# Patient Record
Sex: Male | Born: 1940 | Race: White | Hispanic: No | Marital: Married | Smoking: Former smoker
Health system: Southern US, Community
[De-identification: ages and names within clinical notes are randomized; demographics above are authoritative.]

## PROBLEM LIST (undated history)

## (undated) DIAGNOSIS — E876 Hypokalemia: Secondary | ICD-10-CM

## (undated) DIAGNOSIS — I82409 Acute embolism and thrombosis of unspecified deep veins of unspecified lower extremity: Secondary | ICD-10-CM

## (undated) DIAGNOSIS — M1990 Unspecified osteoarthritis, unspecified site: Secondary | ICD-10-CM

## (undated) DIAGNOSIS — H3530 Unspecified macular degeneration: Secondary | ICD-10-CM

## (undated) DIAGNOSIS — C7951 Secondary malignant neoplasm of bone: Secondary | ICD-10-CM

## (undated) DIAGNOSIS — H269 Unspecified cataract: Secondary | ICD-10-CM

## (undated) DIAGNOSIS — C7910 Secondary malignant neoplasm of unspecified urinary organs: Secondary | ICD-10-CM

## (undated) DIAGNOSIS — K219 Gastro-esophageal reflux disease without esophagitis: Secondary | ICD-10-CM

## (undated) DIAGNOSIS — I10 Essential (primary) hypertension: Secondary | ICD-10-CM

## (undated) DIAGNOSIS — N433 Hydrocele, unspecified: Secondary | ICD-10-CM

## (undated) HISTORY — PX: APPENDECTOMY: SHX54

## (undated) HISTORY — DX: Unspecified macular degeneration: H35.30

## (undated) HISTORY — DX: Hydrocele, unspecified: N43.3

## (undated) HISTORY — PX: OTHER SURGICAL HISTORY: SHX169

## (undated) HISTORY — DX: Hypokalemia: E87.6

## (undated) HISTORY — DX: Secondary malignant neoplasm of bone: C79.51

## (undated) HISTORY — DX: Unspecified cataract: H26.9

## (undated) HISTORY — PX: REPLACEMENT TOTAL KNEE: SUR1224

## (undated) HISTORY — DX: Unspecified osteoarthritis, unspecified site: M19.90

## (undated) HISTORY — PX: HAMMER TOE SURGERY: SHX385

## (undated) HISTORY — DX: Secondary malignant neoplasm of unspecified urinary organs: C79.10

## (undated) HISTORY — DX: Essential (primary) hypertension: I10

## (undated) HISTORY — DX: Gastro-esophageal reflux disease without esophagitis: K21.9

## (undated) HISTORY — DX: Acute embolism and thrombosis of unspecified deep veins of unspecified lower extremity: I82.409

---

## 2005-03-20 ENCOUNTER — Ambulatory Visit: Payer: Self-pay | Admitting: Internal Medicine

## 2005-03-31 ENCOUNTER — Ambulatory Visit: Payer: Self-pay | Admitting: Internal Medicine

## 2005-03-31 HISTORY — PX: COLONOSCOPY: SHX174

## 2009-01-20 ENCOUNTER — Ambulatory Visit (HOSPITAL_COMMUNITY): Admission: RE | Admit: 2009-01-20 | Discharge: 2009-01-20 | Payer: Self-pay | Admitting: Family Medicine

## 2009-06-08 ENCOUNTER — Inpatient Hospital Stay (HOSPITAL_COMMUNITY): Admission: RE | Admit: 2009-06-08 | Discharge: 2009-06-11 | Payer: Self-pay | Admitting: Orthopaedic Surgery

## 2009-06-28 ENCOUNTER — Encounter (HOSPITAL_COMMUNITY): Admission: RE | Admit: 2009-06-28 | Discharge: 2009-07-28 | Payer: Self-pay | Admitting: Orthopaedic Surgery

## 2009-07-29 ENCOUNTER — Encounter (HOSPITAL_COMMUNITY): Admission: RE | Admit: 2009-07-29 | Discharge: 2009-08-28 | Payer: Self-pay | Admitting: Orthopaedic Surgery

## 2010-04-19 ENCOUNTER — Encounter (INDEPENDENT_AMBULATORY_CARE_PROVIDER_SITE_OTHER): Payer: Self-pay | Admitting: *Deleted

## 2010-06-21 NOTE — Letter (Signed)
Summary: Colonoscopy Letter  [facility]  [address] [place], [place] [address]   Phone: [phone]  Fax: [phone]      April 19, 2010 MRN: [id number]   WALI REINHEIMER [address] [place], [place]  [address]   Dear Mr. ELLINGTONJR,   According to your medical record, it is time for you to schedule a Colonoscopy. The American Cancer Society recommends this procedure as a method to detect early colon cancer. Patients with a family history of colon cancer, or a personal history of colon polyps or inflammatory bowel disease are at increased risk.  This letter has been generated based on the recommendations made at the time of your procedure. If you feel that in your particular situation this may no longer apply, please contact our office.  Please call our office at [phone] to schedule this appointment or to update your records at your earliest convenience.  Thank you for cooperating with [place] to provide you with the very best care possible.   Sincerely,   Iva Boop, M.D.  [facility]ology Division [phone]

## 2010-08-07 LAB — BASIC METABOLIC PANEL
CO2: 26 mEq/L (ref 19–32)
Chloride: 100 mEq/L (ref 96–112)
Glucose, Bld: 111 mg/dL — ABNORMAL HIGH (ref 70–99)
Potassium: 3.3 mEq/L — ABNORMAL LOW (ref 3.5–5.1)

## 2010-08-07 LAB — CBC
MCHC: 34.4 g/dL (ref 30.0–36.0)
WBC: 8.1 10*3/uL (ref 4.0–10.5)

## 2010-08-08 LAB — BASIC METABOLIC PANEL
CO2: 27 mEq/L (ref 19–32)
CO2: 29 mEq/L (ref 19–32)
CO2: 29 mEq/L (ref 19–32)
Calcium: 8.1 mg/dL — ABNORMAL LOW (ref 8.4–10.5)
Calcium: 8.2 mg/dL — ABNORMAL LOW (ref 8.4–10.5)
Chloride: 99 mEq/L (ref 96–112)
Creatinine, Ser: 1.09 mg/dL (ref 0.4–1.5)
Creatinine, Ser: 1.31 mg/dL (ref 0.4–1.5)
GFR calc Af Amer: >60 mL/min (ref >60)
GFR calc Af Amer: >60 mL/min (ref >60)
Glucose, Bld: 119 mg/dL — ABNORMAL HIGH (ref 70–99)
Glucose, Bld: 131 mg/dL — ABNORMAL HIGH (ref 70–99)
Potassium: 3.5 mEq/L (ref 3.5–5.1)
Potassium: 3.7 mEq/L (ref 3.5–5.1)
Sodium: 133 mEq/L — ABNORMAL LOW (ref 135–145)
Sodium: 134 mEq/L — ABNORMAL LOW (ref 135–145)

## 2010-08-08 LAB — CBC
HCT: 31.5 % — ABNORMAL LOW (ref 39.0–52.0)
Hemoglobin: 12.2 g/dL — ABNORMAL LOW (ref 13.0–17.0)
MCHC: 33.4 g/dL (ref 30.0–36.0)
MCHC: 34.3 g/dL (ref 30.0–36.0)
Platelets: 254 10*3/uL (ref 150–400)
RDW: 13.7 % (ref 11.5–15.5)
WBC: 10 10*3/uL (ref 4.0–10.5)
WBC: 9.9 10*3/uL (ref 4.0–10.5)

## 2010-08-08 LAB — PROTIME-INR
INR: 1.3 (ref 0.00–1.49)
INR: 1.54 — ABNORMAL HIGH (ref 0.00–1.49)
INR: 1.6 — ABNORMAL HIGH (ref 0.00–1.49)
Prothrombin Time: 18.4 seconds — ABNORMAL HIGH (ref 11.6–15.2)
Prothrombin Time: 18.9 seconds — ABNORMAL HIGH (ref 11.6–15.2)

## 2010-08-23 ENCOUNTER — Ambulatory Visit (HOSPITAL_COMMUNITY): Payer: Self-pay | Admitting: Physical Therapy

## 2010-09-06 ENCOUNTER — Ambulatory Visit (HOSPITAL_COMMUNITY): Payer: Self-pay | Admitting: Physical Therapy

## 2010-11-11 ENCOUNTER — Other Ambulatory Visit (HOSPITAL_COMMUNITY): Payer: Self-pay | Admitting: Family Medicine

## 2010-11-11 DIAGNOSIS — M869 Osteomyelitis, unspecified: Secondary | ICD-10-CM

## 2010-11-15 ENCOUNTER — Ambulatory Visit (HOSPITAL_COMMUNITY)
Admission: RE | Admit: 2010-11-15 | Discharge: 2010-11-15 | Disposition: A | Discharge status: Home / Self Care | Payer: Medicare Other | Source: Ambulatory Visit | Attending: Family Medicine | Admitting: Family Medicine

## 2010-11-15 ENCOUNTER — Other Ambulatory Visit (HOSPITAL_COMMUNITY): Payer: Self-pay | Admitting: Family Medicine

## 2010-11-15 ENCOUNTER — Ambulatory Visit (HOSPITAL_COMMUNITY): Admission: RE | Admit: 2010-11-15 | Payer: Medicare Other | Source: Ambulatory Visit

## 2010-11-15 DIAGNOSIS — M869 Osteomyelitis, unspecified: Secondary | ICD-10-CM

## 2010-11-15 DIAGNOSIS — M79609 Pain in unspecified limb: Secondary | ICD-10-CM | POA: Insufficient documentation

## 2010-11-15 DIAGNOSIS — M7989 Other specified soft tissue disorders: Secondary | ICD-10-CM | POA: Insufficient documentation

## 2010-11-17 ENCOUNTER — Encounter (HOSPITAL_BASED_OUTPATIENT_CLINIC_OR_DEPARTMENT_OTHER)
Admission: RE | Admit: 2010-11-17 | Discharge: 2010-11-17 | Disposition: A | Discharge status: Home / Self Care | Payer: Medicare Other | Source: Ambulatory Visit | Attending: Orthopedic Surgery | Admitting: Orthopedic Surgery

## 2010-11-17 LAB — BASIC METABOLIC PANEL
BUN: 14 mg/dL (ref 6–23)
CO2: 24 mEq/L (ref 19–32)
Calcium: 8 mg/dL — ABNORMAL LOW (ref 8.4–10.5)
Chloride: 104 mEq/L (ref 96–112)
Creatinine, Ser: 1.3 mg/dL (ref 0.50–1.35)
GFR calc Af Amer: >60 mL/min (ref >60)
GFR calc non Af Amer: 55 mL/min — ABNORMAL LOW (ref >60)
Glucose, Bld: 132 mg/dL — ABNORMAL HIGH (ref 70–99)
Potassium: 4.5 mEq/L (ref 3.5–5.1)
Sodium: 137 mEq/L (ref 135–145)

## 2010-11-21 ENCOUNTER — Ambulatory Visit (HOSPITAL_BASED_OUTPATIENT_CLINIC_OR_DEPARTMENT_OTHER)
Admission: RE | Admit: 2010-11-21 | Discharge: 2010-11-21 | Disposition: A | Discharge status: Home / Self Care | Payer: Medicare Other | Source: Ambulatory Visit | Attending: Orthopedic Surgery | Admitting: Orthopedic Surgery

## 2010-11-21 DIAGNOSIS — Z79899 Other long term (current) drug therapy: Secondary | ICD-10-CM | POA: Insufficient documentation

## 2010-11-21 DIAGNOSIS — L02619 Cutaneous abscess of unspecified foot: Secondary | ICD-10-CM | POA: Insufficient documentation

## 2010-11-21 DIAGNOSIS — I10 Essential (primary) hypertension: Secondary | ICD-10-CM | POA: Insufficient documentation

## 2010-11-21 DIAGNOSIS — L97509 Non-pressure chronic ulcer of other part of unspecified foot with unspecified severity: Secondary | ICD-10-CM | POA: Insufficient documentation

## 2010-11-21 DIAGNOSIS — F17200 Nicotine dependence, unspecified, uncomplicated: Secondary | ICD-10-CM | POA: Insufficient documentation

## 2010-11-21 DIAGNOSIS — J4489 Other specified chronic obstructive pulmonary disease: Secondary | ICD-10-CM | POA: Insufficient documentation

## 2010-11-21 DIAGNOSIS — J449 Chronic obstructive pulmonary disease, unspecified: Secondary | ICD-10-CM | POA: Insufficient documentation

## 2010-11-21 DIAGNOSIS — L03039 Cellulitis of unspecified toe: Secondary | ICD-10-CM | POA: Insufficient documentation

## 2010-11-21 DIAGNOSIS — E669 Obesity, unspecified: Secondary | ICD-10-CM | POA: Insufficient documentation

## 2010-11-26 NOTE — Op Note (Signed)
  Harry Franklin, METOYER NO.:  [id number]  MEDICAL RECORD NO.:  [id number]  LOCATION:                                 FACILITY:  PHYSICIAN:  Feliberto Gottron. Turner Daniels, M.D.   DATE OF BIRTH:  January 09, 1941  DATE OF PROCEDURE:  11/21/2010 DATE OF DISCHARGE:                              OPERATIVE REPORT   PREOPERATIVE DIAGNOSIS:  Chronic cellulitis and ulceration, left distal second toe.  POSTOPERATIVE DIAGNOSIS:  Chronic cellulitis and ulceration, left distal second toe.  PROCEDURE:  Left second toe DIP joint amputation.  SURGEON:  Feliberto Gottron. Turner Daniels, MD  FIRST ASSISTANT:  None.  ANESTHETIC:  General LMA.  ESTIMATED BLOOD LOSS:  Minimal.  FLUID REPLACEMENT:  500 mL of crystalloid.  DRAINS PLACED:  None.  TOURNIQUET TIME:  5 minutes.  INDICATIONS FOR PROCEDURE:  A 70 year old man with a left second toe that is about a centimeter longer and the great toe is a chronic distal ulceration with cellulitis and infection and fungal infection of the nail bed for a number of years.  He desires DIP joint amputation to get the toe down of the correct length, incubated of the chronic infections and ulcerations.  Risks and benefits of surgery were discussed, questions were answered.  DESCRIPTION OF PROCEDURE:  The patient was identified by armband, received preoperative Ancef in the holding area at the [facility], taken to operating room #8, appropriate anesthetic monitors were attached.  General LMA anesthesia induced with the patient in supine position.  Left foot was prepped and draped in sterile fashion from the toes to the ankle.  Time-out procedure was performed.  We began the operation by [facility] fashioning a toe tourniquet using the small finger from a #6 glove.  We then cut through the skin dorsally over the DIP joint and made a fishmouth incision performing the DIP joint amputation. The residual tissue was noted to be healthy with no sign of infection and it  looks like the DIP amputation and specimen had all the infection in it.  At this point, the tourniquet was removed, bleeders were cauterized with a bipolar.  Wound was irrigated out with normal saline solution and then closed using 3-0 nylon vertical mattress sutures x3.  A dressing of Xeroform, 4x4 dressing sponges, 1-inch Kerlix and Coban was then applied.  The patient was awakened, extubated, and taken to the recovery room and placed in a postoperative shoe.  He tolerated the procedure well.     Feliberto Gottron. Turner Daniels, M.D.     Ovid Curd  D:  11/21/2010  T:  11/21/2010  Job:  [id number]  Electronically Signed by Gean Birchwood M.D. on 11/26/2010 08:58:10 PM

## 2012-01-23 ENCOUNTER — Encounter: Payer: Self-pay | Admitting: Internal Medicine

## 2012-11-23 ENCOUNTER — Other Ambulatory Visit: Payer: Self-pay | Admitting: Family Medicine

## 2012-11-23 ENCOUNTER — Ambulatory Visit (INDEPENDENT_AMBULATORY_CARE_PROVIDER_SITE_OTHER): Payer: Medicare Other | Admitting: Family Medicine

## 2012-11-23 VITALS — BP 130/72 | HR 98 | Temp 99.0°F | Resp 24 | Ht 76.0 in | Wt 308.0 lb

## 2012-11-23 DIAGNOSIS — H409 Unspecified glaucoma: Secondary | ICD-10-CM

## 2012-11-23 DIAGNOSIS — L97509 Non-pressure chronic ulcer of other part of unspecified foot with unspecified severity: Secondary | ICD-10-CM

## 2012-11-23 DIAGNOSIS — G609 Hereditary and idiopathic neuropathy, unspecified: Secondary | ICD-10-CM

## 2012-11-23 DIAGNOSIS — L97521 Non-pressure chronic ulcer of other part of left foot limited to breakdown of skin: Secondary | ICD-10-CM

## 2012-11-23 MED ORDER — CLINDAMYCIN HCL 150 MG PO CAPS: 150.0000 mg | ORAL_CAPSULE | Freq: Three times a day (TID) | ORAL | Status: DC

## 2012-11-23 MED ORDER — AMOXICILLIN-POT CLAVULANATE 875-125 MG PO TABS: 1.0000 | ORAL_TABLET | Freq: Two times a day (BID) | ORAL | Status: DC

## 2012-11-23 NOTE — Patient Instructions (Signed)
Soak the foot once or twice daily in Epsom salts. Keep it clean and dressed.  See Dr. Everlene Other and your podiatrist back in the next week  Return if worse or if we can be of assistance.  We did do a culture from the foot to make sure it will respond to the antibiotics that we prescribed, and we will let you know the results of that if needed.  Go to your pharmacy and get the antibiotics and continue them. If he gets bad diarrhea stopped the clindamycin immediately. Then followup with your doctor.

## 2012-11-23 NOTE — Progress Notes (Signed)
Subjective: Patient is here with a sore place on the base of the says is a tiny hole in it which is draining a tiny bit. Apparently he has had this in the past and was treated with Augmentin and clindamycin. He saw the doctor a week or so ago who opened a little bit and said that it was healing. He goes to Dr. Everlene Other, as well as to a podiatrist. He denies being diabetic. He does have some loss of sensation in his feet. He is on blood pressure medicine.  Objective: No acute complaints except for the foot. His ankles are edematous. He has a ulceration on his left foot on the ball of the foot. It has a tiny hole in which is draining a tiny bit. Pulses diminished.  The wound was prepped with Betadine. It was debrided, removing some of the surrounding callus. I pared back until 2 holes could be seen. These have a little band of necrosis between the 2. This was all debrided until we had viable bleeding tissue. The actual ulcer was not as large as anticipated it being. It did have a bad odor. Cultures taken. The wound was dressed.  Assessment: Foot ulcer Peripheral neuropathy Probable peripheral vascular disease  Plan: See his primary care Dr. back. See his podiatrist back. Return here if needed. Place him on antibiotics. Results for orders placed in visit on 11/23/12  GLUCOSE, POCT (MANUAL RESULT ENTRY)      Result Value Range   POC Glucose 94  70 - 99 mg/dl  POCT GLYCOSYLATED HEMOGLOBIN (HGB A1C)      Result Value Range   Hemoglobin A1C 5.6

## 2012-11-25 LAB — WOUND CULTURE

## 2012-11-26 LAB — FERRITIN

## 2012-11-26 LAB — IRON AND TIBC

## 2013-01-09 DIAGNOSIS — L97529 Non-pressure chronic ulcer of other part of left foot with unspecified severity: Secondary | ICD-10-CM | POA: Insufficient documentation

## 2013-02-10 ENCOUNTER — Encounter: Payer: Self-pay | Admitting: Podiatrist

## 2013-02-10 DIAGNOSIS — L97529 Non-pressure chronic ulcer of other part of left foot with unspecified severity: Secondary | ICD-10-CM

## 2013-02-20 ENCOUNTER — Encounter: Payer: Self-pay | Admitting: Podiatrist

## 2013-02-20 ENCOUNTER — Ambulatory Visit (INDEPENDENT_AMBULATORY_CARE_PROVIDER_SITE_OTHER): Payer: Medicare Other | Admitting: Podiatrist

## 2013-02-20 VITALS — BP 133/65 | HR 73 | Temp 98.4°F | Resp 20

## 2013-02-20 DIAGNOSIS — L97509 Non-pressure chronic ulcer of other part of unspecified foot with unspecified severity: Secondary | ICD-10-CM

## 2013-02-20 DIAGNOSIS — L97529 Non-pressure chronic ulcer of other part of left foot with unspecified severity: Secondary | ICD-10-CM

## 2013-02-20 NOTE — Progress Notes (Signed)
Subjective:  Patient presents today for continued care of ulceration of left foot- submetatarsal 1.  Patient denies any new complaints.  Denies nausea, vomiting, fevers or chills.  Denies changes to ulceration.  Patient relates ulceration has been looking better to him however it is starting to be painful. Has not been applying topical medication to wound.  Has been wearing his tennis shoe with offloading insert  Objective:  Ulceration located submetarsal 1 left foot.  Measurements carried out today of 7mm x 5mm x 3mm depth. Red, granlar base noted post debridement. No redness, streaking or lymphingitis noted.  No probing to bone, no undermining, no active pus or pirulence noted.    Assessment:  Ulceration left submetatarsal 1- chronic remitting/relapsing  Plan: Discussed etiology, pathology, conservative vs. Surgical therapies and at this time office debridement was recommended  Ulcer was debrided and reactive hyperkeratoses and necrotic tissue was resected to the level of bleeding or viable tissue. No deep abscess, no erythema, no edema, no cellulitis, no odor was encountered.  Iodosorb and a sterile dressing was applied.  Patient was given instructions on offloading and dressing change/aftercare and was instructed to call immediately if any signs or symptoms of infection arise.

## 2013-02-20 NOTE — Patient Instructions (Signed)
Your ulcer has not completely healed.  Wear your air fracture boot/walker on your left foot when your foot is going to be on the floor or bearing weight.  Apply Iodosorb to your ulcer and cover with a dressing every day   Instructions for Wound Care  The most important step to healing a foot wound is to reduce the pressure on your foot - it is extremely important to stay off your foot as much as possible and wear the shoe/boot as instructed.  Cleanse your foot with saline wash or warm soapy water (dial antibacterial soap or similar).  Blot dry.  Apply prescribed medication to your wound and cover with gauze and a bandage.  May hold bandage in place with Coban (self sticky wrap), Ace bandage or tape.  You may find dressing supplies at your local [employer], [employer], drug store or medical supply store.  Your prescribed topical medication is :  Lodosorb Gel (once or twice daily depending on drainage)   If you notice any foul odor, increase in pain, pus, increased swelling, red streaks or generalized redness occurring in your foot or leg-Call our office immediately to be seen.  This may be a sign of a limb or life threatening infection that will need prompt attention.  Marlowe Aschoff, DPM  [facility]  [phone] [facility]

## 2013-03-13 ENCOUNTER — Encounter: Payer: Self-pay | Admitting: Podiatrist

## 2013-03-13 ENCOUNTER — Ambulatory Visit (INDEPENDENT_AMBULATORY_CARE_PROVIDER_SITE_OTHER): Payer: Medicare Other | Admitting: Podiatrist

## 2013-03-13 VITALS — BP 147/84 | HR 75 | Temp 99.2°F | Resp 20

## 2013-03-13 DIAGNOSIS — L97509 Non-pressure chronic ulcer of other part of unspecified foot with unspecified severity: Secondary | ICD-10-CM

## 2013-03-13 DIAGNOSIS — L97529 Non-pressure chronic ulcer of other part of left foot with unspecified severity: Secondary | ICD-10-CM

## 2013-03-13 NOTE — Patient Instructions (Signed)
Continue taking your antibiotic medications.  Continue using iodosorb  On your foot.  Call if you notice any further signs of infections!

## 2013-03-19 NOTE — Progress Notes (Signed)
Almir presents today for followup of ulceration sub-1 left foot. The area has remitting and relapsing in the past. Patient was recently in [place] and the foot became infected. He ended up at the hospital where he was put on antibiotics. He has been using Iodosorb and dressing the foot as well as staying off of it.  Objective. Her vascular status is unchanged. He does have an ulceration submetatarsal one of the left foot which has returned. No pus or purulence is seen at today's visit. No redness or streaking he can is noted. He does however have an open lesion measuring 4 mm in diameter 2 mm in depth. It has a red granular base and does appear to be healthy however it is continued to be present.  Assessment recurrent ulceration submetatarsal one  Plan: Removed all necrotic tissue and applied Iodosorb and a dry sterile compressive dressing. Discussed importance of staying off of this foot to allow it to heal. I will see him back in 2 weeks for followup if any problems arise prior to that visit he is instructed to call me immediately

## 2013-03-28 ENCOUNTER — Encounter: Payer: Self-pay | Admitting: Podiatrist

## 2013-03-28 ENCOUNTER — Ambulatory Visit (INDEPENDENT_AMBULATORY_CARE_PROVIDER_SITE_OTHER): Payer: Medicare Other | Admitting: Podiatrist

## 2013-03-28 VITALS — BP 156/82 | HR 73 | Temp 97.2°F | Resp 20 | Ht 77.0 in | Wt 310.0 lb

## 2013-03-28 DIAGNOSIS — L97509 Non-pressure chronic ulcer of other part of unspecified foot with unspecified severity: Secondary | ICD-10-CM

## 2013-03-28 DIAGNOSIS — L97529 Non-pressure chronic ulcer of other part of left foot with unspecified severity: Secondary | ICD-10-CM

## 2013-03-28 NOTE — Patient Instructions (Signed)
Continue using the iodosorb and using the wedge shoe.

## 2013-03-28 NOTE — Progress Notes (Signed)
Subjective: Harry Franklin presents today for followup of ulceration submetatarsal one of the left foot. He states is not hurting as long as he is in his wedge shoe. He has been applying Iodosorb as instructed. He denies any systemic signs of infection.  Objective: Prominent plantarflexed first metatarsal continues to be present with ulceration submetatarsal one left. Ulceration appears to be healing nicely no deep abscess is palpated. There is a slitlike ulceration present measuring 4 mm x 1 mm and it has a red granular base present. No infection noted no streaking or lymphangitis present.  Assessment: Ulceration submetatarsal one left  Plan: Debrided the necrotic tissue and applied Iodosorb and a dry sterile compressive dressing. Instructed the patient to continue in his wedge shoe and he is instructed to bring his tissue at the next visit. Also discussed [place] which history of is coming up in 12 days. He will read the scooter and use his wedge shoe during the trip. I will see him back in 5 days for debridement and to make sure everything was looking good. May want to prescribe him antibiotics just in case he needs it for his trip

## 2013-04-02 ENCOUNTER — Ambulatory Visit (INDEPENDENT_AMBULATORY_CARE_PROVIDER_SITE_OTHER): Payer: Medicare Other | Admitting: Podiatrist

## 2013-04-02 ENCOUNTER — Encounter: Payer: Self-pay | Admitting: Podiatrist

## 2013-04-02 VITALS — BP 138/78 | HR 83 | Resp 20

## 2013-04-02 DIAGNOSIS — L97509 Non-pressure chronic ulcer of other part of unspecified foot with unspecified severity: Secondary | ICD-10-CM

## 2013-04-02 DIAGNOSIS — L97529 Non-pressure chronic ulcer of other part of left foot with unspecified severity: Secondary | ICD-10-CM

## 2013-04-02 NOTE — Progress Notes (Signed)
Subjective: Harry Franklin presents today for followup of ulceration submetatarsal one of the left foot. He states is not hurting as long as he is in his wedge shoe. He has been applying Iodosorb as instructed. He denies any systemic signs of infection.  Objective: Prominent plantarflexed first metatarsal continues to be present with ulceration submetatarsal one left. Ulceration appears to be healing nicely . There is a slitlike ulceration present measuring 2 mm x 1 mm and it has a red granular base present. No infection noted no streaking or lymphangitis present.  Assessment: Ulceration submetatarsal one left  Plan: Debrided the necrotic tissue and applied Iodosorb and a dry sterile compressive dressing. Instructed the patient to continue in his wedge shoe.  I also offloaded his inserts for his athletic shoes. He will continue following wound care regimen. He will be seen back in 2 weeks for followup.

## 2013-04-02 NOTE — Patient Instructions (Signed)
Continue applying the iodosorb and a dressing as well as wearing your offloading shoe to [employer].  Watch for any redness or swelling but today it looks to be healing well!

## 2013-04-21 ENCOUNTER — Encounter: Payer: Self-pay | Admitting: Podiatrist

## 2013-04-25 ENCOUNTER — Ambulatory Visit (INDEPENDENT_AMBULATORY_CARE_PROVIDER_SITE_OTHER): Payer: Medicare Other | Admitting: Podiatrist

## 2013-04-25 ENCOUNTER — Encounter: Payer: Self-pay | Admitting: Podiatrist

## 2013-04-25 VITALS — BP 142/79 | HR 67 | Temp 96.3°F | Resp 24 | Ht 77.0 in | Wt 307.0 lb

## 2013-04-25 DIAGNOSIS — L97509 Non-pressure chronic ulcer of other part of unspecified foot with unspecified severity: Secondary | ICD-10-CM

## 2013-04-25 DIAGNOSIS — L97529 Non-pressure chronic ulcer of other part of left foot with unspecified severity: Secondary | ICD-10-CM

## 2013-04-25 NOTE — Progress Notes (Signed)
Subjective: Harry Franklin presents today for followup of ulceration submetatarsal one of the left foot. He states he went [place] and on the first night there he developed a fever and chills.  He was seen at the hospital there and was admitted for iv antibiotics for 4 days and oral antibiotics on discharge.  A CT scan was done which showed no evidence of bone infection.  He continues to have a small ulceration which will heal, then open back up and become infected with cellulitus.  He states he has been wearing his wedge shoe or his cam walker at all times.  Today he presents in a sneaker with accomidative insert.  He has been applying Iodosorb as instructed.   Objective: Prominent plantarflexed first metatarsal continues to be present with ulceration submetatarsal one left. Ulceration appears to be present and relapsed at 29mmx3mmx3mm depth.  No probing to bone noted.  No undermining, no drainage or odor, no abscess identified.    Assessment: Ulceration submetatarsal one left -- non healing with episodic cellulitus  Plan: Debrided the necrotic tissue and applied Iodosorb and a dry sterile compressive dressing. Instructed the patient to continue in his boot or wedge shoe at all times. I recommended an appointment with the wound center to see if they may be able to assist with healing.  A secondary option is to do surgery on the foot to dorsiflex the first metatarsal however, the ulcer would need to be healed in order for this to occur.  He will continue following wound care regimen. He will be seen back in 1 week  for follow-up unless he see's the wound center prior to that visit date.  Marlowe Aschoff, DPM

## 2013-04-29 ENCOUNTER — Telehealth: Payer: Self-pay | Admitting: *Deleted

## 2013-04-29 MED ORDER — CADEXOMER IODINE 0.9 % EX GEL: 1.0000 "application " | Freq: Every day | CUTANEOUS | Status: DC | PRN

## 2013-04-29 NOTE — Telephone Encounter (Signed)
Dr Irving Shows ordered refill pt's Iodosorb gel through [facility].  Faxed to [facility] [phone] with 5 additional refills.

## 2013-05-02 ENCOUNTER — Encounter: Payer: Self-pay | Admitting: Podiatrist

## 2013-05-02 ENCOUNTER — Ambulatory Visit (INDEPENDENT_AMBULATORY_CARE_PROVIDER_SITE_OTHER): Payer: Medicare Other

## 2013-05-02 ENCOUNTER — Ambulatory Visit (INDEPENDENT_AMBULATORY_CARE_PROVIDER_SITE_OTHER): Payer: Medicare Other | Admitting: Podiatrist

## 2013-05-02 VITALS — BP 159/88 | HR 79 | Resp 12

## 2013-05-02 DIAGNOSIS — M79672 Pain in left foot: Secondary | ICD-10-CM

## 2013-05-02 DIAGNOSIS — M216X9 Other acquired deformities of unspecified foot: Secondary | ICD-10-CM

## 2013-05-02 DIAGNOSIS — M2030 Hallux varus (acquired), unspecified foot: Secondary | ICD-10-CM

## 2013-05-02 DIAGNOSIS — M6240 Contracture of muscle, unspecified site: Secondary | ICD-10-CM

## 2013-05-02 DIAGNOSIS — L97529 Non-pressure chronic ulcer of other part of left foot with unspecified severity: Secondary | ICD-10-CM

## 2013-05-02 DIAGNOSIS — M79609 Pain in unspecified limb: Secondary | ICD-10-CM

## 2013-05-02 DIAGNOSIS — L97509 Non-pressure chronic ulcer of other part of unspecified foot with unspecified severity: Secondary | ICD-10-CM

## 2013-05-02 DIAGNOSIS — M2032 Hallux varus (acquired), left foot: Secondary | ICD-10-CM

## 2013-05-02 DIAGNOSIS — M216X2 Other acquired deformities of left foot: Secondary | ICD-10-CM

## 2013-05-02 NOTE — Patient Instructions (Signed)
Pre-Operative Instructions  Congratulations, you have decided to take an important step to improving your quality of life.  You can be assured that the doctors of [facility] will be with you every step of the way.  1. Plan to be at the surgery center/hospital at least 1 (one) hour prior to your scheduled time unless otherwise directed by the surgical center/hospital staff.  You must have a responsible adult accompany you, remain during the surgery and drive you home.  Make sure you have directions to the surgical center/hospital and know how to get there on time. 2. For hospital based surgery you will need to obtain a history and physical form from your family physician within 1 month prior to the date of surgery- we will give you a form for you primary physician.  3. We make every effort to accommodate the date you request for surgery.  There are however, times where surgery dates or times have to be moved.  We will contact you as soon as possible if a change in schedule is required.   4. No Aspirin/Ibuprofen for one week before surgery.  If you are on aspirin, any non-steroidal anti-inflammatory medications (Mobic, Aleve, Ibuprofen) you should stop taking it 7 days prior to your surgery.  You make take Tylenol  For pain prior to surgery.  5. Medications- If you are taking daily heart and blood pressure medications, seizure, reflux, allergy, asthma, anxiety, pain or diabetes medications, make sure the surgery center/hospital is aware before the day of surgery so they may notify you which medications to take or avoid the day of surgery. 6. No food or drink after midnight the night before surgery unless directed otherwise by surgical center/hospital staff. 7. No alcoholic beverages 24 hours prior to surgery.  No smoking 24 hours prior to or 24 hours after surgery. 8. Wear loose pants or shorts- loose enough to fit over bandages, boots, and casts. 9. No slip on shoes, sneakers are best. 10. Bring  your boot with you to the surgery center/hospital.  Also bring crutches or a walker if your physician has prescribed it for you.  If you do not have this equipment, it will be provided for you after surgery. 11. If you have not been contracted by the surgery center/hospital by the day before your surgery, call to confirm the date and time of your surgery. 12. Leave-time from work may vary depending on the type of surgery you have.  Appropriate arrangements should be made prior to surgery with your employer. 13. Prescriptions will be provided immediately following surgery by your doctor.  Have these filled as soon as possible after surgery and take the medication as directed. 14. Remove nail polish on the operative foot. 15. Wash the night before surgery.  The night before surgery wash the foot and leg well with the antibacterial soap provided and water paying special attention to beneath the toenails and in between the toes.  Rinse thoroughly with water and dry well with a towel.  Perform this wash unless told not to do so by your physician.  Enclosed: 1 Ice pack (please put in freezer the night before surgery)   1 Hibiclens skin cleaner   Pre-op Instructions  If you have any questions regarding the instructions, do not hesitate to call our office.  [place]: [address] [place], [place] [address] [phone]  [place]: [address] [place], [place] [address] [phone]  [place]: [address]  [place], [place] [address] [phone]  Dr. Richard   Tuchman DPM, Dr. Norman Regal DPM Dr. Richard Sikora DPM, Dr. M. Todd Hyatt DPM, Dr. Mitsy Owen DPM 

## 2013-05-02 NOTE — Progress Notes (Signed)
   Subjective:    Patient ID: Harry Franklin, male    DOB: 1941/04/04, 72 y.o.   MRN: [id number]  HPI Comments: '' LT FOOT IS A LITTLE SORE TODAY.''  Harry Franklin presents today for followup of ulceration submetatarsal of the left foot. He said that the little sore today. He denies any nausea, vomiting, fevers, chills, night sweats or signs of infection. He states he would like to get this foot fixed as soon as possible as his had 3 episodes where he's had to go to the hospital and he does not want to get this foot infected again.   Review of Systems     Objective:   Physical Exam  Neurovascular status unchanged with palpable pedal pulses and decreased neurological sensation to the plantar aspect of the left foot. The patient has prominent plantarflexed first metatarsal which is flexible in nature. 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 Minimal ulceration present measuring 2 mm in diameter with a red granular base is noted. No redness, no swelling, no streaking, no malodor, no signs of infection are present. Ulcer appears to be healing rapidly.      Assessment & Plan:  Assessment: Ulceration submetatarsal one left., Prominent plantarflexed first metatarsal, hallux malleus left, contracture of tendon left  Plan: The ulceration was debrided from all necrotic tissue. No sign of infection present. Iodosorb and addressed a compressive dressing applied. Short air fracture walker dispensed. Discussed surgical intervention including a dorsiflexed ray osteotomy and fusion of the first metatarsocuneiform joint. Also discussed fusion of the hallux as well as extensor tendon release left. I reviewed the x-rays with the patient and discussed with him the proposed surgery. I did however discuss that if the ulceration is still present we cannot move forward with the surgery. He will wear the short air fracture walker consistently and I will see him next  week to make sure the ulceration is stable. I also discussed with him the consent forms to have surgery at [facility] specialty surgery center. The patients questions were encouraged and answered to the best of my ability. Again he'll be seen back in one week for recheck of the foot to ensure that we can do surgery for him that Monday.  Harry Franklin DPM

## 2013-05-09 ENCOUNTER — Ambulatory Visit (INDEPENDENT_AMBULATORY_CARE_PROVIDER_SITE_OTHER): Payer: Medicare Other | Admitting: Podiatrist

## 2013-05-09 ENCOUNTER — Encounter: Payer: Self-pay | Admitting: Podiatrist

## 2013-05-09 VITALS — BP 160/88 | HR 78 | Temp 97.1°F | Resp 24 | Ht 77.0 in | Wt 320.0 lb

## 2013-05-09 DIAGNOSIS — M216X2 Other acquired deformities of left foot: Secondary | ICD-10-CM

## 2013-05-09 DIAGNOSIS — M216X9 Other acquired deformities of unspecified foot: Secondary | ICD-10-CM

## 2013-05-12 ENCOUNTER — Encounter: Payer: Self-pay | Admitting: Podiatrist

## 2013-05-12 DIAGNOSIS — M21549 Acquired clubfoot, unspecified foot: Secondary | ICD-10-CM

## 2013-05-12 DIAGNOSIS — M6240 Contracture of muscle, unspecified site: Secondary | ICD-10-CM

## 2013-05-12 DIAGNOSIS — M2030 Hallux varus (acquired), unspecified foot: Secondary | ICD-10-CM

## 2013-05-13 NOTE — Progress Notes (Signed)
Presents today for preoperative check of left foot prior to his scheduled surgery on Monday. He denies any changes in the foot.  Objective: Vascular status continues to be intact and he has significant decrease in sensation left. Ulceration appears to be healing nicely no redness, no swelling, no signs of infection are present.  Assessment: Preoperative check for ulcer healing prior to elective surgery  Plan: Discussed with Jade that he does need to continue wearing his boot through the weekend. He was.also given my phone number and I instructed him that if he develops any signs or symptoms of infection he is to call. We will proceed with the proposed surgery on Monday however I did discuss with him that I suspect any cellulitis or infectious process present we will cancel the surgery and wait Patient demonstrates an understanding of this conversation

## 2013-05-21 ENCOUNTER — Encounter: Payer: Self-pay | Admitting: Podiatrist

## 2013-05-21 ENCOUNTER — Ambulatory Visit (INDEPENDENT_AMBULATORY_CARE_PROVIDER_SITE_OTHER): Payer: Medicare Other | Admitting: Podiatrist

## 2013-05-21 ENCOUNTER — Ambulatory Visit (INDEPENDENT_AMBULATORY_CARE_PROVIDER_SITE_OTHER): Payer: Medicare Other

## 2013-05-21 VITALS — BP 145/86 | HR 82 | Resp 16

## 2013-05-21 DIAGNOSIS — Z9889 Other specified postprocedural states: Secondary | ICD-10-CM

## 2013-05-21 NOTE — Patient Instructions (Signed)
Your foot looks great-- continue keeping your foot dry and keep the dressing intact.  Call if you have any questions or complaints

## 2013-05-23 ENCOUNTER — Ambulatory Visit: Payer: Medicare Other | Admitting: Podiatrist

## 2013-05-23 NOTE — Progress Notes (Signed)
Subjective: Harry Franklin presents today one week status post surgery on his left foot. Date of surgery 05/12/2013. Patient relates he's been doing well, denies any nausea, vomiting, fevers, chills or night sweats. Denies any calf or leg pain or tenderness. States he's been wearing his boot as instructed. He denies any pain or tenderness to the foot. Has not required any pain medication in the last 3 days.  Objective: Excellent clinical appearance of the foot is seen. Incision sites are well coapted with sutures in place. No redness, no swelling, no streaking, no lymphangitis, no sign of infection present. Clinical appearance of the foot is also excellent. Ulceration appears healing submetatarsal one left.  Assessment: Status post foot surgery left 05/12/2013- dorsiflexory first metatarsal osteotomy with screw fixation, hallux IP fusion, extensor tendon lengthening.  Plan: Redressed the foot and a dry sterile compressive dressing. Instructed the patient to continue taking his antibiotic until finished. Instructed the patient to remove his foot from the boot and to do calf exercises at least 4 times daily. A stretcher the patient is day off of the foot as much as possible and to avoid weightbearing as much as possible. We'll see him back in one more week in sutures will be removed at that visit. If he has any problems or concerns or if he notices any redness, swelling or any indication that an infection or cellulitis may be present he is instructed to call me immediately.

## 2013-05-26 ENCOUNTER — Telehealth: Payer: Self-pay | Admitting: *Deleted

## 2013-05-26 NOTE — Telephone Encounter (Signed)
Pt states called Dr Valentina Lucks on Sunday after having severe pain that previous night, took 3 pain pill over the night.  He states Dr Valentina Lucks stated unwrap the foot and check for infection.  Pt states had no redness, or drainage, Dr Valentina Lucks ordered begin Warm Epsom salt soaks daily.  Pt states this morning, the big toe has a small area of redness, no fever or drainage.  I instructed pt to continue the antibiotic given and soaks, place a small amount of Neosporin ointment on the site after the soak.  I will advise Dr Valentina Lucks and call with any new orders.  I instructed pt to call again with concerns.  Pt states understanding.

## 2013-05-27 NOTE — Progress Notes (Signed)
1) Lapidus bunionectomy left foot  2) Halux IPJ fusion left foot 3) Possible extensor tendon lengthening (great toe) left foot

## 2013-05-29 ENCOUNTER — Encounter: Payer: Self-pay | Admitting: Podiatrist

## 2013-05-29 ENCOUNTER — Ambulatory Visit (INDEPENDENT_AMBULATORY_CARE_PROVIDER_SITE_OTHER): Payer: Medicare HMO | Admitting: Podiatrist

## 2013-05-29 ENCOUNTER — Ambulatory Visit (INDEPENDENT_AMBULATORY_CARE_PROVIDER_SITE_OTHER): Payer: Medicare HMO

## 2013-05-29 VITALS — BP 157/84 | HR 75 | Resp 20

## 2013-05-29 DIAGNOSIS — R52 Pain, unspecified: Secondary | ICD-10-CM

## 2013-05-29 MED ORDER — CEPHALEXIN 500 MG PO CAPS: 500.0000 mg | ORAL_CAPSULE | Freq: Three times a day (TID) | ORAL | Status: DC

## 2013-05-29 NOTE — Progress Notes (Signed)
Subjective: Harry Franklin presents today for 3 weeks postop check status post left foot surgery. He states over the weekend his foot became severely painful and he ended up taking more pain medication than he had taken right after surgery. He states he had redness of the great toe which has since subsided. He states he's been wearing his postop boot as instructed and he's seen no redness, no swelling, no pus, no purulence, and no calf pain or tenderness is noted.  Objective: Pedal pulses continue to be palpable and present left. Neurological sensation continues to be decreased as per his baseline left. Incision sites are well coapted with sutures in place that are removed at today's visit without complication. Swelling at the hallux is noted alignment and position of the hallux is within normal limits. Overall the foot looks great postoperatively. The ulceration still appears healed and no sign of infection is present.  X-rays are taken at today's visit and are normal. No lucency around the hallux IP joint screw and no bony changes are seen.   Assessment: Status post left foot surgery 3 weeks postop  Plan: Removed the sutures today without complication. Dispensed surgi-grip for the left foot instructions for wear. I will see him back. In 2 weeks and we will reevaluate the foot. At that time I will decide if he is ready for her Darco shoe or not any problems or concerns arise in the meantime he is instructed to contact me immediately.

## 2013-05-29 NOTE — Patient Instructions (Signed)
Keep wearing your boot--  You can get your foot wet but use a shower chair in the shower to keep weight off the toe area.  I'll see you again in 2 weeks.. Call if you have any trouble before then

## 2013-06-13 ENCOUNTER — Ambulatory Visit (INDEPENDENT_AMBULATORY_CARE_PROVIDER_SITE_OTHER): Payer: Medicare HMO | Admitting: Podiatrist

## 2013-06-13 ENCOUNTER — Encounter: Payer: Self-pay | Admitting: Podiatrist

## 2013-06-13 ENCOUNTER — Ambulatory Visit (INDEPENDENT_AMBULATORY_CARE_PROVIDER_SITE_OTHER): Payer: Medicare HMO

## 2013-06-13 VITALS — BP 160/90 | HR 81 | Resp 18

## 2013-06-13 DIAGNOSIS — Z9889 Other specified postprocedural states: Secondary | ICD-10-CM

## 2013-06-13 DIAGNOSIS — M216X2 Other acquired deformities of left foot: Secondary | ICD-10-CM

## 2013-06-13 DIAGNOSIS — M6240 Contracture of muscle, unspecified site: Secondary | ICD-10-CM

## 2013-06-13 DIAGNOSIS — Z09 Encounter for follow-up examination after completed treatment for conditions other than malignant neoplasm: Secondary | ICD-10-CM

## 2013-06-13 DIAGNOSIS — M216X9 Other acquired deformities of unspecified foot: Secondary | ICD-10-CM

## 2013-06-13 NOTE — Patient Instructions (Signed)
Wear your Darco (smaller shoe) when at home or a protected place.  If you are out and about and/or doing a significant amount of walking, wear your large boot still.  I'll see you again in 1 month.  If you have any troubles let me know!

## 2013-06-13 NOTE — Progress Notes (Signed)
Subjective: Harry Franklin presents today for his 2 month postop followup stating "I am doing good on my left foot and it has been close to 2 months now and the surgery was done on 05/12/13 and now I have spot on top of my foot that was not there before" he has a small abrasion on the dorsal aspect of the left foot most likely from the boot.  Objective: Excellent clinical appearance of the left foot is seen. Redness and swelling are completely subsided. The ulceration is healed on the plantar aspect of the left first metatarsal head. Overall excellent improvement on the left foot is seen both clinically and radiographically.  Assessment: Status post left foot surgery date of surgery 05/12/2013  Plan: Harry Franklin was put into a Darco shoe at today's visit. He was told to discontinue the use of the cephalexin. I will see him back in one month for followup. At that time hopefully he'll be ready for a tennis shoe. If he experiences any problems or concerns he is instructed to call.

## 2013-06-30 ENCOUNTER — Emergency Department (HOSPITAL_COMMUNITY)
Admission: EM | Admit: 2013-06-30 | Discharge: 2013-06-30 | Disposition: A | Discharge status: Home / Self Care | Payer: Medicare HMO | Attending: Emergency Medicine | Admitting: Emergency Medicine

## 2013-06-30 ENCOUNTER — Emergency Department (HOSPITAL_COMMUNITY): Payer: Medicare HMO

## 2013-06-30 ENCOUNTER — Encounter (HOSPITAL_COMMUNITY): Payer: Self-pay | Admitting: Emergency Medicine

## 2013-06-30 DIAGNOSIS — I10 Essential (primary) hypertension: Secondary | ICD-10-CM | POA: Insufficient documentation

## 2013-06-30 DIAGNOSIS — Z87891 Personal history of nicotine dependence: Secondary | ICD-10-CM | POA: Insufficient documentation

## 2013-06-30 DIAGNOSIS — M129 Arthropathy, unspecified: Secondary | ICD-10-CM | POA: Insufficient documentation

## 2013-06-30 DIAGNOSIS — R000 Tachycardia, unspecified: Secondary | ICD-10-CM | POA: Insufficient documentation

## 2013-06-30 DIAGNOSIS — K219 Gastro-esophageal reflux disease without esophagitis: Secondary | ICD-10-CM | POA: Insufficient documentation

## 2013-06-30 DIAGNOSIS — L819 Disorder of pigmentation, unspecified: Secondary | ICD-10-CM | POA: Insufficient documentation

## 2013-06-30 DIAGNOSIS — Z7982 Long term (current) use of aspirin: Secondary | ICD-10-CM | POA: Insufficient documentation

## 2013-06-30 DIAGNOSIS — R609 Edema, unspecified: Secondary | ICD-10-CM | POA: Insufficient documentation

## 2013-06-30 DIAGNOSIS — J111 Influenza due to unidentified influenza virus with other respiratory manifestations: Secondary | ICD-10-CM

## 2013-06-30 DIAGNOSIS — E876 Hypokalemia: Secondary | ICD-10-CM | POA: Insufficient documentation

## 2013-06-30 DIAGNOSIS — Z791 Long term (current) use of non-steroidal anti-inflammatories (NSAID): Secondary | ICD-10-CM | POA: Insufficient documentation

## 2013-06-30 DIAGNOSIS — R69 Illness, unspecified: Secondary | ICD-10-CM

## 2013-06-30 DIAGNOSIS — R509 Fever, unspecified: Secondary | ICD-10-CM

## 2013-06-30 DIAGNOSIS — Z792 Long term (current) use of antibiotics: Secondary | ICD-10-CM | POA: Insufficient documentation

## 2013-06-30 DIAGNOSIS — Z79899 Other long term (current) drug therapy: Secondary | ICD-10-CM | POA: Insufficient documentation

## 2013-06-30 DIAGNOSIS — H269 Unspecified cataract: Secondary | ICD-10-CM | POA: Insufficient documentation

## 2013-06-30 LAB — CBC WITH DIFFERENTIAL/PLATELET
BASOS ABS: 0 10*3/uL (ref 0.0–0.1)
BASOS PCT: 0 % (ref 0–1)
Eosinophils Absolute: 0.1 10*3/uL (ref 0.0–0.7)
Eosinophils Relative: 1 % (ref 0–5)
HEMATOCRIT: 41 % (ref 39.0–52.0)
Hemoglobin: 13.8 g/dL (ref 13.0–17.0)
Lymphocytes Relative: 7 % — ABNORMAL LOW (ref 12–46)
Lymphs Abs: 1 10*3/uL (ref 0.7–4.0)
MCH: 30.8 pg (ref 26.0–34.0)
MCHC: 33.7 g/dL (ref 30.0–36.0)
MCV: 91.5 fL (ref 78.0–100.0)
MONOS PCT: 7 % (ref 3–12)
Monocytes Absolute: 0.9 10*3/uL (ref 0.1–1.0)
NEUTROS ABS: 11 10*3/uL — AB (ref 1.7–7.7)
Neutrophils Relative %: 85 % — ABNORMAL HIGH (ref 43–77)
Platelets: 228 10*3/uL (ref 150–400)
RBC: 4.48 MIL/uL (ref 4.22–5.81)
RDW: 12.9 % (ref 11.5–15.5)
WBC: 13 10*3/uL — ABNORMAL HIGH (ref 4.0–10.5)

## 2013-06-30 LAB — URINE MICROSCOPIC-ADD ON

## 2013-06-30 LAB — URINALYSIS, ROUTINE W REFLEX MICROSCOPIC
BILIRUBIN URINE: NEGATIVE
GLUCOSE, UA: NEGATIVE mg/dL
KETONES UR: NEGATIVE mg/dL
Leukocytes, UA: NEGATIVE
Nitrite: NEGATIVE
PROTEIN: NEGATIVE mg/dL
Specific Gravity, Urine: 1.02 (ref 1.005–1.030)
Urobilinogen, UA: 0.2 mg/dL (ref 0.0–1.0)
pH: 5.5 (ref 5.0–8.0)

## 2013-06-30 LAB — INFLUENZA PANEL BY PCR (TYPE A & B)
H1N1 flu by pcr: NOT DETECTED
Influenza A By PCR: NEGATIVE
Influenza B By PCR: NEGATIVE

## 2013-06-30 LAB — BASIC METABOLIC PANEL
BUN: 21 mg/dL (ref 6–23)
CHLORIDE: 102 meq/L (ref 96–112)
CO2: 22 mEq/L (ref 19–32)
CREATININE: 1.45 mg/dL — AB (ref 0.50–1.35)
Calcium: 8.5 mg/dL (ref 8.4–10.5)
GFR calc Af Amer: 55 mL/min — ABNORMAL LOW (ref >90)
GFR calc non Af Amer: 47 mL/min — ABNORMAL LOW (ref >90)
Glucose, Bld: 107 mg/dL — ABNORMAL HIGH (ref 70–99)
Potassium: 3.8 mEq/L (ref 3.7–5.3)
Sodium: 138 mEq/L (ref 137–147)

## 2013-06-30 MED ORDER — OSELTAMIVIR PHOSPHATE 75 MG PO CAPS: 75.0000 mg | ORAL_CAPSULE | Freq: Two times a day (BID) | ORAL | Status: DC

## 2013-06-30 MED ORDER — ACETAMINOPHEN 500 MG PO TABS
1000.0000 mg | ORAL_TABLET | Freq: Once | ORAL | Status: AC
  Administered 2013-06-30: 1000 mg via ORAL
  Filled 2013-06-30: qty 2

## 2013-06-30 MED ORDER — NAPROXEN 500 MG PO TABS: 500.0000 mg | ORAL_TABLET | Freq: Two times a day (BID) | ORAL | Status: DC

## 2013-06-30 NOTE — Discharge Instructions (Signed)
Please call your doctor for a followup appointment within 24-48 hours. When you talk to your doctor please let them know that you were seen in the emergency department and have them acquire all of your records so that they can discuss the findings with you and formulate a treatment plan to fully care for your new and ongoing problems.  Take 500mg  of naprosyn twice daily for bodyaches and fevers  Take tylenol every 8 hours for bodyaches and fevers - this can be used with the naprosyn safely.

## 2013-06-30 NOTE — ED Provider Notes (Signed)
CSN: [id number]     Arrival date & time 06/30/13  0515 History   First MD Initiated Contact with Patient 06/30/13 0531     Chief Complaint  Patient presents with  . Fever    102 fever at home.  . Generalized Body Aches   (Consider location/radiation/quality/duration/timing/severity/associated sxs/prior Treatment) HPI Comments: 73 year old male with a history of hypertension, history of recent podiatry surgery to the bottom of the left foot for a nonhealing ulcer. He presents with a complaint of a fever, this has begun in the last 12 hours, is persistent, associated with myalgias, intermittent cough and nasal congestion but denies nausea vomiting dysuria or diarrhea today. He did have diarrhea over the weekend but this has resolved. He has had no medications prior to arrival. He does note that he recently had a cellulitis of his lower extremity which has healed and he feels that his skin is back to its baseline color. He did receive a flu shot this year. He describes as myalgias as diffuse, involving the neck, shoulders, back, legs.  Patient is a 73 y.o. male presenting with fever. The history is provided by the patient and a relative.  Fever   Past Medical History  Diagnosis Date  . GERD (gastroesophageal reflux disease)   . Hypokalemia   . Hypertension   . Cataract   . Arthritis    Past Surgical History  Procedure Laterality Date  . Replacement total knee    . Appendectomy    . Hammer toe surgery Left    History reviewed. No pertinent family history. History  Substance Use Topics  . Smoking status: Former Smoker    Quit date: 01/22/1984  . Smokeless tobacco: Not on file  . Alcohol Use: No    Review of Systems  Constitutional: Positive for fever.  All other systems reviewed and are negative.    Allergies  Vancomycin  Home Medications   Current Outpatient Rx  Name  Route  Sig  Dispense  Refill  . aspirin 325 MG tablet   Oral   Take 325 mg by mouth daily.          . cadexomer iodine (IODOSORB) 0.9 % gel   Topical   Apply 1 application topically daily as needed for wound care.   40 g   5   . cephALEXin (KEFLEX) 500 MG capsule   Oral   Take 1 capsule (500 mg total) by mouth 3 (three) times daily.   30 capsule   2   . doxycycline (VIBRA-TABS) 100 MG tablet   Oral   Take 100 mg by mouth 2 (two) times daily.          . furosemide (LASIX) 20 MG tablet   Oral   Take 20 mg by mouth.         . metoprolol (LOPRESSOR) 100 MG tablet               . Multiple Vitamins-Minerals (CENTRUM SILVER PO)   Oral   Take 1 tablet by mouth daily.         . naproxen (NAPROSYN) 500 MG tablet   Oral   Take 1 tablet (500 mg total) by mouth 2 (two) times daily with a meal.   30 tablet   0   . omeprazole (PRILOSEC) 40 MG capsule   Oral   Take 40 mg by mouth daily.         Marland Kitchen oseltamivir (TAMIFLU) 75 MG capsule   Oral  Take 1 capsule (75 mg total) by mouth every 12 (twelve) hours.   10 capsule   0   . potassium chloride SA (K-DUR,KLOR-CON) 20 MEQ tablet   Oral   Take 20 mEq by mouth once.          . Probiotic Product (ALIGN PO)   Oral   Take 1 tablet by mouth daily.          BP 136/69  Pulse 110  Temp(Src) 99.1 F (37.3 C) (Oral)  Resp 20  Ht 6\' 4"  (1.93 m)  Wt 330 lb (149.687 kg)  BMI 40.19 kg/m2  SpO2 93% Physical Exam  Nursing note and vitals reviewed. Constitutional: He appears well-developed and well-nourished. No distress.  HENT:  Head: Normocephalic and atraumatic.  Mouth/Throat: Oropharynx is clear and moist. No oropharyngeal exudate.  Hearing is removed, tympanic membranes visualized and clear bilaterally  Eyes: Conjunctivae and EOM are normal. Pupils are equal, round, and reactive to light. Right eye exhibits no discharge. Left eye exhibits no discharge. No scleral icterus.  Neck: Normal range of motion. Neck supple. No JVD present. No thyromegaly present.  Cardiovascular: Regular rhythm, normal heart sounds and  intact distal pulses.  Exam reveals no gallop and no friction rub.   No murmur heard. Tachycardia approximately 105 beats per minute, normal pulses at the radial arteries, dorsalis pedis bilaterally, normal capillary refill bilaterally  Pulmonary/Chest: Effort normal and breath sounds normal. No respiratory distress. He has no wheezes. He has no rales.  Clear lungs, no respiratory distress, no increased work of breathing, speaks in full sentences  Abdominal: Soft. Bowel sounds are normal. He exhibits no distension and no mass. There is no tenderness.  Soft obese abdomen, no tenderness masses or guarding  Musculoskeletal: Normal range of motion. He exhibits edema. He exhibits no tenderness.  Bilateral edema of the lower extremities is mild, no tenderness with range of motion of the ankles, soft compartments, supple joints  Lymphadenopathy:    He has no cervical adenopathy.  Neurological: He is alert. Coordination normal.  Speech is clear, movements are coordinated  Skin: Skin is warm and dry. No rash noted. No erythema.  Slight discoloration of the bilateral ankles, according to family member and patient this is at baseline for the patient, no other signs of erythema or induration  Psychiatric: He has a normal mood and affect. His behavior is normal.    ED Course  Procedures (including critical care time) Labs Review Labs Reviewed  URINALYSIS, ROUTINE W REFLEX MICROSCOPIC - Abnormal; Notable for the following:    Hgb urine dipstick LARGE (*)    All other components within normal limits  CBC WITH DIFFERENTIAL - Abnormal; Notable for the following:    WBC 13.0 (*)    Neutrophils Relative % 85 (*)    Neutro Abs 11.0 (*)    Lymphocytes Relative 7 (*)    All other components within normal limits  BASIC METABOLIC PANEL - Abnormal; Notable for the following:    Glucose, Bld 107 (*)    Creatinine, Ser 1.45 (*)    GFR calc non Af Amer 47 (*)    GFR calc Af Amer 55 (*)    All other  components within normal limits  URINE MICROSCOPIC-ADD ON  INFLUENZA PANEL BY PCR (TYPE A & B, H1N1)   Imaging Review Dg Chest 2 View  06/30/2013   CLINICAL DATA:  Short of breath.  Congestion.  Wheezing.  EXAM: CHEST  2 VIEW  COMPARISON:  DG  CHEST 2 VIEW dated 06/02/2009  FINDINGS: Right apical bulla remains present. Cardiopericardial silhouette is within normal limits for projection. Aortic arch atherosclerosis. Emphysema. Thoracic spine DISH. Chronic bronchitic changes at the bases. No pneumothorax is present. Bilateral pleural apical scarring.  IMPRESSION: 1. No acute cardiopulmonary disease. 2. Emphysema with right apical bulla.   Electronically Signed   By: Dereck Ligas M.D.   On: 06/30/2013 06:33    EKG Interpretation   None       MDM   1. Febrile illness   2. Influenza-like illness    The patient is a fever over 103, slight upper respiratory symptoms including a slight cough, nasal congestion additionally has diffuse myalgias. This could be flu, will obtain basic lab and imaging workup to rule out other sources such as UTI, pneumonia. Tylenol given for fever, no hypotension present  Skin reexamined, no signs of cellulitis diffusely, no sinusitis, sinus tenderness and no new heart murmurs. Fever likely from an influenza-like illness, patient has been given antipyretics with successful defervesce and some fever, informed of this treatment plan and appears stable for discharge. He is agreeable to the plan.   Meds given in ED:  Medications  acetaminophen (TYLENOL) tablet 1,000 mg (1,000 mg Oral Given 06/30/13 0544)    New Prescriptions   NAPROXEN (NAPROSYN) 500 MG TABLET    Take 1 tablet (500 mg total) by mouth 2 (two) times daily with a meal.   OSELTAMIVIR (TAMIFLU) 75 MG CAPSULE    Take 1 capsule (75 mg total) by mouth every 12 (twelve) hours.      Johnna Acosta, MD 06/30/13 [phone]

## 2013-07-16 ENCOUNTER — Ambulatory Visit (INDEPENDENT_AMBULATORY_CARE_PROVIDER_SITE_OTHER): Payer: Medicare HMO

## 2013-07-16 ENCOUNTER — Encounter: Payer: Self-pay | Admitting: Podiatrist

## 2013-07-16 ENCOUNTER — Ambulatory Visit (INDEPENDENT_AMBULATORY_CARE_PROVIDER_SITE_OTHER): Payer: Medicare HMO | Admitting: Podiatrist

## 2013-07-16 VITALS — BP 150/84 | HR 64 | Resp 16

## 2013-07-16 DIAGNOSIS — Z9889 Other specified postprocedural states: Secondary | ICD-10-CM

## 2013-07-16 NOTE — Progress Notes (Signed)
   Subjective: Larenz presents today for his 3 month postop status post dorsiflexor he osteotomy of the first metatarsal, fusion of the hallux, extensor tendon release all on the left foot date of surgery 05/12/2013. Patient states "I am doing good on my left foot"  patient states he's been wearing his Darco shoe as instructed.  He denies any redness or swelling to the foot. Denies any drainage or tenderness to the foot. Overall he relates he's doing very well.  Objective: Excellent clinical appearance of the left foot is seen. Incision sites are healed and well coapted. Left hallux is still little bit swollen but much improved. 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 The ulceration is completely healed and the skin looks normal again.Overall excellent improvement on the left foot is seen both clinically and radiographically.   Assessment: Status post left foot surgery date of surgery 05/12/2013   Plan: Yair was instructed to wean from his Darco shoe into his good supportive running shoe with orthotic inserts.  I will see him back in one month for a final followup.If he experiences any problems or concerns he is instructed to call.

## 2013-07-18 ENCOUNTER — Encounter: Payer: Medicare HMO | Admitting: Podiatrist

## 2013-08-13 ENCOUNTER — Ambulatory Visit (INDEPENDENT_AMBULATORY_CARE_PROVIDER_SITE_OTHER): Payer: Medicare HMO

## 2013-08-13 ENCOUNTER — Ambulatory Visit (INDEPENDENT_AMBULATORY_CARE_PROVIDER_SITE_OTHER): Payer: Medicare HMO | Admitting: Podiatrist

## 2013-08-13 ENCOUNTER — Encounter: Payer: Self-pay | Admitting: Podiatrist

## 2013-08-13 VITALS — BP 156/95 | HR 66 | Resp 16

## 2013-08-13 DIAGNOSIS — Z9889 Other specified postprocedural states: Secondary | ICD-10-CM

## 2013-08-13 NOTE — Progress Notes (Signed)
   Subjective: Harry Franklin presents today for his 4 month postop status post dorsiflexory osteotomy of the first metatarsal, fusion of the hallux, extensor tendon release all on the left foot date of surgery 05/12/2013. Patient states "I am doing good on my left foot" patient states he's been wearing his brooks athletic shoe as instructed. He [place] the zoo and walked a lot and states his foot did well. He denies any redness or swelling to the foot. Denies any drainage or tenderness to the foot. He relates he began having a weird feeling in his calf and he saw his doctor in [place] and was diagnosed with a blood clot. He is now on Coumadin for the next 6 months.  Objective: Excellent clinical appearance of the left foot is seen. Incision sites are healed and well coapted. Left hallux is no longer swollen and much improved. 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 The ulceration is completely healed and the skin looks normal again.Overall excellent improvement on the left foot is seen both clinically and radiographically.   Assessment: Status post left foot surgery date of surgery 05/12/2013   Plan: Harry Franklin was instructed to continue wearing his good supportive running shoe with orthotic inserts. He will stay on the Coumadin as instructed. He will slowly get back into activities as tolerated. He asked about having the toenails removed and I am happy to do this however we need to wait until he is off the Coumadin. He'll call me in 6 months if he would like to have this performed. Otherwise he'll be seen back as needed for followup.

## 2013-08-15 ENCOUNTER — Ambulatory Visit: Payer: Medicare HMO | Admitting: Podiatrist

## 2014-01-30 ENCOUNTER — Encounter: Payer: Self-pay | Admitting: Podiatrist

## 2014-01-30 ENCOUNTER — Ambulatory Visit (INDEPENDENT_AMBULATORY_CARE_PROVIDER_SITE_OTHER): Payer: Medicare HMO | Admitting: Podiatrist

## 2014-01-30 VITALS — BP 179/93 | HR 64 | Resp 17 | Ht 76.0 in | Wt 314.0 lb

## 2014-01-30 DIAGNOSIS — L600 Ingrowing nail: Secondary | ICD-10-CM

## 2014-01-30 MED ORDER — CEPHALEXIN 500 MG PO CAPS: 500.0000 mg | ORAL_CAPSULE | Freq: Three times a day (TID) | ORAL | Status: DC

## 2014-01-30 NOTE — Patient Instructions (Signed)

## 2014-01-30 NOTE — Progress Notes (Signed)
   Subjective:    Patient ID: Harry Franklin, male    DOB: 1940-11-17, 73 y.o.   MRN: [id number]  HPI Comments: Pt states his surgery left foot DOS 05/14/2013 is fine.    Pt request removal of left 1, 3, 4, 5 toenails, states has been off Coumadin since January 20, 2014, and is on Aspirin 325mg .     Review of Systems     Objective:   Physical Exam Neurovascular status is unchanged with palpable pedal pulses at 2/4 DP and PT left and neurological sensation decreased left. The ulceration that had been present submetatarsal 1 of the left foot is completely healed and resolved at today's visit. Surgical incision sites are also completely healed as well. The patient has incurvated ingrown toenails 1, 3, 4, 5 of the left foot. Digital nail the second toe is absent. Redness, no swelling, no streaking, no sign of infection is present however pain with direct pressure is noted due to incurvation of toenails.      Assessment & Plan:  Incurvated hallux, third, fourth, fifth toenails left foot  Plan: Discussed permanent removal of toenails on the left foot today's visit I recommended removing digital nails 1 and 3. The patient agreed to the procedure was performed. Under Sterile technique The toe was anesthetized and exsanguinated and the total nail avulsion was performed. Phenol was applied to the matrix tissue followed by an alcohol wash. Antibiotic ointment and a dressing was applied. Patient was given instructions for aftercare. Will see him back in 2 weeks for followup and if that time he would like to digital nails 4 and 5 removed we can do at that time.

## 2014-02-13 ENCOUNTER — Ambulatory Visit (INDEPENDENT_AMBULATORY_CARE_PROVIDER_SITE_OTHER): Payer: Medicare HMO | Admitting: Podiatrist

## 2014-02-13 ENCOUNTER — Ambulatory Visit: Payer: Medicare HMO | Admitting: Podiatrist

## 2014-02-13 ENCOUNTER — Encounter: Payer: Self-pay | Admitting: Podiatrist

## 2014-02-13 VITALS — BP 154/80 | HR 74 | Resp 18

## 2014-02-13 DIAGNOSIS — L600 Ingrowing nail: Secondary | ICD-10-CM

## 2014-02-13 MED ORDER — CEPHALEXIN 500 MG PO CAPS: 500.0000 mg | ORAL_CAPSULE | Freq: Three times a day (TID) | ORAL | Status: DC

## 2014-02-13 NOTE — Progress Notes (Signed)
Subjective:  Harry Franklin presents today for follow up of permanent phenol matrixectomy of toenails 1,3 of the left foot.  He states he followed all instructions and the toenails are doing well.  He would like to have toenails 4,5 removed today if he is able.     Objective:   Physical Exam  Neurovascular status is unchanged with palpable pedal pulses at 2/4 DP and PT left and neurological sensation decreased left. The ulceration that had been present submetatarsal 1 of the left foot is completely healed and resolved at today's visit. Surgical incision sites are also completely healed as well. Excellent healing appearance of toenails 1,3 of the left foot noted.. continued incurvated ingrown toenails noted on digits 4, 5 of the left foot. Digital nail the second toe left is absent. Redness, no swelling, no streaking, no sign of infection is present however pain with direct pressure is noted due to incurvation of toenails.  Assessment & Plan:   Status post permanent excision of left halllux and third toenail.  Incurvated fourth, fifth toenails left foot   Plan: Discussed completing the permanent removal of toenails on the left foot today's visit with digital nails 4,5. The patient agreed to the procedure was performed. Under Sterile technique The toe was anesthetized and exsanguinated and the total nail avulsion was performed. Phenol was applied to the matrix tissue followed by an alcohol wash. Antibiotic ointment and a dressing was applied. Patient was given instructions for aftercare. Will see him back in 2 weeks for followup and if that time he would like to digital nails on the right foot removed we can do at that time. rx for keflex called into his pharmacy as well.

## 2014-02-13 NOTE — Patient Instructions (Signed)

## 2014-02-27 ENCOUNTER — Ambulatory Visit: Payer: Medicare HMO | Admitting: Podiatrist

## 2014-03-20 ENCOUNTER — Ambulatory Visit (INDEPENDENT_AMBULATORY_CARE_PROVIDER_SITE_OTHER): Payer: Medicare HMO | Admitting: Podiatrist

## 2014-03-20 ENCOUNTER — Encounter: Payer: Self-pay | Admitting: Podiatrist

## 2014-03-20 VITALS — BP 158/83 | HR 76 | Resp 16

## 2014-03-20 DIAGNOSIS — L600 Ingrowing nail: Secondary | ICD-10-CM

## 2014-03-20 MED ORDER — CEPHALEXIN 500 MG PO CAPS: 500.0000 mg | ORAL_CAPSULE | Freq: Three times a day (TID) | ORAL | Status: DC

## 2014-03-20 NOTE — Patient Instructions (Signed)
Subjective: Harry Franklin presents today for follow up of permanent phenol matrixectomy of toenails 1,3 of the left foot. He states he followed all instructions and the toenails are doing well. He would like to have toenails 4,5 removed today if he is able.  Objective:   Physical Exam  Neurovascular status is unchanged with palpable pedal pulses at 2/4 DP and PT left and neurological sensation decreased left. The ulceration that had been present submetatarsal 1 of the left foot is completely healed and resolved at today's visit. Surgical incision sites are also completely healed as well. Excellent healing appearance of toenails 1,3 of the left foot noted.. continued incurvated ingrown toenails noted on digits 4, 5 of the left foot. Digital nail the second toe left is absent. Redness, no swelling, no streaking, no sign of infection is present however pain with direct pressure is noted due to incurvation of toenails.  Assessment & Plan:   Status post permanent excision of left halllux and third toenail. Incurvated fourth, fifth toenails left foot  Plan: Discussed completing the permanent removal of toenails on the left foot today's visit with digital nails 4,5. The patient agreed to the procedure was performed. Under Sterile technique The toe was anesthetized and exsanguinated and the total nail avulsion was performed. Phenol was applied to the matrix tissue followed by an alcohol wash. Antibiotic ointment and a dressing was applied. Patient was given instructions for aftercare. Will see Harry Franklin back in 2 weeks for followup and if that time he would like to digital nails on the right foot removed we can do at that time. rx for keflex called into his pharmacy as well.

## 2014-03-24 NOTE — Progress Notes (Signed)
Subjective: Mr. Lazarz presents today for follow up of permanent phenol matrixectomy of toenails 1,3,4,5 of the left foot. He states he followed all instructions and the toenails are doing well. He would like to have toenails 1,2 on the right foot removed today if he is able.    Objective:   Physical Exam  Neurovascular status is unchanged with palpable pedal pulses at 2/4 DP and PT left and neurological sensation decreased left. The ulceration that had been present submetatarsal 1 of the left foot is completely healed and resolved at today's visit. Surgical incision sites are also completely healed as well. Excellent healing appearance of toenails 1,3,4,5  of the left foot noted.. continued incurvated ingrown toenails noted on digits 1,2,3,4,5 of the right foot. Digital nail the second toe left is absent. No Redness, no swelling, no streaking, no sign of infection is present.  On the right foot pain with direct pressure is noted due to incurvation of toenails.  Assessment & Plan:   Status post permanent excision of left halllux and third, fourth and fifth toenail. Incurvated 1,2,3,4,5 toenails right foot.   Plan: Discussed  the permanent removal of toenails on the right foot today's visit with digital nails 1,2.   The patient agreed to the procedure was performed. Under Sterile technique The toe was anesthetized and exsanguinated and the total nail avulsion was performed. Phenol was applied to the matrix tissue followed by an alcohol wash. Antibiotic ointment and a dressing was applied. Patient was given instructions for aftercare. Will see him back in 2 weeks for followup and if that time he would like the remainder digital nails on the right foot removed we can do at that time. rx for keflex called into his pharmacy as well.

## 2014-04-02 ENCOUNTER — Ambulatory Visit (INDEPENDENT_AMBULATORY_CARE_PROVIDER_SITE_OTHER): Payer: Medicare HMO | Admitting: Podiatrist

## 2014-04-02 ENCOUNTER — Encounter: Payer: Self-pay | Admitting: Podiatrist

## 2014-04-02 VITALS — BP 160/80 | HR 79 | Resp 15

## 2014-04-02 DIAGNOSIS — L600 Ingrowing nail: Secondary | ICD-10-CM

## 2014-04-02 MED ORDER — AMOXICILLIN-POT CLAVULANATE 875-125 MG PO TABS: 1.0000 | ORAL_TABLET | Freq: Two times a day (BID) | ORAL | Status: DC

## 2014-04-02 NOTE — Progress Notes (Signed)
Subjective:  Mr. Stiff presents today for follow up of permanent phenol matrixectomy of toenails 1,2 of the right foot.  He states he followed all instructions and the right great toenail is sore and tender.  He would like to have toenail 3 right removed today if he is able.     Objective:   Physical Exam  Neurovascular status is unchanged with palpable pedal pulses at 2/4 DP and PT left and neurological sensation decreased left. The ulceration that had been present submetatarsal 1 of the left foot is completely healed and resolved at today's visit. Surgical incision sites are also completely healed as well. Excellent healing appearance of toenails 1,2 of the right foot noted.. continued incurvated ingrown toenails noted on digits 3 of the right foot. Digital nail the 4,5 toenails right are normal in thickness.  Mild  Redness, noted to the right great toenail that was removed.    Assessment & Plan:   Status post permanent excision of right halllux and second toenail.  Incurvated 3rd toenails right foot   Plan: Discussed completing the permanent removal of toenails on the right foot at today's visit with digital nail 3 and leaving the 4th and 5th alone since they are not mycotic. The patient agreed to the procedure was performed. Under Sterile technique The toe was anesthetized and exsanguinated and the total nail avulsion was performed. Phenol was applied to the matrix tissue followed by an alcohol wash. Antibiotic ointment and a dressing was applied. Patient was given instructions for aftercare. Will see him back  for followup and augmentin called into his pharmacy to take

## 2014-04-02 NOTE — Patient Instructions (Signed)

## 2014-04-03 ENCOUNTER — Ambulatory Visit: Payer: Medicare HMO | Admitting: Podiatrist

## 2014-05-27 ENCOUNTER — Ambulatory Visit: Payer: Medicare HMO | Admitting: Podiatrist

## 2014-07-09 ENCOUNTER — Telehealth: Payer: Self-pay

## 2014-07-09 NOTE — Telephone Encounter (Signed)
PATIENT CAME INTO OFFICE WITH A LETTER TO SCHEDULE A COLONOSCOPY  LEST PAPER HERE  PLEASE CALL  [phone] OR [phone]

## 2014-07-15 NOTE — Telephone Encounter (Signed)
See separate note. Pt has OV.

## 2014-07-15 NOTE — Telephone Encounter (Signed)
PT came by the office to schedule his next colonoscopy. His last one was 03/31/2005 by Dr. Carlean Purl and he had an inadequate prep. Next was supposed to be in 5 years. He is not having any problems. He wanted to come to [place] because of the distance. His wife does not drive and he will need [facility] to transport him, although his wife will go with him.  He is scheduled for an OV with Walden Field, NP on 07/30/2014 at 8:30 Am due to the fact his last colonoscopy he had an inadequate prep.

## 2014-07-30 ENCOUNTER — Ambulatory Visit (INDEPENDENT_AMBULATORY_CARE_PROVIDER_SITE_OTHER): Payer: Medicare HMO | Admitting: Nurse Practitioner

## 2014-07-30 ENCOUNTER — Other Ambulatory Visit: Payer: Self-pay

## 2014-07-30 ENCOUNTER — Encounter: Payer: Self-pay | Admitting: Nurse Practitioner

## 2014-07-30 ENCOUNTER — Telehealth: Payer: Self-pay

## 2014-07-30 VITALS — BP 135/70 | HR 73 | Temp 97.0°F | Ht 76.0 in | Wt 319.8 lb

## 2014-07-30 DIAGNOSIS — Z1211 Encounter for screening for malignant neoplasm of colon: Secondary | ICD-10-CM

## 2014-07-30 DIAGNOSIS — R1314 Dysphagia, pharyngoesophageal phase: Secondary | ICD-10-CM

## 2014-07-30 DIAGNOSIS — R1310 Dysphagia, unspecified: Secondary | ICD-10-CM

## 2014-07-30 MED ORDER — PEG 3350-KCL-NA BICARB-NACL 420 G PO SOLR: 4000.0000 mL | Freq: Once | ORAL | Status: DC

## 2014-07-30 NOTE — Assessment & Plan Note (Signed)
Patient with occasional worsenin solid food dysphagia, denies pill dysphagia. Had esophageal dilation sometime after 2006 but doesn't remember exact year or where. History of GERD well controlled on current regimen. No red flag/warning signs/symptoms. Wishes to proceed with repeat EGD and possible dilation while undergoing colonoscopy. No blood thinners other than saily ASA.   Proceed with TCS and EGD +/- dilation with Dr. Gala Romney in near future: the risks, benefits, and alternatives have been discussed with the patient in detail. The patient states understanding and desires to proceed.

## 2014-07-30 NOTE — Progress Notes (Signed)
Primary Care Physician:  [name], MD Primary Gastroenterologist:  Dr. Gala Romney  Chief Complaint  Patient presents with  . Colonoscopy    POOR PREP LAST ONE/ NOT HERE    HPI:   75 year old male presents to schedule a colonoscopy. Last colonoscopy 03/31/2005 with fair prep on Miralax prep and retained stool and limited view of potential polyps 5 mm or less. No polyps seen, noted diverticulum. Recommended 5 year repeat due to prep quality.  Today states he's not having any GI issues. Denies hematochezia and melena, abdominal pain, N/V/D. Does admit occasional dysphagia with solid food, denies pill dysphagia. Has esophageal stretching sometime after 2006. Has a history of GERD which is currently well controlled on current regimen. Denies regurgitation. Denies hematemesis, fever, chills, unintentional weight loss, worsening fatigue, change in appetite, change in bowel habits/consistency, chest pain, dyspnea. Denies NSAIDs or ASA powders. Takes Tylenol if pain medication needed. Admits poor prep last time with Miralax prep. Denies any other upper or lower GI symptoms.   Past Medical History  Diagnosis Date  . GERD (gastroesophageal reflux disease)   . Hypokalemia   . Hypertension   . Cataract   . Arthritis     Past Surgical History  Procedure Laterality Date  . Replacement total knee    . Appendectomy    . Hammer toe surgery Left   . Lapidus procedure    . Hallux fusion    . Colonoscopy  03/31/05    Current Outpatient Prescriptions  Medication Sig Dispense Refill  . aspirin 325 MG tablet Take 325 mg by mouth daily.    . bifidobacterium infantis (ALIGN) capsule Take by mouth.    . furosemide (LASIX) 20 MG tablet Take 20 mg by mouth. Takes one tablet a day x 4 days a week    . metoprolol (LOPRESSOR) 100 MG tablet     . Multiple Vitamins-Minerals (CENTRUM SILVER PO) Take 1 tablet by mouth daily.    . NON FORMULARY Gas X    One tablet daily    . omeprazole (PRILOSEC) 40 MG  capsule Take 40 mg by mouth daily.    . potassium chloride SA (K-DUR,KLOR-CON) 20 MEQ tablet Take 20 mEq by mouth once.     . Probiotic Product (ALIGN PO) Take 1 tablet by mouth daily.    . polyethylene glycol-electrolytes (NULYTELY/GOLYTELY) 420 G solution Take 4,000 mLs by mouth once. 4000 mL 0   No current facility-administered medications for this visit.    Allergies as of 07/30/2014 - Review Complete 07/30/2014  Allergen Reaction Noted  . Rabeprazole  02/13/2014  . Vancomycin Hives and Rash 11/23/2012    Family History  Problem Relation Age of Onset  . Colon cancer Maternal Uncle   . Heart disease Father     History   Social History  . Marital Status: Married    Spouse Name: N/A  . Number of Children: N/A  . Years of Education: N/A   Occupational History  . Not on file.   Social History Main Topics  . Smoking status: Former Smoker    Quit date: 01/22/1984  . Smokeless tobacco: Not on file     Comment: qUIT IN 1985  . Alcohol Use: No  . Drug Use: No  . Sexual Activity: Not on file   Other Topics Concern  . Not on file   Social History Narrative    Review of Systems: General: Negative for anorexia, weight loss, fever, chills. ENT: Negative for hoarseness,  difficulty swallowing , nasal congestion. CV: Negative for chest pain, angina, palpitations, peripheral edema.  Respiratory: Negative for dyspnea at rest, dyspnea on exertion, sputum, wheezing.  GI: See history of present illness. MS: Negative for joint pain, low back pain.  Derm: Negative for rash or itching.  Neuro: Negative for weakness, seizure, memory loss, confusion.  Psych: Negative for anxiety, depression, hallucinations.  Endo: Negative for unusual weight change.  Heme: Negative for bruising or bleeding. Allergy: Negative for rash or hives.   Physical Exam: BP 135/70 mmHg  Pulse 73  Temp(Src) 97 F (36.1 C) (Oral)  Ht 6\' 4"  (1.93 m)  Wt 319 lb 12.8 oz (145.06 kg)  BMI 38.94  kg/m2 General:   Alert and oriented. Pleasant and cooperative. Well-nourished and well-developed.  Head:  Normocephalic and atraumatic. Eyes:  Without icterus, sclera clear and conjunctiva pink.  Ears:  Normal auditory acuity. Wears hearing aids bilaterally. Mouth:  No deformity or lesions, oral mucosa pink. No OP edema. Neck:  Supple, without mass or thyromegaly. Lungs:  Clear to auscultation bilaterally. No wheezes, rales, or rhonchi. No distress.  Heart:  S1, S2 present without murmurs appreciated.  Abdomen:  +BS, soft, non-tender and non-distended. No HSM noted. No guarding or rebound. No masses appreciated.  Rectal:  Deferred  Msk:  Symmetrical without gross deformities. Normal posture. Pulses:  Normal DP pulses noted. Extremities:  Without clubbing or edema. Neurologic:  Alert and  oriented x4;  grossly normal neurologically. Skin:  Intact without significant lesions or rashes. Cervical Nodes:  No significant cervical adenopathy.  Psych:  Alert and cooperative. Normal mood and affect.     07/30/2014 2:41 PM

## 2014-07-30 NOTE — Assessment & Plan Note (Signed)
Overdue for repeat screening colonoscopy with previous colonoscopy with fair prep on Miralax prep. Recommend full prep and clear liquid diet 2 days prior, split prep best. Patient also with dysphagia symptoms and wishes to proceed with EGD with possible dilation at this time. No red flag/warning signs such as hematochezia, melena, fever/chills, unintentional weight loss.  Proceed with TCS and EGD +/- dilation with Dr. Gala Romney in near future: the risks, benefits, and alternatives have been discussed with the patient in detail. The patient states understanding and desires to proceed.

## 2014-07-30 NOTE — Telephone Encounter (Signed)
Upon going over procedure instructions pt did not want to comply with reccommendations on spliting the prep dose due to time of having to drink it.  May we proceed with Trilyte prep full 2 days of clear liquids and fleet am of procedure.

## 2014-07-30 NOTE — Patient Instructions (Signed)
1. We will schedule your procedures for you (colonoscopy and endoscopy with possible dilation) 2. We will likely give you special prep instructions to make sure everything goes well. 3. Further recommendations to be based on the results of your procedures.

## 2014-07-31 NOTE — Progress Notes (Signed)
cc'ed to pcp °

## 2014-07-31 NOTE — Telephone Encounter (Signed)
Yes that's fine if that's what he'll do. Hopefully he'll have a good prep.

## 2014-08-03 NOTE — Telephone Encounter (Signed)
Noted and pt aware 

## 2014-09-28 ENCOUNTER — Ambulatory Visit (INDEPENDENT_AMBULATORY_CARE_PROVIDER_SITE_OTHER): Payer: Medicare HMO | Admitting: Nurse Practitioner

## 2014-09-28 ENCOUNTER — Encounter: Payer: Self-pay | Admitting: Nurse Practitioner

## 2014-09-28 VITALS — BP 143/80 | HR 70 | Temp 97.0°F | Ht 77.0 in | Wt 312.2 lb

## 2014-09-28 DIAGNOSIS — Z1211 Encounter for screening for malignant neoplasm of colon: Secondary | ICD-10-CM

## 2014-09-28 NOTE — Progress Notes (Signed)
Referring Provider: Bernerd Limbo, MD Primary Care Physician:  [name], MD Primary GI: Dr. Gala Romney  Chief Complaint  Patient presents with  . set up TCS/ update H&P    HPI:   74 year old male presents for 30 to have dictated H&P prior procedure. Last colonoscopy 03/31/2005 with fair prep on Miralax prep and retained stool and limited view of potential polyps 5 mm or less. No polyps seen, noted diverticulum. Recommended 5 year repeat due to prep quality.  Today he states he's been doing well. Had to postpone colonoscopy due to scheduling. Has changed his GERD medication from omeprazole to ranitidine and is doing well with this. Denies abdominal pain, N/V, chest pain, dyspnea, fever/chills, unintentional weight loss, hematochezia, melena, bowel changes. Denies any other upper or lower GI symptoms.  Past Medical History  Diagnosis Date  . GERD (gastroesophageal reflux disease)   . Hypokalemia   . Hypertension   . Cataract   . Arthritis     Past Surgical History  Procedure Laterality Date  . Replacement total knee    . Appendectomy    . Hammer toe surgery Left   . Lapidus procedure    . Hallux fusion    . Colonoscopy  03/31/05    Current Outpatient Prescriptions  Medication Sig Dispense Refill  . aspirin 325 MG tablet Take 325 mg by mouth daily.    . furosemide (LASIX) 20 MG tablet Take 20 mg by mouth. Takes one tablet a day x 4 days a week    . metoprolol (LOPRESSOR) 100 MG tablet     . Multiple Vitamins-Minerals (CENTRUM SILVER PO) Take 1 tablet by mouth daily.    . potassium chloride SA (K-DUR,KLOR-CON) 20 MEQ tablet Take 20 mEq by mouth once.     . ranitidine (ZANTAC) 150 MG capsule Take 150 mg by mouth 2 (two) times daily.    . NON FORMULARY Gas X    One tablet daily    . omeprazole (PRILOSEC) 40 MG capsule Take 40 mg by mouth daily.    . polyethylene glycol-electrolytes (NULYTELY/GOLYTELY) 420 G solution Take 4,000 mLs by mouth once. (Patient not taking:  Reported on 09/28/2014) 4000 mL 0  . Probiotic Product (ALIGN PO) Take 1 tablet by mouth daily.     No current facility-administered medications for this visit.    Allergies as of 09/28/2014 - Review Complete 09/28/2014  Allergen Reaction Noted  . Rabeprazole  02/13/2014  . Vancomycin Hives and Rash 11/23/2012    Family History  Problem Relation Age of Onset  . Colon cancer Maternal Uncle   . Heart disease Father     History   Social History  . Marital Status: Married    Spouse Name: N/A  . Number of Children: N/A  . Years of Education: N/A   Social History Main Topics  . Smoking status: Former Smoker    Quit date: 01/22/1984  . Smokeless tobacco: Not on file     Comment: qUIT IN 1985  . Alcohol Use: No  . Drug Use: No  . Sexual Activity: Not on file   Other Topics Concern  . None   Social History Narrative    Review of Systems: General: Negative for anorexia, weight loss, fever, chills. ENT: Negative for hoarseness, difficulty swallowing , nasal congestion. CV: Negative for chest pain, angina, palpitations, peripheral edema.  Respiratory: Negative for dyspnea at rest, dyspnea on exertion, sputum, wheezing.  GI: See history of present illness. MS: Negative for joint pain,  low back pain.  Derm: Negative for rash or itching.  Neuro: Negative for weakness, seizure, memory loss, confusion.  Psych: Negative for anxiety, depression, hallucinations.  Endo: Negative for unusual weight change.  Heme: Negative for bruising or bleeding. Allergy: Negative for rash or hives.   Physical Exam: BP 143/80 mmHg  Pulse 70  Temp(Src) 97 F (36.1 C)  Ht 6\' 5"  (1.956 m)  Wt 312 lb 3.2 oz (141.613 kg)  BMI 37.01 kg/m2 General: Alert and oriented. Pleasant and cooperative. Well-nourished and well-developed.  Head: Normocephalic and atraumatic. Eyes: Without icterus, sclera clear and conjunctiva pink.  Ears: Normal auditory acuity. Wears hearing aids  bilaterally. Lungs: Clear to auscultation bilaterally. No wheezes, rales, or rhonchi. No distress.  Heart: S1, S2 present without murmurs appreciated.  Abdomen: +BS, soft, non-tender and non-distended. No HSM noted. No guarding or rebound. No masses appreciated.  Rectal: Deferred  Msk: Symmetrical without gross deformities. Normal posture. Extremities: Without clubbing or edema. Neurologic: Alert and oriented x4; grossly normal neurologically. Skin: Intact without significant lesions or rashes. Psych: Alert and cooperative. Normal mood and affect.   09/28/2014 8:45 AM

## 2014-09-28 NOTE — Patient Instructions (Signed)
1. Proceed with the previously scheduled colonoscopy. 2. Further recommendations to be based on results your procedure

## 2014-09-28 NOTE — Assessment & Plan Note (Signed)
74 year old male presents for update H&P prior to procedure. No changes from last visit. Essentially asymptomatic from a GI standpoint. Still has his medication and instructions from his previous visit. Had to delay colonoscopy due to scheduling issues. We'll proceed with previously scheduled colonoscopy, which is said to occur this Friday.

## 2014-10-02 ENCOUNTER — Encounter (HOSPITAL_COMMUNITY)
Admission: RE | Disposition: A | Discharge status: Home / Self Care | Payer: Self-pay | Source: Ambulatory Visit | Attending: Internal Medicine

## 2014-10-02 ENCOUNTER — Ambulatory Visit (HOSPITAL_COMMUNITY)
Admission: RE | Admit: 2014-10-02 | Discharge: 2014-10-02 | Disposition: A | Discharge status: Home / Self Care | Payer: Medicare HMO | Source: Ambulatory Visit | Attending: Internal Medicine | Admitting: Internal Medicine

## 2014-10-02 ENCOUNTER — Encounter (HOSPITAL_COMMUNITY): Payer: Self-pay

## 2014-10-02 DIAGNOSIS — Z1211 Encounter for screening for malignant neoplasm of colon: Secondary | ICD-10-CM | POA: Insufficient documentation

## 2014-10-02 DIAGNOSIS — Z96659 Presence of unspecified artificial knee joint: Secondary | ICD-10-CM | POA: Diagnosis not present

## 2014-10-02 DIAGNOSIS — Z888 Allergy status to other drugs, medicaments and biological substances status: Secondary | ICD-10-CM | POA: Diagnosis not present

## 2014-10-02 DIAGNOSIS — I10 Essential (primary) hypertension: Secondary | ICD-10-CM | POA: Insufficient documentation

## 2014-10-02 DIAGNOSIS — R1314 Dysphagia, pharyngoesophageal phase: Secondary | ICD-10-CM

## 2014-10-02 DIAGNOSIS — Z9049 Acquired absence of other specified parts of digestive tract: Secondary | ICD-10-CM | POA: Diagnosis not present

## 2014-10-02 DIAGNOSIS — K222 Esophageal obstruction: Secondary | ICD-10-CM | POA: Diagnosis not present

## 2014-10-02 DIAGNOSIS — K3189 Other diseases of stomach and duodenum: Secondary | ICD-10-CM | POA: Diagnosis not present

## 2014-10-02 DIAGNOSIS — M1990 Unspecified osteoarthritis, unspecified site: Secondary | ICD-10-CM | POA: Diagnosis not present

## 2014-10-02 DIAGNOSIS — K621 Rectal polyp: Secondary | ICD-10-CM | POA: Diagnosis not present

## 2014-10-02 DIAGNOSIS — K2100 Gastro-esophageal reflux disease with esophagitis, without bleeding: Secondary | ICD-10-CM | POA: Insufficient documentation

## 2014-10-02 DIAGNOSIS — K2210 Ulcer of esophagus without bleeding: Secondary | ICD-10-CM | POA: Diagnosis not present

## 2014-10-02 DIAGNOSIS — Z881 Allergy status to other antibiotic agents status: Secondary | ICD-10-CM | POA: Insufficient documentation

## 2014-10-02 DIAGNOSIS — K449 Diaphragmatic hernia without obstruction or gangrene: Secondary | ICD-10-CM | POA: Diagnosis not present

## 2014-10-02 DIAGNOSIS — K5730 Diverticulosis of large intestine without perforation or abscess without bleeding: Secondary | ICD-10-CM | POA: Insufficient documentation

## 2014-10-02 DIAGNOSIS — E876 Hypokalemia: Secondary | ICD-10-CM | POA: Diagnosis not present

## 2014-10-02 DIAGNOSIS — Z7982 Long term (current) use of aspirin: Secondary | ICD-10-CM | POA: Diagnosis not present

## 2014-10-02 DIAGNOSIS — R1310 Dysphagia, unspecified: Secondary | ICD-10-CM | POA: Diagnosis present

## 2014-10-02 DIAGNOSIS — Z87891 Personal history of nicotine dependence: Secondary | ICD-10-CM | POA: Insufficient documentation

## 2014-10-02 HISTORY — PX: MALONEY DILATION: SHX5535

## 2014-10-02 HISTORY — PX: ESOPHAGOGASTRODUODENOSCOPY: SHX5428

## 2014-10-02 HISTORY — PX: COLONOSCOPY: SHX5424

## 2014-10-02 SURGERY — COLONOSCOPY
Anesthesia: Moderate Sedation

## 2014-10-02 MED ORDER — MIDAZOLAM HCL 5 MG/5ML IJ SOLN
INTRAMUSCULAR | Status: DC | PRN
  Administered 2014-10-02: 1 mg via INTRAVENOUS
  Administered 2014-10-02 (×2): 2 mg via INTRAVENOUS
  Administered 2014-10-02: 1 mg via INTRAVENOUS

## 2014-10-02 MED ORDER — SODIUM CHLORIDE 0.9 % IV SOLN
INTRAVENOUS | Status: DC
  Administered 2014-10-02: 07:00:00 via INTRAVENOUS

## 2014-10-02 MED ORDER — LIDOCAINE VISCOUS 2 % MT SOLN
OROMUCOSAL | Status: DC | PRN
  Administered 2014-10-02: 1 via OROMUCOSAL

## 2014-10-02 MED ORDER — ONDANSETRON HCL 4 MG/2ML IJ SOLN
INTRAMUSCULAR | Status: AC
  Filled 2014-10-02: qty 2

## 2014-10-02 MED ORDER — SIMETHICONE 40 MG/0.6ML PO SUSP
ORAL | Status: DC | PRN
  Administered 2014-10-02: 08:00:00

## 2014-10-02 MED ORDER — ONDANSETRON HCL 4 MG/2ML IJ SOLN
INTRAMUSCULAR | Status: DC | PRN
  Administered 2014-10-02: 4 mg via INTRAVENOUS

## 2014-10-02 MED ORDER — MEPERIDINE HCL 100 MG/ML IJ SOLN
INTRAMUSCULAR | Status: AC
  Filled 2014-10-02: qty 2

## 2014-10-02 MED ORDER — MIDAZOLAM HCL 5 MG/5ML IJ SOLN
INTRAMUSCULAR | Status: AC
  Filled 2014-10-02: qty 10

## 2014-10-02 MED ORDER — LIDOCAINE VISCOUS 2 % MT SOLN
OROMUCOSAL | Status: AC
  Filled 2014-10-02: qty 15

## 2014-10-02 MED ORDER — MEPERIDINE HCL 100 MG/ML IJ SOLN
INTRAMUSCULAR | Status: DC | PRN
  Administered 2014-10-02 (×2): 25 mg via INTRAVENOUS
  Administered 2014-10-02: 50 mg via INTRAVENOUS

## 2014-10-02 NOTE — Op Note (Signed)
[facility] [address] [place], [address]   ENDOSCOPY PROCEDURE REPORT  PATIENT: Harry, Franklin  MR#: [id number] BIRTHDATE: 1941/03/24 , 73  yrs. old GENDER: male ENDOSCOPIST: R.  Garfield Cornea, MD FACP FACG REFERRED BY:  Bernerd Limbo, M.D. PROCEDURE DATE:  2014-10-31 PROCEDURE:  EGD w/ biopsy and Maloney dilation of esophagus INDICATIONS:  GERD; recurrent esophageal dysphagia. MEDICATIONS: Versed 5 mg IV and Demerol 100 mg IV in divided doses. Xylocaine gel orally.  Zofran 4 mg IV. ASA CLASS:      Class II  CONSENT: The risks, benefits, limitations, alternatives and imponderables have been discussed.  The potential for biopsy, esophogeal dilation, etc. have also been reviewed.  Questions have been answered.  All parties agreeable.  Please see the history and physical in the medical record for more information.  DESCRIPTION OF PROCEDURE: After the risks benefits and alternatives of the procedure were thoroughly explained, informed consent was obtained.  The EG-[id number] ([id number]) endoscope was introduced through the mouth and advanced to the second portion of the duodenum , limited by Without limitations. The instrument was slowly withdrawn as the mucosa was fully examined.    Patient had extensive "geographic" ulceration involving the distal esophagus within 3-4 cm of the GE junction..  Severe inflammation. Short benign-appearing peptic stricture.  Some resistance upon passing the gastroscope across stricture; no obvious tumor.  No obvious Barrett's esophagus but inflammation was significant. Stomach empty.  Antral erosions.  5-6 cm hiatal hernia present.  No ulcer or infiltrating process.  Patent pylorus.  Normal-appearing first and second portion of the duodenum.  Scope was withdrawn and a 54 [place] Maloney dilator was passed 2 full insertion with mild-to-moderate resistance.  A look back revealed the stricture had been dilated with minimal bleeding  and without apparent complication.  Subsequently, the area of distal esophageal ulceration along with the abnormal gastric mucosa were biopsied.  Retroflexed views revealed a hiatal hernia.     The scope was then withdrawn from the patient and the procedure completed.  COMPLICATIONS: There were no immediate complications.  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 Status post esophageal dilation biopsy as described. Large hiatal hernia. Gastric erosions of uncertain significance?"status post gastric biopsy  RECOMMENDATIONS: Stop omeprazole; begin Dexilant 60 mg daily. Follow-up on pathology. See colonoscopy report  REPEAT EXAM:  eSigned:  R. Garfield Cornea, MD Rosalita Chessman [facility] 10-31-2014 8:15 AM    CC:  CPT CODES: ICD CODES:  The ICD and CPT codes recommended by this software are interpretations from the data that the clinical staff has captured with the software.  The verification of the translation of this report to the ICD and CPT codes and modifiers is the sole responsibility of the health care institution and practicing physician where this report was generated.  [employer]. will not be held responsible for the validity of the ICD and CPT codes included on this report.  AMA assumes no liability for data contained or not contained herein. CPT is a Designer, television/film set of the [employer].  PATIENT NAME:  Harry, Franklin MR#: [id number]

## 2014-10-02 NOTE — Discharge Instructions (Signed)
EGD Discharge instructions Please read the instructions outlined below and refer to this sheet in the next few weeks. These discharge instructions provide you with general information on caring for yourself after you leave the hospital. Your doctor may also give you specific instructions. While your treatment has been planned according to the most current medical practices available, unavoidable complications occasionally occur. If you have any problems or questions after discharge, please call your doctor. ACTIVITY  You may resume your regular activity but move at a slower pace for the next 24 hours.   Take frequent rest periods for the next 24 hours.   Walking will help expel (get rid of) the air and reduce the bloated feeling in your abdomen.   No driving for 24 hours (because of the anesthesia (medicine) used during the test).   You may shower.   Do not sign any important legal documents or operate any machinery for 24 hours (because of the anesthesia used during the test).  NUTRITION  Drink plenty of fluids.   You may resume your normal diet.   Begin with a light meal and progress to your normal diet.   Avoid alcoholic beverages for 24 hours or as instructed by your caregiver.  MEDICATIONS  You may resume your normal medications unless your caregiver tells you otherwise.  WHAT YOU CAN EXPECT TODAY  You may experience abdominal discomfort such as a feeling of fullness or gas pains.  FOLLOW-UP  Your doctor will discuss the results of your test with you.  SEEK IMMEDIATE MEDICAL ATTENTION IF ANY OF THE FOLLOWING OCCUR:  Excessive nausea (feeling sick to your stomach) and/or vomiting.   Severe abdominal pain and distention (swelling).   Trouble swallowing.   Temperature over 101 F (37.8 C).   Rectal bleeding or vomiting of blood.   Colonoscopy Discharge Instructions  Read the instructions outlined below and refer to this sheet in the next few weeks. These  discharge instructions provide you with general information on caring for yourself after you leave the hospital. Your doctor may also give you specific instructions. While your treatment has been planned according to the most current medical practices available, unavoidable complications occasionally occur. If you have any problems or questions after discharge, call Dr. Gala Romney at [phone]. ACTIVITY  You may resume your regular activity, but move at a slower pace for the next 24 hours.   Take frequent rest periods for the next 24 hours.   Walking will help get rid of the air and reduce the bloated feeling in your belly (abdomen).   No driving for 24 hours (because of the medicine (anesthesia) used during the test).    Do not sign any important legal documents or operate any machinery for 24 hours (because of the anesthesia used during the test).  NUTRITION  Drink plenty of fluids.   You may resume your normal diet as instructed by your doctor.   Begin with a light meal and progress to your normal diet. Heavy or fried foods are harder to digest and may make you feel sick to your stomach (nauseated).   Avoid alcoholic beverages for 24 hours or as instructed.  MEDICATIONS  You may resume your normal medications unless your doctor tells you otherwise.  WHAT YOU CAN EXPECT TODAY  Some feelings of bloating in the abdomen.   Passage of more gas than usual.   Spotting of blood in your stool or on the toilet paper.  IF YOU HAD POLYPS REMOVED DURING THE COLONOSCOPY:  No aspirin products for 7 days or as instructed.   No alcohol for 7 days or as instructed.   Eat a soft diet for the next 24 hours.  FINDING OUT THE RESULTS OF YOUR TEST Not all test results are available during your visit. If your test results are not back during the visit, make an appointment with your caregiver to find out the results. Do not assume everything is normal if you have not heard from your caregiver or the  medical facility. It is important for you to follow up on all of your test results.  SEEK IMMEDIATE MEDICAL ATTENTION IF:  You have more than a spotting of blood in your stool.   Your belly is swollen (abdominal distention).   You are nauseated or vomiting.   You have a temperature over 101.   You have abdominal pain or discomfort that is severe or gets worse throughout the day.     GERD and diverticulosis information provided  Stop omeprazole; begin Dexilant 60 mg daily  Further recommendations to follow pending review of pathology report  Office visit with [place] in 3 months  Gastroesophageal Reflux Disease, Adult Gastroesophageal reflux disease (GERD) happens when acid from your stomach flows up into the esophagus. When acid comes in contact with the esophagus, the acid causes soreness (inflammation) in the esophagus. Over time, GERD may create small holes (ulcers) in the lining of the esophagus. CAUSES   Increased body weight. This puts pressure on the stomach, making acid rise from the stomach into the esophagus.  Smoking. This increases acid production in the stomach.  Drinking alcohol. This causes decreased pressure in the lower esophageal sphincter (valve or ring of muscle between the esophagus and stomach), allowing acid from the stomach into the esophagus.  Late evening meals and a full stomach. This increases pressure and acid production in the stomach.  A malformed lower esophageal sphincter. Sometimes, no cause is found. SYMPTOMS   Burning pain in the lower part of the mid-chest behind the breastbone and in the mid-stomach area. This may occur twice a week or more often.  Trouble swallowing.  Sore throat.  Dry cough.  Asthma-like symptoms including chest tightness, shortness of breath, or wheezing. DIAGNOSIS  Your caregiver may be able to diagnose GERD based on your symptoms. In some cases, X-rays and other tests may be done to check for complications or to  check the condition of your stomach and esophagus. TREATMENT  Your caregiver may recommend over-the-counter or prescription medicines to help decrease acid production. Ask your caregiver before starting or adding any new medicines.  HOME CARE INSTRUCTIONS   Change the factors that you can control. Ask your caregiver for guidance concerning weight loss, quitting smoking, and alcohol consumption.  Avoid foods and drinks that make your symptoms worse, such as:  Caffeine or alcoholic drinks.  Chocolate.  Peppermint or mint flavorings.  Garlic and onions.  Spicy foods.  Citrus fruits, such as oranges, lemons, or limes.  Tomato-based foods such as sauce, chili, salsa, and pizza.  Fried and fatty foods.  Avoid lying down for the 3 hours prior to your bedtime or prior to taking a nap.  Eat small, frequent meals instead of large meals.  Wear loose-fitting clothing. Do not wear anything tight around your waist that causes pressure on your stomach.  Raise the head of your bed 6 to 8 inches with wood blocks to help you sleep. Extra pillows will not help.  Only take over-the-counter or prescription  medicines for pain, discomfort, or fever as directed by your caregiver.  Do not take aspirin, ibuprofen, or other nonsteroidal anti-inflammatory drugs (NSAIDs). SEEK IMMEDIATE MEDICAL CARE IF:   You have pain in your arms, neck, jaw, teeth, or back.  Your pain increases or changes in intensity or duration.  You develop nausea, vomiting, or sweating (diaphoresis).  You develop shortness of breath, or you faint.  Your vomit is green, yellow, black, or looks like coffee grounds or blood.  Your stool is red, bloody, or black. These symptoms could be signs of other problems, such as heart disease, gastric bleeding, or esophageal bleeding. MAKE SURE YOU:   Understand these instructions.  Will watch your condition.  Will get help right away if you are not doing well or get  worse. Document Released: 02/15/2005 Document Revised: 07/31/2011 Document Reviewed: 11/25/2010 [facility] Patient Information 2015 [place], [place]. This information is not intended to replace advice given to you by your health care provider. Make sure you discuss any questions you have with your health care provider.  Diverticulosis Diverticulosis is the condition that develops when small pouches (diverticula) form in the wall of your colon. Your colon, or large intestine, is where water is absorbed and stool is formed. The pouches form when the inside layer of your colon pushes through weak spots in the outer layers of your colon. CAUSES  No one knows exactly what causes diverticulosis. RISK FACTORS  Being older than 66. Your risk for this condition increases with age. Diverticulosis is rare in people younger than 40 years. By age 73, almost everyone has it.  Eating a low-fiber diet.  Being frequently constipated.  Being overweight.  Not getting enough exercise.  Smoking.  Taking over-the-counter pain medicines, like aspirin and ibuprofen. SYMPTOMS  Most people with diverticulosis do not have symptoms. DIAGNOSIS  Because diverticulosis often has no symptoms, health care providers often discover the condition during an exam for other colon problems. In many cases, a health care provider will diagnose diverticulosis while using a flexible scope to examine the colon (colonoscopy). TREATMENT  If you have never developed an infection related to diverticulosis, you may not need treatment. If you have had an infection before, treatment may include:  Eating more fruits, vegetables, and grains.  Taking a fiber supplement.  Taking a live bacteria supplement (probiotic).  Taking medicine to relax your colon. HOME CARE INSTRUCTIONS   Drink at least 6-8 glasses of water each day to prevent constipation.  Try not to strain when you have a bowel movement.  Keep all follow-up  appointments. If you have had an infection before:  Increase the fiber in your diet as directed by your health care provider or dietitian.  Take a dietary fiber supplement if your health care provider approves.  Only take medicines as directed by your health care provider. SEEK MEDICAL CARE IF:   You have abdominal pain.  You have bloating.  You have cramps.  You have not gone to the bathroom in 3 days. SEEK IMMEDIATE MEDICAL CARE IF:   Your pain gets worse.  Yourbloating becomes very bad.  You have a fever or chills, and your symptoms suddenly get worse.  You begin vomiting.  You have bowel movements that are bloody or black. MAKE SURE YOU:  Understand these instructions.  Will watch your condition.  Will get help right away if you are not doing well or get worse. Document Released: 02/03/2004 Document Revised: 05/13/2013 Document Reviewed: 04/02/2013 [facility] Patient Information 2015 [place],  [employer]. This information is not intended to replace advice given to you by your health care provider. Make sure you discuss any questions you have with your health care provider.

## 2014-10-02 NOTE — Interval H&P Note (Signed)
History and Physical Interval Note:  10/02/2014 7:39 AM  Harry Franklin  has presented today for surgery, with the diagnosis of poor prep, dysphagia screening TCS  The various methods of treatment have been discussed with the patient and family. After consideration of risks, benefits and other options for treatment, the patient has consented to  Procedure(s) with comments: COLONOSCOPY (N/A) - 730 ESOPHAGOGASTRODUODENOSCOPY (EGD) (N/A) SAVORY DILATION (N/A) MALONEY DILATION (N/A) as a surgical intervention .  The patient's history has been reviewed, patient examined, no change in status, stable for surgery.  I have reviewed the patient's chart and labs.  Questions were answered to the patient's satisfaction.     Harry Franklin  No change. Actually, esophageal dysphagia has worsened somewhat since March. Plan for EGD with esophageal dilation and screening colonoscopy per plan today.The risks, benefits, limitations, imponderables and alternatives regarding both EGD and colonoscopy have been reviewed with the patient. Questions have been answered. All parties agreeable.

## 2014-10-02 NOTE — H&P (View-Only) (Signed)
Referring Provider: Bernerd Limbo, MD Primary Care Physician:  [name], MD Primary GI: Dr. Gala Romney  Chief Complaint  Patient presents with  . set up TCS/ update H&P    HPI:   74 year old male presents for 30 to have dictated H&P prior procedure. Last colonoscopy 03/31/2005 with fair prep on Miralax prep and retained stool and limited view of potential polyps 5 mm or less. No polyps seen, noted diverticulum. Recommended 5 year repeat due to prep quality.  Today he states he's been doing well. Had to postpone colonoscopy due to scheduling. Has changed his GERD medication from omeprazole to ranitidine and is doing well with this. Denies abdominal pain, N/V, chest pain, dyspnea, fever/chills, unintentional weight loss, hematochezia, melena, bowel changes. Denies any other upper or lower GI symptoms.  Past Medical History  Diagnosis Date  . GERD (gastroesophageal reflux disease)   . Hypokalemia   . Hypertension   . Cataract   . Arthritis     Past Surgical History  Procedure Laterality Date  . Replacement total knee    . Appendectomy    . Hammer toe surgery Left   . Lapidus procedure    . Hallux fusion    . Colonoscopy  03/31/05    Current Outpatient Prescriptions  Medication Sig Dispense Refill  . aspirin 325 MG tablet Take 325 mg by mouth daily.    . furosemide (LASIX) 20 MG tablet Take 20 mg by mouth. Takes one tablet a day x 4 days a week    . metoprolol (LOPRESSOR) 100 MG tablet     . Multiple Vitamins-Minerals (CENTRUM SILVER PO) Take 1 tablet by mouth daily.    . potassium chloride SA (K-DUR,KLOR-CON) 20 MEQ tablet Take 20 mEq by mouth once.     . ranitidine (ZANTAC) 150 MG capsule Take 150 mg by mouth 2 (two) times daily.    . NON FORMULARY Gas X    One tablet daily    . omeprazole (PRILOSEC) 40 MG capsule Take 40 mg by mouth daily.    . polyethylene glycol-electrolytes (NULYTELY/GOLYTELY) 420 G solution Take 4,000 mLs by mouth once. (Patient not taking:  Reported on 09/28/2014) 4000 mL 0  . Probiotic Product (ALIGN PO) Take 1 tablet by mouth daily.     No current facility-administered medications for this visit.    Allergies as of 09/28/2014 - Review Complete 09/28/2014  Allergen Reaction Noted  . Rabeprazole  02/13/2014  . Vancomycin Hives and Rash 11/23/2012    Family History  Problem Relation Age of Onset  . Colon cancer Maternal Uncle   . Heart disease Father     History   Social History  . Marital Status: Married    Spouse Name: N/A  . Number of Children: N/A  . Years of Education: N/A   Social History Main Topics  . Smoking status: Former Smoker    Quit date: 01/22/1984  . Smokeless tobacco: Not on file     Comment: qUIT IN 1985  . Alcohol Use: No  . Drug Use: No  . Sexual Activity: Not on file   Other Topics Concern  . None   Social History Narrative    Review of Systems: General: Negative for anorexia, weight loss, fever, chills. ENT: Negative for hoarseness, difficulty swallowing , nasal congestion. CV: Negative for chest pain, angina, palpitations, peripheral edema.  Respiratory: Negative for dyspnea at rest, dyspnea on exertion, sputum, wheezing.  GI: See history of present illness. MS: Negative for joint pain,  low back pain.  Derm: Negative for rash or itching.  Neuro: Negative for weakness, seizure, memory loss, confusion.  Psych: Negative for anxiety, depression, hallucinations.  Endo: Negative for unusual weight change.  Heme: Negative for bruising or bleeding. Allergy: Negative for rash or hives.   Physical Exam: BP 143/80 mmHg  Pulse 70  Temp(Src) 97 F (36.1 C)  Ht 6\' 5"  (1.956 m)  Wt 312 lb 3.2 oz (141.613 kg)  BMI 37.01 kg/m2 General: Alert and oriented. Pleasant and cooperative. Well-nourished and well-developed.  Head: Normocephalic and atraumatic. Eyes: Without icterus, sclera clear and conjunctiva pink.  Ears: Normal auditory acuity. Wears hearing aids  bilaterally. Lungs: Clear to auscultation bilaterally. No wheezes, rales, or rhonchi. No distress.  Heart: S1, S2 present without murmurs appreciated.  Abdomen: +BS, soft, non-tender and non-distended. No HSM noted. No guarding or rebound. No masses appreciated.  Rectal: Deferred  Msk: Symmetrical without gross deformities. Normal posture. Extremities: Without clubbing or edema. Neurologic: Alert and oriented x4; grossly normal neurologically. Skin: Intact without significant lesions or rashes. Psych: Alert and cooperative. Normal mood and affect.   09/28/2014 8:45 AM

## 2014-10-02 NOTE — Op Note (Addendum)
[facility] [address] [place], [address]   COLONOSCOPY PROCEDURE REPORT  PATIENT: Harry Franklin, Harry Franklin  MR#: [id number] BIRTHDATE: [birth year] , 73  yrs. old GENDER: male ENDOSCOPIST: R.  Garfield Cornea, MD FACP [facility] REFERRED EX:HBZJI Coletta Memos, M.D. PROCEDURE DATE:  Oct 23, 2014 PROCEDURE:   Ileo-colonoscopy, with biopsy with biopsy INDICATIONS:Average risk colorectal cancer screening examination. MEDICATIONS: Versed 7 mg IV and Demerol 100 mg IV in divided doses. Zofran 4 mg IV. ASA CLASS:       Class II  CONSENT: The risks, benefits, alternatives and imponderables including but not limited to bleeding, perforation as well as the possibility of a missed lesion have been reviewed.  The potential for biopsy, lesion removal, etc. have also been discussed. Questions have been answered.  All parties agreeable.  Please see the history and physical in the medical record for more information.  DESCRIPTION OF PROCEDURE:   After the risks benefits and alternatives of the procedure were thoroughly explained, informed consent was obtained.  The digital rectal exam revealed no abnormalities of the rectum.   The EG-[id number] ([id number])  endoscope was introduced through the anus and advanced to the terminal ileum which was intubated for a short distance. No adverse events experienced.   The quality of the prep was adequate  The instrument was then slowly withdrawn as the colon was fully examined.      COLON FINDINGS: Normal-appearing rectal mucosa.  Aside from a single diminutive polyp in at 7 cm from anal verge.  Scattered left-sided diverticula; the remainder of the colonic mucosa appeared normal. The distal 5 cm of terminal ileal mucosa also appeared normal. Retroflexion was performed. .  The rectal polyp was cold biopsied/removed.  Withdrawal time=8 minutes 0 seconds.  The scope was withdrawn and the procedure completed. COMPLICATIONS: There were no immediate  complications.  ENDOSCOPIC IMPRESSION: Colonic diverticulosis.     Single diminutive rectal polyp?"removed as described above.  RECOMMENDATIONS: Follow-up on pathology.  See EGD report.  eSigned:  R. Garfield Cornea, MD Rosalita Chessman [facility] 2014/10/23 9:23 AM Revised: October 23, 2014 9:23 AM  cc:  CPT CODES: ICD CODES:  The ICD and CPT codes recommended by this software are interpretations from the data that the clinical staff has captured with the software.  The verification of the translation of this report to the ICD and CPT codes and modifiers is the sole responsibility of the health care institution and practicing physician where this report was generated.  [place]. will not be held responsible for the validity of the ICD and CPT codes included on this report.  AMA assumes no liability for data contained or not contained herein. CPT is a Designer, television/film set of the [employer].  PATIENT NAME:  Harry Franklin, Harry Franklin MR#: [id number]

## 2014-10-05 ENCOUNTER — Encounter (HOSPITAL_COMMUNITY): Payer: Self-pay | Admitting: Internal Medicine

## 2014-10-06 ENCOUNTER — Encounter: Payer: Self-pay | Admitting: Internal Medicine

## 2014-10-06 ENCOUNTER — Telehealth: Payer: Self-pay

## 2014-10-06 NOTE — Telephone Encounter (Signed)
Per RMR- Send letter to patient.  Send copy of letter with path to referring provider and PCP.   Mild atypia on bx's; needs bid ppi x 3 mos with a f/u appt at that time to schedule a repeat egd w bx

## 2014-10-06 NOTE — Telephone Encounter (Signed)
Dr.Rourk, you gave him dexilant after his procedure, do you want him to take dexilant bid?

## 2014-10-06 NOTE — Telephone Encounter (Signed)
Letter mailed to the pt. 

## 2014-10-06 NOTE — Telephone Encounter (Signed)
APPOINTMENT MADE AND LETTER SENT °

## 2014-10-07 NOTE — Telephone Encounter (Signed)
All things considered, Dexilant 60 mg once daily should suffice

## 2014-10-08 NOTE — Progress Notes (Signed)
CC'ED TO PCP 

## 2014-10-12 NOTE — Telephone Encounter (Signed)
Pt called this am and states that the dexilant is working great but he has developed diarrhea with it. Please advise.

## 2014-10-15 NOTE — Telephone Encounter (Signed)
Pt is aware. He already has an rx for zantac. He will call me if he has any problems between now and his office visit.

## 2014-10-15 NOTE — Telephone Encounter (Signed)
I wonder if it's a coincidence or if it's a side effect. I recommend he continue it through until next week and if he still has diarrhea, call us back.

## 2014-10-15 NOTE — Telephone Encounter (Signed)
I spoke with the pt- he said he had multiple episodes of watery diarrhea after taking the dexilant. He quit taking it Saturday. He started taking zantac on Tuesday and has been feeling much better. He tried aciphex in the past and it caused diarrhea as well. Pt has omeprazole 40mg  at home but is concerned about taking a ppi after reading about it causing kidney failure and dementia. He is not sure if he wants to take a PPI or not. He said if RMR really felt like he needed it, he would take it. Pt is watching his diet and has already lost almost 16 pounds (intentionally). He said he is feeling good right now. He is aware that he has a follow up visit in August.   Pt wants to know if he can continue zantac or does he have to be on a PPI?

## 2014-10-15 NOTE — Telephone Encounter (Signed)
He should be commended on weight loss and dietary modification. Patient had severe reflux esophagitis. A PPI would be best, however, it sounds like he's intolerant to them.  Next best approach would be Zantac 150 mg orally twice a day. Okay to call a prescription in for 60 per month with 11 refills

## 2014-10-15 NOTE — Telephone Encounter (Signed)
Tried to call pt- NA and no voicemail. 

## 2014-11-12 ENCOUNTER — Encounter: Payer: Self-pay | Admitting: Podiatry

## 2014-11-12 ENCOUNTER — Ambulatory Visit (INDEPENDENT_AMBULATORY_CARE_PROVIDER_SITE_OTHER): Payer: Medicare HMO | Admitting: Podiatry

## 2014-11-12 ENCOUNTER — Ambulatory Visit: Payer: Self-pay

## 2014-11-12 VITALS — BP 130/68 | HR 78 | Temp 98.6°F | Resp 16

## 2014-11-12 DIAGNOSIS — L600 Ingrowing nail: Secondary | ICD-10-CM

## 2014-11-12 MED ORDER — AMOXICILLIN-POT CLAVULANATE 875-125 MG PO TABS: 1.0000 | ORAL_TABLET | Freq: Two times a day (BID) | ORAL | Status: DC

## 2014-11-12 NOTE — Patient Instructions (Signed)

## 2014-11-12 NOTE — Progress Notes (Signed)
He presents today after having had a bleeding hallux left for the past couple of weeks. He states that I think my toe mostly rubbing him a shoe causing my nail plate. He has previously had matrixectomy's to this hallux left foot he has also had an hallux IPJ fusion. He denies fever chills nausea vomiting muscle aches and pains and has not taken any on probiotics for this. He has cut a hole in his shoe however so there is toe will not rub.  Objective: Vital signs are stable he is alert and oriented 3. Pulses are palpable. Hallux left does demonstrate a superficial nail which covers the nailbed and area of purulence to the distal medial tuft. I debrided this today after local anesthesia was administered and it does seem to probe deep toward the tuft of the toe. Radiographs taken today do demonstrate what appears to be a osteomyelitis of the hallux.  Assessment: Abscess hallux left. Osteomyelitis hallux left. With internal fixation.  Plan: Discussed etiology pathology conservative versus surgical therapies. Started him on Augmentin 875 mg twice daily. Performed a incision and drainage after local anesthesia was achieved. He was so soaking twice daily at's is also warm water and I will follow-up with him in 2 weeks at which time we will discuss surgical intervention consisting of screw removal.

## 2014-11-26 ENCOUNTER — Ambulatory Visit (INDEPENDENT_AMBULATORY_CARE_PROVIDER_SITE_OTHER): Payer: Medicare HMO | Admitting: Podiatry

## 2014-11-26 ENCOUNTER — Telehealth: Payer: Self-pay | Admitting: *Deleted

## 2014-11-26 ENCOUNTER — Other Ambulatory Visit: Payer: Self-pay | Admitting: Podiatry

## 2014-11-26 VITALS — BP 123/75 | HR 74 | Temp 99.6°F | Resp 16

## 2014-11-26 DIAGNOSIS — L89891 Pressure ulcer of other site, stage 1: Secondary | ICD-10-CM

## 2014-11-26 DIAGNOSIS — L97529 Non-pressure chronic ulcer of other part of left foot with unspecified severity: Secondary | ICD-10-CM

## 2014-11-26 MED ORDER — AMOXICILLIN-POT CLAVULANATE 875-125 MG PO TABS: 1.0000 | ORAL_TABLET | Freq: Two times a day (BID) | ORAL | Status: DC

## 2014-11-26 NOTE — Progress Notes (Signed)
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 He states that he took all of his ends biotics which rendered the great toe clear and pain-free. That has completely gone away. He says that was going on with my third toe but over the past few days my third toes been some more and I been running a low-grade fever. He states that he just finished taking his anti-biotics over the weekend. He denies any trauma to the third toe of the left foot.  Objective: Blood pressure is 123/75 however his oral temperature is 99.36F. Pulses are palpable left. Hallux left appears to have gone on to heal uneventfully however the third digit of the left foot doesn't straight a distal ulceration and a medial blister with cellulitis extending to the level of the mid diaphyseal region of the metatarsal. The foot is mildly tender on palpation there is no purulence mild odor present probable strep.  Assessment well-healing abscess hallux left cellulitis with ulceration which does not probe to bone distal aspect third toe left foot.  Plan: A culture and sensitivity was taken today and sent to the lab. I started him on Augmentin 875 mg 1 by mouth twice a day. We marked the area of cellulitis and he is to watch for any signs of worsening. Should his fever increase he will follow-up with [place] or the emergency department. I debrided the area today and place a dry sterile compressive dressing and instructed him on how to change the dressing. He is to utilize his Darco shoe wedge that he has at home. We will follow-up with him in 1 week. We still need to remove the screw from the hallux.

## 2014-11-26 NOTE — Telephone Encounter (Signed)
Dr. Milinda Pointer ordered wound culture and sensitivity of left foot.

## 2014-11-27 ENCOUNTER — Telehealth: Payer: Self-pay | Admitting: *Deleted

## 2014-11-28 LAB — WOUND CULTURE: Gram Stain: NONE SEEN

## 2014-12-01 ENCOUNTER — Telehealth: Payer: Self-pay | Admitting: *Deleted

## 2014-12-01 MED ORDER — CLINDAMYCIN HCL 300 MG PO CAPS
300.0000 mg | ORAL_CAPSULE | Freq: Three times a day (TID) | ORAL | Status: DC
Start: 2014-12-01 — End: 2014-12-10

## 2014-12-01 NOTE — Telephone Encounter (Signed)
Dr. Jacqualyn Posey reviewed wound culture results of 11/26/2014 and added Clindamycin 300mg  # 30 1 capsule tid.  Orders to pt and to [facility].

## 2014-12-01 NOTE — Telephone Encounter (Signed)
Entered in error

## 2014-12-01 NOTE — Telephone Encounter (Signed)
Pt states he thought Dr. Jacqualyn Posey wanted him to continue the Augmentin and begin the Clindamycin.  I informed pt he was to take both medications.  Pt states understanding.

## 2014-12-03 ENCOUNTER — Ambulatory Visit (INDEPENDENT_AMBULATORY_CARE_PROVIDER_SITE_OTHER): Payer: Medicare HMO | Admitting: Podiatry

## 2014-12-03 VITALS — BP 122/83 | HR 80 | Resp 16

## 2014-12-03 DIAGNOSIS — L97529 Non-pressure chronic ulcer of other part of left foot with unspecified severity: Secondary | ICD-10-CM | POA: Diagnosis not present

## 2014-12-03 DIAGNOSIS — L03032 Cellulitis of left toe: Secondary | ICD-10-CM | POA: Diagnosis not present

## 2014-12-03 NOTE — Progress Notes (Signed)
Dr Jacqualyn Posey reviewed lab

## 2014-12-03 NOTE — Patient Instructions (Signed)
Finish course of antibiotics Continue daily dressing changes with antibiotic ointment and a bandage Monitor for any signs/symptoms of infection. Call the office immediately if any occur or go directly to the emergency room. Call with any questions/concerns.

## 2014-12-06 ENCOUNTER — Encounter: Payer: Self-pay | Admitting: Podiatry

## 2014-12-06 NOTE — Progress Notes (Signed)
Patient ID: Harry Franklin, male   DOB: 04/14/1941, 74 y.o.   MRN: [id number]  Subjective: 74 year old male presents the office for follow-up evaluation of left  Third digit cellulitis and ulceration. He states that he is continued on the Augmentin. Also I reviewed his cultures early in the week and prescribed clindamycin due to culture results. He states after sinus clindamycin the swelling significantly improved. He no longer has the pain to the area which she did previously. He denies any systemic complaints as fevers, chills, nausea, vomiting. Denies any calf pain, chest pain, shortness of breath. No other complaints at this time.  Objective: AAO 3, NAD DP/PT pulses are palpable, CRT less than 3 seconds There is mild edema and erythema to the left third digit however there is no ascending cellulitis. The distal aspect of the digit there is a superficial granular ulceration measuring approximately 0.2 x 0.2 cm. There is no probe to bone, undermining, tunneling. Subjectively the patient states that the area has improved significantly. There is no drainage or purulence expressed. There is no malodor. Hammertoe contractures No other areas of edema, erythema, increase in warmth No other open lesions or pre-ulcerative lesions No pain with calf compression, swelling, warmth, erythema.   Assessment: 74 year old male with apparently resolving cellulitis , improving ulceration third digit left foot  Plan: -Treatment options discussed including all alternatives, risks, and complications -Continue with Augmentin and clindamycin. -Continue daily dressing changes with antibiotic ointment and a bandage overlying the wound to the left third toe daily. -Monitor for any clinical signs or symptoms of worsening infection and directed to call the office immediately should any occur or go to the ER. -Follow-up 10 days or sooner if any problems arise. In the meantime, encouraged to call the office with any  questions, concerns, change in symptoms.    Celesta Gentile

## 2014-12-10 ENCOUNTER — Ambulatory Visit (INDEPENDENT_AMBULATORY_CARE_PROVIDER_SITE_OTHER): Payer: Medicare HMO | Admitting: Podiatry

## 2014-12-10 ENCOUNTER — Encounter: Payer: Self-pay | Admitting: Podiatry

## 2014-12-10 VITALS — BP 114/76 | HR 73 | Resp 17

## 2014-12-10 DIAGNOSIS — L97521 Non-pressure chronic ulcer of other part of left foot limited to breakdown of skin: Secondary | ICD-10-CM

## 2014-12-10 DIAGNOSIS — L03032 Cellulitis of left toe: Secondary | ICD-10-CM | POA: Diagnosis not present

## 2014-12-10 DIAGNOSIS — L84 Corns and callosities: Secondary | ICD-10-CM | POA: Diagnosis not present

## 2014-12-10 MED ORDER — CLINDAMYCIN HCL 300 MG PO CAPS: 300.0000 mg | ORAL_CAPSULE | Freq: Three times a day (TID) | ORAL | Status: DC

## 2014-12-10 MED ORDER — AMOXICILLIN-POT CLAVULANATE 875-125 MG PO TABS: 1.0000 | ORAL_TABLET | Freq: Two times a day (BID) | ORAL | Status: DC

## 2014-12-10 NOTE — Progress Notes (Signed)
Patient ID: ATHOL BOLDS, male   DOB: 1941-04-28, 74 y.o.   MRN: [id number]  Subjective: 74 year old male presents the office for follow-up evaluation of left third digit cellulitis and ulceration. He states that he is continued on the clindamycin although he has finished the augmentin. He was applying antibiotic ointment and a bandage overlying the ulceration daily however he does state that he discontinue that as he believe the wound is healed. He does that he has some continued swelling and redness of the left third toe although it does appear to be improving. He also states he has noticed a callus form on the right foot. Denies any redness or drainage from this area. He denies any systemic complaints as fevers, chills, nausea, vomiting. Denies any calf pain, chest pain, shortness of breath. No other complaints at this time.  Objective: AAO 3, NAD DP/PT pulses are palpable, CRT less than 3 seconds There is mild edema and erythema to the left third digit however there is no ascending cellulitis. The edema and erythema appear to be somewhat improved. The distal aspect of the digit there is a superficial granular ulceration measuring approximately 0.2 x 0.1 cm. There is no probe to bone, undermining, tunneling. No areas of fluctuance or crepitus. There is no drainage or purulence expressed. There is no malodor. Hammertoe contractures Right foot submetatarsal one hyperkeratotic lesion. Upon debridement no underlying ulceration, drainage or other clinical signs of infection. There is a pre-ulcerative site to the right distal hallux and the right foot as well. There appears to be some eructation however there is no definitive skin breakdown at this time. No other areas of edema, erythema, increase in warmth No other open lesions or pre-ulcerative lesions No pain with calf compression, swelling, warmth, erythema.   Assessment: 74 year old male with apparently resolving cellulitis  improving ulceration  third digit left foot  Plan: -Treatment options discussed including all alternatives, risks, and complications -Continue with Augmentin and clindamycin. These are both refill today. -Wound sharply debrided without, complications. He was debrided to healthy, bleeding, granular tissue. Iron is or was applied followed by dry sterile dressing. -Continue daily dressing changes with antibiotic ointment and a bandage overlying the wound to the left third toe daily. -Monitor for any clinical signs or symptoms of worsening infection and directed to call the office immediately should any occur or go to the ER. -Follow-up in 1 week or sooner if any problems arise. In the meantime, encouraged to call the office with any questions, concerns, change in symptoms.   Celesta Gentile

## 2014-12-10 NOTE — Patient Instructions (Signed)
Monitor for any signs/symptoms of infection. Call the office immediately if any occur or go directly to the emergency room. Call with any questions/concerns.  

## 2014-12-17 ENCOUNTER — Ambulatory Visit (INDEPENDENT_AMBULATORY_CARE_PROVIDER_SITE_OTHER): Payer: Medicare HMO

## 2014-12-17 ENCOUNTER — Ambulatory Visit (INDEPENDENT_AMBULATORY_CARE_PROVIDER_SITE_OTHER): Payer: Medicare HMO | Admitting: Podiatry

## 2014-12-17 DIAGNOSIS — L97521 Non-pressure chronic ulcer of other part of left foot limited to breakdown of skin: Secondary | ICD-10-CM

## 2014-12-17 DIAGNOSIS — L89891 Pressure ulcer of other site, stage 1: Secondary | ICD-10-CM

## 2014-12-17 DIAGNOSIS — L03032 Cellulitis of left toe: Secondary | ICD-10-CM | POA: Diagnosis not present

## 2014-12-17 MED ORDER — LEVOFLOXACIN 500 MG PO TABS: 500.0000 mg | ORAL_TABLET | Freq: Every day | ORAL | Status: DC

## 2014-12-17 NOTE — Patient Instructions (Signed)
Start the new antibiotic Continue daily dressing changes.  Monitor for any signs/symptoms of infection. Call the office immediately if any occur or go directly to the emergency room. Call with any questions/concerns.

## 2014-12-17 NOTE — Progress Notes (Signed)
Patient ID: Harry Franklin, male   DOB: 1940-11-27, 74 y.o.   MRN: [id number]  Subjective: Harry Franklin presents the office for follow-up evaluation of left third digit cellulitis and ulceration. He has continued antibiotic. He also needs to apply anaerobic ointment and a bandage overlying the toe daily. He states that the area discontinued be somewhat swollen and red otherwise he thinks he is doing well. He denies any drainage from around the wound.  He denies any systemic complaints as fevers, chills, nausea, vomiting. Denies any calf pain, chest pain, shortness of breath. No other complaints at this time.  Objective: AAO 3, NAD DP/PT pulses are palpable, CRT less than 3 seconds There is mild continued edema and erythema to the left third digit however there is no ascending cellulitis.  The edema and erythema. Be the same as compared to last appointment.The distal aspect of the digit there is a superficial granular ulceration measuring approximately 0.1 x 0.1 cm. There is no probe to bone, undermining, tunneling. No areas of fluctuance or crepitus. There is no drainage or purulence expressed. There is no malodor. The wound does appear to be improved compared to last appointment. Hammertoe contractures No other areas of edema, erythema, increase in warmth No other open lesions or pre-ulcerative lesions No pain with calf compression, swelling, warmth, erythema.   Assessment: 74 year old male with continued edema ,cellulitis  Left third toe.  Plan: -X-rays were obtained and reviewed with the patient. No definitve changes concerning for OM at this time.  -Treatment options discussed including all alternatives, risks, and complications - As others not been much change since last appointment I would discontinue the Augmentin and Cipro and will start Levaquin. This was sent to his pharmacy. -Wound sharply debrided without, complications. He was debrided to healthy, bleeding, granular tissue.  Iodoscorb was applied followed by dry sterile dressing. -Continue daily dressing changes with antibiotic ointment and a bandage overlying the wound to the left third toe daily. -Monitor for any clinical signs or symptoms of worsening infection and directed to call the office immediately should any occur or go to the ER. -Follow-up in 10 days or sooner if any problems arise. In the meantime, encouraged to call the office with any questions, concerns, change in symptoms.   Harry Franklin

## 2014-12-18 ENCOUNTER — Telehealth: Payer: Self-pay | Admitting: *Deleted

## 2014-12-18 NOTE — Telephone Encounter (Signed)
"  I saw Dr. Jacqualyn Posey yesterday.  He changed my antibiotics.  It's really working!  It's doing a good job.  Just let him know that, thank you."

## 2014-12-29 ENCOUNTER — Ambulatory Visit (INDEPENDENT_AMBULATORY_CARE_PROVIDER_SITE_OTHER): Payer: Medicare HMO | Admitting: Podiatry

## 2014-12-29 DIAGNOSIS — L97521 Non-pressure chronic ulcer of other part of left foot limited to breakdown of skin: Secondary | ICD-10-CM | POA: Diagnosis not present

## 2014-12-29 DIAGNOSIS — L03032 Cellulitis of left toe: Secondary | ICD-10-CM | POA: Diagnosis not present

## 2014-12-29 MED ORDER — CEPHALEXIN 500 MG PO CAPS: 500.0000 mg | ORAL_CAPSULE | Freq: Three times a day (TID) | ORAL | Status: DC

## 2014-12-29 NOTE — Progress Notes (Signed)
Patient ID: Harry Franklin, male   DOB: Aug 30, 1940, 74 y.o.   MRN: [id number]  Subjective: Harry Franklin presents the office for follow-up evaluation of left third digit cellulitis and ulceration. He has continued on levaquin which he was changed to last appointment. He also has been applying antibiotic ointment and a bandage daily to the wound. He believes that he is doing better than compared to last appointment. He denies any drainage or purulence from around the wound. There is still some redness to the toe, although he believes it is from the rubbing due to the underlying hammertoe. He denies any systemic complaints as fevers, chills, nausea, vomiting. Denies any calf pain, chest pain, shortness of breath. No other complaints at this time.  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 There is mild edema and erythema to the left third toe although it does appear somewhat improvement last appointment. Erythema mostly is on the medial aspect. There is no ascending cellulitis. The erythema only extends just to the PIPJ. At the distal aspect of the toe there is a superficial granular ulceration measuring 0.2 x 0.2 cm after debridement. The  periwound is hyperkeratotic. There is no probing, undermining, tunneling. There is no drainage or purulence, no malodor. No other areas of edema, erythema, increase in warmth No other open lesions or pre-ulcerative lesions No pain with calf compression, swelling, warmth, erythema.   Assessment: 74 year old male with continued edema, continued erythema left third toe.  Plan: -Treatment options discussed including all alternatives, risks, and complications -wound sharply debrided without complications to healthy bleeding granular wound base. Silvadene was applied followed by dry sterile dressing. Continue daily dressing changes with antibiotic ointment and a  bandage. -Dispensed a toe crest help offload the wound. -At this time the patient is requesting amputation of his toe as he has had multiple issues with his toe for several years. I discussed indication the results and other hammertoe contractures or other problems. Also discussed the possible hammertoe contracture with erythema results. Likely want to pursue indication the toe. At next appointment symptoms continue we'll likely schedule for amputation and possible heart removal of the hallux. -Prescribed Keflex to continue erythematous if this will help. He would like to hold off antibiotics for couple days. Discussed this could worsen the infection. It is a worsening to call the office immediately. -Monitor for any clinical signs or symptoms of worsening infection and directed to call the office immediately should any occur or go to the ER. -Follow-up in 10 days or sooner if any problems arise. In the meantime, encouraged to call the office with any questions, concerns, change in symptoms.   Harry Franklin

## 2015-01-05 ENCOUNTER — Ambulatory Visit (INDEPENDENT_AMBULATORY_CARE_PROVIDER_SITE_OTHER): Payer: Medicare HMO | Admitting: Podiatry

## 2015-01-05 ENCOUNTER — Encounter: Payer: Self-pay | Admitting: Podiatry

## 2015-01-05 VITALS — BP 155/87 | HR 73 | Resp 18

## 2015-01-05 DIAGNOSIS — L03032 Cellulitis of left toe: Secondary | ICD-10-CM | POA: Diagnosis not present

## 2015-01-05 DIAGNOSIS — L97521 Non-pressure chronic ulcer of other part of left foot limited to breakdown of skin: Secondary | ICD-10-CM

## 2015-01-05 NOTE — Patient Instructions (Signed)
Pre-Operative Instructions  Congratulations, you have decided to take an important step to improving your quality of life.  You can be assured that the doctors of [facility] will be with you every step of the way.  1. Plan to be at the surgery center/hospital at least 1 (one) hour prior to your scheduled time unless otherwise directed by the surgical center/hospital staff.  You must have a responsible adult accompany you, remain during the surgery and drive you home.  Make sure you have directions to the surgical center/hospital and know how to get there on time. 2. For hospital based surgery you will need to obtain a history and physical form from your family physician within 1 month prior to the date of surgery- we will give you a form for you primary physician.  3. We make every effort to accommodate the date you request for surgery.  There are however, times where surgery dates or times have to be moved.  We will contact you as soon as possible if a change in schedule is required.   4. No Aspirin/Ibuprofen for one week before surgery.  If you are on aspirin, any non-steroidal anti-inflammatory medications (Mobic, Aleve, Ibuprofen) you should stop taking it 7 days prior to your surgery.  You make take Tylenol  For pain prior to surgery.  5. Medications- If you are taking daily heart and blood pressure medications, seizure, reflux, allergy, asthma, anxiety, pain or diabetes medications, make sure the surgery center/hospital is aware before the day of surgery so they may notify you which medications to take or avoid the day of surgery. 6. No food or drink after midnight the night before surgery unless directed otherwise by surgical center/hospital staff. 7. No alcoholic beverages 24 hours prior to surgery.  No smoking 24 hours prior to or 24 hours after surgery. 8. Wear loose pants or shorts- loose enough to fit over bandages, boots, and casts. 9. No slip on shoes, sneakers are best. 10. Bring  your boot with you to the surgery center/hospital.  Also bring crutches or a walker if your physician has prescribed it for you.  If you do not have this equipment, it will be provided for you after surgery. 11. If you have not been contracted by the surgery center/hospital by the day before your surgery, call to confirm the date and time of your surgery. 12. Leave-time from work may vary depending on the type of surgery you have.  Appropriate arrangements should be made prior to surgery with your employer. 13. Prescriptions will be provided immediately following surgery by your doctor.  Have these filled as soon as possible after surgery and take the medication as directed. 14. Remove nail polish on the operative foot. 15. Wash the night before surgery.  The night before surgery wash the foot and leg well with the antibacterial soap provided and water paying special attention to beneath the toenails and in between the toes.  Rinse thoroughly with water and dry well with a towel.  Perform this wash unless told not to do so by your physician.  Enclosed: 1 Ice pack (please put in freezer the night before surgery)   1 Hibiclens skin cleaner   Pre-op Instructions  If you have any questions regarding the instructions, do not hesitate to call our office.  [place]: [address] [place], [place] [address] [phone]  [place]: [address]  [place], [place] [address] [phone]  [place]: [address]  [place], [place] [address] [phone]  Dr. Richard   Tuchman DPM, Dr. Norman Regal DPM Dr. Richard Sikora DPM, Dr. M. Todd Hyatt DPM, Dr. Kathryn Egerton DPM, Dr. Matthew Wagoner DPM 

## 2015-01-06 ENCOUNTER — Ambulatory Visit: Payer: Medicare HMO | Admitting: Nurse Practitioner

## 2015-01-06 ENCOUNTER — Telehealth: Payer: Self-pay | Admitting: Nurse Practitioner

## 2015-01-06 ENCOUNTER — Encounter: Payer: Self-pay | Admitting: Nurse Practitioner

## 2015-01-06 NOTE — Telephone Encounter (Signed)
PATIENT WAS A NO SHOW AND LETTER SENT  °

## 2015-01-06 NOTE — Telephone Encounter (Signed)
Noted  

## 2015-01-07 ENCOUNTER — Encounter: Payer: Self-pay | Admitting: Podiatry

## 2015-01-07 NOTE — Progress Notes (Signed)
Patient ID: Harry Franklin, male   DOB: November 27, 1940, 74 y.o.   MRN: [id number]  Subjective: 74 year old male presents the office they for follow-up evaluation of left third digit cellulitis and ulceration of the distal aspect of the digit. She states that since approximately his remain off antibiotics and the redness has remained about the same. He has some bloody drainage to the wound of the tip of the toe, denies any pus. Denies any red streaks. At this time he is also requesting toe indication again at today's appointment. He states he has had ongoing problems with his toe with ulceration and multiple infections and it is hindering his quality of life. He'll are to proceed with toe amputation at this time. Denies any systemic complaints as fevers, chills, nausea, vomiting. Denies any calf pain, chest pain, soreness of breath. No other complaints at this time.  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 The distal aspect the left third digit there is a hyperkeratotic lesion with associated ulceration. Upon debridement there is continued ulceration of the distal aspect of the toe measuring 0.2 x 0.2 cm and remains unchanged from last appointment. Wound base is granular. There is no probe to bone, undermining, tunneling. There is mild edema to the digit with erythema to the distal aspect of the toe. There is no areas of fluctuance or crepitus. No other open lesions or pre-ulcerative lesions. There is no pain with calf compression, swelling, warmth, erythema.  Assessment: 74 year old male with continued edema, erythema, ulceration left third toe; patient requested amputation  Plan: -Treatment options discussed including all alternatives, risks, and complications. I discussed both conservative and surgical treatment options. I discussed with him to hold off and wait for the wound to heal and erythema to decrease in medial  form a hammertoe correction keep the toe. I also discussed other conservative treatments. He states he is not certain this isn't limited to go ahead and proceed with toe amputation.  -The proposed surgeries left third toe amputataion. I will try to do a partial third toe amputation. -The incision placement as well as the postoperative course was discussed with the patient. I discussed risks of the surgery which include, but not limited to, infection, bleeding, pain, swelling, need for further surgery, delayed or nonhealing, painful or ugly scar, numbness or sensation changes, over/under correction, recurrence, transfer lesions, further deformity, hardware failure, DVT/PE, loss of toe/foot. Patient understands these risks and wishes to proceed with surgery. The surgical consent was reviewed with the patient all 3 pages were signed. No promises or guarantees were given to the outcome of the procedure. All questions were answered to the best of my ability. Before the surgery the patient was encouraged to call the office if there is any further questions. The surgery will be performed at the [facility] on an outpatient basis -Recommended to restart antibiotics -He would like to have the hardware removed in the big to at the same time. Due to possible infection, I do not want to do a clean surgery at the same time as the toe amputation.   Celesta Gentile, DPM

## 2015-01-13 ENCOUNTER — Encounter: Payer: Self-pay | Admitting: Podiatry

## 2015-01-13 DIAGNOSIS — L97322 Non-pressure chronic ulcer of left ankle with fat layer exposed: Secondary | ICD-10-CM | POA: Diagnosis not present

## 2015-01-19 ENCOUNTER — Ambulatory Visit (INDEPENDENT_AMBULATORY_CARE_PROVIDER_SITE_OTHER): Payer: Medicare HMO | Admitting: Podiatry

## 2015-01-19 ENCOUNTER — Ambulatory Visit (INDEPENDENT_AMBULATORY_CARE_PROVIDER_SITE_OTHER): Payer: Medicare HMO

## 2015-01-19 ENCOUNTER — Encounter: Payer: Self-pay | Admitting: Podiatry

## 2015-01-19 ENCOUNTER — Other Ambulatory Visit: Payer: Self-pay | Admitting: Podiatry

## 2015-01-19 VITALS — BP 162/97 | HR 73 | Resp 18

## 2015-01-19 DIAGNOSIS — L03032 Cellulitis of left toe: Secondary | ICD-10-CM | POA: Diagnosis not present

## 2015-01-19 DIAGNOSIS — Z899 Acquired absence of limb, unspecified: Secondary | ICD-10-CM

## 2015-01-21 ENCOUNTER — Encounter: Payer: Self-pay | Admitting: Podiatry

## 2015-01-21 NOTE — Progress Notes (Signed)
Patient ID: Harry Franklin, male   DOB: 04/17/1941, 74 y.o.   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 He states that overall he is doing well. He states that this is the best his foot is felt in several months. He is taken antibiotics as directed. He is continuing the CAM boot. He denies any systemic complaints as fevers, chills, nausea, vomiting. No calf pain, chest pain, shortness of breath. No other complaints at this time in no acute changes otherwise.  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 Sutures are intact and the incision is well coapted without any evidence of dehiscence. There is no surrounding erythema, ascending cellulitis, fluctuation, crepitus, malodor, drainage/purulence. There appears to be decreased edema to the foot compared to prior to surgery. No other open lesions or pre-ulcerative lesions. No pain with calf compression, swelling, warmth, erythema.  Assessment: 74 year old male 1 week status post left partial toe amputation, doing well  Plan: -Treatment options discussed including all alternatives, risks, and complications -X-rays were obtained and reviewed with the patient.  -Antibiotic was placed over the incision followed by dry show dressing. Keep dressing clean, dry, intact. -Continue a surgical shoe. -Finish course of antibiotics. -Hold off on exercising for now. -Monitor for any clinical signs or symptoms of infection and directed to call the office immediately should any occur or go to the ER. -Follow-up 1 week for suture removal or sooner if any problems arise. In the meantime, encouraged to call the office with any questions, concerns, change in symptoms.   Celesta Gentile, DPM

## 2015-01-26 ENCOUNTER — Ambulatory Visit (INDEPENDENT_AMBULATORY_CARE_PROVIDER_SITE_OTHER): Payer: Medicare HMO | Admitting: Nurse Practitioner

## 2015-01-26 ENCOUNTER — Other Ambulatory Visit: Payer: Self-pay

## 2015-01-26 ENCOUNTER — Encounter: Payer: Self-pay | Admitting: Nurse Practitioner

## 2015-01-26 VITALS — BP 153/88 | HR 85 | Temp 97.3°F | Ht 76.0 in | Wt 279.2 lb

## 2015-01-26 DIAGNOSIS — K2100 Gastro-esophageal reflux disease with esophagitis, without bleeding: Secondary | ICD-10-CM

## 2015-01-26 MED ORDER — OMEPRAZOLE 20 MG PO CPDR: 20.0000 mg | DELAYED_RELEASE_CAPSULE | Freq: Two times a day (BID) | ORAL | Status: DC

## 2015-01-26 NOTE — Patient Instructions (Signed)
1. Continue taking Zantac twice a day.  2. Restart omeprazole (Prilosec). Take 20 mg twice a day. 3. 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 4. Return for follow-up in 3 months.

## 2015-01-26 NOTE — Progress Notes (Signed)
Referring Provider: Bernerd Limbo, MD Primary Care Physician:  [name], MD Primary GI:  Dr. Gala Romney  Chief Complaint  Patient presents with  . Follow-up    HPI:   74 year old male presents for follow-up post procedure. On 10/02/2014 an EGD with biopsy and Maloney dilation was performed due to GERD and recurrent esophageal dysphagia. This was completed under conscious sedation without noted complication. Patient with noted extensive geographic ulceration in the distal esophagus, severe inflammation, short benign-appearing peptic stricture without obvious tumor or Barrett's esophagus. Antral erosions. Patent pylorus. 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 He is status post Maloney dilation with 56 [place] dilator. Recommendations included stopping omeprazole, but didn't Dexilant 60 mg daily. Recommend repeat follow-up in 3 months and reschedule follow-up endoscopy with biopsy. Colonoscopy completed on the same day found clonic diverticulosis, single diminutive rectal polyp. Pathology of the polyp was hyperplastic.  Today he states he was having diarrhea with PPI. Per telephone note, patient likely intolerant to PPI and recommended Zantac 150 mg bid. Currently takes 325 mg ASA daily. Denies NSAIDs and ASA powders. Will take Tylenol as needed for headaches. Denies hematochezia, melena, abdominal pain, N/V. Was having abdominal pain and diarrhea with PPI, which resolved with stopping PPI. He states with Zantac, he is having to take 4 TUMS at night. Typically lays down for bed about 5 hours after last meal of the day. On Prilosec, feels GERD was better controlled and didn't have diarrhea. Denies chest pain, dyspnea, dizziness, lightheadedness, syncope, near syncope. Denies any other upper or lower GI symptoms.  Past Medical History  Diagnosis Date  . GERD (gastroesophageal reflux disease)   . Hypokalemia   . Hypertension   . Cataract   . Arthritis      Past Surgical History  Procedure Laterality Date  . Replacement total knee    . Appendectomy    . Hammer toe surgery Left   . Lapidus procedure    . Hallux fusion    . Colonoscopy  03/31/05  . Colonoscopy N/A 10/02/2014    CZY:[id number] diverticulosis  . Esophagogastroduodenoscopy N/A 10/02/2014    [facility]:XNATF ulcerative reflux/s/p dilation/large HH  . Maloney dilation N/A 10/02/2014    Procedure: Venia Minks DILATION;  Surgeon: Daneil Dolin, MD;  Location: [facility];  Service: Endoscopy;  Laterality: N/A;    Current Outpatient Prescriptions  Medication Sig Dispense Refill  . aspirin 325 MG tablet Take 325 mg by mouth daily.    . calcium elemental as carbonate (BARIATRIC TUMS ULTRA) 400 MG tablet     . metoprolol (LOPRESSOR) 100 MG tablet     . Multiple Vitamins-Minerals (CENTRUM SILVER PO) Take 1 tablet by mouth daily.    . Probiotic Product (ALIGN PO) Take 1 tablet by mouth daily.    . ranitidine (ZANTAC) 150 MG capsule Take 150 mg by mouth 2 (two) times daily.    . sucralfate (CARAFATE) 1 GM/10ML suspension Take 1 g by mouth 4 (four) times daily -  with meals and at bedtime.    Marland Kitchen amoxicillin-clavulanate (AUGMENTIN) 875-125 MG per tablet     . cephALEXin (KEFLEX) 500 MG capsule Take 1 capsule (500 mg total) by mouth 3 (three) times daily. (Patient not taking: Reported on 01/26/2015) 30 capsule 2  . clindamycin (CLEOCIN) 300 MG capsule     . levofloxacin (LEVAQUIN) 500 MG tablet Take 1 tablet (500 mg total) by mouth daily. (Patient not taking: Reported on 01/26/2015) 10 tablet 0  . polyethylene  glycol-electrolytes (NULYTELY/GOLYTELY) 420 G solution Take 4,000 mLs by mouth once. (Patient not taking: Reported on 01/26/2015) 4000 mL 0   No current facility-administered medications for this visit.    Allergies as of 01/26/2015 - Review Complete 01/26/2015  Allergen Reaction Noted  . Rabeprazole  02/13/2014  . Vancomycin Hives and Rash 11/23/2012    Family History  Problem Relation  Age of Onset  . Colon cancer Maternal Uncle   . Heart disease Father     Social History   Social History  . Marital Status: Married    Spouse Name: N/A  . Number of Children: N/A  . Years of Education: N/A   Social History Main Topics  . Smoking status: Former Smoker    Quit date: 01/22/1984  . Smokeless tobacco: None     Comment: qUIT IN 1985  . Alcohol Use: No  . Drug Use: No  . Sexual Activity: Not Asked   Other Topics Concern  . None   Social History Narrative    Review of Systems: General: Negative for anorexia, weight loss, fever, chills, fatigue, weakness. CV: Negative for chest pain, angina, palpitations, peripheral edema.  Respiratory: Negative for dyspnea at rest, cough, sputum, wheezing.  GI: See history of present illness. Endo: Negative for unusual weight change.  Heme: Negative for bruising or bleeding. Allergy: Negative for rash or hives.   Physical Exam: BP 153/88 mmHg  Pulse 85  Temp(Src) 97.3 F (36.3 C) (Oral)  Ht 6\' 4"  (1.93 m)  Wt 279 lb 3.2 oz (126.644 kg)  BMI 34.00 kg/m2 General:   Alert and oriented. Pleasant and cooperative. Well-nourished and well-developed.  Head:  Normocephalic and atraumatic. Eyes:  Without icterus, sclera clear and conjunctiva pink.  Cardiovascular:  S1, S2 present without murmurs appreciated. Normal pulses noted. Extremities without clubbing or edema. Respiratory:  Clear to auscultation bilaterally. No wheezes, rales, or rhonchi. No distress.  Gastrointestinal:  +BS, soft, and non-distended. Mild epigastric TTP. No HSM noted. No guarding or rebound. No masses appreciated.  Rectal:  Deferred  Skin:  Intact without significant lesions or rashes. Neurologic:  Alert and oriented x4;  grossly normal neurologically. Psych:  Alert and cooperative. Normal mood and affect. Heme/Lymph/Immune: No excessive bruising noted.    01/26/2015 2:23 PM

## 2015-01-28 ENCOUNTER — Ambulatory Visit (INDEPENDENT_AMBULATORY_CARE_PROVIDER_SITE_OTHER): Payer: Medicare HMO | Admitting: Podiatry

## 2015-01-28 ENCOUNTER — Encounter: Payer: Self-pay | Admitting: Podiatry

## 2015-01-28 DIAGNOSIS — Z899 Acquired absence of limb, unspecified: Secondary | ICD-10-CM

## 2015-01-28 NOTE — Progress Notes (Signed)
Patient ID: Harry Franklin, male   DOB: 08/19/1940, 74 y.o.   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 He states that overall he is doing well and that his foot continues to feel much better than it did up for the last month prior to surgery. He continues to wear the darco shoe. He does mow the grass and walk around quite a bit and the surgical shoe is very dirty. He denies any systemic complaints as fevers, chills, nausea, vomiting. No calf pain, chest pain, shortness of breath. No other complaints at this time in no acute changes otherwise.  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 Sutures are intact and the incision is well coapted without any evidence of dehiscence. There is no surrounding erythema, ascending cellulitis, fluctuation, crepitus, malodor, drainage/purulence. No pain over the surgical site. There appears to be trace edema to the foot compared to prior to surgery. No other open lesions or pre-ulcerative lesions. No pain with calf compression, swelling, warmth, erythema.  Assessment: 74 year old male 2 weeks status post left partial toe amputation, doing well  Plan: -Treatment options discussed including all alternatives, risks, and complications -Sutures were removed without complications.Antibiotic was placed over the incision followed by dry show dressing. Keep dressing clean, dry, intact. He can second showers monitor the incision remains coapted tomorrow. Hold off on showering is April the incision to call the office. -He can start to transition to a regular shoe as tolerated. -Hold off on exercising for now. -Monitor for any clinical signs or symptoms of infection and directed to call the  office immediately should any occur or go to the ER. -Follow-up 1 week l or sooner if any problems arise. In the meantime, encouraged to call the office with any questions, concerns, change in symptoms.   Celesta Gentile, DPM

## 2015-01-29 ENCOUNTER — Encounter (HOSPITAL_COMMUNITY): Payer: Self-pay | Admitting: *Deleted

## 2015-01-29 ENCOUNTER — Encounter (HOSPITAL_COMMUNITY)
Admission: RE | Disposition: A | Discharge status: Home / Self Care | Payer: Self-pay | Source: Ambulatory Visit | Attending: Internal Medicine

## 2015-01-29 ENCOUNTER — Ambulatory Visit (HOSPITAL_COMMUNITY)
Admission: RE | Admit: 2015-01-29 | Discharge: 2015-01-29 | Disposition: A | Discharge status: Home / Self Care | Payer: Medicare HMO | Source: Ambulatory Visit | Attending: Internal Medicine | Admitting: Internal Medicine

## 2015-01-29 DIAGNOSIS — M1990 Unspecified osteoarthritis, unspecified site: Secondary | ICD-10-CM | POA: Diagnosis not present

## 2015-01-29 DIAGNOSIS — I10 Essential (primary) hypertension: Secondary | ICD-10-CM | POA: Insufficient documentation

## 2015-01-29 DIAGNOSIS — R1310 Dysphagia, unspecified: Secondary | ICD-10-CM | POA: Diagnosis present

## 2015-01-29 DIAGNOSIS — K2100 Gastro-esophageal reflux disease with esophagitis, without bleeding: Secondary | ICD-10-CM

## 2015-01-29 DIAGNOSIS — Z7982 Long term (current) use of aspirin: Secondary | ICD-10-CM | POA: Diagnosis not present

## 2015-01-29 DIAGNOSIS — Z87891 Personal history of nicotine dependence: Secondary | ICD-10-CM | POA: Diagnosis not present

## 2015-01-29 DIAGNOSIS — K222 Esophageal obstruction: Secondary | ICD-10-CM | POA: Insufficient documentation

## 2015-01-29 DIAGNOSIS — Z79899 Other long term (current) drug therapy: Secondary | ICD-10-CM | POA: Diagnosis not present

## 2015-01-29 DIAGNOSIS — Z96659 Presence of unspecified artificial knee joint: Secondary | ICD-10-CM | POA: Diagnosis not present

## 2015-01-29 DIAGNOSIS — K449 Diaphragmatic hernia without obstruction or gangrene: Secondary | ICD-10-CM | POA: Diagnosis not present

## 2015-01-29 DIAGNOSIS — K2210 Ulcer of esophagus without bleeding: Secondary | ICD-10-CM | POA: Diagnosis not present

## 2015-01-29 HISTORY — PX: ESOPHAGOGASTRODUODENOSCOPY: SHX5428

## 2015-01-29 HISTORY — PX: ESOPHAGEAL DILATION: SHX303

## 2015-01-29 SURGERY — EGD (ESOPHAGOGASTRODUODENOSCOPY)
Anesthesia: Moderate Sedation

## 2015-01-29 MED ORDER — MIDAZOLAM HCL 5 MG/5ML IJ SOLN
INTRAMUSCULAR | Status: AC
  Filled 2015-01-29: qty 10

## 2015-01-29 MED ORDER — SODIUM CHLORIDE 0.9 % IV SOLN
INTRAVENOUS | Status: DC
  Administered 2015-01-29: 09:00:00 via INTRAVENOUS

## 2015-01-29 MED ORDER — LIDOCAINE VISCOUS 2 % MT SOLN
OROMUCOSAL | Status: DC | PRN
  Administered 2015-01-29: 3 mL via OROMUCOSAL

## 2015-01-29 MED ORDER — STERILE WATER FOR IRRIGATION IR SOLN
Status: DC | PRN
Start: 2015-01-29 — End: 2015-01-29
  Administered 2015-01-29: 09:00:00

## 2015-01-29 MED ORDER — MEPERIDINE HCL 100 MG/ML IJ SOLN
INTRAMUSCULAR | Status: AC
  Filled 2015-01-29: qty 2

## 2015-01-29 MED ORDER — MEPERIDINE HCL 100 MG/ML IJ SOLN
INTRAMUSCULAR | Status: DC | PRN
  Administered 2015-01-29: 25 mg via INTRAVENOUS
  Administered 2015-01-29: 50 mg via INTRAVENOUS

## 2015-01-29 MED ORDER — ONDANSETRON HCL 4 MG/2ML IJ SOLN
INTRAMUSCULAR | Status: AC
  Filled 2015-01-29: qty 2

## 2015-01-29 MED ORDER — ONDANSETRON HCL 4 MG/2ML IJ SOLN
INTRAMUSCULAR | Status: DC | PRN
Start: 2015-01-29 — End: 2015-01-29
  Administered 2015-01-29: 4 mg via INTRAVENOUS

## 2015-01-29 MED ORDER — MIDAZOLAM HCL 5 MG/5ML IJ SOLN
INTRAMUSCULAR | Status: DC | PRN
  Administered 2015-01-29: 1 mg via INTRAVENOUS
  Administered 2015-01-29 (×2): 2 mg via INTRAVENOUS

## 2015-01-29 MED ORDER — LIDOCAINE VISCOUS 2 % MT SOLN
OROMUCOSAL | Status: AC
  Filled 2015-01-29: qty 15

## 2015-01-29 NOTE — Op Note (Signed)
[facility] [address] [place], [address]   ENDOSCOPY PROCEDURE REPORT  PATIENT: Harry, Franklin  MR#: [id number] BIRTHDATE: November 07, 1940 , 73  yrs. old GENDER: male ENDOSCOPIST: R.  Garfield Cornea, MD FACP FACG REFERRED BY:  Bernerd Limbo, M.D. PROCEDURE DATE:  2015/02/23 PROCEDURE:  EGD, diagnostic and Maloney dilation of esophagus INDICATIONS:  history of severe reflux esophagitis with stricture; some persisting esophageal dysphagia. MEDICATIONS: Versed 5 mg IV and Demerol 75 mg IV in divided doses. Xylocaine gel orally.  Zofran 4 mg IV. ASA CLASS:      Class II  CONSENT: The risks, benefits, limitations, alternatives and imponderables have been discussed.  The potential for biopsy, esophogeal dilation, etc. have also been reviewed.  Questions have been answered.  All parties agreeable.  Please see the history and physical in the medical record for more information.  DESCRIPTION OF PROCEDURE: After the risks benefits and alternatives of the procedure were thoroughly explained, informed consent was obtained.  The EG-[id number] ([id number]) endoscope was introduced through the mouth and advanced to the second portion of the duodenum , limited by Without limitations. The instrument was slowly withdrawn as the mucosa was fully examined. Estimated blood loss is zero unless otherwise noted in this procedure report.    Patient again noted to have short segment noncritical peptic appearing stricture at the GE junction with geographic ulceration involving the distal 2 cm of the esophagus.  This inflammation appeared to be less than seen previously.  No tumor or Barrett's esophagus identified.  Stomach empty.  5 cm hiatal hernia present.  Normal-appearing gastric mucosa.  Patent pylorus.  Normal-appearing first and second portion of the duodenum.  Scope was withdrawn and a 54 [place] Maloney dilator was passed to full insertion easily.  A look back revealed the stricture  had been dilated without apparent complication and minimal bleeding. Retroflexed views revealed a hiatal hernia.     The scope was then withdrawn from the patient and the procedure completed.  COMPLICATIONS: There were no immediate complications. EBL 3 mL ENDOSCOPIC IMPRESSION: Reflux esophagitis with peptic stricture formation?"inflammation overall improved from that seen previously. Status post [facility] dilation. Hiatal hernia.  RECOMMENDATIONS: Continue omeprazole 40 mg daily indefinitely?"as discussed, the benefits of taking acid suppression therapy in this setting far outweigh the risks.    Office visit with [place] in one year.  REPEAT EXAM:  eSigned:  R. Garfield Cornea, MD [facility] 02/23/2015 9:38 AM    CC:  CPT CODES: ICD CODES:  The ICD and CPT codes recommended by this software are interpretations from the data that the clinical staff has captured with the software.  The verification of the translation of this report to the ICD and CPT codes and modifiers is the sole responsibility of the health care institution and practicing physician where this report was generated.  [employer] will not be held responsible for the validity of the ICD and CPT codes included on this report.  AMA assumes no liability for data contained or not contained herein. CPT is a Designer, television/film set of the [employer].  PATIENT NAME:  Harry, Franklin MR#: [id number]

## 2015-01-29 NOTE — Discharge Instructions (Signed)
EGD Discharge instructions Please read the instructions outlined below and refer to this sheet in the next few weeks. These discharge instructions provide you with general information on caring for yourself after you leave the hospital. Your doctor may also give you specific instructions. While your treatment has been planned according to the most current medical practices available, unavoidable complications occasionally occur. If you have any problems or questions after discharge, please call your doctor. ACTIVITY  You may resume your regular activity but move at a slower pace for the next 24 hours.   Take frequent rest periods for the next 24 hours.   Walking will help expel (get rid of) the air and reduce the bloated feeling in your abdomen.   No driving for 24 hours (because of the anesthesia (medicine) used during the test).   You may shower.   Do not sign any important legal documents or operate any machinery for 24 hours (because of the anesthesia used during the test).  NUTRITION  Drink plenty of fluids.   You may resume your normal diet.   Begin with a light meal and progress to your normal diet.   Avoid alcoholic beverages for 24 hours or as instructed by your caregiver.  MEDICATIONS  You may resume your normal medications unless your caregiver tells you otherwise.  WHAT YOU CAN EXPECT TODAY  You may experience abdominal discomfort such as a feeling of fullness or gas pains.  FOLLOW-UP  Your doctor will discuss the results of your test with you.  SEEK IMMEDIATE MEDICAL ATTENTION IF ANY OF THE FOLLOWING OCCUR:  Excessive nausea (feeling sick to your stomach) and/or vomiting.   Severe abdominal pain and distention (swelling).   Trouble swallowing.   Temperature over 101 F (37.8 C).   Rectal bleeding or vomiting of blood.    Information on hiatal hernia and GERD provided  Continue omeprazole 40 mg daily indefinitely-as discussed, the benefits outweigh the  risks  May use Zantac or ranitidine on top of omeprazole as needed  Office visit with [place] in 1 year.     Gastroesophageal Reflux Disease, Adult Gastroesophageal reflux disease (GERD) happens when acid from your stomach flows up into the esophagus. When acid comes in contact with the esophagus, the acid causes soreness (inflammation) in the esophagus. Over time, GERD may create small holes (ulcers) in the lining of the esophagus. CAUSES   Increased body weight. This puts pressure on the stomach, making acid rise from the stomach into the esophagus.  Smoking. This increases acid production in the stomach.  Drinking alcohol. This causes decreased pressure in the lower esophageal sphincter (valve or ring of muscle between the esophagus and stomach), allowing acid from the stomach into the esophagus.  Late evening meals and a full stomach. This increases pressure and acid production in the stomach.  A malformed lower esophageal sphincter. Sometimes, no cause is found. SYMPTOMS   Burning pain in the lower part of the mid-chest behind the breastbone and in the mid-stomach area. This may occur twice a week or more often.  Trouble swallowing.  Sore throat.  Dry cough.  Asthma-like symptoms including chest tightness, shortness of breath, or wheezing. DIAGNOSIS  Your caregiver may be able to diagnose GERD based on your symptoms. In some cases, X-rays and other tests may be done to check for complications or to check the condition of your stomach and esophagus. TREATMENT  Your caregiver may recommend over-the-counter or prescription medicines to help decrease acid production. Ask your caregiver  before starting or adding any new medicines.  HOME CARE INSTRUCTIONS   Change the factors that you can control. Ask your caregiver for guidance concerning weight loss, quitting smoking, and alcohol consumption.  Avoid foods and drinks that make your symptoms worse, such as:  Caffeine or  alcoholic drinks.  Chocolate.  Peppermint or mint flavorings.  Garlic and onions.  Spicy foods.  Citrus fruits, such as oranges, lemons, or limes.  Tomato-based foods such as sauce, chili, salsa, and pizza.  Fried and fatty foods.  Avoid lying down for the 3 hours prior to your bedtime or prior to taking a nap.  Eat small, frequent meals instead of large meals.  Wear loose-fitting clothing. Do not wear anything tight around your waist that causes pressure on your stomach.  Raise the head of your bed 6 to 8 inches with wood blocks to help you sleep. Extra pillows will not help.  Only take over-the-counter or prescription medicines for pain, discomfort, or fever as directed by your caregiver.  Do not take aspirin, ibuprofen, or other nonsteroidal anti-inflammatory drugs (NSAIDs). SEEK IMMEDIATE MEDICAL CARE IF:   You have pain in your arms, neck, jaw, teeth, or back.  Your pain increases or changes in intensity or duration.  You develop nausea, vomiting, or sweating (diaphoresis).  You develop shortness of breath, or you faint.  Your vomit is green, yellow, black, or looks like coffee grounds or blood.  Your stool is red, bloody, or black. These symptoms could be signs of other problems, such as heart disease, gastric bleeding, or esophageal bleeding. MAKE SURE YOU:   Understand these instructions.  Will watch your condition.  Will get help right away if you are not doing well or get worse. Document Released: 02/15/2005 Document Revised: 07/31/2011 Document Reviewed: 11/25/2010 [facility] Patient Information 2015 [place], [place]. This information is not intended to replace advice given to you by your health care provider. Make sure you discuss any questions you have with your health care provider.   Hiatal Hernia A hiatal hernia occurs when part of your stomach slides above the muscle that separates your abdomen from your chest (diaphragm). You can be born with a  hiatal hernia (congenital), or it may develop over time. In almost all cases of hiatal hernia, only the top part of the stomach pushes through.  Many people have a hiatal hernia with no symptoms. The larger the hernia, the more likely that you will have symptoms. In some cases, a hiatal hernia allows stomach acid to flow back into the tube that carries food from your mouth to your stomach (esophagus). This may cause heartburn symptoms. Severe heartburn symptoms may mean you have developed a condition called gastroesophageal reflux disease (GERD).  CAUSES  Hiatal hernias are caused by a weakness in the opening (hiatus) where your esophagus passes through your diaphragm to attach to the upper part of your stomach. You may be born with a weakness in your hiatus, or a weakness can develop. RISK FACTORS Older age is a major risk factor for a hiatal hernia. Anything that increases pressure on your diaphragm can also increase your risk of a hiatal hernia. This includes:  Pregnancy.  Excess weight.  Frequent constipation. SIGNS AND SYMPTOMS  People with a hiatal hernia often have no symptoms. If symptoms develop, they are almost always caused by GERD. They may include:  Heartburn.  Belching.  Indigestion.  Trouble swallowing.  Coughing or wheezing.  Sore throat.  Hoarseness.  Chest pain. DIAGNOSIS  A  hiatal hernia is sometimes found during an exam for another problem. Your health care provider may suspect a hiatal hernia if you have symptoms of GERD. Tests may be done to diagnose GERD. These may include:  X-rays of your stomach or chest.  An upper gastrointestinal (GI) series. This is an X-ray exam of your GI tract involving the use of a chalky liquid that you swallow. The liquid shows up clearly on the X-ray.  Endoscopy. This is a procedure to look into your stomach using a thin, flexible tube that has a tiny camera and light on the end of it. TREATMENT  If you have no symptoms, you  may not need treatment. If you have symptoms, treatment may include:  Dietary and lifestyle changes to help reduce GERD symptoms.  Medicines. These may include:  Over-the-counter antacids.  Medicines that make your stomach empty more quickly.  Medicines that block the production of stomach acid (H2 blockers).  Stronger medicines to reduce stomach acid (proton pump inhibitors).  You may need surgery to repair the hernia if other treatments are not helping. HOME CARE INSTRUCTIONS   Take all medicines as directed by your health care provider.  Quit smoking, if you smoke.  Try to achieve and maintain a healthy body weight.  Eat frequent small meals instead of three large meals a day. This keeps your stomach from getting too full.  Eat slowly.  Do not lie down right after eating.  Do noteat 1-2 hours before bed.   Do not drink beverages with caffeine. These include cola, coffee, cocoa, and tea.  Do not drink alcohol.  Avoid foods that can make symptoms of GERD worse. These may include:  Fatty foods.  Citrus fruits.  Other foods and drinks that contain acid.  Avoid putting pressure on your belly. Anything that puts pressure on your belly increases the amount of acid that may be pushed up into your esophagus.   Avoid bending over, especially after eating.  Raise the head of your bed by putting blocks under the legs. This keeps your head and esophagus higher than your stomach.  Do not wear tight clothing around your chest or stomach.  Try not to strain when having a bowel movement, when urinating, or when lifting heavy objects. SEEK MEDICAL CARE IF:  Your symptoms are not controlled with medicines or lifestyle changes.  You are having trouble swallowing.  You have coughing or wheezing that will not go away. SEEK IMMEDIATE MEDICAL CARE IF:  Your pain is getting worse.  Your pain spreads to your arms, neck, jaw, teeth, or back.  You have shortness of  breath.  You sweat for no reason.  You feel sick to your stomach (nauseous) or vomit.  You vomit blood.  You have bright red blood in your stools.  You have black, tarry stools.  Document Released: 07/29/2003 Document Revised: 09/22/2013 Document Reviewed: 04/25/2013 [facility] Patient Information 2015 [place], [place]. This information is not intended to replace advice given to you by your health care provider. Make sure you discuss any questions you have with your health care provider.

## 2015-01-29 NOTE — Interval H&P Note (Signed)
History and Physical Interval Note:  01/29/2015 9:05 AM  Harry Franklin  has presented today for surgery, with the diagnosis of EGD  The various methods of treatment have been discussed with the patient and family. After consideration of risks, benefits and other options for treatment, the patient has consented to  Procedure(s) with comments: ESOPHAGOGASTRODUODENOSCOPY (EGD) (N/A) - 0800-rescheduled to 9/9 @ 915 [address] notified pt as a surgical intervention .  The patient's history has been reviewed, patient examined, no change in status, stable for surgery.  I have reviewed the patient's chart and labs.  Questions were answered to the patient's satisfaction.     No change. Only a couple episodes of vague dysphagia since esophageal dilation. Tolerating omeprazole 40 mg daily very well. EGD with possible dilation as appropriate/feasible.The risks, benefits, limitations, alternatives and imponderables have been reviewed with the patient. Potential for esophageal dilation, biopsy, etc. have also been reviewed.  Questions have been answered. All parties agreeable.   Manus Rudd

## 2015-01-29 NOTE — Assessment & Plan Note (Signed)
Asian status post recent endoscopy on 10/02/2014 with [facility] dilation due to GERD and recurrent esophageal dysphagia. 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 Recommended stop omeprazole and start Dexilant. However the patient is intolerant to many PPIs. He stopped taking the Dexilant because he was having adverse effects and is currently on Zantac 150 milligrams twice a day. He did tolerate Prilosec however and felt his symptoms were better controlled on diet and then the Zantac.  Today we will have him continue his Zantac and restart his omeprazole 20 mg twice a day for better symptomatic control due to his intolerance of Dexilant. We'll have him repeat endoscopy for surveillance as previously recommended and return for follow-up in 3 months.  Proceed with EGD with Dr. Gala Romney in near future: the risks, benefits, and alternatives have been discussed with the patient in detail. The patient states understanding and desires to proceed.  The patient is not currently on any anticoagulants, chronic pain medications, anxiolytics, or antidepressants. Previous procedure completed under conscious sedation without noted conversation. Conscious sedation likely adequate for this procedure as well.

## 2015-01-29 NOTE — H&P (View-Only) (Signed)
Referring Provider: Bernerd Limbo, MD Primary Care Physician:  [name], MD Primary GI:  Dr. Gala Romney  Chief Complaint  Patient presents with  . Follow-up    HPI:   74 year old male presents for follow-up post procedure. On 10/02/2014 an EGD with biopsy and Maloney dilation was performed due to GERD and recurrent esophageal dysphagia. This was completed under conscious sedation without noted complication. Patient with noted extensive geographic ulceration in the distal esophagus, severe inflammation, short benign-appearing peptic stricture without obvious tumor or Barrett's esophagus. Antral erosions. Patent pylorus. 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 He is status post Maloney dilation with 7 [place] dilator. Recommendations included stopping omeprazole, but didn't Dexilant 60 mg daily. Recommend repeat follow-up in 3 months and reschedule follow-up endoscopy with biopsy. Colonoscopy completed on the same day found clonic diverticulosis, single diminutive rectal polyp. Pathology of the polyp was hyperplastic.  Today he states he was having diarrhea with PPI. Per telephone note, patient likely intolerant to PPI and recommended Zantac 150 mg bid. Currently takes 325 mg ASA daily. Denies NSAIDs and ASA powders. Will take Tylenol as needed for headaches. Denies hematochezia, melena, abdominal pain, N/V. Was having abdominal pain and diarrhea with PPI, which resolved with stopping PPI. He states with Zantac, he is having to take 4 TUMS at night. Typically lays down for bed about 5 hours after last meal of the day. On Prilosec, feels GERD was better controlled and didn't have diarrhea. Denies chest pain, dyspnea, dizziness, lightheadedness, syncope, near syncope. Denies any other upper or lower GI symptoms.  Past Medical History  Diagnosis Date  . GERD (gastroesophageal reflux disease)   . Hypokalemia   . Hypertension   . Cataract   . Arthritis      Past Surgical History  Procedure Laterality Date  . Replacement total knee    . Appendectomy    . Hammer toe surgery Left   . Lapidus procedure    . Hallux fusion    . Colonoscopy  03/31/05  . Colonoscopy N/A 10/02/2014    [id number]:HALPFXT diverticulosis  . Esophagogastroduodenoscopy N/A 10/02/2014    KWI:OXBDZ ulcerative reflux/s/p dilation/large HH  . Maloney dilation N/A 10/02/2014    Procedure: Venia Minks DILATION;  Surgeon: Daneil Dolin, MD;  Location: [facility];  Service: Endoscopy;  Laterality: N/A;    Current Outpatient Prescriptions  Medication Sig Dispense Refill  . aspirin 325 MG tablet Take 325 mg by mouth daily.    . calcium elemental as carbonate (BARIATRIC TUMS ULTRA) 400 MG tablet     . metoprolol (LOPRESSOR) 100 MG tablet     . Multiple Vitamins-Minerals (CENTRUM SILVER PO) Take 1 tablet by mouth daily.    . Probiotic Product (ALIGN PO) Take 1 tablet by mouth daily.    . ranitidine (ZANTAC) 150 MG capsule Take 150 mg by mouth 2 (two) times daily.    . sucralfate (CARAFATE) 1 GM/10ML suspension Take 1 g by mouth 4 (four) times daily -  with meals and at bedtime.    Marland Kitchen amoxicillin-clavulanate (AUGMENTIN) 875-125 MG per tablet     . cephALEXin (KEFLEX) 500 MG capsule Take 1 capsule (500 mg total) by mouth 3 (three) times daily. (Patient not taking: Reported on 01/26/2015) 30 capsule 2  . clindamycin (CLEOCIN) 300 MG capsule     . levofloxacin (LEVAQUIN) 500 MG tablet Take 1 tablet (500 mg total) by mouth daily. (Patient not taking: Reported on 01/26/2015) 10 tablet 0  . polyethylene  glycol-electrolytes (NULYTELY/GOLYTELY) 420 G solution Take 4,000 mLs by mouth once. (Patient not taking: Reported on 01/26/2015) 4000 mL 0   No current facility-administered medications for this visit.    Allergies as of 01/26/2015 - Review Complete 01/26/2015  Allergen Reaction Noted  . Rabeprazole  02/13/2014  . Vancomycin Hives and Rash 11/23/2012    Family History  Problem Relation  Age of Onset  . Colon cancer Maternal Uncle   . Heart disease Father     Social History   Social History  . Marital Status: Married    Spouse Name: N/A  . Number of Children: N/A  . Years of Education: N/A   Social History Main Topics  . Smoking status: Former Smoker    Quit date: 01/22/1984  . Smokeless tobacco: None     Comment: qUIT IN 1985  . Alcohol Use: No  . Drug Use: No  . Sexual Activity: Not Asked   Other Topics Concern  . None   Social History Narrative    Review of Systems: General: Negative for anorexia, weight loss, fever, chills, fatigue, weakness. CV: Negative for chest pain, angina, palpitations, peripheral edema.  Respiratory: Negative for dyspnea at rest, cough, sputum, wheezing.  GI: See history of present illness. Endo: Negative for unusual weight change.  Heme: Negative for bruising or bleeding. Allergy: Negative for rash or hives.   Physical Exam: BP 153/88 mmHg  Pulse 85  Temp(Src) 97.3 F (36.3 C) (Oral)  Ht 6\' 4"  (1.93 m)  Wt 279 lb 3.2 oz (126.644 kg)  BMI 34.00 kg/m2 General:   Alert and oriented. Pleasant and cooperative. Well-nourished and well-developed.  Head:  Normocephalic and atraumatic. Eyes:  Without icterus, sclera clear and conjunctiva pink.  Cardiovascular:  S1, S2 present without murmurs appreciated. Normal pulses noted. Extremities without clubbing or edema. Respiratory:  Clear to auscultation bilaterally. No wheezes, rales, or rhonchi. No distress.  Gastrointestinal:  +BS, soft, and non-distended. Mild epigastric TTP. No HSM noted. No guarding or rebound. No masses appreciated.  Rectal:  Deferred  Skin:  Intact without significant lesions or rashes. Neurologic:  Alert and oriented x4;  grossly normal neurologically. Psych:  Alert and cooperative. Normal mood and affect. Heme/Lymph/Immune: No excessive bruising noted.    01/26/2015 2:23 PM

## 2015-02-01 NOTE — Progress Notes (Signed)
CC'ED TO PCP 

## 2015-02-02 ENCOUNTER — Encounter (HOSPITAL_COMMUNITY): Payer: Self-pay | Admitting: Internal Medicine

## 2015-02-04 ENCOUNTER — Encounter: Payer: Self-pay | Admitting: Podiatry

## 2015-02-04 ENCOUNTER — Ambulatory Visit (INDEPENDENT_AMBULATORY_CARE_PROVIDER_SITE_OTHER): Payer: Medicare HMO | Admitting: Podiatry

## 2015-02-04 VITALS — BP 158/84 | HR 73 | Resp 18

## 2015-02-04 DIAGNOSIS — Z899 Acquired absence of limb, unspecified: Secondary | ICD-10-CM

## 2015-02-04 NOTE — Progress Notes (Signed)
Patient ID: Harry Franklin, male   DOB: 02/06/1941, 74 y.o.   MRN: [id number]  DOS: 01/13/15 s/p Left partial 3rd toe amputation  Subjective: 74 year old male presents the office today 3 weeks status post left partial third toe amputation. He states that overall he is doing well. He states his pain is significantly improved compared to prior to surgery has not required any pain medicine. Since last appointment he has transitioned back into a sneaker. He has been applying antibiotic ointment followed by a Band-Aid over the incision daily. He denies any redness or any drainage from the area. He is inquiring about going back to exercising and swelling. He denies any systemic complaints as fevers, chills, nausea, vomiting. No calf pain, chest pain, shortness of breath. No other complaints at this time in no acute changes otherwise.  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 The incision remains coapted without any evidence of dehiscence. There is a faint amount of erythema around the area which appears to be small red dots around the incision. I believe this is more likely due to the antibiotic ointment and a Band-Aid as opposed to infection. There is trace edema overlying the surgical site and there is no increase in warmth or ascending cellulitis. There is no drainage or purulence. No other lesions or pre-ulcerative lesions. Hammertoe contractures of the digits. There is no pain with calf compression, swelling, warmth, erythema.  Assessment: 74 year old male 3 weeks status post left partial toe amputation, doing well  Plan: -Treatment options discussed including all alternatives, risks, and complications -Recommended to hold off on antibiotic ointment that he's having a reaction to this. Continue to monitor closely. If any increasing erythema to call  the office immediately. -Continue with regular shoe gear. -Discussed with Dr. Vivi Barrack increase his activity as tolerated. Importantly he looks of the incision the foot daily. If there are any problems the incision or any other areas of skin breakdown or irritation to hold off. He can to swim once the incision is well-healed. However hold off for now. -Monitor for any clinical signs or symptoms of infection and directed to call the office immediately should any occur or go to the ER. -Follow-up 3 weeks or sooner if any problems arise. In the meantime, encouraged to call the office with any questions, concerns, change in symptoms.   Celesta Gentile, DPM

## 2015-02-25 ENCOUNTER — Encounter: Payer: Self-pay | Admitting: Podiatry

## 2015-02-25 ENCOUNTER — Ambulatory Visit (INDEPENDENT_AMBULATORY_CARE_PROVIDER_SITE_OTHER): Payer: Medicare HMO | Admitting: Podiatry

## 2015-02-25 VITALS — BP 162/92 | HR 66 | Resp 18

## 2015-02-25 DIAGNOSIS — Z9889 Other specified postprocedural states: Secondary | ICD-10-CM

## 2015-02-25 DIAGNOSIS — Z899 Acquired absence of limb, unspecified: Secondary | ICD-10-CM

## 2015-02-25 DIAGNOSIS — L84 Corns and callosities: Secondary | ICD-10-CM

## 2015-02-26 ENCOUNTER — Encounter: Payer: Self-pay | Admitting: Podiatry

## 2015-02-26 NOTE — Progress Notes (Signed)
Patient ID: Harry Franklin, male   DOB: 07-01-1940, 74 y.o.   MRN: [id number]  Subjective: 74 year old male presents the office with concerns of a right submetatarsal 1 hyperkeratotic lesion. He denies any redness or drainage from the site. He has also following up status post left partial third toe amputation performed on 01/13/2015. He states that the areas doing well and his increased activity. He has returned to swimming without any problems. He is wearing a regular shoe. He denies any redness around the incision or any swelling. He continues to do that he has no pain. No other complaints at this time. No acute changes. Denies any systemic complaints such as fevers, chills, nausea, vomiting. No calf pain, chest pain, soreness of breath.  Objective: AAO 3, NAD Neurovascular status unchanged Status post left partial third toe amputation. Incision is well coapted without any evidence of dehiscence. There is no overlying edema, erythema, increase in warmth. There is no clinical signs of infection. No tenderness to palpation. Right foot second metatarsal 1 hyperkeratotic lesion. Upon debridement no underlying ulceration, drainage or other signs of infection. There are no other open lesions or pre-ulcerative lesions. No pain with calf compression, swelling, warmth, erythema.  Assessment: 74 year old male status post left partial third toe amputation which is healed, right sub-metatarsal 1 hyperkeratotic lesion  Plan: -Treatment options discussed including all alternatives, risks, and complications -At this time continue with a regular shoe and the left foot. Conservative slowly increase in activity. If is any problem the incision to hold off and call the office. Monitor for signs or symptoms of infection to call the office immediately should any occur call the office. However, this time the incision appears to be healed and he is doing well. No signs of infection. -Right submetatarsal 1 hyperkeratotic  lesion sharply debrided without complication/bleeding. -Follow-up as needed. Call any questions, concerns, change in symptoms in the meantime.  Celesta Gentile, DPM

## 2015-04-02 ENCOUNTER — Other Ambulatory Visit (HOSPITAL_COMMUNITY): Payer: Self-pay | Admitting: Oncology

## 2015-04-02 ENCOUNTER — Ambulatory Visit (INDEPENDENT_AMBULATORY_CARE_PROVIDER_SITE_OTHER): Payer: Medicare HMO | Admitting: Urology

## 2015-04-02 ENCOUNTER — Other Ambulatory Visit: Payer: Self-pay | Admitting: Urology

## 2015-04-02 ENCOUNTER — Ambulatory Visit (HOSPITAL_COMMUNITY)
Admission: RE | Admit: 2015-04-02 | Discharge: 2015-04-02 | Disposition: A | Discharge status: Home / Self Care | Payer: Medicare HMO | Source: Ambulatory Visit | Attending: Urology | Admitting: Urology

## 2015-04-02 DIAGNOSIS — R911 Solitary pulmonary nodule: Secondary | ICD-10-CM | POA: Insufficient documentation

## 2015-04-02 DIAGNOSIS — C799 Secondary malignant neoplasm of unspecified site: Secondary | ICD-10-CM

## 2015-04-02 DIAGNOSIS — R937 Abnormal findings on diagnostic imaging of other parts of musculoskeletal system: Secondary | ICD-10-CM | POA: Diagnosis not present

## 2015-04-02 DIAGNOSIS — C7951 Secondary malignant neoplasm of bone: Secondary | ICD-10-CM

## 2015-04-02 DIAGNOSIS — N433 Hydrocele, unspecified: Secondary | ICD-10-CM

## 2015-04-02 DIAGNOSIS — R9349 Abnormal radiologic findings on diagnostic imaging of other urinary organs: Secondary | ICD-10-CM | POA: Diagnosis not present

## 2015-04-02 DIAGNOSIS — C7970 Secondary malignant neoplasm of unspecified adrenal gland: Secondary | ICD-10-CM

## 2015-04-02 DIAGNOSIS — R3121 Asymptomatic microscopic hematuria: Secondary | ICD-10-CM

## 2015-04-02 DIAGNOSIS — C772 Secondary and unspecified malignant neoplasm of intra-abdominal lymph nodes: Secondary | ICD-10-CM | POA: Diagnosis not present

## 2015-04-02 DIAGNOSIS — R590 Localized enlarged lymph nodes: Secondary | ICD-10-CM | POA: Diagnosis not present

## 2015-04-02 DIAGNOSIS — R160 Hepatomegaly, not elsewhere classified: Secondary | ICD-10-CM | POA: Insufficient documentation

## 2015-04-02 DIAGNOSIS — C787 Secondary malignant neoplasm of liver and intrahepatic bile duct: Secondary | ICD-10-CM

## 2015-04-02 DIAGNOSIS — R3915 Urgency of urination: Secondary | ICD-10-CM

## 2015-04-02 LAB — POCT I-STAT CREATININE: Creatinine, Ser: 1.4 mg/dL — ABNORMAL HIGH (ref 0.61–1.24)

## 2015-04-02 MED ORDER — IOHEXOL 300 MG/ML  SOLN
125.0000 mL | Freq: Once | INTRAMUSCULAR | Status: AC | PRN
  Administered 2015-04-02: 150 mL via INTRAVENOUS

## 2015-04-02 MED ORDER — SODIUM CHLORIDE 0.9 % IV SOLN
INTRAVENOUS | Status: AC
  Filled 2015-04-02: qty 250

## 2015-04-05 ENCOUNTER — Other Ambulatory Visit (HOSPITAL_COMMUNITY): Payer: Self-pay | Admitting: Oncology

## 2015-04-05 DIAGNOSIS — C799 Secondary malignant neoplasm of unspecified site: Secondary | ICD-10-CM

## 2015-04-05 MED ORDER — OXYCODONE HCL 5 MG PO TABS: 5.0000 mg | ORAL_TABLET | ORAL | Status: DC | PRN

## 2015-04-07 ENCOUNTER — Other Ambulatory Visit: Payer: Self-pay | Admitting: Radiology

## 2015-04-08 ENCOUNTER — Encounter (HOSPITAL_COMMUNITY): Payer: Self-pay

## 2015-04-08 ENCOUNTER — Ambulatory Visit (HOSPITAL_COMMUNITY)
Admission: RE | Admit: 2015-04-08 | Discharge: 2015-04-08 | Disposition: A | Discharge status: Home / Self Care | Payer: Medicare HMO | Source: Ambulatory Visit | Attending: Oncology | Admitting: Oncology

## 2015-04-08 DIAGNOSIS — R3129 Other microscopic hematuria: Secondary | ICD-10-CM | POA: Insufficient documentation

## 2015-04-08 DIAGNOSIS — K219 Gastro-esophageal reflux disease without esophagitis: Secondary | ICD-10-CM | POA: Diagnosis not present

## 2015-04-08 DIAGNOSIS — Z87891 Personal history of nicotine dependence: Secondary | ICD-10-CM | POA: Insufficient documentation

## 2015-04-08 DIAGNOSIS — C799 Secondary malignant neoplasm of unspecified site: Secondary | ICD-10-CM

## 2015-04-08 DIAGNOSIS — C801 Malignant (primary) neoplasm, unspecified: Secondary | ICD-10-CM | POA: Insufficient documentation

## 2015-04-08 DIAGNOSIS — M899 Disorder of bone, unspecified: Secondary | ICD-10-CM | POA: Insufficient documentation

## 2015-04-08 DIAGNOSIS — I10 Essential (primary) hypertension: Secondary | ICD-10-CM | POA: Insufficient documentation

## 2015-04-08 DIAGNOSIS — K769 Liver disease, unspecified: Secondary | ICD-10-CM | POA: Diagnosis present

## 2015-04-08 DIAGNOSIS — Z7982 Long term (current) use of aspirin: Secondary | ICD-10-CM | POA: Insufficient documentation

## 2015-04-08 DIAGNOSIS — R911 Solitary pulmonary nodule: Secondary | ICD-10-CM | POA: Diagnosis not present

## 2015-04-08 DIAGNOSIS — C787 Secondary malignant neoplasm of liver and intrahepatic bile duct: Secondary | ICD-10-CM | POA: Insufficient documentation

## 2015-04-08 DIAGNOSIS — Z79899 Other long term (current) drug therapy: Secondary | ICD-10-CM | POA: Insufficient documentation

## 2015-04-08 DIAGNOSIS — R590 Localized enlarged lymph nodes: Secondary | ICD-10-CM | POA: Diagnosis not present

## 2015-04-08 LAB — CBC
HEMATOCRIT: 39.8 % (ref 39.0–52.0)
Hemoglobin: 13.3 g/dL (ref 13.0–17.0)
MCH: 30.8 pg (ref 26.0–34.0)
MCHC: 33.4 g/dL (ref 30.0–36.0)
MCV: 92.1 fL (ref 78.0–100.0)
PLATELETS: 258 10*3/uL (ref 150–400)
RBC: 4.32 MIL/uL (ref 4.22–5.81)
RDW: 13.1 % (ref 11.5–15.5)
WBC: 8.2 10*3/uL (ref 4.0–10.5)

## 2015-04-08 LAB — APTT: aPTT: 33 seconds (ref 24–37)

## 2015-04-08 LAB — PROTIME-INR
INR: 1.19 (ref 0.00–1.49)
Prothrombin Time: 15.3 seconds — ABNORMAL HIGH (ref 11.6–15.2)

## 2015-04-08 MED ORDER — FENTANYL CITRATE (PF) 100 MCG/2ML IJ SOLN
INTRAMUSCULAR | Status: AC | PRN
  Administered 2015-04-08 (×2): 25 ug via INTRAVENOUS

## 2015-04-08 MED ORDER — MIDAZOLAM HCL 2 MG/2ML IJ SOLN
INTRAMUSCULAR | Status: AC
  Filled 2015-04-08: qty 2

## 2015-04-08 MED ORDER — SODIUM CHLORIDE 0.9 % IV SOLN
INTRAVENOUS | Status: DC
  Administered 2015-04-08: 12:00:00 via INTRAVENOUS

## 2015-04-08 MED ORDER — FENTANYL CITRATE (PF) 100 MCG/2ML IJ SOLN
INTRAMUSCULAR | Status: DC
Start: 2015-04-08 — End: 2015-04-09
  Filled 2015-04-08: qty 2

## 2015-04-08 MED ORDER — MIDAZOLAM HCL 2 MG/2ML IJ SOLN
INTRAMUSCULAR | Status: AC | PRN
  Administered 2015-04-08: 1 mg via INTRAVENOUS

## 2015-04-08 MED ORDER — LIDOCAINE HCL (PF) 1 % IJ SOLN
INTRAMUSCULAR | Status: AC
  Filled 2015-04-08: qty 10

## 2015-04-08 NOTE — Discharge Instructions (Signed)

## 2015-04-08 NOTE — Sedation Documentation (Signed)
Patient is resting comfortably. 

## 2015-04-08 NOTE — H&P (Signed)
Chief Complaint: Patient was seen in consultation today for liver lesion at the request of [place]  Referring Physician(s): Kefalas,Thomas S  History of Present Illness: Harry Franklin is a 74 y.o. male with microscopic hematuria who underwent a CT on 04/02/15 that revealed multiple liver lesions with extensive abdominopelvic lymphadenopathy, a pulmonary nodule, destructive lytic bone lesion and wall thickening in esophagus s/p maloney dilation 01/2015. IR received request for image guided liver lesion biopsy. He denies any chest pain, shortness of breath or palpitations. He denies any active signs of bleeding or excessive bruising. He denies any recent fever or chills. The patient denies any history of sleep apnea or chronic oxygen use. He has previously tolerated sedation without complications. He does state he experiences intermittent "sharp" pains in his RUQ, but denies any active pain. He does admit to some nausea this morning, but denies any active nausea.    Past Medical History  Diagnosis Date  . GERD (gastroesophageal reflux disease)   . Hypokalemia   . Hypertension   . Cataract   . Arthritis     Past Surgical History  Procedure Laterality Date  . Replacement total knee    . Appendectomy    . Hammer toe surgery Left   . Lapidus procedure    . Hallux fusion    . Colonoscopy  03/31/05  . Colonoscopy N/A 10/02/2014    [id number] diverticulosis  . Esophagogastroduodenoscopy N/A 10/02/2014    [id number] ulcerative reflux/s/p dilation/large HH  . Maloney dilation N/A 10/02/2014    Procedure: Venia Minks DILATION;  Surgeon: Daneil Dolin, MD;  Location: [facility];  Service: Endoscopy;  Laterality: N/A;  . Esophagogastroduodenoscopy N/A 01/29/2015    Procedure: ESOPHAGOGASTRODUODENOSCOPY (EGD);  Surgeon: Daneil Dolin, MD;  Location: [facility];  Service: Endoscopy;  Laterality: N/A;  0800-rescheduled to 9/9 @ 915 [place] notified pt  . Esophageal dilation  01/29/2015     Procedure: ESOPHAGEAL DILATION;  Surgeon: Daneil Dolin, MD;  Location: [facility];  Service: Endoscopy;;    Allergies: Rabeprazole and Vancomycin  Medications: Prior to Admission medications   Medication Sig Start Date End Date Taking? Authorizing Provider  aspirin 325 MG tablet Take 325 mg by mouth daily.   Yes Historical Provider, MD  calcium carbonate (TUMS EX) 750 MG chewable tablet Chew 2 tablets by mouth 2 (two) times daily as needed for heartburn.   Yes Historical Provider, MD  metoprolol (LOPRESSOR) 100 MG tablet Take 100 mg by mouth at bedtime.  02/25/13  Yes Historical Provider, MD  Multiple Vitamins-Minerals (PRESERVISION AREDS 2 PO) Take 1 tablet by mouth 2 (two) times daily.   Yes Historical Provider, MD  omeprazole (PRILOSEC) 40 MG capsule Take 40 mg by mouth daily.   Yes Historical Provider, MD  oxyCODONE (OXY IR/ROXICODONE) 5 MG immediate release tablet Take 1-2 tablets (5-10 mg total) by mouth every 4 (four) hours as needed for severe pain. 04/05/15  Yes Baird Cancer, PA-C  Probiotic Product (ALIGN PO) Take 1 tablet by mouth daily.   Yes Historical Provider, MD  ranitidine (ZANTAC) 150 MG capsule Take 150 mg by mouth 2 (two) times daily.   Yes Historical Provider, MD     Family History  Problem Relation Age of Onset  . Colon cancer Maternal Uncle   . Heart disease Father     Social History   Social History  . Marital Status: Married    Spouse Name: N/A  . Number of Children:  N/A  . Years of Education: N/A   Social History Main Topics  . Smoking status: Former Smoker    Quit date: 01/22/1984  . Smokeless tobacco: None     Comment: qUIT IN 1985  . Alcohol Use: No  . Drug Use: No  . Sexual Activity: Not Asked   Other Topics Concern  . None   Social History Narrative    Review of Systems: A 12 point ROS discussed and pertinent positives are indicated in the HPI above.  All other systems are negative.  Review of Systems  Vital Signs: BP  159/81 mmHg  Pulse 71  Temp(Src) 98 F (36.7 C) (Oral)  Resp 18  Ht 6\' 4"  (1.93 m)  Wt 260 lb (117.935 kg)  BMI 31.66 kg/m2  SpO2 100%  Physical Exam  Constitutional: He is oriented to person, place, and time. No distress.  HENT:  Head: Normocephalic and atraumatic.  Cardiovascular: Normal rate and regular rhythm.  Exam reveals no gallop and no friction rub.   No murmur heard. Pulmonary/Chest: Effort normal and breath sounds normal. No respiratory distress. He has no wheezes. He has no rales.  Abdominal: Soft. Bowel sounds are normal. He exhibits no distension. There is no tenderness.  Neurological: He is alert and oriented to person, place, and time.  Skin: Skin is warm and dry. He is not diaphoretic.    Mallampati Score:  MD Evaluation Airway: WNL Heart: WNL Abdomen: WNL Chest/ Lungs: WNL ASA  Classification: 3 Mallampati/Airway Score: Two  Imaging: Ct Abdomen Pelvis W Wo Contrast  04/02/2015  CLINICAL DATA:  Asymptomatic microscopic hematuria.  Appendectomy. EXAM: CT ABDOMEN AND PELVIS WITHOUT AND WITH CONTRAST TECHNIQUE: Multidetector CT imaging of the abdomen and pelvis was performed following the standard protocol before and following the bolus administration of intravenous contrast. CONTRAST:  121mL OMNIPAQUE IOHEXOL 300 MG/ML  SOLN COMPARISON:  None. FINDINGS: Lower chest: Basilar left lower lobe 6 mm solid pulmonary nodule (series 11/image 13). Left anterior descending, left circumflex and right coronary atherosclerosis. Patulous lower thoracic esophagus containing fluid and demonstrating circumferential mild esophageal wall thickening. Mildly enlarged 1.0 cm right pericardial phrenic node (series 2/ image 11). Hepatobiliary: There is a hypoenhancing 3.2 x 2.4 cm segment 4B left liver lobe mass (series 3/ image 31). There is a hypoenhancing 2.4 x 1.8 cm segment 8 right liver lobe mass (3/13). There are innumerable additional smaller hypodense lesions scattered throughout  the liver (greater than 20), most of which are subcentimeter in size. Normal gallbladder with no radiopaque cholelithiasis. No biliary ductal dilatation. Pancreas: Fatty infiltration of the otherwise normal appearing pancreas. No main pancreatic duct dilation. No pancreatic mass. Spleen: Normal size. No mass. Adrenals/Urinary Tract: There are bilateral solid adrenal masses measuring 4.2 cm on the right and 5.2 cm on the left. No hydronephrosis. No nephrolithiasis. No renal cortical masses. Normal caliber ureters, with no ureteral stones. On delayed imaging, there is no urothelial wall thickening and there are no filling defects in the opacified portions of the bilateral collecting systems or ureters, noting limited evaluation of the non-opacified midportion of the right ureter. Mild diffuse bladder wall thickening. Small 1.2 x 1.1 cm right posterior bladder wall diverticulum. No focal bladder lesion. No bladder stones. Stomach/Bowel: Grossly normal stomach. Normal caliber small bowel with no small bowel wall thickening. The appendix is not discretely visualized. Mild sigmoid diverticulosis. No large bowel wall thickening or pericolonic fat stranding. Normal large bowel with no diverticulosis, large bowel wall thickening or pericolonic fat stranding.  Vascular/Lymphatic: Atherosclerotic nonaneurysmal abdominal aorta. Patent portal, splenic, hepatic and renal veins. There is extensive retroperitoneal adenopathy in the gastrohepatic ligament, porta hepatis, portacaval, aortocaval and left para-aortic chains. For example, there is a bulky 3.6 cm portacaval node (series 3/ image 31). There is a moderately enlarged 1.7 cm posterior paracaval node (3/30). There is a moderately enlarged 2.0 cm left para-aortic node (3/39). There is mild-to-moderate bilateral common and external iliac and mild bilateral inguinal lymphadenopathy. For example, there is a mildly-to-moderately enlarged 1.6 cm right external iliac node (3/79 and  there is a mildly enlarged 1.1 cm left common iliac node (3/64). Reproductive: Top-normal size prostate. Other: No pneumoperitoneum. Trace pelvic ascites and diffuse mesenteric edema. No focal fluid collection. Musculoskeletal: Destructive lytic lesion in the right L2 vertebral body. Marked degenerative changes in the visualized thoracolumbar spine. Small fat containing left inguinal hernia. IMPRESSION: 1. 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 2. 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 3. Extensive abdominopelvic lymphadenopathy, likely nodal metastases. 4. Left lower lobe 6 mm solid pulmonary nodule, suspicious for a pulmonary metastasis. 5. 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 6. Circumferential wall thickening in the lower thoracic esophagus with associated paraesophageal lymphadenopathy, nonspecific, but suspicious for lower thoracic esophageal neoplasm. Recommend correlation with upper endoscopy. 7. No urolithiasis. No hydronephrosis. No renal cortical masses. No urothelial lesions, with limitations as described. 8. Mild diffuse bladder wall thickening with small bladder diverticulum, suggesting chronic bladder outlet obstruction. Recommend correlation with urinalysis. 9. Three-vessel coronary atherosclerosis. These results will be called to the ordering clinician or representative by the Radiologist Assistant, and communication documented in the PACS or zVision Dashboard. Electronically Signed   By: Ilona Sorrel M.D.   On: 04/02/2015 13:26    Labs:  CBC:  Recent Labs  04/08/15 1137  WBC 8.2  HGB 13.3  HCT 39.8  PLT 258    COAGS:  Recent Labs  04/08/15 1137  INR 1.19    BMP:  Recent Labs  04/02/15 1208  CREATININE 1.40*    Assessment and Plan: Microscopic hematuria Liver lesions with extensive abdominopelvic lymphadenopathy  Pulmonary nodule Destructive lytic bone  lesion Wall thickening in esophagus s/p maloney dilation 01/2015  Request for image guided liver lesion biopsy with sedation The patient has been NPO, no blood thinners taken, labs and vitals have been reviewed. Risks and Benefits discussed with the patient including, but not limited to bleeding/life threatening bleeding, infection, damage to adjacent structures or low yield requiring additional tests. All of the patient's questions were answered, patient is agreeable to proceed. Consent signed and in chart.    Thank you for this interesting consult.  I greatly enjoyed meeting Harry Franklin and look forward to participating in their care.  A copy of this report was sent to the requesting provider on this date.  SignedHedy Jacob 04/08/2015, 12:04 PM   I spent a total of 15 Minutes in face to face in clinical consultation, greater than 50% of which was counseling/coordinating care for liver lesion.

## 2015-04-08 NOTE — Procedures (Signed)
Successful RT LIVER MASS 18 G CORE BXS  NO COMP STABLE FULL REPORT IN PACS

## 2015-04-09 ENCOUNTER — Other Ambulatory Visit (HOSPITAL_COMMUNITY): Payer: Self-pay | Admitting: *Deleted

## 2015-04-09 DIAGNOSIS — R109 Unspecified abdominal pain: Secondary | ICD-10-CM | POA: Insufficient documentation

## 2015-04-09 DIAGNOSIS — R1011 Right upper quadrant pain: Secondary | ICD-10-CM

## 2015-04-09 MED ORDER — DEXAMETHASONE 4 MG PO TABS: ORAL_TABLET | ORAL | Status: DC

## 2015-04-12 ENCOUNTER — Encounter (HOSPITAL_COMMUNITY): Payer: Self-pay | Admitting: Oncology

## 2015-04-12 ENCOUNTER — Encounter (HOSPITAL_COMMUNITY): Payer: Medicare HMO | Attending: Oncology | Admitting: Oncology

## 2015-04-12 ENCOUNTER — Telehealth (HOSPITAL_COMMUNITY): Payer: Self-pay | Admitting: Hematology & Oncology

## 2015-04-12 VITALS — BP 138/81 | HR 79 | Temp 97.8°F | Resp 22 | Ht 75.5 in | Wt 268.4 lb

## 2015-04-12 DIAGNOSIS — C801 Malignant (primary) neoplasm, unspecified: Secondary | ICD-10-CM

## 2015-04-12 DIAGNOSIS — C787 Secondary malignant neoplasm of liver and intrahepatic bile duct: Secondary | ICD-10-CM | POA: Diagnosis not present

## 2015-04-12 DIAGNOSIS — C7972 Secondary malignant neoplasm of left adrenal gland: Secondary | ICD-10-CM

## 2015-04-12 DIAGNOSIS — C679 Malignant neoplasm of bladder, unspecified: Secondary | ICD-10-CM

## 2015-04-12 DIAGNOSIS — C7971 Secondary malignant neoplasm of right adrenal gland: Secondary | ICD-10-CM | POA: Diagnosis not present

## 2015-04-12 DIAGNOSIS — M899 Disorder of bone, unspecified: Secondary | ICD-10-CM

## 2015-04-12 DIAGNOSIS — C7910 Secondary malignant neoplasm of unspecified urinary organs: Secondary | ICD-10-CM

## 2015-04-12 HISTORY — DX: Secondary malignant neoplasm of unspecified urinary organs: C79.10

## 2015-04-12 MED ORDER — LIDOCAINE-PRILOCAINE 2.5-2.5 % EX CREA: TOPICAL_CREAM | CUTANEOUS | Status: DC

## 2015-04-12 MED ORDER — ONDANSETRON HCL 8 MG PO TABS: 8.0000 mg | ORAL_TABLET | Freq: Three times a day (TID) | ORAL | Status: DC | PRN

## 2015-04-12 MED ORDER — PROCHLORPERAZINE MALEATE 10 MG PO TABS: 10.0000 mg | ORAL_TABLET | Freq: Four times a day (QID) | ORAL | Status: DC | PRN

## 2015-04-12 MED ORDER — CALCIUM CARBONATE-VITAMIN D 500-200 MG-UNIT PO TABS: 2.0000 | ORAL_TABLET | Freq: Every day | ORAL | Status: DC

## 2015-04-12 NOTE — Assessment & Plan Note (Addendum)
Metastatic urothelial carcinoma with urine cytology and us-guided biopsy of hepatic lesion confirming the diagnosis.  Oncology history is developed.  We reviewed the patient's diagnosis, stage, prognosis, and treatment options.  He is educated that he has Stage IV disease, but he is a robust man and absolutely a candidate for systemic treatment.  We discussed Carboplatin/Gemcitabine in a day 1, 8 every 21 day fashion.  The goal of treatment is palliative to help with symptom control and an attempt at progression-free and overall survival.    Bone or liver involvement and poor performance status is most predictive of a poor response and survival.  Those meeting these parameters have a median survival of 4 months.  HOWEVER, this patient's performance status is an ECOG of 0-1.  Therefore, survival in this situation is much longer than 4 months.  As mentioned above, treatment with a response will help extend the patient's survival.  The patient was very clear that quality of life is more important to him than quantity; "but I do want to live as long as possible."  He raised some very real and reasonable concerns.  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  With treatment, he should be able to continue his care he provides his wife as treatment is typically well tolerated.  He also has some money in a 401K.  He does not want to drain this account for his treatment only to still pass as a result of his malignancy.  We understand this concern as it is all-too-often a real concern for patients, unfortunately.    He does have Medicare Advantage.  We have asked Lendell Caprice to evaluate his benefits and provide guidance to the patient regarding his out-of-pocket costs.   If he pursues systemic chemotherapy (he is a good candidate for treatment), he will need a port-a-cath and chemotherapy teaching for Carboplatin/Gemcitabine.  The use of cisplatin-based therapy in this man is  questionable and the benefit in the palliative setting does not outweigh the risks.  Based upon current schedule, he will get a port placed by IR tomorrow, followed by initiaiton of chemotherapy on Wednesday, 04/14/2015 with chemotherapy teaching scheduled on 04/14/2015 as well.  Pre-chemo labs: CBC diff, CMET, Mg.  Due to his lytic osseous lesion, he is a candidate for Xgeva every 28 days.  He will be started on Ca++ and Vit D.  We will address initiation of Xgeva on Day 8 Cycle 1 or Day 1 of Cycle 2.  Let's get chemotherapy started and evaluate tolerance prior to adding another agent into his cancer care.  He is asymptomatic regarding his L4 lytic lesion.  Return as scheduled.     Metastatic urothelial carcinoma ([place])   04/02/2015 Imaging CT abd/pelvis- Innumberable hepatic masses, B/L adrenal mets, abdominopelvic lymphadenopathy, LLL lung lesion 6 mm, Lytic lesion of right L2 vertebral bosy, circumferential wall thickening of lower thoracic esophagus, mild diffuse bladder wall thickening.   04/02/2015 Pathology Results Urine Cytology- cells present suspicious for malignancy.  Suspicious for high-grade urothelial carcinoma.   04/08/2015 Procedure US guided biopsy of hepatic lesion   04/08/2015 Pathology Results Liver, needle/core biopsy, right - METASTATIC HIGH GRADE CARCINOMA,

## 2015-04-12 NOTE — Patient Instructions (Addendum)
Harry Franklin   CHEMOTHERAPY INSTRUCTIONS  Premeds: Zofran - for nausea/vomiting prevention/reduction. Dexamethasone - steroid - given to reduce the risk of you having an allergic type reaction to the chemotherapy. Dex can cause you to feel energized, nervous/anxious/jittery, make you have trouble sleeping, and/or make you feel hot/flushed in the face/neck and/or look pink/red in the face/neck. These side effects will pass as the Dex wears off. (takes 30 minutes to infuse)   Carboplatin - this medication can be hard on your kidneys - this is why we need you to drink 64 oz of fluid (preferably water/decaff fluids) 2 days prior to chemo and for up to 4-5 days after chemo. Drink more if you can. This will help to keep your kidneys flushed. This can cause mild hair loss, lower your platelets (which keep you from bleeding out when you cut yourself), lower your white blood cells (fight infection), and cause nausea/vomiting. (only takes 30 minutes to infuse) To be given on Day 1 every 21 days.  Gemcitabine - bone marrow suppression (lowers white blood cells (fight infection), lowers red blood cells (make up your blood), lowers platelets (help blood to clot). Nausea/vomiting,fever, flu-like symptoms, rash. (takes 30 minutes to infuse). To be given on Days 1 & 8 every 21 days.   POTENTIAL SIDE EFFECTS OF TREATMENT: Increased Susceptibility to Infection, Vomiting, Constipation, Hair Thinning, Changes in Character of Skin and Nails (brittleness, dryness,etc.), Bone Marrow Suppression, Nausea, Diarrhea and Mouth Sores   EDUCATIONAL MATERIALS GIVEN AND REVIEWED: Chemotherapy and You booklet Specific Instructions Sheets: Carboplatin, Gemzar, Zofran, Dexamethasone, Compazine, Zofran, EMLA, Calcium + Vitamin D   SELF CARE ACTIVITIES WHILE ON CHEMOTHERAPY: Increase your fluid intake 48 hours prior to treatment and drink at least 2 quarts per day after treatment., No alcohol  intake., No aspirin or other medications unless approved by your oncologist., Eat foods that are light and easy to digest., Eat foods at cold or room temperature., No fried, fatty, or spicy foods immediately before or after treatment., Have teeth cleaned professionally before starting treatment. Keep dentures and partial plates clean., Use soft toothbrush and do not use mouthwashes that contain alcohol. Biotene is a good mouthwash that is available at most pharmacies or may be ordered by calling [phone]., Use warm salt water gargles (1 teaspoon salt per 1 quart warm water) before and after meals and at bedtime. Or you may rinse with 2 tablespoons of three -percent hydrogen peroxide mixed in eight ounces of water., Always use sunscreen with SPF (Sun Protection Factor) of 30 or higher., Use your nausea medication as directed to prevent nausea., Use your stool softener or laxative as directed to prevent constipation. and Use your anti-diarrheal medication as directed to stop diarrhea.  Please wash your hands for at least 30 seconds using warm soapy water. Handwashing is the #1 way to prevent the spread of germs. Stay away from sick people or people who are getting over a cold. If you develop respiratory systems such as green/yellow mucus production or productive cough or persistent cough let us know and we will see if you need an antibiotic. It is a good idea to keep a pair of gloves on when going into grocery stores/[employer] to decrease your risk of coming into contact with germs on the carts, etc. Carry alcohol hand gel with you at all times and use it frequently if out in public. All foods need to be cooked thoroughly. No raw foods. No medium or undercooked meats,  eggs. If your food is cooked medium well, it does not need to be hot pink or saturated with bloody liquid at all. Vegetables and fruits need to be washed/rinsed under the faucet with a dish detergent before being consumed. You can eat raw fruits  and vegetables unless we tell you otherwise but it would be best if you cooked them or bought frozen. Do not eat off of salad bars or hot bars unless you really trust the cleanliness of the restaurant. If you need dental work, please let Dr. Whitney Muse know before you go for your appointment so that we can coordinate the best possible time for you in regards to your chemo regimen. You need to also let your dentist know that you are actively taking chemo. We may need to do labs prior to your dental appointment. We also want your bowels moving at least every other day. If this is not happening, we need to know so that we can get you on a bowel regimen to help you go. If you are going to have sex, you must wear a condom to protect your partner from potential chemotherapy exposure. This should occur for up to 28 days post completion of chemo.    MEDICATIONS: You have been given prescriptions for the following medications:  Zofran 8mg  tablet. Take 1 tablet every 8 hours as needed for nausea/vomiting. (#1 nausea med to take, this can constipate)  Compazine 10mg  tablet. Take 1 tablet every 6 hours as needed for nausea/vomiting. (#2 nausea med to take, this can make you sleepy)  EMLA cream. Apply a quarter size amount to port site 1 hour prior to chemo. Do not rub in. Cover with plastic wrap.   Over-the-Counter Meds:  Miralax 1 capful in 8 oz of fluid daily. May increase to two times a day if needed. This is a stool softener. If this doesn't work proceed you can add:  Senokot S  - start with 1 tablet two times a day and increase to 4 tablets two times a day if needed. (total of 8 tablets in a 24 hour period). This is a stimulant laxative.   Call us if this does not help your bowels move.   Imodium 2mg  capsule. Take 2 capsules after the 1st loose stool and then 1 capsule every 2 hours until you go a total of 12 hours without having a loose stool. Call the [facility] if loose stools continue. If diarrhea  occurs @ bedtime, take 2 capsules @ bedtime. Then take 2 capsules every 4 hours until morning. Call [facility].      SYMPTOMS TO REPORT AS SOON AS POSSIBLE AFTER TREATMENT:  FEVER GREATER THAN 100.5 F  CHILLS WITH OR WITHOUT FEVER  NAUSEA AND VOMITING THAT IS NOT CONTROLLED WITH YOUR NAUSEA MEDICATION  UNUSUAL SHORTNESS OF BREATH  UNUSUAL BRUISING OR BLEEDING  TENDERNESS IN MOUTH AND THROAT WITH OR WITHOUT PRESENCE OF ULCERS  URINARY PROBLEMS  BOWEL PROBLEMS  UNUSUAL RASH    Wear comfortable clothing and clothing appropriate for easy access to any Portacath or PICC line. Let us know if there is anything that we can do to make your therapy better!      I have been informed and understand all of the instructions given to me and have received a copy. I have been instructed to call the clinic [phone] or my family physician as soon as possible for continued medical care, if indicated. I do not have any more questions at this  time but understand that I may call the [facility] or the Patient Navigator at [phone] during office hours should I have questions or need assistance in obtaining follow-up care.            Carboplatin injection What is this medicine? CARBOPLATIN (KAR boe pla tin) is a chemotherapy drug. It targets fast dividing cells, like cancer cells, and causes these cells to die. This medicine is used to treat ovarian cancer and many other cancers. This medicine may be used for other purposes; ask your health care provider or pharmacist if you have questions. What should I tell my health care provider before I take this medicine? They need to know if you have any of these conditions: -blood disorders -hearing problems -kidney disease -recent or ongoing radiation therapy -an unusual or allergic reaction to carboplatin, cisplatin, other chemotherapy, other medicines, foods, dyes, or preservatives -pregnant or trying to get  pregnant -breast-feeding How should I use this medicine? This drug is usually given as an infusion into a vein. It is administered in a hospital or clinic by a specially trained health care professional. Talk to your pediatrician regarding the use of this medicine in children. Special care may be needed. Overdosage: If you think you have taken too much of this medicine contact a poison control center or emergency room at once. NOTE: This medicine is only for you. Do not share this medicine with others. What if I miss a dose? It is important not to miss a dose. Call your doctor or health care professional if you are unable to keep an appointment. What may interact with this medicine? -medicines for seizures -medicines to increase blood counts like filgrastim, pegfilgrastim, sargramostim -some antibiotics like amikacin, gentamicin, neomycin, streptomycin, tobramycin -vaccines Talk to your doctor or health care professional before taking any of these medicines: -acetaminophen -aspirin -ibuprofen -ketoprofen -naproxen This list may not describe all possible interactions. Give your health care provider a list of all the medicines, herbs, non-prescription drugs, or dietary supplements you use. Also tell them if you smoke, drink alcohol, or use illegal drugs. Some items may interact with your medicine. What should I watch for while using this medicine? Your condition will be monitored carefully while you are receiving this medicine. You will need important blood work done while you are taking this medicine. This drug may make you feel generally unwell. This is not uncommon, as chemotherapy can affect healthy cells as well as cancer cells. Report any side effects. Continue your course of treatment even though you feel ill unless your doctor tells you to stop. In some cases, you may be given additional medicines to help with side effects. Follow all directions for their use. Call your doctor or  health care professional for advice if you get a fever, chills or sore throat, or other symptoms of a cold or flu. Do not treat yourself. This drug decreases your body's ability to fight infections. Try to avoid being around people who are sick. This medicine may increase your risk to bruise or bleed. Call your doctor or health care professional if you notice any unusual bleeding. Be careful brushing and flossing your teeth or using a toothpick because you may get an infection or bleed more easily. If you have any dental work done, tell your dentist you are receiving this medicine. Avoid taking products that contain aspirin, acetaminophen, ibuprofen, naproxen, or ketoprofen unless instructed by your doctor. These medicines may hide a fever. Do not become pregnant while  taking this medicine. Women should inform their doctor if they wish to become pregnant or think they might be pregnant. There is a potential for serious side effects to an unborn child. Talk to your health care professional or pharmacist for more information. Do not breast-feed an infant while taking this medicine. What side effects may I notice from receiving this medicine? Side effects that you should report to your doctor or health care professional as soon as possible: -allergic reactions like skin rash, itching or hives, swelling of the face, lips, or tongue -signs of infection - fever or chills, cough, sore throat, pain or difficulty passing urine -signs of decreased platelets or bleeding - bruising, pinpoint red spots on the skin, black, tarry stools, nosebleeds -signs of decreased red blood cells - unusually weak or tired, fainting spells, lightheadedness -breathing problems -changes in hearing -changes in vision -chest pain -high blood pressure -low blood counts - This drug may decrease the number of white blood cells, red blood cells and platelets. You may be at increased risk for infections and bleeding. -nausea and  vomiting -pain, swelling, redness or irritation at the injection site -pain, tingling, numbness in the hands or feet -problems with balance, talking, walking -trouble passing urine or change in the amount of urine Side effects that usually do not require medical attention (report to your doctor or health care professional if they continue or are bothersome): -hair loss -loss of appetite -metallic taste in the mouth or changes in taste This list may not describe all possible side effects. Call your doctor for medical advice about side effects. You may report side effects to FDA at 1-800-FDA-1088. Where should I keep my medicine? This drug is given in a hospital or clinic and will not be stored at home. NOTE: This sheet is a summary. It may not cover all possible information. If you have questions about this medicine, talk to your doctor, pharmacist, or health care provider.    2016, Elsevier/Gold Standard. (2007-08-13 14:38:05) Gemcitabine injection What is this medicine? GEMCITABINE (jem SIT a been) is a chemotherapy drug. This medicine is used to treat many types of cancer like breast cancer, lung cancer, pancreatic cancer, and ovarian cancer. This medicine may be used for other purposes; ask your health care provider or pharmacist if you have questions. What should I tell my health care provider before I take this medicine? They need to know if you have any of these conditions: -blood disorders -infection -kidney disease -liver disease -recent or ongoing radiation therapy -an unusual or allergic reaction to gemcitabine, other chemotherapy, other medicines, foods, dyes, or preservatives -pregnant or trying to get pregnant -breast-feeding How should I use this medicine? This drug is given as an infusion into a vein. It is administered in a hospital or clinic by a specially trained health care professional. Talk to your pediatrician regarding the use of this medicine in children.  Special care may be needed. Overdosage: If you think you have taken too much of this medicine contact a poison control center or emergency room at once. NOTE: This medicine is only for you. Do not share this medicine with others. What if I miss a dose? It is important not to miss your dose. Call your doctor or health care professional if you are unable to keep an appointment. What may interact with this medicine? -medicines to increase blood counts like filgrastim, pegfilgrastim, sargramostim -some other chemotherapy drugs like cisplatin -vaccines Talk to your doctor or health care professional before taking  any of these medicines: -acetaminophen -aspirin -ibuprofen -ketoprofen -naproxen This list may not describe all possible interactions. Give your health care provider a list of all the medicines, herbs, non-prescription drugs, or dietary supplements you use. Also tell them if you smoke, drink alcohol, or use illegal drugs. Some items may interact with your medicine. What should I watch for while using this medicine? Visit your doctor for checks on your progress. This drug may make you feel generally unwell. This is not uncommon, as chemotherapy can affect healthy cells as well as cancer cells. Report any side effects. Continue your course of treatment even though you feel ill unless your doctor tells you to stop. In some cases, you may be given additional medicines to help with side effects. Follow all directions for their use. Call your doctor or health care professional for advice if you get a fever, chills or sore throat, or other symptoms of a cold or flu. Do not treat yourself. This drug decreases your body's ability to fight infections. Try to avoid being around people who are sick. This medicine may increase your risk to bruise or bleed. Call your doctor or health care professional if you notice any unusual bleeding. Be careful brushing and flossing your teeth or using a toothpick  because you may get an infection or bleed more easily. If you have any dental work done, tell your dentist you are receiving this medicine. Avoid taking products that contain aspirin, acetaminophen, ibuprofen, naproxen, or ketoprofen unless instructed by your doctor. These medicines may hide a fever. Women should inform their doctor if they wish to become pregnant or think they might be pregnant. There is a potential for serious side effects to an unborn child. Talk to your health care professional or pharmacist for more information. Do not breast-feed an infant while taking this medicine. What side effects may I notice from receiving this medicine? Side effects that you should report to your doctor or health care professional as soon as possible: -allergic reactions like skin rash, itching or hives, swelling of the face, lips, or tongue -low blood counts - this medicine may decrease the number of white blood cells, red blood cells and platelets. You may be at increased risk for infections and bleeding. -signs of infection - fever or chills, cough, sore throat, pain or difficulty passing urine -signs of decreased platelets or bleeding - bruising, pinpoint red spots on the skin, black, tarry stools, blood in the urine -signs of decreased red blood cells - unusually weak or tired, fainting spells, lightheadedness -breathing problems -chest pain -mouth sores -nausea and vomiting -pain, swelling, redness at site where injected -pain, tingling, numbness in the hands or feet -stomach pain -swelling of ankles, feet, hands -unusual bleeding Side effects that usually do not require medical attention (report to your doctor or health care professional if they continue or are bothersome): -constipation -diarrhea -hair loss -loss of appetite -stomach upset This list may not describe all possible side effects. Call your doctor for medical advice about side effects. You may report side effects to FDA at  1-800-FDA-1088. Where should I keep my medicine? This drug is given in a hospital or clinic and will not be stored at home. NOTE: This sheet is a summary. It may not cover all possible information. If you have questions about this medicine, talk to your doctor, pharmacist, or health care provider.    2016, Elsevier/Gold Standard. (2007-09-17 18:45:54) Ondansetron injection What is this medicine? ONDANSETRON (on DAN se tron) is  used to treat nausea and vomiting caused by chemotherapy. It is also used to prevent or treat nausea and vomiting after surgery. This medicine may be used for other purposes; ask your health care provider or pharmacist if you have questions. What should I tell my health care provider before I take this medicine? They need to know if you have any of these conditions: -heart disease -history of irregular heartbeat -liver disease -low levels of magnesium or potassium in the blood -an unusual or allergic reaction to ondansetron, granisetron, other medicines, foods, dyes, or preservatives -pregnant or trying to get pregnant -breast-feeding How should I use this medicine? This medicine is for infusion into a vein. It is given by a health care professional in a hospital or clinic setting. Talk to your pediatrician regarding the use of this medicine in children. Special care may be needed. Overdosage: If you think you have taken too much of this medicine contact a poison control center or emergency room at once. NOTE: This medicine is only for you. Do not share this medicine with others. What if I miss a dose? This does not apply. What may interact with this medicine? Do not take this medicine with any of the following medications: -apomorphine -certain medicines for fungal infections like fluconazole, itraconazole, ketoconazole, posaconazole, voriconazole -cisapride -dofetilide -dronedarone -pimozide -thioridazine -ziprasidone This medicine may also interact with  the following medications: -carbamazepine -certain medicines for depression, anxiety, or psychotic disturbances -fentanyl -linezolid -MAOIs like Carbex, Eldepryl, Marplan, Nardil, and Parnate -methylene blue (injected into a vein) -other medicines that prolong the QT interval (cause an abnormal heart rhythm) -phenytoin -rifampicin -tramadol This list may not describe all possible interactions. Give your health care provider a list of all the medicines, herbs, non-prescription drugs, or dietary supplements you use. Also tell them if you smoke, drink alcohol, or use illegal drugs. Some items may interact with your medicine. What should I watch for while using this medicine? Your condition will be monitored carefully while you are receiving this medicine. What side effects may I notice from receiving this medicine? Side effects that you should report to your doctor or health care professional as soon as possible: -allergic reactions like skin rash, itching or hives, swelling of the face, lips, or tongue -breathing problems -confusion -dizziness -fast or irregular heartbeat -feeling faint or lightheaded, falls -fever and chills -loss of balance or coordination -seizures -sweating -swelling of the hands and feet -tightness in the chest -tremors -unusually weak or tired Side effects that usually do not require medical attention (report to your doctor or health care professional if they continue or are bothersome): -constipation or diarrhea -headache This list may not describe all possible side effects. Call your doctor for medical advice about side effects. You may report side effects to FDA at 1-800-FDA-1088. Where should I keep my medicine? This drug is given in a hospital or clinic and will not be stored at home. NOTE: This sheet is a summary. It may not cover all possible information. If you have questions about this medicine, talk to your doctor, pharmacist, or health care  provider.    2016, [employer]/Gold Standard. (2013-02-12 16:18:28) Dexamethasone injection What is this medicine? DEXAMETHASONE (dex a METH a sone) is a corticosteroid. It is used to treat inflammation of the skin, joints, lungs, and other organs. Common conditions treated include asthma, allergies, and arthritis. It is also used for other conditions, like blood disorders and diseases of the adrenal glands. This medicine may be used for other  purposes; ask your health care provider or pharmacist if you have questions. What should I tell my health care provider before I take this medicine? They need to know if you have any of these conditions: -blood clotting problems -Cushing's syndrome -diabetes -glaucoma -heart problems or disease -high blood pressure -infection like herpes, measles, tuberculosis, or chickenpox -kidney disease -liver disease -mental problems -myasthenia gravis -osteoporosis -previous heart attack -seizures -stomach, ulcer or intestine disease including colitis and diverticulitis -thyroid problem -an unusual or allergic reaction to dexamethasone, corticosteroids, other medicines, lactose, foods, dyes, or preservatives -pregnant or trying to get pregnant -breast-feeding How should I use this medicine? This medicine is for injection into a muscle, joint, lesion, soft tissue, or vein. It is given by a health care professional in a hospital or clinic setting. Talk to your pediatrician regarding the use of this medicine in children. Special care may be needed. Overdosage: If you think you have taken too much of this medicine contact a poison control center or emergency room at once. NOTE: This medicine is only for you. Do not share this medicine with others. What if I miss a dose? This may not apply. If you are having a series of injections over a prolonged period, try not to miss an appointment. Call your doctor or health care professional to reschedule if you are  unable to keep an appointment. What may interact with this medicine? Do not take this medicine with any of the following medications: -mifepristone, RU-486 -vaccines This medicine may also interact with the following medications: -amphotericin B -antibiotics like clarithromycin, erythromycin, and troleandomycin -aspirin and aspirin-like drugs -barbiturates like phenobarbital -carbamazepine -cholestyramine -cholinesterase inhibitors like donepezil, galantamine, rivastigmine, and tacrine -cyclosporine -digoxin -diuretics -ephedrine -male hormones, like estrogens or progestins and birth control pills -indinavir -isoniazid -ketoconazole -medicines for diabetes -medicines that improve muscle tone or strength for conditions like myasthenia gravis -NSAIDs, medicines for pain and inflammation, like ibuprofen or naproxen -phenytoin -rifampin -thalidomide -warfarin This list may not describe all possible interactions. Give your health care provider a list of all the medicines, herbs, non-prescription drugs, or dietary supplements you use. Also tell them if you smoke, drink alcohol, or use illegal drugs. Some items may interact with your medicine. What should I watch for while using this medicine? Your condition will be monitored carefully while you are receiving this medicine. If you are taking this medicine for a long time, carry an identification card with your name and address, the type and dose of your medicine, and your doctor's name and address. This medicine may increase your risk of getting an infection. Stay away from people who are sick. Tell your doctor or health care professional if you are around anyone with measles or chickenpox. Talk to your health care provider before you get any vaccines that you take this medicine. If you are going to have surgery, tell your doctor or health care professional that you have taken this medicine within the last twelve months. Ask your doctor  or health care professional about your diet. You may need to lower the amount of salt you eat. The medicine can increase your blood sugar. If you are a diabetic check with your doctor if you need help adjusting the dose of your diabetic medicine. What side effects may I notice from receiving this medicine? Side effects that you should report to your doctor or health care professional as soon as possible: -allergic reactions like skin rash, itching or hives, swelling of the face, lips, or tongue -  black or tarry stools -change in the amount of urine -changes in vision -confusion, excitement, restlessness, a false sense of well-being -fever, sore throat, sneezing, cough, or other signs of infection, wounds that will not heal -hallucinations -increased thirst -mental depression, mood swings, mistaken feelings of self importance or of being mistreated -pain in hips, back, ribs, arms, shoulders, or legs -pain, redness, or irritation at the injection site -redness, blistering, peeling or loosening of the skin, including inside the mouth -rounding out of face -swelling of feet or lower legs -unusual bleeding or bruising -unusual tired or weak -wounds that do not heal Side effects that usually do not require medical attention (report to your doctor or health care professional if they continue or are bothersome): -diarrhea or constipation -change in taste -headache -nausea, vomiting -skin problems, acne, thin and shiny skin -touble sleeping -unusual growth of hair on the face or body -weight gain This list may not describe all possible side effects. Call your doctor for medical advice about side effects. You may report side effects to FDA at 1-800-FDA-1088. Where should I keep my medicine? This drug is given in a hospital or clinic and will not be stored at home. NOTE: This sheet is a summary. It may not cover all possible information. If you have questions about this medicine, talk to your  doctor, pharmacist, or health care provider.    2016, [employer]. (2007-08-29 14:04:12) Lidocaine; Prilocaine cream What is this medicine? LIDOCAINE; PRILOCAINE (LYE doe kane; PRIL oh kane) is a topical anesthetic that causes loss of feeling in the skin and surrounding tissues. It is used to numb the skin before procedures or injections. This medicine may be used for other purposes; ask your health care provider or pharmacist if you have questions. What should I tell my health care provider before I take this medicine? They need to know if you have any of these conditions: -glucose-6-phosphate deficiencies -heart disease -kidney or liver disease -methemoglobinemia -an unusual or allergic reaction to lidocaine, prilocaine, other medicines, foods, dyes, or preservatives -pregnant or trying to get pregnant -breast-feeding How should I use this medicine? This medicine is for external use only on the skin. Do not take by mouth. Follow the directions on the prescription label. Wash hands before and after use. Do not use more or leave in contact with the skin longer than directed. Do not apply to eyes or open wounds. It can cause irritation and blurred or temporary loss of vision. If this medicine comes in contact with your eyes, immediately rinse the eye with water. Do not touch or rub the eye. Contact your health care provider right away. Talk to your pediatrician regarding the use of this medicine in children. While this medicine may be prescribed for children for selected conditions, precautions do apply. Overdosage: If you think you have taken too much of this medicine contact a poison control center or emergency room at once. NOTE: This medicine is only for you. Do not share this medicine with others. What if I miss a dose? This medicine is usually only applied once prior to each procedure. It must be in contact with the skin for a period of time for it to work. If you applied this  medicine later than directed, tell your health care professional before starting the procedure. What may interact with this medicine? -acetaminophen -chloroquine -dapsone -medicines to control heart rhythm -nitrates like nitroglycerin and nitroprusside -other ointments, creams, or sprays that may contain anesthetic medicine -phenobarbital -phenytoin -quinine -sulfonamides  like sulfacetamide, sulfamethoxazole, sulfasalazine and others This list may not describe all possible interactions. Give your health care provider a list of all the medicines, herbs, non-prescription drugs, or dietary supplements you use. Also tell them if you smoke, drink alcohol, or use illegal drugs. Some items may interact with your medicine. What should I watch for while using this medicine? Be careful to avoid injury to the treated area while it is numb and you are not aware of pain. Avoid scratching, rubbing, or exposing the treated area to hot or cold temperatures until complete sensation has returned. The numb feeling will wear off a few hours after applying the cream. What side effects may I notice from receiving this medicine? Side effects that you should report to your doctor or health care professional as soon as possible: -blurred vision -chest pain -difficulty breathing -dizziness -drowsiness -fast or irregular heartbeat -skin rash or itching -swelling of your throat, lips, or face -trembling Side effects that usually do not require medical attention (report to your doctor or health care professional if they continue or are bothersome): -changes in ability to feel hot or cold -redness and swelling at the application site This list may not describe all possible side effects. Call your doctor for medical advice about side effects. You may report side effects to FDA at 1-800-FDA-1088. Where should I keep my medicine? Keep out of reach of children. Store at room temperature between 15 and 30 degrees C (59  and 86 degrees F). Keep container tightly closed. Throw away any unused medicine after the expiration date. NOTE: This sheet is a summary. It may not cover all possible information. If you have questions about this medicine, talk to your doctor, pharmacist, or health care provider.    2016, [employer] Standard. (2007-11-11 17:14:35) Ondansetron tablets What is this medicine? ONDANSETRON (on DAN se tron) is used to treat nausea and vomiting caused by chemotherapy. It is also used to prevent or treat nausea and vomiting after surgery. This medicine may be used for other purposes; ask your health care provider or pharmacist if you have questions. What should I tell my health care provider before I take this medicine? They need to know if you have any of these conditions: -heart disease -history of irregular heartbeat -liver disease -low levels of magnesium or potassium in the blood -an unusual or allergic reaction to ondansetron, granisetron, other medicines, foods, dyes, or preservatives -pregnant or trying to get pregnant -breast-feeding How should I use this medicine? Take this medicine by mouth with a glass of water. Follow the directions on your prescription label. Take your doses at regular intervals. Do not take your medicine more often than directed. Talk to your pediatrician regarding the use of this medicine in children. Special care may be needed. Overdosage: If you think you have taken too much of this medicine contact a poison control center or emergency room at once. NOTE: This medicine is only for you. Do not share this medicine with others. What if I miss a dose? If you miss a dose, take it as soon as you can. If it is almost time for your next dose, take only that dose. Do not take double or extra doses. What may interact with this medicine? Do not take this medicine with any of the following medications: -apomorphine -certain medicines for fungal infections like  fluconazole, itraconazole, ketoconazole, posaconazole, voriconazole -cisapride -dofetilide -dronedarone -pimozide -thioridazine -ziprasidone This medicine may also interact with the following medications: -carbamazepine -certain medicines for depression,  anxiety, or psychotic disturbances -fentanyl -linezolid -MAOIs like Carbex, Eldepryl, Marplan, Nardil, and Parnate -methylene blue (injected into a vein) -other medicines that prolong the QT interval (cause an abnormal heart rhythm) -phenytoin -rifampicin -tramadol This list may not describe all possible interactions. Give your health care provider a list of all the medicines, herbs, non-prescription drugs, or dietary supplements you use. Also tell them if you smoke, drink alcohol, or use illegal drugs. Some items may interact with your medicine. What should I watch for while using this medicine? Check with your doctor or health care professional right away if you have any sign of an allergic reaction. What side effects may I notice from receiving this medicine? Side effects that you should report to your doctor or health care professional as soon as possible: -allergic reactions like skin rash, itching or hives, swelling of the face, lips or tongue -breathing problems -confusion -dizziness -fast or irregular heartbeat -feeling faint or lightheaded, falls -fever and chills -loss of balance or coordination -seizures -sweating -swelling of the hands or feet -tightness in the chest -tremors -unusually weak or tired Side effects that usually do not require medical attention (report to your doctor or health care professional if they continue or are bothersome): -constipation or diarrhea -headache This list may not describe all possible side effects. Call your doctor for medical advice about side effects. You may report side effects to FDA at 1-800-FDA-1088. Where should I keep my medicine? Keep out of the reach of  children. Store between 2 and 30 degrees C (36 and 86 degrees F). Throw away any unused medicine after the expiration date. NOTE: This sheet is a summary. It may not cover all possible information. If you have questions about this medicine, talk to your doctor, pharmacist, or health care provider.    2016, [employer] Standard. (2013-02-12 16:27:45) Prochlorperazine tablets What is this medicine? PROCHLORPERAZINE (proe klor PER a zeen) helps to control severe nausea and vomiting. This medicine is also used to treat schizophrenia. It can also help patients who experience anxiety that is not due to psychological illness. This medicine may be used for other purposes; ask your health care provider or pharmacist if you have questions. What should I tell my health care provider before I take this medicine? They need to know if you have any of these conditions: -blood disorders or disease -dementia -liver disease or jaundice -Parkinson's disease -uncontrollable movement disorder -an unusual or allergic reaction to prochlorperazine, other medicines, foods, dyes, or preservatives -pregnant or trying to get pregnant -breast-feeding How should I use this medicine? Take this medicine by mouth with a glass of water. Follow the directions on the prescription label. Take your doses at regular intervals. Do not take your medicine more often than directed. Do not stop taking this medicine suddenly. This can cause nausea, vomiting, and dizziness. Ask your doctor or health care professional for advice. Talk to your pediatrician regarding the use of this medicine in children. Special care may be needed. While this drug may be prescribed for children as young as 2 years for selected conditions, precautions do apply. Overdosage: If you think you have taken too much of this medicine contact a poison control center or emergency room at once. NOTE: This medicine is only for you. Do not share this medicine with  others. What if I miss a dose? If you miss a dose, take it as soon as you can. If it is almost time for your next dose, take only that dose. Do  not take double or extra doses. What may interact with this medicine? Do not take this medicine with any of the following medications: -amoxapine -antidepressants like citalopram, escitalopram, fluoxetine, paroxetine, and sertraline -deferoxamine -dofetilide -maprotiline -tricyclic antidepressants like amitriptyline, clomipramine, imipramine, nortiptyline and others This medicine may also interact with the following medications: -lithium -medicines for pain -phenytoin -propranolol -warfarin This list may not describe all possible interactions. Give your health care provider a list of all the medicines, herbs, non-prescription drugs, or dietary supplements you use. Also tell them if you smoke, drink alcohol, or use illegal drugs. Some items may interact with your medicine. What should I watch for while using this medicine? Visit your doctor or health care professional for regular checks on your progress. You may get drowsy or dizzy. Do not drive, use machinery, or do anything that needs mental alertness until you know how this medicine affects you. Do not stand or sit up quickly, especially if you are an older patient. This reduces the risk of dizzy or fainting spells. Alcohol may interfere with the effect of this medicine. Avoid alcoholic drinks. This medicine can reduce the response of your body to heat or cold. Dress warm in cold weather and stay hydrated in hot weather. If possible, avoid extreme temperatures like saunas, hot tubs, very hot or cold showers, or activities that can cause dehydration such as vigorous exercise. This medicine can make you more sensitive to the sun. Keep out of the sun. If you cannot avoid being in the sun, wear protective clothing and use sunscreen. Do not use sun lamps or tanning beds/booths. Your mouth may get dry.  Chewing sugarless gum or sucking hard candy, and drinking plenty of water may help. Contact your doctor if the problem does not go away or is severe. What side effects may I notice from receiving this medicine? Side effects that you should report to your doctor or health care professional as soon as possible: -blurred vision -breast enlargement in men or women -breast milk in women who are not breast-feeding -chest pain, fast or irregular heartbeat -confusion, restlessness -dark yellow or brown urine -difficulty breathing or swallowing -dizziness or fainting spells -drooling, shaking, movement difficulty (shuffling walk) or rigidity -fever, chills, sore throat -involuntary or uncontrollable movements of the eyes, mouth, head, arms, and legs -seizures -stomach area pain -unusually weak or tired -unusual bleeding or bruising -yellowing of skin or eyes Side effects that usually do not require medical attention (report to your doctor or health care professional if they continue or are bothersome): -difficulty passing urine -difficulty sleeping -headache -sexual dysfunction -skin rash, or itching This list may not describe all possible side effects. Call your doctor for medical advice about side effects. You may report side effects to FDA at 1-800-FDA-1088. Where should I keep my medicine? Keep out of the reach of children. Store at room temperature between 15 and 30 degrees C (59 and 86 degrees F). Protect from light. Throw away any unused medicine after the expiration date. NOTE: This sheet is a summary. It may not cover all possible information. If you have questions about this medicine, talk to your doctor, pharmacist, or health care provider.    2016, [employer] Standard. (2011-09-26 16:59:39) Calcium; Vitamin D oral tablets What is this medicine? CALCIUM; VITAMIN D (KAL see um; VYE ta min D) is a vitamin supplement. It is used to prevent conditions of low calcium and vitamin  D. This medicine may be used for other purposes; ask your health care provider  or pharmacist if you have questions. What should I tell my health care provider before I take this medicine? They need to know if you have any of these conditions: -constipation -dehydration -heart disease -high level of calcium or vitamin D in the blood -high level of phosphate in the blood -kidney disease -kidney stones -liver disease -parathyroid disease -sarcoidosis -stomach ulcer or obstruction -an unusual or allergic reaction to calcium, vitamin D, tartrazine dye, other medicines, foods, dyes, or preservatives -pregnant or trying to get pregnant -breast-feeding How should I use this medicine? Take this medicine by mouth with a glass of water. Follow the directions on the label. Take with food or within 1 hour after a meal. Take your medicine at regular intervals. Do not take your medicine more often than directed. Talk to your pediatrician regarding the use of this medicine in children. While this medicine may be used in children for selected conditions, precautions do apply. Overdosage: If you think you have taken too much of this medicine contact a poison control center or emergency room at once. NOTE: This medicine is only for you. Do not share this medicine with others. What if I miss a dose? If you miss a dose, take it as soon as you can. If it is almost time for your next dose, take only that dose. Do not take double or extra doses. What may interact with this medicine? Do not take this medicine with any of the following medications: -ammonium chloride -methenamine This medicine may also interact with the following medications: -antibiotics like ciprofloxacin, gatifloxacin, tetracycline -captopril -delavirdine -diuretics -gabapentin -iron supplements -medicines for fungal infections like ketoconazole and itraconazole -medicines for seizures like ethotoin and phenytoin -mineral  oil -mycophenolate -other vitamins with calcium, vitamin D, or minerals -quinidine -rosuvastatin -sucralfate -thyroid medicine This list may not describe all possible interactions. Give your health care provider a list of all the medicines, herbs, non-prescription drugs, or dietary supplements you use. Also tell them if you smoke, drink alcohol, or use illegal drugs. Some items may interact with your medicine. What should I watch for while using this medicine? Taking this medicine is not a substitute for a well-balanced diet and exercise. Talk with your doctor or health care provider and follow a healthy lifestyle. Do not take this medicine with high-fiber foods, large amounts of alcohol, or drinks containing caffeine. Do not take this medicine within 2 hours of any other medicines. What side effects may I notice from receiving this medicine? Side effects that you should report to your doctor or health care professional as soon as possible: -allergic reactions like skin rash, itching or hives, swelling of the face, lips, or tongue -confusion -dry mouth -high blood pressure -increased hunger or thirst -increased urination -irregular heartbeat -metallic taste -muscle or bone pain -pain when urinating -seizure -unusually weak or tired -weight loss Side effects that usually do not require medical attention (report to your doctor or health care professional if they continue or are bothersome): -constipation -diarrhea -headache -loss of appetite -nausea, vomiting -stomach upset This list may not describe all possible side effects. Call your doctor for medical advice about side effects. You may report side effects to FDA at 1-800-FDA-1088. Where should I keep my medicine? Keep out of the reach of children. Store at room temperature between 15 and 30 degrees C (59 and 86 degrees F). Protect from light. Keep container tightly closed. Throw away any unused medicine after the expiration  date. NOTE: This sheet is a summary. It  may not cover all possible information. If you have questions about this medicine, talk to your doctor, pharmacist, or health care provider.    2016, [employer]. (2007-08-21 17:56:23) Denosumab injection What is this medicine? DENOSUMAB (den oh sue mab) slows bone breakdown. Prolia is used to treat osteoporosis in women after menopause and in men. Delton See is used to prevent bone fractures and other bone problems caused by cancer bone metastases. Delton See is also used to treat giant cell tumor of the bone. This medicine may be used for other purposes; ask your health care provider or pharmacist if you have questions. What should I tell my health care provider before I take this medicine? They need to know if you have any of these conditions: -dental disease -eczema -infection or history of infections -kidney disease or on dialysis -low blood calcium or vitamin D -malabsorption syndrome -scheduled to have surgery or tooth extraction -taking medicine that contains denosumab -thyroid or parathyroid disease -an unusual reaction to denosumab, other medicines, foods, dyes, or preservatives -pregnant or trying to get pregnant -breast-feeding How should I use this medicine? This medicine is for injection under the skin. It is given by a health care professional in a hospital or clinic setting. If you are getting Prolia, a special MedGuide will be given to you by the pharmacist with each prescription and refill. Be sure to read this information carefully each time. For Prolia, talk to your pediatrician regarding the use of this medicine in children. Special care may be needed. For Delton See, talk to your pediatrician regarding the use of this medicine in children. While this drug may be prescribed for children as young as 13 years for selected conditions, precautions do apply. Overdosage: If you think you have taken too much of this medicine contact a  poison control center or emergency room at once. NOTE: This medicine is only for you. Do not share this medicine with others. What if I miss a dose? It is important not to miss your dose. Call your doctor or health care professional if you are unable to keep an appointment. What may interact with this medicine? Do not take this medicine with any of the following medications: -other medicines containing denosumab This medicine may also interact with the following medications: -medicines that suppress the immune system -medicines that treat cancer -steroid medicines like prednisone or cortisone This list may not describe all possible interactions. Give your health care provider a list of all the medicines, herbs, non-prescription drugs, or dietary supplements you use. Also tell them if you smoke, drink alcohol, or use illegal drugs. Some items may interact with your medicine. What should I watch for while using this medicine? Visit your doctor or health care professional for regular checks on your progress. Your doctor or health care professional may order blood tests and other tests to see how you are doing. Call your doctor or health care professional if you get a cold or other infection while receiving this medicine. Do not treat yourself. This medicine may decrease your body's ability to fight infection. You should make sure you get enough calcium and vitamin D while you are taking this medicine, unless your doctor tells you not to. Discuss the foods you eat and the vitamins you take with your health care professional. See your dentist regularly. Brush and floss your teeth as directed. Before you have any dental work done, tell your dentist you are receiving this medicine. Do not become pregnant while taking this medicine or for  5 months after stopping it. Women should inform their doctor if they wish to become pregnant or think they might be pregnant. There is a potential for serious side effects  to an unborn child. Talk to your health care professional or pharmacist for more information. What side effects may I notice from receiving this medicine? Side effects that you should report to your doctor or health care professional as soon as possible: -allergic reactions like skin rash, itching or hives, swelling of the face, lips, or tongue -breathing problems -chest pain -fast, irregular heartbeat -feeling faint or lightheaded, falls -fever, chills, or any other sign of infection -muscle spasms, tightening, or twitches -numbness or tingling -skin blisters or bumps, or is dry, peels, or red -slow healing or unexplained pain in the mouth or jaw -unusual bleeding or bruising Side effects that usually do not require medical attention (Report these to your doctor or health care professional if they continue or are bothersome.): -muscle pain -stomach upset, gas This list may not describe all possible side effects. Call your doctor for medical advice about side effects. You may report side effects to FDA at 1-800-FDA-1088. Where should I keep my medicine? This medicine is only given in a clinic, doctor's office, or other health care setting and will not be stored at home. NOTE: This sheet is a summary. It may not cover all possible information. If you have questions about this medicine, talk to your doctor, pharmacist, or health care provider.    2016, [employer]. (2011-11-06 12:37:47)

## 2015-04-12 NOTE — Patient Instructions (Addendum)
[place] at [facility] Discharge Instructions  RECOMMENDATIONS MADE BY THE CONSULTANT AND ANY TEST RESULTS WILL BE SENT TO YOUR REFERRING PHYSICIAN.   Port a cath insertion scheduled for Tuesday November 22 @ 8:30. Arrive @ 8:30 to [employer]. Go to Short Stay Department.  Nothing to eat or drink past midnight tonight. You are allowed to take your morning meds with a small sip of water. You will need a driver.   We are willing to start your chemo on Wednesday 04/14/15.  We need to get it authorized first. If we don't get it authorized, we will start you next week.  You come in Day 1 & 8 every 21 days. We think you will do well with treatment. The drugs we will use are Carboplatin and Gemzar.  Sometimes people present with cancer after it is in an advanced stage. 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   We are asking Angie our financial counselor to look into your insurance plan to see what you may owe. We can not give an exact amount. She can inquire about your insurance plan. Your plan pays 80/20. Out of pocket is $5,500. You will owe 20% up to $5,500.  After 2 cycles, we will rescan you (CT scan) and see how you are doing and if you are doing good, we keep going with chemo.   Hildred Alamin will be your primary point of contact within the [place]. [phone]. Collie Siad is another nurse who is a good resource. She has voicemail. Please feel free to contact Collie Siad if you can't reach me. Her # is [phone].   The only thing you need to do prior to chemo is this: Apply EMLA cream 1 hour prior to chemo appt time. Glob it on. Do not rub it in. Cover with plastic wrap.  Calcium 1000-1200mg  a day / Vitamin D         Constipation Sheet given, What to Know During Chemo sheet given, Dr. Donald Pore advice sheet given.     Thank you for choosing [place] at [facility] to provide your oncology and hematology care.   To afford each patient quality time with our provider, please arrive at least 15 minutes before your scheduled appointment time.    You need to re-schedule your appointment should you arrive 10 or more minutes late.  We strive to give you quality time with our providers, and arriving late affects you and other patients whose appointments are after yours.  Also, if you no show three or more times for appointments you may be dismissed from the clinic at the providers discretion.     Again, thank you for choosing [facility].  Our hope is that these requests will decrease the amount of time that you wait before being seen by our physicians.       _____________________________________________________________  Should you have questions after your visit to [facility], please contact our office at [phone] between the hours of 8:30 a.m. and 4:30 p.m.  Voicemails left after 4:30 p.m. will not be returned until the following business day.  For prescription refill requests, have your pharmacy contact our office.   Implanted Port Insertion An implanted port is a central line that has a round shape and is placed under the skin. It is used as a long-term IV access for:   Medicines, such as chemotherapy.   Fluids.  Liquid nutrition, such as total parenteral nutrition (TPN).   Blood samples.  LET [facility] CARE PROVIDER KNOW ABOUT:  Allergies to food or medicine.   Medicines taken, including vitamins, herbs, eye drops, creams, and over-the-counter medicines.   Any allergies to heparin.  Use of steroids (by mouth or creams).   Previous problems with anesthetics or numbing medicines.   History of bleeding problems or blood clots.   Previous surgery.   Other health problems, including diabetes and kidney problems.   Possibility of pregnancy, if this applies. RISKS AND COMPLICATIONS Generally, this is a safe procedure. However, as with any procedure,  problems can occur. Possible problems include:  Damage to the blood vessel, bruising, or bleeding at the puncture site.   Infection.  Blood clot in the vessel that the port is in.  Breakdown of the skin over your port.  Very rarely a person may develop a condition called a pneumothorax, a collection of air in the chest that may cause one of the lungs to collapse. The placement of these catheters with the appropriate imaging guidance significantly decreases the risk of a pneumothorax.  BEFORE THE PROCEDURE   Your health care provider may want you to have blood tests. These tests can help tell how well your kidneys and liver are working. They can also show how well your blood clots.   If you take blood thinners (anticoagulant medicines), ask your health care provider when you should stop taking them.   Make arrangements for someone to drive you home. This is necessary if you have been sedated for your procedure.  PROCEDURE  Port insertion usually takes about 30-45 minutes.   An IV needle will be inserted in your arm. Medicine for pain and medicine to help relax you (sedative) will flow directly into your body through this needle.   You will lie on an exam table, and you will be connected to monitors to keep track of your heart rate, blood pressure, and breathing throughout the procedure.  An oxygen monitoring device may be attached to your finger. Oxygen will be given.   Everything will be kept as germ free (sterile) as possible during the procedure. The skin near the point of the incision will be cleansed with antiseptic, and the area will be draped with sterile towels. The skin and deeper tissues over the port area will be made numb with a local anesthetic.  Two small cuts (incisions) will be made in the skin to insert the port. One will be made in the neck to obtain access to the vein where the catheter will lie.   Because the port reservoir will be placed under the skin, a  small skin incision will be made in the upper chest, and a small pocket for the port will be made under the skin. The catheter that will be connected to the port tunnels to a large central vein in the chest. A small, raised area will remain on your body at the site of the reservoir when the procedure is complete.  The port placement will be done under imaging guidance to ensure the proper placement.  The reservoir has a silicone covering that can be punctured with a special needle.   The port will be flushed with normal saline, and blood will be drawn to make sure it is working properly.  There will be nothing remaining outside the skin when the procedure is finished.   Incisions will be held together by stitches, surgical glue, or a  special tape. AFTER THE PROCEDURE  You will stay in a recovery area until the anesthesia has worn off. Your blood pressure and pulse will be checked.  A final chest X-ray will be taken to check the placement of the port and to ensure that there is no injury to your lung.   This information is not intended to replace advice given to you by your health care provider. Make sure you discuss any questions you have with your health care provider.   Document Released: 02/26/2013 Document Revised: 05/29/2014 Document Reviewed: 02/26/2013 Elsevier Interactive Patient Education 2016 [place]. Gemcitabine injection What is this medicine? GEMCITABINE (jem SIT a been) is a chemotherapy drug. This medicine is used to treat many types of cancer like breast cancer, lung cancer, pancreatic cancer, and ovarian cancer. This medicine may be used for other purposes; ask your health care provider or pharmacist if you have questions. What should I tell my health care provider before I take this medicine? They need to know if you have any of these conditions: -blood disorders -infection -kidney disease -liver disease -recent or ongoing radiation therapy -an unusual or  allergic reaction to gemcitabine, other chemotherapy, other medicines, foods, dyes, or preservatives -pregnant or trying to get pregnant -breast-feeding How should I use this medicine? This drug is given as an infusion into a vein. It is administered in a hospital or clinic by a specially trained health care professional. Talk to your pediatrician regarding the use of this medicine in children. Special care may be needed. Overdosage: If you think you have taken too much of this medicine contact a poison control center or emergency room at once. NOTE: This medicine is only for you. Do not share this medicine with others. What if I miss a dose? It is important not to miss your dose. Call your doctor or health care professional if you are unable to keep an appointment. What may interact with this medicine? -medicines to increase blood counts like filgrastim, pegfilgrastim, sargramostim -some other chemotherapy drugs like cisplatin -vaccines Talk to your doctor or health care professional before taking any of these medicines: -acetaminophen -aspirin -ibuprofen -ketoprofen -naproxen This list may not describe all possible interactions. Give your health care provider a list of all the medicines, herbs, non-prescription drugs, or dietary supplements you use. Also tell them if you smoke, drink alcohol, or use illegal drugs. Some items may interact with your medicine. What should I watch for while using this medicine? Visit your doctor for checks on your progress. This drug may make you feel generally unwell. This is not uncommon, as chemotherapy can affect healthy cells as well as cancer cells. Report any side effects. Continue your course of treatment even though you feel ill unless your doctor tells you to stop. In some cases, you may be given additional medicines to help with side effects. Follow all directions for their use. Call your doctor or health care professional for advice if you get a  fever, chills or sore throat, or other symptoms of a cold or flu. Do not treat yourself. This drug decreases your body's ability to fight infections. Try to avoid being around people who are sick. This medicine may increase your risk to bruise or bleed. Call your doctor or health care professional if you notice any unusual bleeding. Be careful brushing and flossing your teeth or using a toothpick because you may get an infection or bleed more easily. If you have any dental work done, tell your dentist you  are receiving this medicine. Avoid taking products that contain aspirin, acetaminophen, ibuprofen, naproxen, or ketoprofen unless instructed by your doctor. These medicines may hide a fever. Women should inform their doctor if they wish to become pregnant or think they might be pregnant. There is a potential for serious side effects to an unborn child. Talk to your health care professional or pharmacist for more information. Do not breast-feed an infant while taking this medicine. What side effects may I notice from receiving this medicine? Side effects that you should report to your doctor or health care professional as soon as possible: -allergic reactions like skin rash, itching or hives, swelling of the face, lips, or tongue -low blood counts - this medicine may decrease the number of white blood cells, red blood cells and platelets. You may be at increased risk for infections and bleeding. -signs of infection - fever or chills, cough, sore throat, pain or difficulty passing urine -signs of decreased platelets or bleeding - bruising, pinpoint red spots on the skin, black, tarry stools, blood in the urine -signs of decreased red blood cells - unusually weak or tired, fainting spells, lightheadedness -breathing problems -chest pain -mouth sores -nausea and vomiting -pain, swelling, redness at site where injected -pain, tingling, numbness in the hands or feet -stomach pain -swelling of ankles,  feet, hands -unusual bleeding Side effects that usually do not require medical attention (report to your doctor or health care professional if they continue or are bothersome): -constipation -diarrhea -hair loss -loss of appetite -stomach upset This list may not describe all possible side effects. Call your doctor for medical advice about side effects. You may report side effects to FDA at 1-800-FDA-1088. Where should I keep my medicine? This drug is given in a hospital or clinic and will not be stored at home. NOTE: This sheet is a summary. It may not cover all possible information. If you have questions about this medicine, talk to your doctor, pharmacist, or health care provider.    2016, Elsevier/Gold Standard. (2007-09-17 18:45:54) Carboplatin injection What is this medicine? CARBOPLATIN (KAR boe pla tin) is a chemotherapy drug. It targets fast dividing cells, like cancer cells, and causes these cells to die. This medicine is used to treat ovarian cancer and many other cancers. This medicine may be used for other purposes; ask your health care provider or pharmacist if you have questions. What should I tell my health care provider before I take this medicine? They need to know if you have any of these conditions: -blood disorders -hearing problems -kidney disease -recent or ongoing radiation therapy -an unusual or allergic reaction to carboplatin, cisplatin, other chemotherapy, other medicines, foods, dyes, or preservatives -pregnant or trying to get pregnant -breast-feeding How should I use this medicine? This drug is usually given as an infusion into a vein. It is administered in a hospital or clinic by a specially trained health care professional. Talk to your pediatrician regarding the use of this medicine in children. Special care may be needed. Overdosage: If you think you have taken too much of this medicine contact a poison control center or emergency room at once. NOTE:  This medicine is only for you. Do not share this medicine with others. What if I miss a dose? It is important not to miss a dose. Call your doctor or health care professional if you are unable to keep an appointment. What may interact with this medicine? -medicines for seizures -medicines to increase blood counts like filgrastim, pegfilgrastim, sargramostim -some  antibiotics like amikacin, gentamicin, neomycin, streptomycin, tobramycin -vaccines Talk to your doctor or health care professional before taking any of these medicines: -acetaminophen -aspirin -ibuprofen -ketoprofen -naproxen This list may not describe all possible interactions. Give your health care provider a list of all the medicines, herbs, non-prescription drugs, or dietary supplements you use. Also tell them if you smoke, drink alcohol, or use illegal drugs. Some items may interact with your medicine. What should I watch for while using this medicine? Your condition will be monitored carefully while you are receiving this medicine. You will need important blood work done while you are taking this medicine. This drug may make you feel generally unwell. This is not uncommon, as chemotherapy can affect healthy cells as well as cancer cells. Report any side effects. Continue your course of treatment even though you feel ill unless your doctor tells you to stop. In some cases, you may be given additional medicines to help with side effects. Follow all directions for their use. Call your doctor or health care professional for advice if you get a fever, chills or sore throat, or other symptoms of a cold or flu. Do not treat yourself. This drug decreases your body's ability to fight infections. Try to avoid being around people who are sick. This medicine may increase your risk to bruise or bleed. Call your doctor or health care professional if you notice any unusual bleeding. Be careful brushing and flossing your teeth or using a  toothpick because you may get an infection or bleed more easily. If you have any dental work done, tell your dentist you are receiving this medicine. Avoid taking products that contain aspirin, acetaminophen, ibuprofen, naproxen, or ketoprofen unless instructed by your doctor. These medicines may hide a fever. Do not become pregnant while taking this medicine. Women should inform their doctor if they wish to become pregnant or think they might be pregnant. There is a potential for serious side effects to an unborn child. Talk to your health care professional or pharmacist for more information. Do not breast-feed an infant while taking this medicine. What side effects may I notice from receiving this medicine? Side effects that you should report to your doctor or health care professional as soon as possible: -allergic reactions like skin rash, itching or hives, swelling of the face, lips, or tongue -signs of infection - fever or chills, cough, sore throat, pain or difficulty passing urine -signs of decreased platelets or bleeding - bruising, pinpoint red spots on the skin, black, tarry stools, nosebleeds -signs of decreased red blood cells - unusually weak or tired, fainting spells, lightheadedness -breathing problems -changes in hearing -changes in vision -chest pain -high blood pressure -low blood counts - This drug may decrease the number of white blood cells, red blood cells and platelets. You may be at increased risk for infections and bleeding. -nausea and vomiting -pain, swelling, redness or irritation at the injection site -pain, tingling, numbness in the hands or feet -problems with balance, talking, walking -trouble passing urine or change in the amount of urine Side effects that usually do not require medical attention (report to your doctor or health care professional if they continue or are bothersome): -hair loss -loss of appetite -metallic taste in the mouth or changes in  taste This list may not describe all possible side effects. Call your doctor for medical advice about side effects. You may report side effects to FDA at 1-800-FDA-1088. Where should I keep my medicine? This drug is given in  a hospital or clinic and will not be stored at home. NOTE: This sheet is a summary. It may not cover all possible information. If you have questions about this medicine, talk to your doctor, pharmacist, or health care provider.    2016, Elsevier/Gold Standard. (2007-08-13 14:38:05) Ondansetron tablets What is this medicine? ONDANSETRON (on DAN se tron) is used to treat nausea and vomiting caused by chemotherapy. It is also used to prevent or treat nausea and vomiting after surgery. This medicine may be used for other purposes; ask your health care provider or pharmacist if you have questions. What should I tell my health care provider before I take this medicine? They need to know if you have any of these conditions: -heart disease -history of irregular heartbeat -liver disease -low levels of magnesium or potassium in the blood -an unusual or allergic reaction to ondansetron, granisetron, other medicines, foods, dyes, or preservatives -pregnant or trying to get pregnant -breast-feeding How should I use this medicine? Take this medicine by mouth with a glass of water. Follow the directions on your prescription label. Take your doses at regular intervals. Do not take your medicine more often than directed. Talk to your pediatrician regarding the use of this medicine in children. Special care may be needed. Overdosage: If you think you have taken too much of this medicine contact a poison control center or emergency room at once. NOTE: This medicine is only for you. Do not share this medicine with others. What if I miss a dose? If you miss a dose, take it as soon as you can. If it is almost time for your next dose, take only that dose. Do not take double or extra  doses. What may interact with this medicine? Do not take this medicine with any of the following medications: -apomorphine -certain medicines for fungal infections like fluconazole, itraconazole, ketoconazole, posaconazole, voriconazole -cisapride -dofetilide -dronedarone -pimozide -thioridazine -ziprasidone This medicine may also interact with the following medications: -carbamazepine -certain medicines for depression, anxiety, or psychotic disturbances -fentanyl -linezolid -MAOIs like Carbex, Eldepryl, Marplan, Nardil, and Parnate -methylene blue (injected into a vein) -other medicines that prolong the QT interval (cause an abnormal heart rhythm) -phenytoin -rifampicin -tramadol This list may not describe all possible interactions. Give your health care provider a list of all the medicines, herbs, non-prescription drugs, or dietary supplements you use. Also tell them if you smoke, drink alcohol, or use illegal drugs. Some items may interact with your medicine. What should I watch for while using this medicine? Check with your doctor or health care professional right away if you have any sign of an allergic reaction. What side effects may I notice from receiving this medicine? Side effects that you should report to your doctor or health care professional as soon as possible: -allergic reactions like skin rash, itching or hives, swelling of the face, lips or tongue -breathing problems -confusion -dizziness -fast or irregular heartbeat -feeling faint or lightheaded, falls -fever and chills -loss of balance or coordination -seizures -sweating -swelling of the hands or feet -tightness in the chest -tremors -unusually weak or tired Side effects that usually do not require medical attention (report to your doctor or health care professional if they continue or are bothersome): -constipation or diarrhea -headache This list may not describe all possible side effects. Call your  doctor for medical advice about side effects. You may report side effects to FDA at 1-800-FDA-1088. Where should I keep my medicine? Keep out of the reach of children. Store  between 2 and 30 degrees C (36 and 86 degrees F). Throw away any unused medicine after the expiration date. NOTE: This sheet is a summary. It may not cover all possible information. If you have questions about this medicine, talk to your doctor, pharmacist, or health care provider.    2016, Elsevier/Gold Standard. (2013-02-12 16:27:45) Prochlorperazine tablets What is this medicine? PROCHLORPERAZINE (proe klor PER a zeen) helps to control severe nausea and vomiting. This medicine is also used to treat schizophrenia. It can also help patients who experience anxiety that is not due to psychological illness. This medicine may be used for other purposes; ask your health care provider or pharmacist if you have questions. What should I tell my health care provider before I take this medicine? They need to know if you have any of these conditions: -blood disorders or disease -dementia -liver disease or jaundice -Parkinson's disease -uncontrollable movement disorder -an unusual or allergic reaction to prochlorperazine, other medicines, foods, dyes, or preservatives -pregnant or trying to get pregnant -breast-feeding How should I use this medicine? Take this medicine by mouth with a glass of water. Follow the directions on the prescription label. Take your doses at regular intervals. Do not take your medicine more often than directed. Do not stop taking this medicine suddenly. This can cause nausea, vomiting, and dizziness. Ask your doctor or health care professional for advice. Talk to your pediatrician regarding the use of this medicine in children. Special care may be needed. While this drug may be prescribed for children as young as 2 years for selected conditions, precautions do apply. Overdosage: If you think you have taken  too much of this medicine contact a poison control center or emergency room at once. NOTE: This medicine is only for you. Do not share this medicine with others. What if I miss a dose? If you miss a dose, take it as soon as you can. If it is almost time for your next dose, take only that dose. Do not take double or extra doses. What may interact with this medicine? Do not take this medicine with any of the following medications: -amoxapine -antidepressants like citalopram, escitalopram, fluoxetine, paroxetine, and sertraline -deferoxamine -dofetilide -maprotiline -tricyclic antidepressants like amitriptyline, clomipramine, imipramine, nortiptyline and others This medicine may also interact with the following medications: -lithium -medicines for pain -phenytoin -propranolol -warfarin This list may not describe all possible interactions. Give your health care provider a list of all the medicines, herbs, non-prescription drugs, or dietary supplements you use. Also tell them if you smoke, drink alcohol, or use illegal drugs. Some items may interact with your medicine. What should I watch for while using this medicine? Visit your doctor or health care professional for regular checks on your progress. You may get drowsy or dizzy. Do not drive, use machinery, or do anything that needs mental alertness until you know how this medicine affects you. Do not stand or sit up quickly, especially if you are an older patient. This reduces the risk of dizzy or fainting spells. Alcohol may interfere with the effect of this medicine. Avoid alcoholic drinks. This medicine can reduce the response of your body to heat or cold. Dress warm in cold weather and stay hydrated in hot weather. If possible, avoid extreme temperatures like saunas, hot tubs, very hot or cold showers, or activities that can cause dehydration such as vigorous exercise. This medicine can make you more sensitive to the sun. Keep out of the sun.  If you cannot avoid being  in the sun, wear protective clothing and use sunscreen. Do not use sun lamps or tanning beds/booths. Your mouth may get dry. Chewing sugarless gum or sucking hard candy, and drinking plenty of water may help. Contact your doctor if the problem does not go away or is severe. What side effects may I notice from receiving this medicine? Side effects that you should report to your doctor or health care professional as soon as possible: -blurred vision -breast enlargement in men or women -breast milk in women who are not breast-feeding -chest pain, fast or irregular heartbeat -confusion, restlessness -dark yellow or brown urine -difficulty breathing or swallowing -dizziness or fainting spells -drooling, shaking, movement difficulty (shuffling walk) or rigidity -fever, chills, sore throat -involuntary or uncontrollable movements of the eyes, mouth, head, arms, and legs -seizures -stomach area pain -unusually weak or tired -unusual bleeding or bruising -yellowing of skin or eyes Side effects that usually do not require medical attention (report to your doctor or health care professional if they continue or are bothersome): -difficulty passing urine -difficulty sleeping -headache -sexual dysfunction -skin rash, or itching This list may not describe all possible side effects. Call your doctor for medical advice about side effects. You may report side effects to FDA at 1-800-FDA-1088. Where should I keep my medicine? Keep out of the reach of children. Store at room temperature between 15 and 30 degrees C (59 and 86 degrees F). Protect from light. Throw away any unused medicine after the expiration date. NOTE: This sheet is a summary. It may not cover all possible information. If you have questions about this medicine, talk to your doctor, pharmacist, or health care provider.    2016, [employer]. (2011-09-26 16:59:39) Lidocaine; Prilocaine cream What is  this medicine? LIDOCAINE; PRILOCAINE (LYE doe kane; PRIL oh kane) is a topical anesthetic that causes loss of feeling in the skin and surrounding tissues. It is used to numb the skin before procedures or injections. This medicine may be used for other purposes; ask your health care provider or pharmacist if you have questions. What should I tell my health care provider before I take this medicine? They need to know if you have any of these conditions: -glucose-6-phosphate deficiencies -heart disease -kidney or liver disease -methemoglobinemia -an unusual or allergic reaction to lidocaine, prilocaine, other medicines, foods, dyes, or preservatives -pregnant or trying to get pregnant -breast-feeding How should I use this medicine? This medicine is for external use only on the skin. Do not take by mouth. Follow the directions on the prescription label. Wash hands before and after use. Do not use more or leave in contact with the skin longer than directed. Do not apply to eyes or open wounds. It can cause irritation and blurred or temporary loss of vision. If this medicine comes in contact with your eyes, immediately rinse the eye with water. Do not touch or rub the eye. Contact your health care provider right away. Talk to your pediatrician regarding the use of this medicine in children. While this medicine may be prescribed for children for selected conditions, precautions do apply. Overdosage: If you think you have taken too much of this medicine contact a poison control center or emergency room at once. NOTE: This medicine is only for you. Do not share this medicine with others. What if I miss a dose? This medicine is usually only applied once prior to each procedure. It must be in contact with the skin for a period of time for it to work.  If you applied this medicine later than directed, tell your health care professional before starting the procedure. What may interact with this  medicine? -acetaminophen -chloroquine -dapsone -medicines to control heart rhythm -nitrates like nitroglycerin and nitroprusside -other ointments, creams, or sprays that may contain anesthetic medicine -phenobarbital -phenytoin -quinine -sulfonamides like sulfacetamide, sulfamethoxazole, sulfasalazine and others This list may not describe all possible interactions. Give your health care provider a list of all the medicines, herbs, non-prescription drugs, or dietary supplements you use. Also tell them if you smoke, drink alcohol, or use illegal drugs. Some items may interact with your medicine. What should I watch for while using this medicine? Be careful to avoid injury to the treated area while it is numb and you are not aware of pain. Avoid scratching, rubbing, or exposing the treated area to hot or cold temperatures until complete sensation has returned. The numb feeling will wear off a few hours after applying the cream. What side effects may I notice from receiving this medicine? Side effects that you should report to your doctor or health care professional as soon as possible: -blurred vision -chest pain -difficulty breathing -dizziness -drowsiness -fast or irregular heartbeat -skin rash or itching -swelling of your throat, lips, or face -trembling Side effects that usually do not require medical attention (report to your doctor or health care professional if they continue or are bothersome): -changes in ability to feel hot or cold -redness and swelling at the application site This list may not describe all possible side effects. Call your doctor for medical advice about side effects. You may report side effects to FDA at 1-800-FDA-1088. Where should I keep my medicine? Keep out of reach of children. Store at room temperature between 15 and 30 degrees C (59 and 86 degrees F). Keep container tightly closed. Throw away any unused medicine after the expiration date. NOTE: This  sheet is a summary. It may not cover all possible information. If you have questions about this medicine, talk to your doctor, pharmacist, or health care provider.    2016, [employer] Standard. (2007-11-11 17:14:35)  Calcium; Vitamin D oral tablets What is this medicine? CALCIUM; VITAMIN D (KAL see um; VYE ta min D) is a vitamin supplement. It is used to prevent conditions of low calcium and vitamin D. This medicine may be used for other purposes; ask your health care provider or pharmacist if you have questions. What should I tell my health care provider before I take this medicine? They need to know if you have any of these conditions: -constipation -dehydration -heart disease -high level of calcium or vitamin D in the blood -high level of phosphate in the blood -kidney disease -kidney stones -liver disease -parathyroid disease -sarcoidosis -stomach ulcer or obstruction -an unusual or allergic reaction to calcium, vitamin D, tartrazine dye, other medicines, foods, dyes, or preservatives -pregnant or trying to get pregnant -breast-feeding How should I use this medicine? Take this medicine by mouth with a glass of water. Follow the directions on the label. Take with food or within 1 hour after a meal. Take your medicine at regular intervals. Do not take your medicine more often than directed. Talk to your pediatrician regarding the use of this medicine in children. While this medicine may be used in children for selected conditions, precautions do apply. Overdosage: If you think you have taken too much of this medicine contact a poison control center or emergency room at once. NOTE: This medicine is only for you. Do not share this  medicine with others. What if I miss a dose? If you miss a dose, take it as soon as you can. If it is almost time for your next dose, take only that dose. Do not take double or extra doses. What may interact with this medicine? Do not take this medicine  with any of the following medications: -ammonium chloride -methenamine This medicine may also interact with the following medications: -antibiotics like ciprofloxacin, gatifloxacin, tetracycline -captopril -delavirdine -diuretics -gabapentin -iron supplements -medicines for fungal infections like ketoconazole and itraconazole -medicines for seizures like ethotoin and phenytoin -mineral oil -mycophenolate -other vitamins with calcium, vitamin D, or minerals -quinidine -rosuvastatin -sucralfate -thyroid medicine This list may not describe all possible interactions. Give your health care provider a list of all the medicines, herbs, non-prescription drugs, or dietary supplements you use. Also tell them if you smoke, drink alcohol, or use illegal drugs. Some items may interact with your medicine. What should I watch for while using this medicine? Taking this medicine is not a substitute for a well-balanced diet and exercise. Talk with your doctor or health care provider and follow a healthy lifestyle. Do not take this medicine with high-fiber foods, large amounts of alcohol, or drinks containing caffeine. Do not take this medicine within 2 hours of any other medicines. What side effects may I notice from receiving this medicine? Side effects that you should report to your doctor or health care professional as soon as possible: -allergic reactions like skin rash, itching or hives, swelling of the face, lips, or tongue -confusion -dry mouth -high blood pressure -increased hunger or thirst -increased urination -irregular heartbeat -metallic taste -muscle or bone pain -pain when urinating -seizure -unusually weak or tired -weight loss Side effects that usually do not require medical attention (report to your doctor or health care professional if they continue or are bothersome): -constipation -diarrhea -headache -loss of appetite -nausea, vomiting -stomach upset This list may not  describe all possible side effects. Call your doctor for medical advice about side effects. You may report side effects to FDA at 1-800-FDA-1088. Where should I keep my medicine? Keep out of the reach of children. Store at room temperature between 15 and 30 degrees C (59 and 86 degrees F). Protect from light. Keep container tightly closed. Throw away any unused medicine after the expiration date. NOTE: This sheet is a summary. It may not cover all possible information. If you have questions about this medicine, talk to your doctor, pharmacist, or health care provider.    2016, [employer] Standard. (2007-08-21 17:56:23)

## 2015-04-12 NOTE — Telephone Encounter (Signed)
PC TO [facility] WITH [facility] °05/22/13-PRESENT °$45 CO PAY FOR OV °80/20 NO DED OOP $5500. $1136.01 HAS BEN MET FOR THE YEAR °CALL REF# [id number] °

## 2015-04-12 NOTE — Progress Notes (Signed)
[facility] Hematology/Oncology Consultation   Name: Harry Franklin      MRN: [id number]    Location: Room/bed info not found  Date: 04/12/2015 Time:5:49 PM   REFERRING PHYSICIAN:  Irine Seal, MD  REASON FOR CONSULT:  Abnormal CT abd/pelvis   DIAGNOSIS:  Metastatic, high grade urothelia cancer  HISTORY OF PRESENT ILLNESS:   Harry Franklin is a pleasant 74 yo white American man with a past medical history significant for tobacco abuse with a 45 pack year smoking history quitting in 1985, GERD, hiatal hernia, HTN who is referred to [facility] after undergoing CT abd/pelvis by Dr. Jeffie Pollock for asymptomatic microscopic hematuria.  I personally reviewed and went over laboratory results with the patient.  The results are noted within this dictation.  I personally reviewed and went over radiographic studies with the patient.  The results are noted within this dictation.    Chart reviewed.  The patient reports that in the early 2000's he saw Dr. Jeffie Pollock for a hydrocele.  He notes that at that time, it was not an issue.  However, recently, he noted some discomfort.  As a result, he reported by Dr. Jeffie Pollock.  However, the patient was informed that his hydrocele should not be causing discomfort.  As a result, further urologic work-up took place.  Part of the work-up included a UA that demonstrated microscopic blood in urine.  This was a new finding compared to a 2015 UA that was negative for blood.  As a result, the patient underwent a cystoscopy which was negative.  Urine cytology was performed that demonstrated findings suspicious for high grade urothelial carcinoma.  CT imaging was performed and results were grossly abnormal.  As a result, Dr. Jeffie Pollock called the clinic.  We got him an appointment in addition to a liver lesion biopsy.  I discussed the case with Dr. Donato Heinz, pathologist, today, and based upon the patient's clinical findings and urine cytology, he is favoring urothelial carcinoma as  well from liver lesion biopsy.  Harry Franklin reports a 60 lb weight loss since Jan 2016, "but I've been trying to lose weight."  He has been managing his food portions and walking more.  He notes that his appetite is very strong.  He denies any B symptoms.  He does not some abdominal pain that is much improved with corticosteroids and oxycodone.   He otherwise denies any complaints.  PAST MEDICAL HISTORY:   Past Medical History  Diagnosis Date  . GERD (gastroesophageal reflux disease)   . Hypokalemia   . Hypertension   . Cataract   . Arthritis   . Macular degeneration, age related   . DVT (deep venous thrombosis) ([place])     developed after traveling x 1, developed another after left foot surgery  . Hydrocele in adult   . 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 Rabeprazole     Other reaction(s): DIARRHEA  . Vancomycin Hives and Rash      MEDICATIONS: I have reviewed the patient's current medications.    Current Outpatient Prescriptions on File Prior to Visit  Medication Sig Dispense Refill  . calcium carbonate (TUMS EX) 750 MG chewable tablet Chew 2 tablets by mouth 2 (two) times daily as needed for heartburn.    . dexamethasone (DECADRON) 4 MG tablet Take 2 tablets twice a day x 3 days. Then take 1 tablet twice a day x 3 days. Then take  1 tablet daily x 3 days. 30 tablet 0  . metoprolol (LOPRESSOR) 100 MG tablet Take 100 mg by mouth at bedtime.     . Multiple Vitamins-Minerals (PRESERVISION AREDS 2 PO) Take 1 tablet by mouth 2 (two) times daily.    Marland Kitchen omeprazole (PRILOSEC) 40 MG capsule Take 40 mg by mouth daily.    Marland Kitchen oxyCODONE (OXY IR/ROXICODONE) 5 MG immediate release tablet Take 1-2 tablets (5-10 mg total) by mouth every 4 (four) hours as needed for severe pain. 60 tablet 0  . Probiotic Product (ALIGN PO) Take 1 tablet by mouth daily.    . ranitidine (ZANTAC) 150 MG capsule Take 150 mg by mouth 2 (two) times daily.      Marland Kitchen aspirin 325 MG tablet Take 325 mg by mouth daily.     No current facility-administered medications on file prior to visit.     PAST SURGICAL HISTORY Past Surgical History  Procedure Laterality Date  . Replacement total knee    . Appendectomy    . Hammer toe surgery Left   . Lapidus procedure    . Hallux fusion    . Colonoscopy  03/31/05  . Colonoscopy N/A 10/02/2014    [id number] diverticulosis  . Esophagogastroduodenoscopy N/A 10/02/2014    [id number] ulcerative reflux/s/p dilation/large HH  . Maloney dilation N/A 10/02/2014    Procedure: Venia Minks DILATION;  Surgeon: Daneil Dolin, MD;  [facility];  Service: Endoscopy;  Laterality: N/A;  . Esophagogastroduodenoscopy N/A 01/29/2015    Procedure: ESOPHAGOGASTRODUODENOSCOPY (EGD);  Surgeon: Daneil Dolin, MD;  Location: [facility];  Service: Endoscopy;  Laterality: N/A;  0800-rescheduled to 9/9 @ 915 Candy notified pt  . Esophageal dilation  01/29/2015    Procedure: ESOPHAGEAL DILATION;  Surgeon: Daneil Dolin, MD;  Location: [facility];  Service: Endoscopy;;    FAMILY HISTORY: Family History  Problem Relation Age of Onset  . Colon cancer Maternal Uncle   . Heart disease Father     SOCIAL HISTORY:  reports that he quit smoking about 31 years ago. His smoking use included Cigarettes. He has a 31 pack-year smoking history. He does not have any smokeless tobacco history on file. He reports that he does not drink alcohol or use illicit drugs.  PERFORMANCE STATUS: The patient's performance status is 1 - Symptomatic but completely ambulatory  PHYSICAL EXAM: Most Recent Vital Signs: Blood pressure 138/81, pulse 79, temperature 97.8 F (36.6 C), temperature source Oral, resp. rate 22, height 6' 3.5" (1.918 m), weight 268 lb 6.4 oz (121.745 kg), SpO2 96 %. General appearance: alert, cooperative, appears stated age, no distress and robust man, accompanied by his daughter, sister, and brother-in-law. Head:  Normocephalic, without obvious abnormality, atraumatic Eyes: negative findings: lids and lashes normal, conjunctivae and sclerae normal, corneas clear and pupils equal, round, reactive to light and accomodation Throat: normal findings: lips normal without lesions, buccal mucosa normal, tongue midline and normal and oropharynx pink & moist without lesions or evidence of thrush and abnormal findings: dentition: upper and lower dentures Neck: no adenopathy and supple, symmetrical, trachea midline Lungs: clear to auscultation bilaterally and normal percussion bilaterally Heart: regular rate and rhythm, S1, S2 normal, no murmur, click, rub or gallop Abdomen: normal findings: no masses palpable, soft, non-tender and spleen non-palpable and abnormal findings:  hepatomegaly, liver edge palpable 3 cm below costal margin Extremities: extremities normal, atraumatic, no cyanosis or edema Skin: Skin color, texture, turgor normal. No rashes or lesions Lymph nodes: Cervical, supraclavicular, and  axillary nodes normal. Neurologic: Alert and oriented X 3, normal strength and tone. Normal symmetric reflexes. Normal coordination and gait  LABORATORY DATA:  CBC    Component Value Date/Time   WBC 8.2 04/08/2015 1137   RBC 4.32 04/08/2015 1137   HGB 13.3 04/08/2015 1137   HCT 39.8 04/08/2015 1137   PLT 258 04/08/2015 1137   MCV 92.1 04/08/2015 1137   MCH 30.8 04/08/2015 1137   MCHC 33.4 04/08/2015 1137   RDW 13.1 04/08/2015 1137   LYMPHSABS 1.0 06/30/2013 0554   MONOABS 0.9 06/30/2013 0554   EOSABS 0.1 06/30/2013 0554   BASOSABS 0.0 06/30/2013 0554      Chemistry      Component Value Date/Time   NA 138 06/30/2013 0554   K 3.8 06/30/2013 0554   CL 102 06/30/2013 0554   CO2 22 06/30/2013 0554   BUN 21 06/30/2013 0554   CREATININE 1.40* 04/02/2015 1208      Component Value Date/Time   CALCIUM 8.5 06/30/2013 0554     PSA is 1.34    RADIOGRAPHY:  CLINICAL DATA: Asymptomatic microscopic  hematuria. Appendectomy.  EXAM: CT ABDOMEN AND PELVIS WITHOUT AND WITH CONTRAST  TECHNIQUE: Multidetector CT imaging of the abdomen and pelvis was performed following the standard protocol before and following the bolus administration of intravenous contrast.  CONTRAST: 151mL OMNIPAQUE IOHEXOL 300 MG/ML SOLN  COMPARISON: None.  FINDINGS: Lower chest: Basilar left lower lobe 6 mm solid pulmonary nodule (series 11/image 13). Left anterior descending, left circumflex and right coronary atherosclerosis. Patulous lower thoracic esophagus containing fluid and demonstrating circumferential mild esophageal wall thickening. Mildly enlarged 1.0 cm right pericardial phrenic node (series 2/ image 11).  Hepatobiliary: There is a hypoenhancing 3.2 x 2.4 cm segment 4B left liver lobe mass (series 3/ image 31). There is a hypoenhancing 2.4 x 1.8 cm segment 8 right liver lobe mass (3/13). There are innumerable additional smaller hypodense lesions scattered throughout the liver (greater than 20), most of which are subcentimeter in size. Normal gallbladder with no radiopaque cholelithiasis. No biliary ductal dilatation.  Pancreas: Fatty infiltration of the otherwise normal appearing pancreas. No main pancreatic duct dilation. No pancreatic mass.  Spleen: Normal size. No mass.  Adrenals/Urinary Tract: There are bilateral solid adrenal masses measuring 4.2 cm on the right and 5.2 cm on the left. No hydronephrosis. No nephrolithiasis. No renal cortical masses. Normal caliber ureters, with no ureteral stones. On delayed imaging, there is no urothelial wall thickening and there are no filling defects in the opacified portions of the bilateral collecting systems or ureters, noting limited evaluation of the non-opacified midportion of the right ureter. Mild diffuse bladder wall thickening. Small 1.2 x 1.1 cm right posterior bladder wall diverticulum. No focal bladder lesion. No bladder  stones.  Stomach/Bowel: Grossly normal stomach. Normal caliber small bowel with no small bowel wall thickening. The appendix is not discretely visualized. Mild sigmoid diverticulosis. No large bowel wall thickening or pericolonic fat stranding. Normal large bowel with no diverticulosis, large bowel wall thickening or pericolonic fat stranding.  Vascular/Lymphatic: Atherosclerotic nonaneurysmal abdominal aorta. Patent portal, splenic, hepatic and renal veins. There is extensive retroperitoneal adenopathy in the gastrohepatic ligament, porta hepatis, portacaval, aortocaval and left para-aortic chains. For example, there is a bulky 3.6 cm portacaval node (series 3/ image 31). There is a moderately enlarged 1.7 cm posterior paracaval node (3/30). There is a moderately enlarged 2.0 cm left para-aortic node (3/39). There is mild-to-moderate bilateral common and external iliac and mild bilateral inguinal lymphadenopathy.  For example, there is a mildly-to-moderately enlarged 1.6 cm right external iliac node (3/79 and there is a mildly enlarged 1.1 cm left common iliac node (3/64).  Reproductive: Top-normal size prostate.  Other: No pneumoperitoneum. Trace pelvic ascites and diffuse mesenteric edema. No focal fluid collection.  Musculoskeletal: Destructive lytic lesion in the right L2 vertebral body. Marked degenerative changes in the visualized thoracolumbar spine. Small fat containing left inguinal hernia.  IMPRESSION: 1. 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 2. 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 3. Extensive abdominopelvic lymphadenopathy, likely nodal metastases. 4. Left lower lobe 6 mm solid pulmonary nodule, suspicious for a pulmonary metastasis. 5. 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 6. Circumferential wall thickening in the lower thoracic esophagus with  associated paraesophageal lymphadenopathy, nonspecific, but suspicious for lower thoracic esophageal neoplasm. Recommend correlation with upper endoscopy. 7. No urolithiasis. No hydronephrosis. No renal cortical masses. No urothelial lesions, with limitations as described. 8. Mild diffuse bladder wall thickening with small bladder diverticulum, suggesting chronic bladder outlet obstruction. Recommend correlation with urinalysis. 9. Three-vessel coronary atherosclerosis. These results will be called to the ordering clinician or representative by the Radiologist Assistant, and communication documented in the PACS or zVision Dashboard.   Electronically Signed  By: Ilona Sorrel M.D.  On: 04/02/2015 13:26    PATHOLOGY:    Diagnosis Liver, needle/core biopsy, right - METASTATIC HIGH GRADE CARCINOMA, SEE COMMENT. Microscopic Comment The recent history of urine cytology demonstrating features suspicious for high grade urothelial carcinoma is noted. In the current case, core biopsies demonstrate extensive involvement by high grade carcinoma with epithelioid and spindle cell features. There is extensive associated tumor necrosis present. The tumor demonstrates strong diffuse expression of cytokeratin AE1/AE3 immunostain, focally expresses Desmin and smooth muscle actin immunostains, and is negative for p63 and S100 immunostains. Although the immunophenotype is not specific for a urothelial primary, in lieu of further immunophenotyping, tumor will be reserved for ancillary tumor testing. If more definitive immunophenotyping is clinically indicated, please contact pathology. The case was discussed with Robynn Pane PA-C on 04/12/15. (CRR:gt, 04/12/15) [place] RUND DO Pathologist, Electronic Signature (Case signed 04/12/2015)  ASSESSMENT/PLAN:   Metastatic urothelial carcinoma ([place]) Metastatic urothelial carcinoma with urine cytology and us-guided biopsy of hepatic lesion confirming the  diagnosis.  Oncology history is developed.  We reviewed the patient's diagnosis, stage, prognosis, and treatment options.  He is educated that he has Stage IV disease, but he is a robust man and absolutely a candidate for systemic treatment.  We discussed Carboplatin/Gemcitabine in a day 1, 8 every 21 day fashion.  The goal of treatment is palliative to help with symptom control and an attempt at progression-free and overall survival.    Bone or liver involvement and poor performance status is most predictive of a poor response and survival.  Those meeting these parameters have a median survival of 4 months.  HOWEVER, this patient's performance status is an ECOG of 0-1.  Therefore, survival in this situation is much longer than 4 months.  As mentioned above, treatment with a response will help extend the patient's survival.  The patient was very clear that quality of life is more important to him than quantity; "but I do want to live as long as possible."  He raised some very real and reasonable concerns.  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  With treatment, he should be able to  continue his care he provides his wife as treatment is typically well tolerated.  He also has some money in a 401K.  He does not want to drain this account for his treatment only to still pass as a result of his malignancy.  We understand this concern as it is all-too-often a real concern for patients, unfortunately.    He does have Medicare Advantage.  We have asked Lendell Caprice to evaluate his benefits and provide guidance to the patient regarding his out-of-pocket costs.   If he pursues systemic chemotherapy (he is a good candidate for treatment), he will need a port-a-cath and chemotherapy teaching for Carboplatin/Gemcitabine.  The use of cisplatin-based therapy in this man is questionable and the benefit in the palliative setting does not outweigh the risks.  Based upon current  schedule, he will get a port placed by IR tomorrow, followed by initiaiton of chemotherapy on Wednesday, 04/14/2015 with chemotherapy teaching scheduled on 04/14/2015 as well.  Pre-chemo labs: CBC diff, CMET, Mg.  Due to his lytic osseous lesion, he is a candidate for Xgeva every 28 days.  He will be started on Ca++ and Vit D.  We will address initiation of Xgeva on Day 8 Cycle 1 or Day 1 of Cycle 2.  Let's get chemotherapy started and evaluate tolerance prior to adding another agent into his cancer care.  He is asymptomatic regarding his L4 lytic lesion.  Return as scheduled.     Metastatic urothelial carcinoma ([place])   04/02/2015 Imaging CT abd/pelvis- Innumberable hepatic masses, B/L adrenal mets, abdominopelvic lymphadenopathy, LLL lung lesion 6 mm, Lytic lesion of right L2 vertebral bosy, circumferential wall thickening of lower thoracic esophagus, mild diffuse bladder wall thickening.   04/02/2015 Pathology Results Urine Cytology- cells present suspicious for malignancy.  Suspicious for high-grade urothelial carcinoma.   04/08/2015 Procedure US guided biopsy of hepatic lesion   04/08/2015 Pathology Results Liver, needle/core biopsy, right - METASTATIC HIGH GRADE CARCINOMA,     All questions were answered. The patient knows to call the clinic with any problems, questions or concerns. We can certainly see the patient much sooner if necessary.  This note is electronically signed [id number] Cyril Mourning, MD  04/12/2015 5:49 PM

## 2015-04-13 ENCOUNTER — Other Ambulatory Visit (HOSPITAL_COMMUNITY): Payer: Self-pay | Admitting: Oncology

## 2015-04-13 ENCOUNTER — Encounter (HOSPITAL_COMMUNITY): Payer: Self-pay

## 2015-04-13 ENCOUNTER — Ambulatory Visit (HOSPITAL_COMMUNITY)
Admission: RE | Admit: 2015-04-13 | Discharge: 2015-04-13 | Disposition: A | Discharge status: Home / Self Care | Payer: Medicare HMO | Source: Ambulatory Visit | Attending: Oncology | Admitting: Oncology

## 2015-04-13 ENCOUNTER — Other Ambulatory Visit (HOSPITAL_COMMUNITY): Payer: Medicare HMO

## 2015-04-13 DIAGNOSIS — Z7982 Long term (current) use of aspirin: Secondary | ICD-10-CM | POA: Insufficient documentation

## 2015-04-13 DIAGNOSIS — C801 Malignant (primary) neoplasm, unspecified: Secondary | ICD-10-CM

## 2015-04-13 DIAGNOSIS — C689 Malignant neoplasm of urinary organ, unspecified: Secondary | ICD-10-CM | POA: Diagnosis present

## 2015-04-13 HISTORY — PX: PORTACATH PLACEMENT: SHX2246

## 2015-04-13 MED ORDER — SODIUM CHLORIDE 0.9 % IV SOLN
INTRAVENOUS | Status: DC
  Administered 2015-04-13: 09:00:00 via INTRAVENOUS

## 2015-04-13 MED ORDER — MIDAZOLAM HCL 2 MG/2ML IJ SOLN
INTRAMUSCULAR | Status: AC
  Filled 2015-04-13: qty 2

## 2015-04-13 MED ORDER — LIDOCAINE HCL 1 % IJ SOLN
INTRAMUSCULAR | Status: AC
  Filled 2015-04-13: qty 20

## 2015-04-13 MED ORDER — LIDOCAINE-EPINEPHRINE (PF) 1 %-1:200000 IJ SOLN
INTRAMUSCULAR | Status: AC
  Filled 2015-04-13: qty 30

## 2015-04-13 MED ORDER — CEFAZOLIN SODIUM-DEXTROSE 2-3 GM-% IV SOLR
2.0000 g | Freq: Once | INTRAVENOUS | Status: AC
  Administered 2015-04-13: 2 g via INTRAVENOUS

## 2015-04-13 MED ORDER — FENTANYL CITRATE (PF) 100 MCG/2ML IJ SOLN
INTRAMUSCULAR | Status: AC | PRN
  Administered 2015-04-13 (×2): 50 ug via INTRAVENOUS

## 2015-04-13 MED ORDER — FENTANYL CITRATE (PF) 100 MCG/2ML IJ SOLN
INTRAMUSCULAR | Status: AC
  Filled 2015-04-13: qty 2

## 2015-04-13 MED ORDER — CEFAZOLIN SODIUM-DEXTROSE 2-3 GM-% IV SOLR
INTRAVENOUS | Status: AC
  Filled 2015-04-13: qty 50

## 2015-04-13 MED ORDER — HEPARIN SOD (PORK) LOCK FLUSH 100 UNIT/ML IV SOLN
INTRAVENOUS | Status: AC
  Filled 2015-04-13: qty 5

## 2015-04-13 MED ORDER — MIDAZOLAM HCL 2 MG/2ML IJ SOLN
INTRAMUSCULAR | Status: AC | PRN
  Administered 2015-04-13 (×2): 1 mg via INTRAVENOUS

## 2015-04-13 NOTE — H&P (Signed)
HPI: Patient with metastatic urothelial carcinoma who has been seen by Robynn Pane PA-C and S/p liver lesion biopsy by IR on 04/08/2015. He is now scheduled today for image guided port a catheter placement.  The patient has had a H&P performed within the last 30 days, all history, medications, and exam have been reviewed. The patient denies any interval changes since the H&P.  Medications: Prior to Admission medications   Medication Sig Start Date End Date Taking? Authorizing Provider  aspirin 325 MG tablet Take 325 mg by mouth daily.    Historical Provider, MD  calcium carbonate (TUMS EX) 750 MG chewable tablet Chew 2 tablets by mouth 2 (two) times daily as needed for heartburn.    Historical Provider, MD  CARBOPLATIN IV Inject into the vein. Day 1 every 21 days    Historical Provider, MD  dexamethasone (DECADRON) 4 MG tablet Take 2 tablets twice a day x 3 days. Then take 1 tablet twice a day x 3 days. Then take 1 tablet daily x 3 days. 04/09/15   Patrici Ranks, MD  Gemcitabine HCl (GEMZAR IV) Inject into the vein. Days 1, 8 every 21 days    Historical Provider, MD  lidocaine-prilocaine (EMLA) cream Apply a quarter size amount to port site 1 hour prior to chemo. Do not rub in. Cover with plastic wrap. 04/12/15   Patrici Ranks, MD  metoprolol (LOPRESSOR) 100 MG tablet Take 100 mg by mouth at bedtime.  02/25/13   Historical Provider, MD  Multiple Vitamins-Minerals (PRESERVISION AREDS 2 PO) Take 1 tablet by mouth 2 (two) times daily.    Historical Provider, MD  omeprazole (PRILOSEC) 40 MG capsule Take 40 mg by mouth daily.    Historical Provider, MD  ondansetron (ZOFRAN) 8 MG tablet Take 1 tablet (8 mg total) by mouth every 8 (eight) hours as needed for nausea or vomiting. 04/12/15   Patrici Ranks, MD  oxyCODONE (OXY IR/ROXICODONE) 5 MG immediate release tablet Take 1-2 tablets (5-10 mg total) by mouth every 4 (four) hours as needed for severe pain. 04/05/15   Baird Cancer, PA-C    Probiotic Product (ALIGN PO) Take 1 tablet by mouth daily.    Historical Provider, MD  prochlorperazine (COMPAZINE) 10 MG tablet Take 1 tablet (10 mg total) by mouth every 6 (six) hours as needed for nausea or vomiting. 04/12/15   Patrici Ranks, MD  ranitidine (ZANTAC) 150 MG capsule Take 150 mg by mouth 2 (two) times daily.    Historical Provider, MD     Vital Signs: BP 174/101 mmHg  Pulse 63  Temp(Src) 97.6 F (36.4 C) (Oral)  Resp 18  Ht 6' 3.5" (1.918 m)  Wt 268 lb (121.564 kg)  BMI 33.05 kg/m2  SpO2 98%  Physical Exam  Constitutional: He is oriented to person, place, and time. No distress.  HENT:  Head: Normocephalic and atraumatic.  Neck: No tracheal deviation present.  Cardiovascular: Normal rate and regular rhythm.  Exam reveals no gallop and no friction rub.   No murmur heard. Pulmonary/Chest: Effort normal and breath sounds normal. No respiratory distress. He has no wheezes. He has no rales.  Abdominal: Soft. Bowel sounds are normal. He exhibits no distension. There is no tenderness.  Neurological: He is alert and oriented to person, place, and time.  Skin: Skin is warm and dry. He is not diaphoretic.  Psychiatric: He has a normal mood and affect. His behavior is normal. Thought content normal.    Mallampati  Score:  MD Evaluation Airway: WNL Heart: WNL Abdomen: WNL Chest/ Lungs: WNL ASA  Classification: 3 Mallampati/Airway Score: Two  Labs:  CBC:  Recent Labs  04/08/15 1137  WBC 8.2  HGB 13.3  HCT 39.8  PLT 258    COAGS:  Recent Labs  04/08/15 1137  INR 1.19  APTT 33    BMP:  Recent Labs  04/02/15 1208  CREATININE 1.40*    Assessment/Plan:  Metastatic Urothelial Carcinoma Seen by Robynn Pane PA-C S/p liver lesion biopsy by IR 04/08/2015 Scheduled today for image guided port a catheter placement with sedation The patient has been NPO, no blood thinners taken, labs and vitals have been reviewed. Risks and Benefits discussed  with the patient including, but not limited to bleeding, infection, pneumothorax, or fibrin sheath development and need for additional procedures. All of the patient's questions were answered, patient is agreeable to proceed. Consent signed and in chart.    SignedHedy Jacob 04/13/2015, 9:12 AM

## 2015-04-13 NOTE — Procedures (Signed)
Interventional Radiology Procedure Note  Procedure: Placement of a right IJ approach single lumen PowerPort.  Tip is positioned at the superior cavoatrial junction and catheter is ready for immediate use.  Complications: No immediate Recommendations:  - Ok to shower tomorrow - Do not submerge for 7 days - Routine line care   Signed,  Heath K. McCullough, MD   

## 2015-04-13 NOTE — Discharge Instructions (Addendum)

## 2015-04-14 ENCOUNTER — Other Ambulatory Visit (HOSPITAL_COMMUNITY): Payer: Self-pay | Admitting: *Deleted

## 2015-04-14 ENCOUNTER — Encounter (HOSPITAL_COMMUNITY): Payer: Self-pay

## 2015-04-14 ENCOUNTER — Encounter (HOSPITAL_COMMUNITY): Payer: Medicare HMO

## 2015-04-14 ENCOUNTER — Encounter (HOSPITAL_BASED_OUTPATIENT_CLINIC_OR_DEPARTMENT_OTHER): Payer: Medicare HMO

## 2015-04-14 VITALS — BP 147/76 | HR 61 | Temp 97.7°F | Resp 20 | Wt 266.4 lb

## 2015-04-14 DIAGNOSIS — C7910 Secondary malignant neoplasm of unspecified urinary organs: Secondary | ICD-10-CM

## 2015-04-14 DIAGNOSIS — C801 Malignant (primary) neoplasm, unspecified: Secondary | ICD-10-CM | POA: Diagnosis not present

## 2015-04-14 DIAGNOSIS — Z5111 Encounter for antineoplastic chemotherapy: Secondary | ICD-10-CM | POA: Diagnosis not present

## 2015-04-14 LAB — COMPREHENSIVE METABOLIC PANEL
ALK PHOS: 141 U/L — AB (ref 38–126)
ALT: 76 U/L — AB (ref 17–63)
AST: 70 U/L — ABNORMAL HIGH (ref 15–41)
Albumin: 3.1 g/dL — ABNORMAL LOW (ref 3.5–5.0)
Anion gap: 7 (ref 5–15)
BILIRUBIN TOTAL: 0.8 mg/dL (ref 0.3–1.2)
BUN: 31 mg/dL — ABNORMAL HIGH (ref 6–20)
CALCIUM: 8.7 mg/dL — AB (ref 8.9–10.3)
CO2: 26 mmol/L (ref 22–32)
CREATININE: 1.28 mg/dL — AB (ref 0.61–1.24)
Chloride: 103 mmol/L (ref 101–111)
GFR, EST NON AFRICAN AMERICAN: 54 mL/min — AB (ref >60)
Glucose, Bld: 102 mg/dL — ABNORMAL HIGH (ref 65–99)
Potassium: 4.1 mmol/L (ref 3.5–5.1)
Sodium: 136 mmol/L (ref 135–145)
TOTAL PROTEIN: 6.8 g/dL (ref 6.5–8.1)

## 2015-04-14 LAB — CBC WITH DIFFERENTIAL/PLATELET
Basophils Absolute: 0 10*3/uL (ref 0.0–0.1)
Basophils Relative: 0 %
Eosinophils Absolute: 0 10*3/uL (ref 0.0–0.7)
Eosinophils Relative: 0 %
HEMATOCRIT: 41.7 % (ref 39.0–52.0)
HEMOGLOBIN: 14.1 g/dL (ref 13.0–17.0)
LYMPHS ABS: 1.5 10*3/uL (ref 0.7–4.0)
LYMPHS PCT: 12 %
MCH: 31.1 pg (ref 26.0–34.0)
MCHC: 33.8 g/dL (ref 30.0–36.0)
MCV: 91.9 fL (ref 78.0–100.0)
MONOS PCT: 13 %
Monocytes Absolute: 1.5 10*3/uL — ABNORMAL HIGH (ref 0.1–1.0)
NEUTROS PCT: 75 %
Neutro Abs: 9 10*3/uL — ABNORMAL HIGH (ref 1.7–7.7)
Platelets: 291 10*3/uL (ref 150–400)
RBC: 4.54 MIL/uL (ref 4.22–5.81)
RDW: 13.1 % (ref 11.5–15.5)
WBC: 12 10*3/uL — AB (ref 4.0–10.5)

## 2015-04-14 MED ORDER — PALONOSETRON HCL INJECTION 0.25 MG/5ML
0.2500 mg | Freq: Once | INTRAVENOUS | Status: AC
  Administered 2015-04-14: 0.25 mg via INTRAVENOUS

## 2015-04-14 MED ORDER — SODIUM CHLORIDE 0.9 % IV SOLN
Freq: Once | INTRAVENOUS | Status: AC
  Administered 2015-04-14: 10:00:00 via INTRAVENOUS

## 2015-04-14 MED ORDER — SODIUM CHLORIDE 0.9 % IJ SOLN
10.0000 mL | INTRAMUSCULAR | Status: DC | PRN
  Administered 2015-04-14: 10 mL
  Filled 2015-04-14: qty 10

## 2015-04-14 MED ORDER — PALONOSETRON HCL INJECTION 0.25 MG/5ML
INTRAVENOUS | Status: AC
  Filled 2015-04-14: qty 5

## 2015-04-14 MED ORDER — SODIUM CHLORIDE 0.9 % IV SOLN
573.5000 mg | Freq: Once | INTRAVENOUS | Status: AC
  Administered 2015-04-14: 570 mg via INTRAVENOUS
  Filled 2015-04-14: qty 57

## 2015-04-14 MED ORDER — SODIUM CHLORIDE 0.9 % IV SOLN
750.0000 mg/m2 | Freq: Once | INTRAVENOUS | Status: AC
  Administered 2015-04-14: 1900 mg via INTRAVENOUS
  Filled 2015-04-14: qty 49.97

## 2015-04-14 MED ORDER — SODIUM CHLORIDE 0.9 % IV SOLN
10.0000 mg | Freq: Once | INTRAVENOUS | Status: AC
  Administered 2015-04-14: 10 mg via INTRAVENOUS
  Filled 2015-04-14: qty 1

## 2015-04-14 MED ORDER — HEPARIN SOD (PORK) LOCK FLUSH 100 UNIT/ML IV SOLN
500.0000 [IU] | Freq: Once | INTRAVENOUS | Status: AC | PRN
  Administered 2015-04-14: 500 [IU]

## 2015-04-14 MED ORDER — HEPARIN SOD (PORK) LOCK FLUSH 100 UNIT/ML IV SOLN
INTRAVENOUS | Status: AC
  Filled 2015-04-14: qty 5

## 2015-04-14 NOTE — Progress Notes (Signed)
Tolerated chemo well. 

## 2015-04-19 ENCOUNTER — Telehealth (HOSPITAL_COMMUNITY): Payer: Self-pay | Admitting: *Deleted

## 2015-04-19 NOTE — Telephone Encounter (Signed)
24h call back (delayed due to Thanksgiving holiday): Patient noticed a rash around port area on Sunday when he took a shower. Patient states that the rash covers him where they cleaned him with the surgical prep prior to putting port in. The area is not any worse and not any better since noticing it on Sunday night. Rash is uncomfortable but no draining areas and it is not painful. Rash is itching. Hydrocortisone cream over-the-counter to be applied three times a day to rash area. He said ok and that he had hydrocortisone at home. Patient reports that he had some nausea on Sunday pm. He took a Zofran and felt better. He said within 20 minutes he was feeling better. Does state that he feels tired. Patient to call me if his rash is not better. Patient returns to [place] on 11/30 for Day 8 chemo.

## 2015-04-21 ENCOUNTER — Encounter (HOSPITAL_BASED_OUTPATIENT_CLINIC_OR_DEPARTMENT_OTHER): Payer: Medicare HMO

## 2015-04-21 DIAGNOSIS — Z5111 Encounter for antineoplastic chemotherapy: Secondary | ICD-10-CM

## 2015-04-21 DIAGNOSIS — C801 Malignant (primary) neoplasm, unspecified: Secondary | ICD-10-CM

## 2015-04-21 DIAGNOSIS — C7910 Secondary malignant neoplasm of unspecified urinary organs: Secondary | ICD-10-CM

## 2015-04-21 DIAGNOSIS — Z5189 Encounter for other specified aftercare: Secondary | ICD-10-CM | POA: Diagnosis not present

## 2015-04-21 LAB — CBC WITH DIFFERENTIAL/PLATELET
Basophils Absolute: 0 10*3/uL (ref 0.0–0.1)
Basophils Relative: 0 %
EOS ABS: 0.1 10*3/uL (ref 0.0–0.7)
EOS PCT: 4 %
HCT: 36.9 % — ABNORMAL LOW (ref 39.0–52.0)
Hemoglobin: 12.4 g/dL — ABNORMAL LOW (ref 13.0–17.0)
LYMPHS ABS: 1.3 10*3/uL (ref 0.7–4.0)
Lymphocytes Relative: 34 %
MCH: 30.8 pg (ref 26.0–34.0)
MCHC: 33.6 g/dL (ref 30.0–36.0)
MCV: 91.6 fL (ref 78.0–100.0)
MONO ABS: 0.3 10*3/uL (ref 0.1–1.0)
MONOS PCT: 8 %
Neutro Abs: 2 10*3/uL (ref 1.7–7.7)
Neutrophils Relative %: 54 %
PLATELETS: 156 10*3/uL (ref 150–400)
RBC: 4.03 MIL/uL — ABNORMAL LOW (ref 4.22–5.81)
RDW: 12.7 % (ref 11.5–15.5)
WBC: 3.8 10*3/uL — AB (ref 4.0–10.5)

## 2015-04-21 MED ORDER — PEGFILGRASTIM 6 MG/0.6ML ~~LOC~~ PSKT
PREFILLED_SYRINGE | SUBCUTANEOUS | Status: AC
  Filled 2015-04-21: qty 0.6

## 2015-04-21 MED ORDER — SODIUM CHLORIDE 0.9 % IV SOLN
Freq: Once | INTRAVENOUS | Status: AC
  Administered 2015-04-21: 10:00:00 via INTRAVENOUS

## 2015-04-21 MED ORDER — SODIUM CHLORIDE 0.9 % IV SOLN: 8.0000 mg | Freq: Once | INTRAVENOUS | Status: DC

## 2015-04-21 MED ORDER — SODIUM CHLORIDE 0.9 % IV SOLN
750.0000 mg/m2 | Freq: Once | INTRAVENOUS | Status: AC
  Administered 2015-04-21: 1900 mg via INTRAVENOUS
  Filled 2015-04-21: qty 49.97

## 2015-04-21 MED ORDER — SODIUM CHLORIDE 0.9 % IV SOLN: 10.0000 mg | Freq: Once | INTRAVENOUS | Status: DC

## 2015-04-21 MED ORDER — PEGFILGRASTIM 6 MG/0.6ML ~~LOC~~ PSKT
6.0000 mg | PREFILLED_SYRINGE | Freq: Once | SUBCUTANEOUS | Status: AC
  Administered 2015-04-21: 6 mg via SUBCUTANEOUS

## 2015-04-21 MED ORDER — SODIUM CHLORIDE 0.9 % IJ SOLN: 10.0000 mL | INTRAMUSCULAR | Status: DC | PRN

## 2015-04-21 MED ORDER — HEPARIN SOD (PORK) LOCK FLUSH 100 UNIT/ML IV SOLN
500.0000 [IU] | Freq: Once | INTRAVENOUS | Status: AC | PRN
  Administered 2015-04-21: 500 [IU]
  Filled 2015-04-21: qty 5

## 2015-04-21 MED ORDER — SODIUM CHLORIDE 0.9 % IV SOLN
Freq: Once | INTRAVENOUS | Status: AC
  Administered 2015-04-21: 8 mg via INTRAVENOUS
  Filled 2015-04-21: qty 4

## 2015-04-21 MED ORDER — PROCHLORPERAZINE MALEATE 10 MG PO TABS
10.0000 mg | ORAL_TABLET | Freq: Once | ORAL | Status: AC
  Administered 2015-04-21: 10 mg via ORAL
  Filled 2015-04-21: qty 1

## 2015-04-21 NOTE — Patient Instructions (Signed)
[facility] Discharge Instructions for Patients Receiving Chemotherapy  Today you received the following chemotherapy agent: Gemzar. You also were given the Neulasta OnPro delivery device to your left arm, please remove it tomorrow at 5pm. Call us with any questions or concerns.     If you develop nausea and vomiting, or diarrhea that is not controlled by your medication, call the clinic.  The clinic phone number is [phone]. Office hours are Monday-Friday 8:30am-5:00pm.  BELOW ARE SYMPTOMS THAT SHOULD BE REPORTED IMMEDIATELY:  *FEVER GREATER THAN 101.0 F  *CHILLS WITH OR WITHOUT FEVER  NAUSEA AND VOMITING THAT IS NOT CONTROLLED WITH YOUR NAUSEA MEDICATION  *UNUSUAL SHORTNESS OF BREATH  *UNUSUAL BRUISING OR BLEEDING  TENDERNESS IN MOUTH AND THROAT WITH OR WITHOUT PRESENCE OF ULCERS  *URINARY PROBLEMS  *BOWEL PROBLEMS  UNUSUAL RASH Items with * indicate a potential emergency and should be followed up as soon as possible. If you have an emergency after office hours please contact your primary care physician or go to the nearest emergency department.  Please call the clinic during office hours if you have any questions or concerns.   You may also contact the Patient Navigator at [phone] should you have any questions or need assistance in obtaining follow up care.

## 2015-04-21 NOTE — Progress Notes (Signed)
Patient tolerated infusion well.  VSS post infusion.  Education given to the patient regarding the Neulasta OnPro as well as a time when he could remove the device with instructions.  OnPro placed on left arm.

## 2015-04-23 ENCOUNTER — Encounter (HOSPITAL_COMMUNITY): Payer: Medicare HMO | Attending: Oncology | Admitting: Oncology

## 2015-04-23 ENCOUNTER — Encounter (HOSPITAL_COMMUNITY): Payer: Self-pay | Admitting: Oncology

## 2015-04-23 VITALS — BP 123/82 | HR 83 | Temp 97.0°F | Resp 22 | Wt 259.2 lb

## 2015-04-23 DIAGNOSIS — R5383 Other fatigue: Secondary | ICD-10-CM

## 2015-04-23 DIAGNOSIS — C649 Malignant neoplasm of unspecified kidney, except renal pelvis: Secondary | ICD-10-CM

## 2015-04-23 DIAGNOSIS — C7971 Secondary malignant neoplasm of right adrenal gland: Secondary | ICD-10-CM

## 2015-04-23 DIAGNOSIS — C7972 Secondary malignant neoplasm of left adrenal gland: Secondary | ICD-10-CM

## 2015-04-23 DIAGNOSIS — C7910 Secondary malignant neoplasm of unspecified urinary organs: Secondary | ICD-10-CM

## 2015-04-23 DIAGNOSIS — C787 Secondary malignant neoplasm of liver and intrahepatic bile duct: Secondary | ICD-10-CM | POA: Diagnosis not present

## 2015-04-23 DIAGNOSIS — R112 Nausea with vomiting, unspecified: Secondary | ICD-10-CM

## 2015-04-23 NOTE — Assessment & Plan Note (Addendum)
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  Oncology history is updated.  S/P cycle 1 of treatment.    Today is a nadir check.  No role for labs today.  CBC performed 2 days ago.  He notes some fatigue.  "I'm used to going-and-going."  He notes that following day 1 of cycle 1, he felt fatigued, but this improved until day 8 of cycle 1.  He his now on the downward swing towards fatigue.  He is hopeful that he recovers in this regard prior to his next cycle.  His fatigue has not hindered his ability to do the things at home he wishes, he just notes a little more fatigue.  He notes 2 episodes of nausea which were aborted at home each time with home anti-emetics.  He notes a sour taste in his mouth.  He is on Prilosec currently and I will increase to 40 mg BID for the time being to see if that helps.  He is on a few medications that can insult the stomach.  Return as scheduled for follow-up and subsequent treatment.

## 2015-04-23 NOTE — Patient Instructions (Signed)
[place] at [name] Discharge Instructions  RECOMMENDATIONS MADE BY THE CONSULTANT AND ANY TEST RESULTS WILL BE SENT TO YOUR REFERRING PHYSICIAN.  Exam and discussion by Robynn Pane, PA-C Increase your Prilosec to 40 mg twice daily Continue your nausea meds Call with concerns  Follow-up in 2 weeks as scheduled.  Thank you for choosing [place] at [facility] to provide your oncology and hematology care.  To afford each patient quality time with our provider, please arrive at least 15 minutes before your scheduled appointment time.    You need to re-schedule your appointment should you arrive 10 or more minutes late.  We strive to give you quality time with our providers, and arriving late affects you and other patients whose appointments are after yours.  Also, if you no show three or more times for appointments you may be dismissed from the clinic at the providers discretion.     Again, thank you for choosing [facility].  Our hope is that these requests will decrease the amount of time that you wait before being seen by our physicians.       _____________________________________________________________  Should you have questions after your visit to [facility], please contact our office at [phone] between the hours of 8:30 a.m. and 4:30 p.m.  Voicemails left after 4:30 p.m. will not be returned until the following business day.  For prescription refill requests, have your pharmacy contact our office.

## 2015-04-23 NOTE — Progress Notes (Signed)
[name], MD [address] [place] [place] [address]  Metastatic urothelial carcinoma [facility])  CURRENT THERAPY: Carboplatin/Gemcitabine in a day 1, 8 every 21 day fashion  INTERVAL HISTORY: Harry Franklin 74 y.o. 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    Metastatic urothelial carcinoma ([place])   04/02/2015 Imaging CT abd/pelvis- Innumberable hepatic masses, B/L adrenal mets, abdominopelvic lymphadenopathy, LLL lung lesion 6 mm, Lytic lesion of right L2 vertebral bosy, circumferential wall thickening of lower thoracic esophagus, mild diffuse bladder wall thickening.   04/02/2015 Pathology Results Urine Cytology- cells present suspicious for malignancy.  Suspicious for high-grade urothelial carcinoma.   04/08/2015 Procedure US guided biopsy of hepatic lesion   04/08/2015 Pathology Results Liver, needle/core biopsy, right - METASTATIC HIGH GRADE CARCINOMA,   04/14/2015 -  Chemotherapy Carboplatin/Gemcitabine Days 1, 8 every 21 days    I personally reviewed and went over laboratory results with the patient.  The results are noted within this dictation.  He tolerated his first cycle relatively well.  He notes some chemotherapy-induced fatigue with improvement prior to day 8 of cycle 1.  Now with more fatigue following treatment the other day.    He notes 2 episodes of nausea that are aborted with Zofran at home.   No vomiting.    Past Medical History  Diagnosis Date  . GERD (gastroesophageal reflux disease)   . Hypokalemia   . Hypertension   . Cataract   . Arthritis   . Macular degeneration, age related   . DVT (deep venous thrombosis) ([place])     developed after traveling x 1, developed another after left foot surgery  . Hydrocele in adult   . Metastatic urothelial carcinoma ([place]) 04/12/2015    has Dysphagia; Encounter for screening  colonoscopy; Dysphagia, pharyngoesophageal phase; Reflux esophagitis; Peptic stricture of esophagus; Hiatal hernia; Abdominal pain; and Metastatic urothelial carcinoma ([place]) on his problem list.     is allergic to rabeprazole and vancomycin.  Current Outpatient Prescriptions on File Prior to Visit  Medication Sig Dispense Refill  . aspirin 325 MG tablet Take 325 mg by mouth daily.    . calcium carbonate (TUMS EX) 750 MG chewable tablet Chew 2 tablets by mouth 2 (two) times daily as needed for heartburn.    Marland Kitchen CARBOPLATIN IV Inject into the vein. Day 1 every 21 days    . dexamethasone (DECADRON) 4 MG tablet Take 2 tablets twice a day x 3 days. Then take 1 tablet twice a day x 3 days. Then take 1 tablet daily x 3 days. 30 tablet 0  . Gemcitabine HCl (GEMZAR IV) Inject into the vein. Days 1, 8 every 21 days    . metoprolol (LOPRESSOR) 100 MG tablet Take 100 mg by mouth at bedtime.     . Multiple Vitamins-Minerals (PRESERVISION AREDS 2 PO) Take 1 tablet by mouth 2 (two) times daily.    Marland Kitchen omeprazole (PRILOSEC) 40 MG capsule Take 40 mg by mouth daily.    . ondansetron (ZOFRAN) 8 MG tablet Take 1 tablet (8 mg total) by mouth every 8 (eight) hours as needed for nausea or vomiting. 30 tablet 2  . Probiotic Product (ALIGN PO) Take 1 tablet by mouth daily.    . ranitidine (ZANTAC) 150 MG capsule Take 150 mg by mouth 2 (two) times daily.    Marland Kitchen lidocaine-prilocaine (EMLA) cream Apply a quarter size amount  to port site 1 hour prior to chemo. Do not rub in. Cover with plastic wrap. (Patient not taking: Reported on 04/23/2015) 30 g 3  . oxyCODONE (OXY IR/ROXICODONE) 5 MG immediate release tablet Take 1-2 tablets (5-10 mg total) by mouth every 4 (four) hours as needed for severe pain. (Patient not taking: Reported on 04/23/2015) 60 tablet 0  . prochlorperazine (COMPAZINE) 10 MG tablet Take 1 tablet (10 mg total) by mouth every 6 (six) hours as needed for nausea or vomiting. (Patient not taking: Reported on 04/23/2015)  30 tablet 2   No current facility-administered medications on file prior to visit.    Past Surgical History  Procedure Laterality Date  . Replacement total knee    . Appendectomy    . Hammer toe surgery Left   . Lapidus procedure    . Hallux fusion    . Colonoscopy  03/31/05  . Colonoscopy N/A 10/02/2014    [id number] diverticulosis  . Esophagogastroduodenoscopy N/A 10/02/2014    [id number] ulcerative reflux/s/p dilation/large HH  . Maloney dilation N/A 10/02/2014    Procedure: Venia Minks DILATION;  Surgeon: Daneil Dolin, MD;  [facility];  Service: Endoscopy;  Laterality: N/A;  . Esophagogastroduodenoscopy N/A 01/29/2015    Procedure: ESOPHAGOGASTRODUODENOSCOPY (EGD);  Surgeon: Daneil Dolin, MD;  Location: [facility];  Service: Endoscopy;  Laterality: N/A;  0800-rescheduled to 9/9 @ 915 [place] notified pt  . Esophageal dilation  01/29/2015    Procedure: ESOPHAGEAL DILATION;  Surgeon: Daneil Dolin, MD;  Location: [facility];  Service: Endoscopy;;  . Portacath placement Left 04/13/15    Denies any headaches, dizziness, double vision, fevers, chills, night sweats, vomiting, diarrhea, chest pain, heart palpitations, shortness of breath, blood in stool, black tarry stool, urinary pain, urinary burning, urinary frequency, hematuria.   PHYSICAL EXAMINATION  ECOG PERFORMANCE STATUS: 1 - Symptomatic but completely ambulatory  Filed Vitals:   04/23/15 0900  BP: 123/82  Pulse: 83  Temp: 97 F (36.1 C)  Resp: 22    GENERAL:alert, no distress, well nourished, well developed, comfortable, cooperative and unaccompanied today. SKIN: skin color, texture, turgor are normal, no rashes or significant lesions HEAD: Normocephalic, No masses, lesions, tenderness or abnormalities EYES: normal, PERRLA, EOMI, Conjunctiva are pink and non-injected EARS: External ears normal OROPHARYNX:lips, buccal mucosa, and tongue normal and mucous membranes are moist  NECK: supple, trachea  midline LYMPH:  not examined BREAST:not examined LUNGS: clear to auscultation  HEART: regular rate & rhythm, no murmurs and no gallops ABDOMEN:abdomen soft, non-tender and normal bowel sounds BACK: Back symmetric, no curvature., No CVA tenderness EXTREMITIES:less then 2 second capillary refill, no joint deformities, effusion, or inflammation, no edema, no skin discoloration, no clubbing, no cyanosis  NEURO: alert & oriented x 3 with fluent speech, no focal motor/sensory deficits, gait normal   LABORATORY DATA: CBC    Component Value Date/Time   WBC 3.8* 04/21/2015 1003   RBC 4.03* 04/21/2015 1003   HGB 12.4* 04/21/2015 1003   HCT 36.9* 04/21/2015 1003   PLT 156 04/21/2015 1003   MCV 91.6 04/21/2015 1003   MCH 30.8 04/21/2015 1003   MCHC 33.6 04/21/2015 1003   RDW 12.7 04/21/2015 1003   LYMPHSABS 1.3 04/21/2015 1003   MONOABS 0.3 04/21/2015 1003   EOSABS 0.1 04/21/2015 1003   BASOSABS 0.0 04/21/2015 1003      Chemistry      Component Value Date/Time   NA 136 04/14/2015 0909   K 4.1 04/14/2015 0909  CL 103 04/14/2015 0909   CO2 26 04/14/2015 0909   BUN 31* 04/14/2015 0909   CREATININE 1.28* 04/14/2015 0909      Component Value Date/Time   CALCIUM 8.7* 04/14/2015 0909   ALKPHOS 141* 04/14/2015 0909   AST 70* 04/14/2015 0909   ALT 76* 04/14/2015 0909   BILITOT 0.8 04/14/2015 0909        PENDING LABS:   RADIOGRAPHIC STUDIES:  Ct Abdomen Pelvis W Wo Contrast  04/02/2015  CLINICAL DATA:  Asymptomatic microscopic hematuria.  Appendectomy. EXAM: CT ABDOMEN AND PELVIS WITHOUT AND WITH CONTRAST TECHNIQUE: Multidetector CT imaging of the abdomen and pelvis was performed following the standard protocol before and following the bolus administration of intravenous contrast. CONTRAST:  175mL OMNIPAQUE IOHEXOL 300 MG/ML  SOLN COMPARISON:  None. FINDINGS: Lower chest: Basilar left lower lobe 6 mm solid pulmonary nodule (series 11/image 13). Left anterior descending, left  circumflex and right coronary atherosclerosis. Patulous lower thoracic esophagus containing fluid and demonstrating circumferential mild esophageal wall thickening. Mildly enlarged 1.0 cm right pericardial phrenic node (series 2/ image 11). Hepatobiliary: There is a hypoenhancing 3.2 x 2.4 cm segment 4B left liver lobe mass (series 3/ image 31). There is a hypoenhancing 2.4 x 1.8 cm segment 8 right liver lobe mass (3/13). There are innumerable additional smaller hypodense lesions scattered throughout the liver (greater than 20), most of which are subcentimeter in size. Normal gallbladder with no radiopaque cholelithiasis. No biliary ductal dilatation. Pancreas: Fatty infiltration of the otherwise normal appearing pancreas. No main pancreatic duct dilation. No pancreatic mass. Spleen: Normal size. No mass. Adrenals/Urinary Tract: There are bilateral solid adrenal masses measuring 4.2 cm on the right and 5.2 cm on the left. No hydronephrosis. No nephrolithiasis. No renal cortical masses. Normal caliber ureters, with no ureteral stones. On delayed imaging, there is no urothelial wall thickening and there are no filling defects in the opacified portions of the bilateral collecting systems or ureters, noting limited evaluation of the non-opacified midportion of the right ureter. Mild diffuse bladder wall thickening. Small 1.2 x 1.1 cm right posterior bladder wall diverticulum. No focal bladder lesion. No bladder stones. Stomach/Bowel: Grossly normal stomach. Normal caliber small bowel with no small bowel wall thickening. The appendix is not discretely visualized. Mild sigmoid diverticulosis. No large bowel wall thickening or pericolonic fat stranding. Normal large bowel with no diverticulosis, large bowel wall thickening or pericolonic fat stranding. Vascular/Lymphatic: Atherosclerotic nonaneurysmal abdominal aorta. Patent portal, splenic, hepatic and renal veins. There is extensive retroperitoneal adenopathy in the  gastrohepatic ligament, porta hepatis, portacaval, aortocaval and left para-aortic chains. For example, there is a bulky 3.6 cm portacaval node (series 3/ image 31). There is a moderately enlarged 1.7 cm posterior paracaval node (3/30). There is a moderately enlarged 2.0 cm left para-aortic node (3/39). There is mild-to-moderate bilateral common and external iliac and mild bilateral inguinal lymphadenopathy. For example, there is a mildly-to-moderately enlarged 1.6 cm right external iliac node (3/79 and there is a mildly enlarged 1.1 cm left common iliac node (3/64). Reproductive: Top-normal size prostate. Other: No pneumoperitoneum. Trace pelvic ascites and diffuse mesenteric edema. No focal fluid collection. Musculoskeletal: Destructive lytic lesion in the right L2 vertebral body. Marked degenerative changes in the visualized thoracolumbar spine. Small fat containing left inguinal hernia. IMPRESSION: 1. 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 2. 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 3. Extensive abdominopelvic lymphadenopathy, likely nodal metastases. 4. Left lower lobe 6 mm solid pulmonary nodule, suspicious for a pulmonary metastasis. 5.  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 6. Circumferential wall thickening in the lower thoracic esophagus with associated paraesophageal lymphadenopathy, nonspecific, but suspicious for lower thoracic esophageal neoplasm. Recommend correlation with upper endoscopy. 7. No urolithiasis. No hydronephrosis. No renal cortical masses. No urothelial lesions, with limitations as described. 8. Mild diffuse bladder wall thickening with small bladder diverticulum, suggesting chronic bladder outlet obstruction. Recommend correlation with urinalysis. 9. Three-vessel coronary atherosclerosis. These results will be called to the ordering clinician or representative by the Radiologist  Assistant, and communication documented in the PACS or zVision Dashboard. Electronically Signed   By: Ilona Sorrel M.D.   On: 04/02/2015 13:26   US Biopsy  04/08/2015  CLINICAL DATA:  Numerous hepatic masses concerning for liver metastases. No known primary. EXAM: ULTRASOUND GUIDED CORE BIOPSY OF ANTERIOR RIGHT LIVER MASS MEDICATIONS: 1.0 mg IV Versed; 50 mcg IV Fentanyl Total Moderate Sedation Time: 6 PROCEDURE: The procedure, risks, benefits, and alternatives were explained to the patient. Questions regarding the procedure were encouraged and answered. The patient understands and consents to the procedure. The right upper quadrant was prepped with ChloraPrep in a sterile fashion, and a sterile drape was applied covering the operative field. A sterile gown and sterile gloves were used for the procedure. Local anesthesia was provided with 1% Lidocaine. Previous imaging reviewed. Patient positioned supine. Noncontrast localization CT performed. A hypoechoic heterogeneous mass in the anterior right hepatic lobe adjacent to the gallbladder was localized. This was correlated with the CT. Under sterile conditions and local anesthesia, a 17 gauge 6.8 cm access needle was advanced percutaneously to the lesion. Needle position confirmed with ultrasound. 18 gauge core biopsies obtained. Samples placed in formalin. No immediate complication. Patient tolerated the biopsy well. COMPLICATIONS: None. FINDINGS: Imaging confirms needle placement in the anterior right hepatic mass for core biopsy IMPRESSION: Successful ultrasound right hepatic mass 18 gauge core biopsy Electronically Signed   By: Jerilynn Mages.  Shick M.D.   On: 04/08/2015 13:43   Ir Fluoro Guide Cv Line Right  04/14/2015  CLINICAL DATA:  74 year old male with urothelial carcinoma in need of durable central venous access for chemotherapy. EXAM: IR RIGHT FLOURO GUIDE CV LINE; IR ULTRASOUND GUIDANCE VASC ACCESS RIGHT Date: 04/14/2015 ANESTHESIA/SEDATION: Moderate  (conscious) sedation was administered during this procedure. A total of 2 mg Versed and 100 mg Fentanyl were administered intravenously. The patient's vital signs were monitored continuously by radiology nursing throughout the course of the procedure. Total sedation time: 20 minutes FLUOROSCOPY TIME:  24 seconds for a total of 14.6 mGy TECHNIQUE: The right neck and chest was prepped with chlorhexidine, and draped in the usual sterile fashion using maximum barrier technique (cap and mask, sterile gown, sterile gloves, large sterile sheet, hand hygiene and cutaneous antiseptic). Antibiotic prophylaxis was provided with 2g Ancef administered IV one hour prior to skin incision. Local anesthesia was attained by infiltration with 1% lidocaine with epinephrine. Ultrasound demonstrated patency of the right internal jugular vein, and this was documented with an image. Under real-time ultrasound guidance, this vein was accessed with a 21 gauge micropuncture needle and image documentation was performed. A small dermatotomy was made at the access site with an 11 scalpel. A 0.018" wire was advanced into the SVC and the access needle exchanged for a 53F micropuncture vascular sheath. The 0.018" wire was then removed and a 0.035" wire advanced into the IVC. An appropriate location for the subcutaneous reservoir was selected below the clavicle and an incision  was made through the skin and underlying soft tissues. The subcutaneous tissues were then dissected using a combination of blunt and sharp surgical technique and a pocket was formed. A single lumen power injectable portacatheter was then tunneled through the subcutaneous tissues from the pocket to the dermatotomy and the port reservoir placed within the subcutaneous pocket. The venous access site was then serially dilated and a peel away vascular sheath placed over the wire. The wire was removed and the port catheter advanced into position under fluoroscopic guidance. The  catheter tip is positioned in the upper right atrium. This was documented with a spot image. The portacatheter was then tested and found to flush and aspirate well. The port was flushed with saline followed by 100 units/mL heparinized saline. The pocket was then closed in two layers using first subdermal inverted interrupted absorbable sutures followed by a running subcuticular suture. The epidermis was then sealed with Dermabond. The dermatotomy at the venous access site was also sealed with Dermabond. COMPLICATIONS: None.  The patient tolerated the procedure well. IMPRESSION: Successful placement of a right IJ approach Power Port with ultrasound and fluoroscopic guidance. The catheter is ready for use. Signed, Criselda Peaches, MD [facility] Radiology Electronically Signed   By: Jacqulynn Cadet M.D.   On: 04/14/2015 11:48   Ir US Guide Vasc Access Right  04/14/2015  CLINICAL DATA:  74 year old male with urothelial carcinoma in need of durable central venous access for chemotherapy. EXAM: IR RIGHT FLOURO GUIDE CV LINE; IR ULTRASOUND GUIDANCE VASC ACCESS RIGHT Date: 04/14/2015 ANESTHESIA/SEDATION: Moderate (conscious) sedation was administered during this procedure. A total of 2 mg Versed and 100 mg Fentanyl were administered intravenously. The patient's vital signs were monitored continuously by radiology nursing throughout the course of the procedure. Total sedation time: 20 minutes FLUOROSCOPY TIME:  24 seconds for a total of 14.6 mGy TECHNIQUE: The right neck and chest was prepped with chlorhexidine, and draped in the usual sterile fashion using maximum barrier technique (cap and mask, sterile gown, sterile gloves, large sterile sheet, hand hygiene and cutaneous antiseptic). Antibiotic prophylaxis was provided with 2g Ancef administered IV one hour prior to skin incision. Local anesthesia was attained by infiltration with 1% lidocaine with epinephrine.  Ultrasound demonstrated patency of the right internal jugular vein, and this was documented with an image. Under real-time ultrasound guidance, this vein was accessed with a 21 gauge micropuncture needle and image documentation was performed. A small dermatotomy was made at the access site with an 11 scalpel. A 0.018" wire was advanced into the SVC and the access needle exchanged for a 68F micropuncture vascular sheath. The 0.018" wire was then removed and a 0.035" wire advanced into the IVC. An appropriate location for the subcutaneous reservoir was selected below the clavicle and an incision was made through the skin and underlying soft tissues. The subcutaneous tissues were then dissected using a combination of blunt and sharp surgical technique and a pocket was formed. A single lumen power injectable portacatheter was then tunneled through the subcutaneous tissues from the pocket to the dermatotomy and the port reservoir placed within the subcutaneous pocket. The venous access site was then serially dilated and a peel away vascular sheath placed over the wire. The wire was removed and the port catheter advanced into position under fluoroscopic guidance. The catheter tip is positioned in the upper right atrium. This was documented with a spot image. The portacatheter was then tested and found to flush and aspirate  well. The port was flushed with saline followed by 100 units/mL heparinized saline. The pocket was then closed in two layers using first subdermal inverted interrupted absorbable sutures followed by a running subcuticular suture. The epidermis was then sealed with Dermabond. The dermatotomy at the venous access site was also sealed with Dermabond. COMPLICATIONS: None.  The patient tolerated the procedure well. IMPRESSION: Successful placement of a right IJ approach Power Port with ultrasound and fluoroscopic guidance. The catheter is ready for use. Signed, Criselda Peaches, MD [facility] Radiology Electronically Signed   By: Jacqulynn Cadet M.D.   On: 04/14/2015 11:48     PATHOLOGY:    ASSESSMENT AND PLAN:  Metastatic urothelial carcinoma ([place]) Metastatic, high grade urothelial cancer undergoing active treatment with Carboplatin/Gemcitabine on 04/14/2015.  Oncology history is updated.  S/P cycle 1 of treatment.    Today is a nadir check.  No role for labs today.  CBC performed 2 days ago.  He notes some fatigue.  "I'm used to going-and-going."  He notes that following day 1 of cycle 1, he felt fatigued, but this improved until day 8 of cycle 1.  He his now on the downward swing towards fatigue.  He is hopeful that he recovers in this regard prior to his next cycle.  His fatigue has not hindered his ability to do the things at home he wishes, he just notes a little more fatigue.  He notes 2 episodes of nausea which were aborted at home each time with home anti-emetics.  He notes a sour taste in his mouth.  He is on Prilosec currently and I will increase to 40 mg BID for the time being to see if that helps.  He is on a few medications that can insult the stomach.  Return as scheduled for follow-up and subsequent treatment.    THERAPY PLAN:  Continue with treatment as planned.  All questions were answered. The patient knows to call the clinic with any problems, questions or concerns. We can certainly see the patient much sooner if necessary.  Patient and plan discussed with Dr. Ancil Linsey and she is in agreement with the aforementioned.   This note is electronically signed by: Doy Mince 04/23/2015 10:05 AM

## 2015-04-25 ENCOUNTER — Telehealth (HOSPITAL_COMMUNITY): Payer: Self-pay | Admitting: *Deleted

## 2015-04-25 NOTE — Telephone Encounter (Signed)
I called patient to see how he was doing. Patient has more energy in the morning and less energy in the evening. He states that he feels yucky as in no energy. Patient vomited one time on Friday 04/23/15. He is otherwise ok other than no energy. He is in a neighboring town near [place] today. Pt reminded to drink at least 64 oz of water or decaff fluids daily. He said he probably needed to drink more than what he was. I reminded patient to call me if there were any problems.

## 2015-04-27 ENCOUNTER — Encounter: Payer: Self-pay | Admitting: *Deleted

## 2015-04-27 ENCOUNTER — Ambulatory Visit: Payer: Medicare HMO | Admitting: Nurse Practitioner

## 2015-04-27 NOTE — Progress Notes (Addendum)
[facility] Psychosocial Distress Screening Clinical Social Work  Clinical Social Work was referred by distress screening protocol.  The patient scored a 5 on the Psychosocial Distress Thermometer which indicates mild distress. Clinical Social Worker reviewed chart and phoned pt to assess for distress and other psychosocial needs. CSW left message introducing self and role of CSW. CSW will attempt to assist as future needs arise.   ONCBCN DISTRESS SCREENING 04/14/2015  Screening Type Initial Screening  Distress experienced in past week (1-10) 5  Practical problem type Insurance  Family Problem type Partner  Emotional problem type Adjusting to illness  Physical Problem type Pain;Sleep/insomnia  Physician notified of physical symptoms Yes  Referral to financial advocate Yes    Clinical Social Worker follow up needed: Yes.    If yes, follow up plan: See above   Loren Racer, [place] Worker [place]  [facility] Phone: [phone] Fax: [phone]

## 2015-05-04 NOTE — Assessment & Plan Note (Addendum)
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  Oncology history is up-to-date.  S/P cycle 1 of treatment.    At his nadir check appointment, he had a "sour stomach" and therefore, I increased his PPI to BID dosing.  This has resolved and therefore, he is advised to continue with BID dosing for a short time more.  I think going back down to daily dosing following this cycle of treatment would be reasonable.  I will re-image him with CT CAP in 2-3 weeks, prior to cycle #2.  Clinically, he is doing well and therefore, I suspect a response.  Return as scheduled for follow-up and subsequent treatment.

## 2015-05-04 NOTE — Progress Notes (Signed)
Harry Inches, MD [address] [place] [address]  Metastatic urothelial carcinoma [facility]) - Plan: CT Abdomen Pelvis W Contrast, CT Chest W Contrast  CURRENT THERAPY: Carboplatin/Gemcitabine in a day 1, 8 every 21 day fashion  INTERVAL HISTORY: Harry Franklin 74 y.o. 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    Metastatic urothelial carcinoma ([place])   04/02/2015 Imaging CT abd/pelvis- Innumberable hepatic masses, B/L adrenal mets, abdominopelvic lymphadenopathy, LLL lung lesion 6 mm, Lytic lesion of right L2 vertebral bosy, circumferential wall thickening of lower thoracic esophagus, mild diffuse bladder wall thickening.   04/02/2015 Pathology Results Urine Cytology- cells present suspicious for malignancy.  Suspicious for high-grade urothelial carcinoma.   04/08/2015 Procedure US guided biopsy of hepatic lesion   04/08/2015 Pathology Results Liver, needle/core biopsy, right - METASTATIC HIGH GRADE CARCINOMA,   04/14/2015 -  Chemotherapy Carboplatin/Gemcitabine Days 1, 8 every 21 days    I personally reviewed and went over laboratory results with the patient.  The results are noted within this dictation.  Labs meet treatment parameters.  Harry Franklin is anxious about repeat scans and he wants them as soon as possible.  He reports that he feels better.  He notes that he has recovered fully from his previous chemotherapy.  He notes some fatigue with treatment, but otherwise, he notes that his fatigue has resolved.  He wants to know how soon we can do scans.  I suspect clinically that he is responding to treatment as expected.  I think imaging him after this cycle is reasonable.  Past Medical History  Diagnosis Date  . GERD (gastroesophageal reflux disease)   . Hypokalemia   . Hypertension   . Cataract   . Arthritis   . Macular  degeneration, age related   . DVT (deep venous thrombosis) ([place])     developed after traveling x 1, developed another after left foot surgery  . Hydrocele in adult   . Metastatic urothelial carcinoma ([place]) 04/12/2015    has Dysphagia; Encounter for screening colonoscopy; Dysphagia, pharyngoesophageal phase; Reflux esophagitis; Peptic stricture of esophagus; Hiatal hernia; Abdominal pain; and Metastatic urothelial carcinoma ([place]) on his problem list.     is allergic to rabeprazole and vancomycin.  Current Outpatient Prescriptions on File Prior to Visit  Medication Sig Dispense Refill  . aspirin 325 MG tablet Take 325 mg by mouth daily.    . Calcium Carb-Cholecalciferol 600-500 MG-UNIT CAPS Take 1 capsule by mouth 2 (two) times daily.    . calcium carbonate (TUMS EX) 750 MG chewable tablet Chew 2 tablets by mouth 2 (two) times daily as needed for heartburn.    Marland Kitchen CARBOPLATIN IV Inject into the vein. Day 1 every 21 days    . Gemcitabine HCl (GEMZAR IV) Inject into the vein. Days 1, 8 every 21 days    . lidocaine-prilocaine (EMLA) cream Apply a quarter size amount to port site 1 hour prior to chemo. Do not rub in. Cover with plastic wrap. 30 g 3  . metoprolol (LOPRESSOR) 100 MG tablet Take 100 mg by mouth at bedtime.     . Multiple Vitamins-Minerals (PRESERVISION AREDS 2 PO) Take 1 tablet by mouth 2 (two) times daily.    Marland Kitchen omeprazole (PRILOSEC) 40 MG capsule Take 40 mg by mouth daily.    . ondansetron (ZOFRAN) 8 MG tablet Take 1 tablet (8 mg total) by mouth  every 8 (eight) hours as needed for nausea or vomiting. 30 tablet 2  . ranitidine (ZANTAC) 150 MG capsule Take 150 mg by mouth 2 (two) times daily.    Marland Kitchen oxyCODONE (OXY IR/ROXICODONE) 5 MG immediate release tablet Take 1-2 tablets (5-10 mg total) by mouth every 4 (four) hours as needed for severe pain. (Patient not taking: Reported on 04/23/2015) 60 tablet 0  . prochlorperazine (COMPAZINE) 10 MG tablet Take 1 tablet (10 mg total) by mouth every  6 (six) hours as needed for nausea or vomiting. (Patient not taking: Reported on 04/23/2015) 30 tablet 2   No current facility-administered medications on file prior to visit.    Past Surgical History  Procedure Laterality Date  . Replacement total knee    . Appendectomy    . Hammer toe surgery Left   . Lapidus procedure    . Hallux fusion    . Colonoscopy  03/31/05  . Colonoscopy N/A 10/02/2014    [id number] diverticulosis  . Esophagogastroduodenoscopy N/A 10/02/2014    [id number] ulcerative reflux/s/p dilation/large HH  . Maloney dilation N/A 10/02/2014    Procedure: Venia Minks DILATION;  Surgeon: Daneil Dolin, MD;  [facility];  Service: Endoscopy;  Laterality: N/A;  . Esophagogastroduodenoscopy N/A 01/29/2015    Procedure: ESOPHAGOGASTRODUODENOSCOPY (EGD);  Surgeon: Daneil Dolin, MD;  Location: [facility];  Service: Endoscopy;  Laterality: N/A;  0800-rescheduled to 9/9 @ 915 [address] notified pt  . Esophageal dilation  01/29/2015    Procedure: ESOPHAGEAL DILATION;  Surgeon: Daneil Dolin, MD;  Location: [facility];  Service: Endoscopy;;  . Portacath placement Left 04/13/15    Denies any headaches, dizziness, double vision, fevers, chills, night sweats, nausea, vomiting, diarrhea, constipation, chest pain, heart palpitations, shortness of breath, blood in stool, black tarry stool, urinary pain, urinary burning, urinary frequency, hematuria.   PHYSICAL EXAMINATION  ECOG PERFORMANCE STATUS: 0 - Asymptomatic  Filed Vitals:   05/05/15 1006  BP: 145/76  Pulse: 79  Temp: 97.7 F (36.5 C)  Resp: 20    GENERAL:alert, no distress, well nourished, well developed, comfortable, cooperative, smiling and accompanied by family. SKIN: skin color, texture, turgor are normal, no rashes or significant lesions HEAD: Normocephalic, No masses, lesions, tenderness or abnormalities EYES: normal, EOMI, Conjunctiva are pink and non-injected EARS: External ears normal OROPHARYNX:lips,  buccal mucosa, and tongue normal and mucous membranes are moist  NECK: supple, no stridor, trachea midline LYMPH:  no palpable lymphadenopathy, no hepatosplenomegaly BREAST:not examined LUNGS: clear to auscultation and percussion HEART: regular rate & rhythm ABDOMEN:abdomen soft, non-tender, normal bowel sounds and no masses or organomegaly BACK: Back symmetric, no curvature. EXTREMITIES:less then 2 second capillary refill, no edema, no skin discoloration, no clubbing, no cyanosis  NEURO: alert & oriented x 3 with fluent speech, no focal motor/sensory deficits, gait normal   LABORATORY DATA: CBC    Component Value Date/Time   WBC 6.7 05/05/2015 1030   RBC 3.93* 05/05/2015 1030   HGB 11.9* 05/05/2015 1030   HCT 37.2* 05/05/2015 1030   PLT 424* 05/05/2015 1030   MCV 94.7 05/05/2015 1030   MCH 30.3 05/05/2015 1030   MCHC 32.0 05/05/2015 1030   RDW 14.6 05/05/2015 1030   LYMPHSABS 1.7 05/05/2015 1030   MONOABS 1.2* 05/05/2015 1030   EOSABS 0.1 05/05/2015 1030   BASOSABS 0.0 05/05/2015 1030      Chemistry      Component Value Date/Time   NA 137 05/05/2015 1030   K 4.0 05/05/2015 1030  CL 104 05/05/2015 1030   CO2 28 05/05/2015 1030   BUN 22* 05/05/2015 1030   CREATININE 1.07 05/05/2015 1030      Component Value Date/Time   CALCIUM 8.5* 05/05/2015 1030   ALKPHOS 172* 05/05/2015 1030   AST 35 05/05/2015 1030   ALT 42 05/05/2015 1030   BILITOT 0.5 05/05/2015 1030        PENDING LABS:   RADIOGRAPHIC STUDIES:  US Biopsy  04/08/2015  CLINICAL DATA:  Numerous hepatic masses concerning for liver metastases. No known primary. EXAM: ULTRASOUND GUIDED CORE BIOPSY OF ANTERIOR RIGHT LIVER MASS MEDICATIONS: 1.0 mg IV Versed; 50 mcg IV Fentanyl Total Moderate Sedation Time: 6 PROCEDURE: The procedure, risks, benefits, and alternatives were explained to the patient. Questions regarding the procedure were encouraged and answered. The patient understands and consents to the  procedure. The right upper quadrant was prepped with ChloraPrep in a sterile fashion, and a sterile drape was applied covering the operative field. A sterile gown and sterile gloves were used for the procedure. Local anesthesia was provided with 1% Lidocaine. Previous imaging reviewed. Patient positioned supine. Noncontrast localization CT performed. A hypoechoic heterogeneous mass in the anterior right hepatic lobe adjacent to the gallbladder was localized. This was correlated with the CT. Under sterile conditions and local anesthesia, a 17 gauge 6.8 cm access needle was advanced percutaneously to the lesion. Needle position confirmed with ultrasound. 18 gauge core biopsies obtained. Samples placed in formalin. No immediate complication. Patient tolerated the biopsy well. COMPLICATIONS: None. FINDINGS: Imaging confirms needle placement in the anterior right hepatic mass for core biopsy IMPRESSION: Successful ultrasound right hepatic mass 18 gauge core biopsy Electronically Signed   By: Jerilynn Mages.  Shick M.D.   On: 04/08/2015 13:43   Ir Fluoro Guide Cv Line Right  04/14/2015  CLINICAL DATA:  74 year old male with urothelial carcinoma in need of durable central venous access for chemotherapy. EXAM: IR RIGHT FLOURO GUIDE CV LINE; IR ULTRASOUND GUIDANCE VASC ACCESS RIGHT Date: 04/14/2015 ANESTHESIA/SEDATION: Moderate (conscious) sedation was administered during this procedure. A total of 2 mg Versed and 100 mg Fentanyl were administered intravenously. The patient's vital signs were monitored continuously by radiology nursing throughout the course of the procedure. Total sedation time: 20 minutes FLUOROSCOPY TIME:  24 seconds for a total of 14.6 mGy TECHNIQUE: The right neck and chest was prepped with chlorhexidine, and draped in the usual sterile fashion using maximum barrier technique (cap and mask, sterile gown, sterile gloves, large sterile sheet, hand hygiene and cutaneous antiseptic). Antibiotic prophylaxis was  provided with 2g Ancef administered IV one hour prior to skin incision. Local anesthesia was attained by infiltration with 1% lidocaine with epinephrine. Ultrasound demonstrated patency of the right internal jugular vein, and this was documented with an image. Under real-time ultrasound guidance, this vein was accessed with a 21 gauge micropuncture needle and image documentation was performed. A small dermatotomy was made at the access site with an 11 scalpel. A 0.018" wire was advanced into the SVC and the access needle exchanged for a 40F micropuncture vascular sheath. The 0.018" wire was then removed and a 0.035" wire advanced into the IVC. An appropriate location for the subcutaneous reservoir was selected below the clavicle and an incision was made through the skin and underlying soft tissues. The subcutaneous tissues were then dissected using a combination of blunt and sharp surgical technique and a pocket was formed. A single lumen power injectable portacatheter was then tunneled through the subcutaneous tissues from the pocket to  the dermatotomy and the port reservoir placed within the subcutaneous pocket. The venous access site was then serially dilated and a peel away vascular sheath placed over the wire. The wire was removed and the port catheter advanced into position under fluoroscopic guidance. The catheter tip is positioned in the upper right atrium. This was documented with a spot image. The portacatheter was then tested and found to flush and aspirate well. The port was flushed with saline followed by 100 units/mL heparinized saline. The pocket was then closed in two layers using first subdermal inverted interrupted absorbable sutures followed by a running subcuticular suture. The epidermis was then sealed with Dermabond. The dermatotomy at the venous access site was also sealed with Dermabond. COMPLICATIONS: None.  The patient tolerated the procedure well. IMPRESSION: Successful placement of a right  IJ approach Power Port with ultrasound and fluoroscopic guidance. The catheter is ready for use. Signed, Criselda Peaches, MD [facility] Radiology Electronically Signed   By: Jacqulynn Cadet M.D.   On: 04/14/2015 11:48   Ir US Guide Vasc Access Right  04/14/2015  CLINICAL DATA:  74 year old male with urothelial carcinoma in need of durable central venous access for chemotherapy. EXAM: IR RIGHT FLOURO GUIDE CV LINE; IR ULTRASOUND GUIDANCE VASC ACCESS RIGHT Date: 04/14/2015 ANESTHESIA/SEDATION: Moderate (conscious) sedation was administered during this procedure. A total of 2 mg Versed and 100 mg Fentanyl were administered intravenously. The patient's vital signs were monitored continuously by radiology nursing throughout the course of the procedure. Total sedation time: 20 minutes FLUOROSCOPY TIME:  24 seconds for a total of 14.6 mGy TECHNIQUE: The right neck and chest was prepped with chlorhexidine, and draped in the usual sterile fashion using maximum barrier technique (cap and mask, sterile gown, sterile gloves, large sterile sheet, hand hygiene and cutaneous antiseptic). Antibiotic prophylaxis was provided with 2g Ancef administered IV one hour prior to skin incision. Local anesthesia was attained by infiltration with 1% lidocaine with epinephrine. Ultrasound demonstrated patency of the right internal jugular vein, and this was documented with an image. Under real-time ultrasound guidance, this vein was accessed with a 21 gauge micropuncture needle and image documentation was performed. A small dermatotomy was made at the access site with an 11 scalpel. A 0.018" wire was advanced into the SVC and the access needle exchanged for a 52F micropuncture vascular sheath. The 0.018" wire was then removed and a 0.035" wire advanced into the IVC. An appropriate location for the subcutaneous reservoir was selected below the clavicle and an incision was made through  the skin and underlying soft tissues. The subcutaneous tissues were then dissected using a combination of blunt and sharp surgical technique and a pocket was formed. A single lumen power injectable portacatheter was then tunneled through the subcutaneous tissues from the pocket to the dermatotomy and the port reservoir placed within the subcutaneous pocket. The venous access site was then serially dilated and a peel away vascular sheath placed over the wire. The wire was removed and the port catheter advanced into position under fluoroscopic guidance. The catheter tip is positioned in the upper right atrium. This was documented with a spot image. The portacatheter was then tested and found to flush and aspirate well. The port was flushed with saline followed by 100 units/mL heparinized saline. The pocket was then closed in two layers using first subdermal inverted interrupted absorbable sutures followed by a running subcuticular suture. The epidermis was then sealed with Dermabond. The dermatotomy at the venous  access site was also sealed with Dermabond. COMPLICATIONS: None.  The patient tolerated the procedure well. IMPRESSION: Successful placement of a right IJ approach Power Port with ultrasound and fluoroscopic guidance. The catheter is ready for use. Signed, Criselda Peaches, MD [facility] Radiology Electronically Signed   By: Jacqulynn Cadet M.D.   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  Oncology history is up-to-date.  S/P cycle 1 of treatment.    At his nadir check appointment, he had a "sour stomach" and therefore, I increased his PPI to BID dosing.  This has resolved and therefore, he is advised to continue with BID dosing for a short time more.  I think going back  down to daily dosing following this cycle of treatment would be reasonable.  I will re-image him with CT CAP in 2-3 weeks, prior to cycle #2.  Clinically, he is doing well and therefore, I suspect a response.  Return as scheduled for follow-up and subsequent treatment.    THERAPY PLAN:  Continue with treatment as planned.  All questions were answered. The patient knows to call the clinic with any problems, questions or concerns. We can certainly see the patient much sooner if necessary.  Patient and plan discussed with Dr. Ancil Linsey and she is in agreement with the aforementioned.   This note is electronically signed by: Doy Mince 05/05/2015 11:17 AM

## 2015-05-05 ENCOUNTER — Encounter (HOSPITAL_BASED_OUTPATIENT_CLINIC_OR_DEPARTMENT_OTHER): Payer: Medicare HMO

## 2015-05-05 ENCOUNTER — Encounter (HOSPITAL_COMMUNITY): Payer: Self-pay | Admitting: Oncology

## 2015-05-05 ENCOUNTER — Encounter (HOSPITAL_BASED_OUTPATIENT_CLINIC_OR_DEPARTMENT_OTHER): Payer: Medicare HMO | Admitting: Oncology

## 2015-05-05 VITALS — BP 136/78 | HR 85 | Temp 98.1°F | Resp 14

## 2015-05-05 VITALS — BP 145/76 | HR 79 | Temp 97.7°F | Resp 20 | Wt 263.0 lb

## 2015-05-05 DIAGNOSIS — C801 Malignant (primary) neoplasm, unspecified: Secondary | ICD-10-CM | POA: Diagnosis not present

## 2015-05-05 DIAGNOSIS — C7910 Secondary malignant neoplasm of unspecified urinary organs: Secondary | ICD-10-CM | POA: Diagnosis not present

## 2015-05-05 DIAGNOSIS — Z5111 Encounter for antineoplastic chemotherapy: Secondary | ICD-10-CM

## 2015-05-05 LAB — COMPREHENSIVE METABOLIC PANEL
ALT: 42 U/L (ref 17–63)
ANION GAP: 5 (ref 5–15)
AST: 35 U/L (ref 15–41)
Albumin: 2.9 g/dL — ABNORMAL LOW (ref 3.5–5.0)
Alkaline Phosphatase: 172 U/L — ABNORMAL HIGH (ref 38–126)
BILIRUBIN TOTAL: 0.5 mg/dL (ref 0.3–1.2)
BUN: 22 mg/dL — ABNORMAL HIGH (ref 6–20)
CALCIUM: 8.5 mg/dL — AB (ref 8.9–10.3)
CO2: 28 mmol/L (ref 22–32)
Chloride: 104 mmol/L (ref 101–111)
Creatinine, Ser: 1.07 mg/dL (ref 0.61–1.24)
GFR calc Af Amer: >60 mL/min (ref >60)
Glucose, Bld: 105 mg/dL — ABNORMAL HIGH (ref 65–99)
POTASSIUM: 4 mmol/L (ref 3.5–5.1)
Sodium: 137 mmol/L (ref 135–145)
TOTAL PROTEIN: 6.2 g/dL — AB (ref 6.5–8.1)

## 2015-05-05 LAB — CBC WITH DIFFERENTIAL/PLATELET
Basophils Absolute: 0 10*3/uL (ref 0.0–0.1)
Basophils Relative: 1 %
Eosinophils Absolute: 0.1 10*3/uL (ref 0.0–0.7)
Eosinophils Relative: 2 %
HEMATOCRIT: 37.2 % — AB (ref 39.0–52.0)
Hemoglobin: 11.9 g/dL — ABNORMAL LOW (ref 13.0–17.0)
LYMPHS PCT: 25 %
Lymphs Abs: 1.7 10*3/uL (ref 0.7–4.0)
MCH: 30.3 pg (ref 26.0–34.0)
MCHC: 32 g/dL (ref 30.0–36.0)
MCV: 94.7 fL (ref 78.0–100.0)
MONO ABS: 1.2 10*3/uL — AB (ref 0.1–1.0)
MONOS PCT: 18 %
NEUTROS ABS: 3.7 10*3/uL (ref 1.7–7.7)
Neutrophils Relative %: 55 %
Platelets: 424 10*3/uL — ABNORMAL HIGH (ref 150–400)
RBC: 3.93 MIL/uL — ABNORMAL LOW (ref 4.22–5.81)
RDW: 14.6 % (ref 11.5–15.5)
WBC: 6.7 10*3/uL (ref 4.0–10.5)

## 2015-05-05 MED ORDER — SODIUM CHLORIDE 0.9 % IV SOLN
10.0000 mg | Freq: Once | INTRAVENOUS | Status: AC
  Administered 2015-05-05: 10 mg via INTRAVENOUS
  Filled 2015-05-05: qty 1

## 2015-05-05 MED ORDER — SODIUM CHLORIDE 0.9 % IJ SOLN: 10.0000 mL | INTRAMUSCULAR | Status: DC | PRN

## 2015-05-05 MED ORDER — HEPARIN SOD (PORK) LOCK FLUSH 100 UNIT/ML IV SOLN
500.0000 [IU] | Freq: Once | INTRAVENOUS | Status: AC | PRN
  Administered 2015-05-05: 500 [IU]
  Filled 2015-05-05: qty 5

## 2015-05-05 MED ORDER — SODIUM CHLORIDE 0.9 % IV SOLN
Freq: Once | INTRAVENOUS | Status: AC
  Administered 2015-05-05: 11:00:00 via INTRAVENOUS

## 2015-05-05 MED ORDER — PALONOSETRON HCL INJECTION 0.25 MG/5ML
INTRAVENOUS | Status: AC
Start: 2015-05-05 — End: 2015-05-05
  Filled 2015-05-05: qty 5

## 2015-05-05 MED ORDER — PALONOSETRON HCL INJECTION 0.25 MG/5ML
0.2500 mg | Freq: Once | INTRAVENOUS | Status: AC
  Administered 2015-05-05: 0.25 mg via INTRAVENOUS

## 2015-05-05 MED ORDER — SODIUM CHLORIDE 0.9 % IV SOLN
750.0000 mg/m2 | Freq: Once | INTRAVENOUS | Status: AC
  Administered 2015-05-05: 1900 mg via INTRAVENOUS
  Filled 2015-05-05: qty 49.97

## 2015-05-05 MED ORDER — SODIUM CHLORIDE 0.9 % IV SOLN
661.5000 mg | Freq: Once | INTRAVENOUS | Status: AC
  Administered 2015-05-05: 660 mg via INTRAVENOUS
  Filled 2015-05-05: qty 66

## 2015-05-05 NOTE — Patient Instructions (Signed)
[facility] Discharge Instructions for Patients Receiving Chemotherapy  Today you received the following chemotherapy agents: Carboplatin and Gemzar   If you develop nausea and vomiting, or diarrhea that is not controlled by your medication, call the clinic.  The clinic phone number is [phone]. Office hours are Monday-Friday 8:30am-5:00pm.  BELOW ARE SYMPTOMS THAT SHOULD BE REPORTED IMMEDIATELY:  *FEVER GREATER THAN 101.0 F  *CHILLS WITH OR WITHOUT FEVER  NAUSEA AND VOMITING THAT IS NOT CONTROLLED WITH YOUR NAUSEA MEDICATION  *UNUSUAL SHORTNESS OF BREATH  *UNUSUAL BRUISING OR BLEEDING  TENDERNESS IN MOUTH AND THROAT WITH OR WITHOUT PRESENCE OF ULCERS  *URINARY PROBLEMS  *BOWEL PROBLEMS  UNUSUAL RASH Items with * indicate a potential emergency and should be followed up as soon as possible. If you have an emergency after office hours please contact your primary care physician or go to the nearest emergency department.  Please call the clinic during office hours if you have any questions or concerns.   You may also contact the Patient Navigator at [phone] should you have any questions or need assistance in obtaining follow up care.

## 2015-05-05 NOTE — Patient Instructions (Addendum)
[place] at [facility] Discharge Instructions  RECOMMENDATIONS MADE BY THE CONSULTANT AND ANY TEST RESULTS WILL BE SENT TO YOUR REFERRING PHYSICIAN. Exam and discussion by Robynn Pane, PA-C Will treat today as your blood work is good. Report fevers, uncontrolled nausea, vomiting or other concerns.  Follow-up in 1 week with labs and possible treatment CT scans in 3 weeks Follow-up after scans.         Thank you for choosing [place] at [facility] to provide your oncology and hematology care.  To afford each patient quality time with our provider, please arrive at least 15 minutes before your scheduled appointment time.    You need to re-schedule your appointment should you arrive 10 or more minutes late.  We strive to give you quality time with our providers, and arriving late affects you and other patients whose appointments are after yours.  Also, if you no show three or more times for appointments you may be dismissed from the clinic at the providers discretion.     Again, thank you for choosing [facility].  Our hope is that these requests will decrease the amount of time that you wait before being seen by our physicians.       _____________________________________________________________  Should you have questions after your visit to [facility], please contact our office at [phone] between the hours of 8:30 a.m. and 4:30 p.m.  Voicemails left after 4:30 p.m. will not be returned until the following business day.  For prescription refill requests, have your pharmacy contact our office.

## 2015-05-10 ENCOUNTER — Other Ambulatory Visit (HOSPITAL_COMMUNITY): Payer: Self-pay | Admitting: Emergency Medicine

## 2015-05-10 ENCOUNTER — Other Ambulatory Visit (HOSPITAL_COMMUNITY): Payer: Self-pay | Admitting: Oncology

## 2015-05-10 DIAGNOSIS — R1011 Right upper quadrant pain: Secondary | ICD-10-CM

## 2015-05-10 MED ORDER — DEXAMETHASONE 4 MG PO TABS: ORAL_TABLET | ORAL | Status: AC

## 2015-05-10 NOTE — Progress Notes (Signed)
Pt called this am, states that he has been having sharp right sided pain that is intermittent.  Pt states that he is having no other symptoms.  Only things is that he tires very easily around lunch time. Spoke with Kirby Crigler PA dexamethasone called into pharmacy, pt notified and pt verbalized understanding.

## 2015-05-12 ENCOUNTER — Encounter (HOSPITAL_BASED_OUTPATIENT_CLINIC_OR_DEPARTMENT_OTHER): Payer: Medicare HMO

## 2015-05-12 VITALS — BP 151/89 | HR 67 | Temp 98.2°F | Resp 16 | Wt 261.6 lb

## 2015-05-12 DIAGNOSIS — C801 Malignant (primary) neoplasm, unspecified: Secondary | ICD-10-CM | POA: Diagnosis not present

## 2015-05-12 DIAGNOSIS — Z5111 Encounter for antineoplastic chemotherapy: Secondary | ICD-10-CM

## 2015-05-12 DIAGNOSIS — C7910 Secondary malignant neoplasm of unspecified urinary organs: Secondary | ICD-10-CM | POA: Diagnosis not present

## 2015-05-12 DIAGNOSIS — Z5189 Encounter for other specified aftercare: Secondary | ICD-10-CM

## 2015-05-12 LAB — CBC WITH DIFFERENTIAL/PLATELET
BASOS PCT: 0 %
Basophils Absolute: 0 10*3/uL (ref 0.0–0.1)
EOS ABS: 0 10*3/uL (ref 0.0–0.7)
Eosinophils Relative: 0 %
HCT: 35 % — ABNORMAL LOW (ref 39.0–52.0)
Hemoglobin: 11.6 g/dL — ABNORMAL LOW (ref 13.0–17.0)
Lymphocytes Relative: 10 %
Lymphs Abs: 0.9 10*3/uL (ref 0.7–4.0)
MCH: 30.8 pg (ref 26.0–34.0)
MCHC: 33.1 g/dL (ref 30.0–36.0)
MCV: 92.8 fL (ref 78.0–100.0)
MONO ABS: 0.3 10*3/uL (ref 0.1–1.0)
MONOS PCT: 3 %
NEUTROS PCT: 87 %
Neutro Abs: 8.1 10*3/uL — ABNORMAL HIGH (ref 1.7–7.7)
PLATELETS: 369 10*3/uL (ref 150–400)
RBC: 3.77 MIL/uL — ABNORMAL LOW (ref 4.22–5.81)
RDW: 14.4 % (ref 11.5–15.5)
WBC: 9.3 10*3/uL (ref 4.0–10.5)

## 2015-05-12 LAB — COMPREHENSIVE METABOLIC PANEL
ALBUMIN: 3.2 g/dL — AB (ref 3.5–5.0)
ALT: 89 U/L — ABNORMAL HIGH (ref 17–63)
ANION GAP: 6 (ref 5–15)
AST: 70 U/L — ABNORMAL HIGH (ref 15–41)
Alkaline Phosphatase: 153 U/L — ABNORMAL HIGH (ref 38–126)
BUN: 27 mg/dL — ABNORMAL HIGH (ref 6–20)
CO2: 29 mmol/L (ref 22–32)
Calcium: 8.8 mg/dL — ABNORMAL LOW (ref 8.9–10.3)
Chloride: 103 mmol/L (ref 101–111)
Creatinine, Ser: 1.14 mg/dL (ref 0.61–1.24)
GFR calc Af Amer: >60 mL/min (ref >60)
GFR calc non Af Amer: >60 mL/min (ref >60)
GLUCOSE: 124 mg/dL — AB (ref 65–99)
POTASSIUM: 4.4 mmol/L (ref 3.5–5.1)
SODIUM: 138 mmol/L (ref 135–145)
Total Bilirubin: 0.7 mg/dL (ref 0.3–1.2)
Total Protein: 6.5 g/dL (ref 6.5–8.1)

## 2015-05-12 MED ORDER — SODIUM CHLORIDE 0.9 % IV SOLN
Freq: Once | INTRAVENOUS | Status: AC
  Administered 2015-05-12: 11:00:00 via INTRAVENOUS

## 2015-05-12 MED ORDER — PEGFILGRASTIM 6 MG/0.6ML ~~LOC~~ PSKT
PREFILLED_SYRINGE | SUBCUTANEOUS | Status: AC
  Filled 2015-05-12: qty 0.6

## 2015-05-12 MED ORDER — PEGFILGRASTIM 6 MG/0.6ML ~~LOC~~ PSKT
6.0000 mg | PREFILLED_SYRINGE | Freq: Once | SUBCUTANEOUS | Status: AC
  Administered 2015-05-12: 6 mg via SUBCUTANEOUS

## 2015-05-12 MED ORDER — HEPARIN SOD (PORK) LOCK FLUSH 100 UNIT/ML IV SOLN
500.0000 [IU] | Freq: Once | INTRAVENOUS | Status: AC | PRN
  Administered 2015-05-12: 500 [IU]

## 2015-05-12 MED ORDER — SODIUM CHLORIDE 0.9 % IJ SOLN: 10.0000 mL | INTRAMUSCULAR | Status: DC | PRN

## 2015-05-12 MED ORDER — PROCHLORPERAZINE MALEATE 10 MG PO TABS
ORAL_TABLET | ORAL | Status: AC
  Filled 2015-05-12: qty 1

## 2015-05-12 MED ORDER — SODIUM CHLORIDE 0.9 % IV SOLN
8.0000 mg | Freq: Once | INTRAVENOUS | Status: AC
  Administered 2015-05-12: 8 mg via INTRAVENOUS
  Filled 2015-05-12: qty 4

## 2015-05-12 MED ORDER — SODIUM CHLORIDE 0.9 % IV SOLN
10.0000 mg | Freq: Once | INTRAVENOUS | Status: AC
  Administered 2015-05-12: 10 mg via INTRAVENOUS
  Filled 2015-05-12: qty 1

## 2015-05-12 MED ORDER — SODIUM CHLORIDE 0.9 % IV SOLN
750.0000 mg/m2 | Freq: Once | INTRAVENOUS | Status: AC
  Administered 2015-05-12: 1900 mg via INTRAVENOUS
  Filled 2015-05-12: qty 49.97

## 2015-05-12 MED ORDER — PROCHLORPERAZINE MALEATE 10 MG PO TABS
10.0000 mg | ORAL_TABLET | Freq: Once | ORAL | Status: AC
  Administered 2015-05-12: 10 mg via ORAL

## 2015-05-12 NOTE — Progress Notes (Signed)
Patient tolerated infusion well.  VSS.  Neulasta OnPro applied to left arm.  Patient understands how this works and when to remove it.

## 2015-05-12 NOTE — Patient Instructions (Signed)
[facility] Discharge Instructions for Patients Receiving Chemotherapy  Today you received the following chemotherapy agent: Gemzar.   You also got the Neulasta OnPro injector today.     If you develop nausea and vomiting, or diarrhea that is not controlled by your medication, call the clinic.  The clinic phone number is [phone]. Office hours are Monday-Friday 8:30am-5:00pm.  BELOW ARE SYMPTOMS THAT SHOULD BE REPORTED IMMEDIATELY:  *FEVER GREATER THAN 101.0 F  *CHILLS WITH OR WITHOUT FEVER  NAUSEA AND VOMITING THAT IS NOT CONTROLLED WITH YOUR NAUSEA MEDICATION  *UNUSUAL SHORTNESS OF BREATH  *UNUSUAL BRUISING OR BLEEDING  TENDERNESS IN MOUTH AND THROAT WITH OR WITHOUT PRESENCE OF ULCERS  *URINARY PROBLEMS  *BOWEL PROBLEMS  UNUSUAL RASH Items with * indicate a potential emergency and should be followed up as soon as possible. If you have an emergency after office hours please contact your primary care physician or go to the nearest emergency department.  Please call the clinic during office hours if you have any questions or concerns.   You may also contact the Patient Navigator at [phone] should you have any questions or need assistance in obtaining follow up care.

## 2015-05-25 ENCOUNTER — Ambulatory Visit (HOSPITAL_COMMUNITY)
Admission: RE | Admit: 2015-05-25 | Discharge: 2015-05-25 | Disposition: A | Discharge status: Home / Self Care | Payer: Medicare HMO | Source: Ambulatory Visit | Attending: Oncology | Admitting: Oncology

## 2015-05-25 DIAGNOSIS — R591 Generalized enlarged lymph nodes: Secondary | ICD-10-CM | POA: Insufficient documentation

## 2015-05-25 DIAGNOSIS — C787 Secondary malignant neoplasm of liver and intrahepatic bile duct: Secondary | ICD-10-CM | POA: Insufficient documentation

## 2015-05-25 DIAGNOSIS — Z08 Encounter for follow-up examination after completed treatment for malignant neoplasm: Secondary | ICD-10-CM | POA: Insufficient documentation

## 2015-05-25 DIAGNOSIS — C7972 Secondary malignant neoplasm of left adrenal gland: Secondary | ICD-10-CM | POA: Diagnosis not present

## 2015-05-25 DIAGNOSIS — C7910 Secondary malignant neoplasm of unspecified urinary organs: Secondary | ICD-10-CM

## 2015-05-25 DIAGNOSIS — I2510 Atherosclerotic heart disease of native coronary artery without angina pectoris: Secondary | ICD-10-CM | POA: Insufficient documentation

## 2015-05-25 DIAGNOSIS — C7951 Secondary malignant neoplasm of bone: Secondary | ICD-10-CM | POA: Diagnosis not present

## 2015-05-25 DIAGNOSIS — C689 Malignant neoplasm of urinary organ, unspecified: Secondary | ICD-10-CM | POA: Diagnosis not present

## 2015-05-25 MED ORDER — IOHEXOL 300 MG/ML  SOLN
100.0000 mL | Freq: Once | INTRAMUSCULAR | Status: AC | PRN
  Administered 2015-05-25: 100 mL via INTRAVENOUS

## 2015-05-26 ENCOUNTER — Encounter (HOSPITAL_BASED_OUTPATIENT_CLINIC_OR_DEPARTMENT_OTHER): Payer: Medicare HMO | Admitting: Hematology & Oncology

## 2015-05-26 ENCOUNTER — Other Ambulatory Visit: Payer: Self-pay

## 2015-05-26 ENCOUNTER — Encounter (HOSPITAL_COMMUNITY): Payer: Self-pay | Admitting: Hematology & Oncology

## 2015-05-26 ENCOUNTER — Ambulatory Visit (HOSPITAL_COMMUNITY): Payer: Medicare HMO | Admitting: Hematology & Oncology

## 2015-05-26 ENCOUNTER — Encounter (HOSPITAL_COMMUNITY): Payer: Medicare HMO | Attending: Hematology & Oncology

## 2015-05-26 VITALS — BP 130/63 | HR 85 | Temp 98.3°F | Resp 20

## 2015-05-26 VITALS — BP 131/67 | HR 79 | Temp 98.1°F | Resp 20 | Wt 270.0 lb

## 2015-05-26 DIAGNOSIS — C7951 Secondary malignant neoplasm of bone: Secondary | ICD-10-CM

## 2015-05-26 DIAGNOSIS — C649 Malignant neoplasm of unspecified kidney, except renal pelvis: Secondary | ICD-10-CM | POA: Diagnosis not present

## 2015-05-26 DIAGNOSIS — C7970 Secondary malignant neoplasm of unspecified adrenal gland: Secondary | ICD-10-CM | POA: Diagnosis not present

## 2015-05-26 DIAGNOSIS — C787 Secondary malignant neoplasm of liver and intrahepatic bile duct: Secondary | ICD-10-CM

## 2015-05-26 DIAGNOSIS — K219 Gastro-esophageal reflux disease without esophagitis: Secondary | ICD-10-CM

## 2015-05-26 DIAGNOSIS — R0789 Other chest pain: Secondary | ICD-10-CM

## 2015-05-26 DIAGNOSIS — R079 Chest pain, unspecified: Secondary | ICD-10-CM

## 2015-05-26 DIAGNOSIS — C7910 Secondary malignant neoplasm of unspecified urinary organs: Secondary | ICD-10-CM | POA: Diagnosis not present

## 2015-05-26 DIAGNOSIS — D6481 Anemia due to antineoplastic chemotherapy: Secondary | ICD-10-CM

## 2015-05-26 DIAGNOSIS — T451X5A Adverse effect of antineoplastic and immunosuppressive drugs, initial encounter: Secondary | ICD-10-CM

## 2015-05-26 LAB — CBC WITH DIFFERENTIAL/PLATELET
BASOS ABS: 0 10*3/uL (ref 0.0–0.1)
Basophils Relative: 0 %
EOS ABS: 0.1 10*3/uL (ref 0.0–0.7)
EOS PCT: 1 %
HCT: 32.9 % — ABNORMAL LOW (ref 39.0–52.0)
Hemoglobin: 10.7 g/dL — ABNORMAL LOW (ref 13.0–17.0)
LYMPHS PCT: 16 %
Lymphs Abs: 1.1 10*3/uL (ref 0.7–4.0)
MCH: 30.9 pg (ref 26.0–34.0)
MCHC: 32.5 g/dL (ref 30.0–36.0)
MCV: 95.1 fL (ref 78.0–100.0)
Monocytes Absolute: 0.9 10*3/uL (ref 0.1–1.0)
Monocytes Relative: 13 %
NEUTROS PCT: 70 %
Neutro Abs: 4.7 10*3/uL (ref 1.7–7.7)
PLATELETS: 217 10*3/uL (ref 150–400)
RBC: 3.46 MIL/uL — AB (ref 4.22–5.81)
RDW: 17 % — ABNORMAL HIGH (ref 11.5–15.5)
WBC: 6.8 10*3/uL (ref 4.0–10.5)

## 2015-05-26 LAB — COMPREHENSIVE METABOLIC PANEL
ALBUMIN: 2.8 g/dL — AB (ref 3.5–5.0)
ALT: 31 U/L (ref 17–63)
AST: 28 U/L (ref 15–41)
Alkaline Phosphatase: 141 U/L — ABNORMAL HIGH (ref 38–126)
Anion gap: 7 (ref 5–15)
BUN: 14 mg/dL (ref 6–20)
CHLORIDE: 107 mmol/L (ref 101–111)
CO2: 26 mmol/L (ref 22–32)
CREATININE: 1.18 mg/dL (ref 0.61–1.24)
Calcium: 8.4 mg/dL — ABNORMAL LOW (ref 8.9–10.3)
GFR calc Af Amer: >60 mL/min (ref >60)
GFR calc non Af Amer: 60 mL/min — ABNORMAL LOW (ref >60)
GLUCOSE: 122 mg/dL — AB (ref 65–99)
Potassium: 4 mmol/L (ref 3.5–5.1)
SODIUM: 140 mmol/L (ref 135–145)
Total Bilirubin: 0.6 mg/dL (ref 0.3–1.2)
Total Protein: 5.9 g/dL — ABNORMAL LOW (ref 6.5–8.1)

## 2015-05-26 MED ORDER — HEPARIN SOD (PORK) LOCK FLUSH 100 UNIT/ML IV SOLN
500.0000 [IU] | Freq: Once | INTRAVENOUS | Status: AC | PRN
  Administered 2015-05-26: 500 [IU]
  Filled 2015-05-26: qty 5

## 2015-05-26 MED ORDER — SODIUM CHLORIDE 0.9 % IJ SOLN
10.0000 mL | INTRAMUSCULAR | Status: DC | PRN
  Administered 2015-05-26: 10 mL
  Filled 2015-05-26: qty 10

## 2015-05-26 MED ORDER — SODIUM CHLORIDE 0.9 % IV SOLN
Freq: Once | INTRAVENOUS | Status: AC
  Administered 2015-05-26: 11:00:00 via INTRAVENOUS

## 2015-05-26 MED ORDER — SODIUM CHLORIDE 0.9 % IV SOLN
611.5000 mg | Freq: Once | INTRAVENOUS | Status: AC
  Administered 2015-05-26: 610 mg via INTRAVENOUS
  Filled 2015-05-26: qty 61

## 2015-05-26 MED ORDER — PALONOSETRON HCL INJECTION 0.25 MG/5ML
0.2500 mg | Freq: Once | INTRAVENOUS | Status: AC
  Administered 2015-05-26: 0.25 mg via INTRAVENOUS
  Filled 2015-05-26: qty 5

## 2015-05-26 MED ORDER — SODIUM CHLORIDE 0.9 % IV SOLN
750.0000 mg/m2 | Freq: Once | INTRAVENOUS | Status: AC
  Administered 2015-05-26: 1900 mg via INTRAVENOUS
  Filled 2015-05-26: qty 49.97

## 2015-05-26 MED ORDER — DEXAMETHASONE SODIUM PHOSPHATE 100 MG/10ML IJ SOLN
10.0000 mg | Freq: Once | INTRAMUSCULAR | Status: AC
  Administered 2015-05-26: 10 mg via INTRAVENOUS
  Filled 2015-05-26: qty 1

## 2015-05-26 NOTE — Progress Notes (Signed)
Tolerated chemo well. ambulatory on discharge home with family.

## 2015-05-26 NOTE — Patient Instructions (Addendum)
[place] at [facility] Discharge Instructions  RECOMMENDATIONS MADE BY THE CONSULTANT AND ANY TEST RESULTS WILL BE SENT TO YOUR REFERRING PHYSICIAN.  Be sure to continue taking two of the omeprazole, total 80mg , daily for acid reflux.  Call us with any further or worsening chest pain.   We are going to start [facility] for your bones today.   We are going to do an EKG today after treatment.  Please continue to be as active as possible.   You will not get the Neulasta OnPro today for your white cell count, or next week.       Thank you for choosing [place] at [facility] to provide your oncology and hematology care.  To afford each patient quality time with our provider, please arrive at least 15 minutes before your scheduled appointment time.    You need to re-schedule your appointment should you arrive 10 or more minutes late.  We strive to give you quality time with our providers, and arriving late affects you and other patients whose appointments are after yours.  Also, if you no show three or more times for appointments you may be dismissed from the clinic at the providers discretion.     Again, thank you for choosing [facility].  Our hope is that these requests will decrease the amount of time that you wait before being seen by our physicians.       _____________________________________________________________  Should you have questions after your visit to [facility], please contact our office at [phone] between the hours of 8:30 a.m. and 4:30 p.m.  Voicemails left after 4:30 p.m. will not be returned until the following business day.  For prescription refill requests, have your pharmacy contact our office.

## 2015-05-26 NOTE — Progress Notes (Signed)
[name], MD [place] [address]  No diagnosis found.  CURRENT THERAPY: Carboplatin/Gemcitabine in a day 1, 8 every 21 day fashion  INTERVAL HISTORY: Harry Franklin 75 y.o. 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    Metastatic urothelial carcinoma ([place])   04/02/2015 Imaging CT abd/pelvis- Innumberable hepatic masses, B/L adrenal mets, abdominopelvic lymphadenopathy, LLL lung lesion 6 mm, Lytic lesion of right L2 vertebral bosy, circumferential wall thickening of lower thoracic esophagus, mild diffuse bladder wall thickening.   04/02/2015 Pathology Results Urine Cytology- cells present suspicious for malignancy.  Suspicious for high-grade urothelial carcinoma.   04/08/2015 Procedure US guided biopsy of hepatic lesion   04/08/2015 Pathology Results Liver, needle/core biopsy, right - METASTATIC HIGH GRADE CARCINOMA,   04/14/2015 -  Chemotherapy Carboplatin/Gemcitabine Days 1, 8 every 21 days   05/25/2015 Imaging CT with interval response to therapy, improvement in multifocal liver mets, improvement in enlarged abdominal and pelvic adenopahty and bilateral adrenal gland metastatese, stable appearance of lytic lesion in L2    Harry Franklin returns to the [place] today accompanied by his daughter and several other family members. He is seated back in a recliner preparing to receive treatment.  He says he just really started back to walking after December 4th. He says, in terms of general well-being, "I'm doing great all the time now," but keeps referring back to December 4th. He says things started getting better since December 4th and that things have been continuously getting better.  He states that he's been doing circuit training and taking exercise courses for a year and a half, but that recently, his left arm and  his chest have been hurting. He remarks that when they took his blood pressure today, he noticed that his L arm got numb. With regards to this, he says he's never had heart problems before; "this all" started happening ever since he started chemotherapy and the "drug they give me in my arm," the Onpro. He says when he moves his left arm, he gets a pain in it, "like a mscle soreness maybe." He reports that his L arm is sore today. Harry Franklin says that he thinks that this pain started with the "general" chemotherapy, but seems to be worse or more dramatic "with the other."  On a related note, he reports that now he gets chest pain when he's walking. He adds that, if he takes an acid reflux pill or a generic tums, it goes away. Harry Franklin remarks that these symptoms started "when all this mess started." He says he doesn't get chest pain every time he walks, and reports that this pain occurs in the "middle" of his chest. Whenever it happens, he says that he gives himself "the talk test" to see whether he can carry on a conversation, to make sure he's not out of breath. Each time he experiences this chest pain, he's breathing heavily, but not out of breath. Thanks to the "talk test," he remarks, "I can still talk." In terms of the spread and radiation of the pain in general. he says "it's more of a sharp pain, I reckon."  When asked if he's burping and belching more recently, he says "oh yeah." He's experienced acid reflux for a while, and is currently on 40 mg omeprazole twice a day to treat it. He says he initially resisted this  medication since he'd "heard bad things," but he knew that the chemo could make his heartburn a lot worse, so that's why he started taking it.  He says that on Christmas day, all of these symptoms were "really bad," to the point where he was worried he might "mess up Christmas day." Fortunately, Christmas went well.   Lately he says he's been eating well. His family reports that  he ate well on Christmas Day; he says he eats too much these days, as evidenced by his weight gain. His daughter gets teary-eyed remarking that he couldn't eat a whole supper meal before he started chemo, which was very unlike him and "wasn't her dad."  He says that his bowels have been softer recently, which is good, as they used to be extremely solid "to the point of constipation." They are still solid, but softer. He confirms that he has control over his bowels.   Past Medical History  Diagnosis Date  . GERD (gastroesophageal reflux disease)   . Hypokalemia   . Hypertension   . Cataract   . Arthritis   . Macular degeneration, age related   . DVT (deep venous thrombosis) ([place])     developed after traveling x 1, developed another after left foot surgery  . Hydrocele in adult   . Metastatic urothelial carcinoma ([place]) 04/12/2015    has Dysphagia; Encounter for screening colonoscopy; Dysphagia, pharyngoesophageal phase; Reflux esophagitis; Peptic stricture of esophagus; Hiatal hernia; Abdominal pain; and Metastatic urothelial carcinoma ([place]) on his problem list.     is allergic to rabeprazole and vancomycin.  Current Outpatient Prescriptions on File Prior to Visit  Medication Sig Dispense Refill  . aspirin 325 MG tablet Take 325 mg by mouth daily.    . Calcium Carb-Cholecalciferol 600-500 MG-UNIT CAPS Take 1 capsule by mouth 2 (two) times daily.    . calcium carbonate (TUMS EX) 750 MG chewable tablet Chew 2 tablets by mouth 2 (two) times daily as needed for heartburn.    Marland Kitchen CARBOPLATIN IV Inject into the vein. Day 1 every 21 days    . Gemcitabine HCl (GEMZAR IV) Inject into the vein. Days 1, 8 every 21 days    . lidocaine-prilocaine (EMLA) cream Apply a quarter size amount to port site 1 hour prior to chemo. Do not rub in. Cover with plastic wrap. 30 g 3  . metoprolol (LOPRESSOR) 100 MG tablet Take 100 mg by mouth at bedtime.     . Multiple Vitamins-Minerals (PRESERVISION AREDS 2 PO)  Take 1 tablet by mouth 2 (two) times daily.    Marland Kitchen omeprazole (PRILOSEC) 40 MG capsule Take 40 mg by mouth 2 (two) times daily.     Marland Kitchen dexamethasone (DECADRON) 4 MG tablet Take 2 tablets twice a day x 3 days. Then take 1 tablet twice a day x 3 days. Then take 1 tablet daily x 3 days. (Patient not taking: Reported on 05/26/2015) 30 tablet 0  . ondansetron (ZOFRAN) 8 MG tablet Take 1 tablet (8 mg total) by mouth every 8 (eight) hours as needed for nausea or vomiting. (Patient not taking: Reported on 05/26/2015) 30 tablet 2  . oxyCODONE (OXY IR/ROXICODONE) 5 MG immediate release tablet Take 1-2 tablets (5-10 mg total) by mouth every 4 (four) hours as needed for severe pain. (Patient not taking: Reported on 04/23/2015) 60 tablet 0  . prochlorperazine (COMPAZINE) 10 MG tablet Take 1 tablet (10 mg total) by mouth every 6 (six) hours as needed for nausea or vomiting. (Patient not  taking: Reported on 04/23/2015) 30 tablet 2  . ranitidine (ZANTAC) 150 MG capsule Take 150 mg by mouth 2 (two) times daily. Reported on 05/26/2015     Current Facility-Administered Medications on File Prior to Visit  Medication Dose Route Frequency Provider Last Rate Last Dose  . sodium chloride 0.9 % injection 10 mL  10 mL Intracatheter PRN Patrici Ranks, MD   10 mL at 05/26/15 1034    Past Surgical History  Procedure Laterality Date  . Replacement total knee    . Appendectomy    . Hammer toe surgery Left   . Lapidus procedure    . Hallux fusion    . Colonoscopy  03/31/05  . Colonoscopy N/A 10/02/2014    [id number] diverticulosis  . Esophagogastroduodenoscopy N/A 10/02/2014    [id number] ulcerative reflux/s/p dilation/large HH  . Maloney dilation N/A 10/02/2014    Procedure: Venia Minks DILATION;  Surgeon: Daneil Dolin, MD;  [facility];  Service: Endoscopy;  Laterality: N/A;  . Esophagogastroduodenoscopy N/A 01/29/2015    Procedure: ESOPHAGOGASTRODUODENOSCOPY (EGD);  Surgeon: Daneil Dolin, MD;  Location: [facility];  Service: Endoscopy;  Laterality: N/A;  0800-rescheduled to 9/9 @ 915 [place] notified pt  . Esophageal dilation  01/29/2015    Procedure: ESOPHAGEAL DILATION;  Surgeon: Daneil Dolin, MD;  Location: [facility];  Service: Endoscopy;;  . Portacath placement Left 04/13/15    REVIEW OF SYSTEMS  Positive for L arm pain, chest pain, note L arm pain described as muscular Denies any headaches, dizziness, double vision, fevers, chills, night sweats, nausea, vomiting, diarrhea, constipation, chest pain, heart palpitations, shortness of breath, blood in stool, black tarry stool, urinary pain, urinary burning, urinary frequency, hematuria.  14 point review of systems was performed and is negative except as detailed under history of present illness and above     PHYSICAL EXAMINATION  ECOG PERFORMANCE STATUS: 0 - Asymptomatic  Filed Vitals:   05/26/15 0851  BP: 131/67  Pulse: 79  Temp: 98.1 F (36.7 C)  Resp: 20    GENERAL:alert, no distress, well nourished, well developed, comfortable, cooperative, smiling and accompanied by family. SKIN: skin color, texture, turgor are normal, no rashes or significant lesions HEAD: Normocephalic, No masses, lesions, tenderness or abnormalities EYES: normal, EOMI, Conjunctiva are pink and non-injected EARS: External ears normal OROPHARYNX:lips, buccal mucosa, and tongue normal and mucous membranes are moist  NECK: supple, no stridor, trachea midline LYMPH:  no palpable lymphadenopathy, no hepatosplenomegaly BREAST:not examined LUNGS: clear to auscultation and percussion HEART: regular rate & rhythm ABDOMEN:abdomen soft, non-tender, normal bowel sounds and no masses or organomegaly BACK: Back symmetric, no curvature. EXTREMITIES:less then 2 second capillary refill, no edema, no skin discoloration, no clubbing, no cyanosis  NEURO: alert & oriented x 3 with fluent speech, no focal motor/sensory deficits, gait normal   LABORATORY DATA: I have  reviewed the data as listed CBC    Component Value Date/Time   WBC 6.8 05/26/2015 0849   RBC 3.46* 05/26/2015 0849   HGB 10.7* 05/26/2015 0849   HCT 32.9* 05/26/2015 0849   PLT 217 05/26/2015 0849   MCV 95.1 05/26/2015 0849   MCH 30.9 05/26/2015 0849   MCHC 32.5 05/26/2015 0849   RDW 17.0* 05/26/2015 0849   LYMPHSABS 1.1 05/26/2015 0849   MONOABS 0.9 05/26/2015 0849   EOSABS 0.1 05/26/2015 0849   BASOSABS 0.0 05/26/2015 0849      Chemistry      Component Value Date/Time   NA  140 05/26/2015 0849   K 4.0 05/26/2015 0849   CL 107 05/26/2015 0849   CO2 26 05/26/2015 0849   BUN 14 05/26/2015 0849   CREATININE 1.18 05/26/2015 0849      Component Value Date/Time   CALCIUM 8.4* 05/26/2015 0849   ALKPHOS 141* 05/26/2015 0849   AST 28 05/26/2015 0849   ALT 31 05/26/2015 0849   BILITOT 0.6 05/26/2015 0849      PENDING LABS:   RADIOGRAPHIC STUDIES:  Ct Chest W Contrast  05/25/2015  CLINICAL DATA:  Restaging metastatic urothelial carcinoma EXAM: CT CHEST, ABDOMEN, AND PELVIS WITH CONTRAST TECHNIQUE: Multidetector CT imaging of the chest, abdomen and pelvis was performed following the standard protocol during bolus administration of intravenous contrast. CONTRAST:  1110mL OMNIPAQUE IOHEXOL 300 MG/ML  SOLN COMPARISON:  CT Abdomen and pelvis from 04/02/2015 FINDINGS: CT CHEST Mediastinum: The heart size appears within normal limits. No pericardial effusion. There is aortic atherosclerosis noted. Calcifications within the RCA, LAD and left circumflex coronary artery noted. The esophagus appears within normal limits. There is a sub- carinal node which measures 1 cm, image 32 of series 2. Right hilar node measures 9 mm, image 26 of series 2. No axillary or supraclavicular adenopathy. Lungs/Pleura: There is no pleural effusion. Moderate changes of paraseptal emphysema identified. Biapical scar like densities are noted. No suspicious pulmonary nodule or mass identified. Musculoskeletal:  Spondylosis noted within the thoracic spine. No aggressive lytic or sclerotic bone lesions noted. CT ABDOMEN AND PELVIS Hepatobiliary: Liver metastasis adjacent to the gallbladder fossa measures 2.5 x 2.4 cm, image number 69 of series 2. Previously 3.2 x 2.4 cm. The metastasis along the undersurface of the dome of liver measures 2 x 2.2 cm, image 57 of series 3. Previously this lesion measured 2.9 x 3.4 cm. The other smaller liver metastasis are less conspicuous on the current study. Gallbladder is negative. No biliary dilatation. Pancreas: Unremarkable appearance of the pancreas. Spleen: The spleen is negative. Adrenals/Urinary Tract: The metastasis involving the left adrenal gland measures 3.4 by 3.7 cm, image 71 of series 2. Previously 5.2 x 5.3 cm. Right adrenal gland metastasis has resolved. Unremarkable appearance of the kidneys. The urinary bladder appears normal. Stomach/Bowel: Small hiatal hernia. The small bowel loops have a normal course and caliber. No obstruction. Unremarkable appearance of the proximal colon. Distal colonic diverticula noted without acute inflammation. Vascular/Lymphatic: Calcified atherosclerotic disease involves the abdominal aorta. No aneurysm. No enlarged retroperitoneal or mesenteric adenopathy. The index periaortic lymph node measures 1.2 cm, image 81 of series 2. Previously 2.0 cm. Index portacaval lymph node measures 2.5 cm, image 70 of series 2. Previously 3.6 cm. The index retrocaval node measures 1 cm, image 79 of series 2. Previously 1.7 cm. Index right external iliac lymph node measures 0.7 cm, image 121 of series 2. Previously 1.6 cm. Index left external iliac lymph node measures 1 cm, image 119 of series 2. Previously 1.2 cm. Reproductive: Prostate gland and seminal vesicles are unremarkable. Other: There is a left inguinal hernia which contains fat only. Musculoskeletal: Spondylosis noted within the thoracic and lumbar spine. Index lytic lesion involving large  metastasis is involving the L2 vertebra is again noted. This appears similar to the previous exam, image 66 of series 4. There is associated superior and inferior endplate fractures. Advanced spondylosis is identified throughout the lumbar spine with facet hypertrophy and degenerative change. No new lytic lesions identified. IMPRESSION: 1. Interval response to therapy. 2. Improvement in multi focal liver metastasis. 3. Improvement an  enlarged abdominal and pelvic adenopathy 4. Interval improvement an bilateral adrenal gland metastasis. 5. Stable appearance of lytic lesion involving the L2 vertebra. 6. Aortic atherosclerosis and multi vessel coronary artery calcification. Electronically Signed   By: Kerby Moors M.D.   On: 05/25/2015 13:03   Ct Abdomen Pelvis W Contrast  05/25/2015  CLINICAL DATA:  Restaging metastatic urothelial carcinoma EXAM: CT CHEST, ABDOMEN, AND PELVIS WITH CONTRAST TECHNIQUE: Multidetector CT imaging of the chest, abdomen and pelvis was performed following the standard protocol during bolus administration of intravenous contrast. CONTRAST:  173mL OMNIPAQUE IOHEXOL 300 MG/ML  SOLN COMPARISON:  CT Abdomen and pelvis from 04/02/2015 FINDINGS: CT CHEST Mediastinum: The heart size appears within normal limits. No pericardial effusion. There is aortic atherosclerosis noted. Calcifications within the RCA, LAD and left circumflex coronary artery noted. The esophagus appears within normal limits. There is a sub- carinal node which measures 1 cm, image 32 of series 2. Right hilar node measures 9 mm, image 26 of series 2. No axillary or supraclavicular adenopathy. Lungs/Pleura: There is no pleural effusion. Moderate changes of paraseptal emphysema identified. Biapical scar like densities are noted. No suspicious pulmonary nodule or mass identified. Musculoskeletal: Spondylosis noted within the thoracic spine. No aggressive lytic or sclerotic bone lesions noted. CT ABDOMEN AND PELVIS Hepatobiliary:  Liver metastasis adjacent to the gallbladder fossa measures 2.5 x 2.4 cm, image number 69 of series 2. Previously 3.2 x 2.4 cm. The metastasis along the undersurface of the dome of liver measures 2 x 2.2 cm, image 57 of series 3. Previously this lesion measured 2.9 x 3.4 cm. The other smaller liver metastasis are less conspicuous on the current study. Gallbladder is negative. No biliary dilatation. Pancreas: Unremarkable appearance of the pancreas. Spleen: The spleen is negative. Adrenals/Urinary Tract: The metastasis involving the left adrenal gland measures 3.4 by 3.7 cm, image 71 of series 2. Previously 5.2 x 5.3 cm. Right adrenal gland metastasis has resolved. Unremarkable appearance of the kidneys. The urinary bladder appears normal. Stomach/Bowel: Small hiatal hernia. The small bowel loops have a normal course and caliber. No obstruction. Unremarkable appearance of the proximal colon. Distal colonic diverticula noted without acute inflammation. Vascular/Lymphatic: Calcified atherosclerotic disease involves the abdominal aorta. No aneurysm. No enlarged retroperitoneal or mesenteric adenopathy. The index periaortic lymph node measures 1.2 cm, image 81 of series 2. Previously 2.0 cm. Index portacaval lymph node measures 2.5 cm, image 70 of series 2. Previously 3.6 cm. The index retrocaval node measures 1 cm, image 79 of series 2. Previously 1.7 cm. Index right external iliac lymph node measures 0.7 cm, image 121 of series 2. Previously 1.6 cm. Index left external iliac lymph node measures 1 cm, image 119 of series 2. Previously 1.2 cm. Reproductive: Prostate gland and seminal vesicles are unremarkable. Other: There is a left inguinal hernia which contains fat only. Musculoskeletal: Spondylosis noted within the thoracic and lumbar spine. Index lytic lesion involving large metastasis is involving the L2 vertebra is again noted. This appears similar to the previous exam, image 66 of series 4. There is associated  superior and inferior endplate fractures. Advanced spondylosis is identified throughout the lumbar spine with facet hypertrophy and degenerative change. No new lytic lesions identified. IMPRESSION: 1. Interval response to therapy. 2. Improvement in multi focal liver metastasis. 3. Improvement an enlarged abdominal and pelvic adenopathy 4. Interval improvement an bilateral adrenal gland metastasis. 5. Stable appearance of lytic lesion involving the L2 vertebra. 6. Aortic atherosclerosis and multi vessel coronary artery calcification. Electronically Signed  By: Kerby Moors M.D.   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 The patient was advised to notify me if pain worsens or increases intensity.  We are going to obtain an EKG from his PCP for comparison.  We are going to hold off on Onpro after Day 8 of chemotherapy next week.  He was encouraged to continue his PPI twice daily.    I advised him if he developed persistent chest heaviness during exertion i want him to go to the ED, he is agreeable.  I reviewed his CT scans in detail.  We will proceed with cycle #3 today.   I am going to add XGEVA given his metastatic disease to bone. We discussed risks and benefits of this medication in detail today and he wishes to proceed. Supportive therapy plan was built.  I will see him back next week.  All questions were answered. The patient knows to call the clinic with any problems, questions or concerns. We can certainly see the patient much sooner if necessary.  This document serves as a record of services personally performed by Ancil Linsey, MD. It was created on her behalf by Toni Amend, a trained medical scribe.  The creation of this record is based on the scribe's personal observations and the provider's statements to them. This document has been checked and approved by the attending provider.  I have reviewed the above documentation for accuracy and completeness, and I agree with the above.  This note is electronically signed by: Molli Hazard, MD 05/26/2015 3:32 PM

## 2015-05-26 NOTE — Patient Instructions (Signed)
[facility] Discharge Instructions for Patients Receiving Chemotherapy  Today you received the following chemotherapy agents Carbo and Gemzar. To help prevent nausea and vomiting after your treatment, we encourage you to take your nausea medication as instructed.  If you develop nausea and vomiting that is not controlled by your nausea medication, call the clinic. If it is after clinic hours your family physician or the after hours number for the clinic or go to the Emergency Department. BELOW ARE SYMPTOMS THAT SHOULD BE REPORTED IMMEDIATELY:  *FEVER GREATER THAN 101.0 F  *CHILLS WITH OR WITHOUT FEVER  NAUSEA AND VOMITING THAT IS NOT CONTROLLED WITH YOUR NAUSEA MEDICATION  *UNUSUAL SHORTNESS OF BREATH  *UNUSUAL BRUISING OR BLEEDING  TENDERNESS IN MOUTH AND THROAT WITH OR WITHOUT PRESENCE OF ULCERS  *URINARY PROBLEMS  *BOWEL PROBLEMS  UNUSUAL RASH Items with * indicate a potential emergency and should be followed up as soon as possible.  Return as scheduled.  I have been informed and understand all the instructions given to me. I know to contact the clinic, my physician, or go to the Emergency Department if any problems should occur. I do not have any questions at this time, but understand that I may call the clinic during office hours or the Patient Navigator at [phone] should I have any questions or need assistance in obtaining follow up care.    __________________________________________  _____________  __________ Signature of Patient or Authorized Representative            Date                   Time    __________________________________________ Nurse's Signature  

## 2015-05-30 NOTE — Assessment & Plan Note (Addendum)
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  Oncology history is up-to-date.  Today is Day 8 of Cycle #3.    He was seen last week and noted chest pain.  EKG was performed and reviewed.  No acute changes noted.  This was compared to his 2014 EKG performed at [facility] by his primary care provider.  As a result, Onpro is planned to be held today to see if this is contributing to this pain.  Neulasta is deleted from the treatment plan.  Delton See is ordered and can be started once insurance approves this medication for reduction of SRE.  Delton See was recently approved thorugh his insurance.  Therefore, we will start the medication today.  I reviewed again, the risks, benefits, alternatives, and side effects of Denosumab including hypocalcemia and ONJ.  He knows to let his dentist know he is on Denosumab prior to any dental procedure.  He is to let us know prior to any elective oral surgery so we can hold the medication appropriately.  He is to consume Ca++ 1200 mg and Vit D 1000 units daily to prevent hypocalcemia.  He is already on appropriate dosing of Ca++ and Vit D and he confirms compliance.  He notes resolution of his chest discomfort when he increased his PPI to twice daily.  He was fearful of the possible risks associated with that increased dose.  He called the clinic the other day reporting intermittent LUQ abdominal pain.  He reports "it is there one day and gone the next" without any intervention.  I have encouraged him to use his pain medication for pain control as needed.  He is to let us know if pain is present, progressive, and persistent.    Despite his response to therapy with CT imaging in Jan 2017, he is educated that his stage of disease does not change.  Return for cycle #4 on 06/16/2015 and follow-up appointment with pre-treatment labs as ordered.

## 2015-05-30 NOTE — Progress Notes (Signed)
Harry Inches, MD [address] [place] [address]  Metastatic urothelial carcinoma [facility])  CURRENT THERAPY: Carboplatin/Gemcitabine in a day 1, 8 every 21 day fashion  INTERVAL HISTORY: Harry Franklin 75 y.o. 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    Metastatic urothelial carcinoma ([place])   04/02/2015 Imaging CT abd/pelvis- Innumberable hepatic masses, B/L adrenal mets, abdominopelvic lymphadenopathy, LLL lung lesion 6 mm, Lytic lesion of right L2 vertebral bosy, circumferential wall thickening of lower thoracic esophagus, mild diffuse bladder wall thickening.   04/02/2015 Pathology Results Urine Cytology- cells present suspicious for malignancy.  Suspicious for high-grade urothelial carcinoma.   04/08/2015 Procedure US guided biopsy of hepatic lesion   04/08/2015 Pathology Results Liver, needle/core biopsy, right - METASTATIC HIGH GRADE CARCINOMA,   04/14/2015 -  Chemotherapy Carboplatin/Gemcitabine Days 1, 8 every 21 days   05/25/2015 Imaging CT with interval response to therapy, improvement in multifocal liver mets, improvement in enlarged abdominal and pelvic adenopahty and bilateral adrenal gland metastatese, stable appearance of lytic lesion in L2   06/02/2015 Miscellaneous Xgeva to reduce the risk of SLE.  120 mg SQ every month.    I personally reviewed and went over laboratory results with the patient.  The results are noted within this dictation.  Labs will be updated today.    He reports improvement of his chest discomfort with the increase in his PPI.  He was hesitant to increase this as he heard of from other people the possible effects of increasing this medication.  However, he did start taking his PPI BID and symptomatically, he is improved.  He notes LUQ abdominal pain that comes and goes.  He denies any fever or chills  associated with this pain.  He notes that it resolves without any intervention.  He denies any pain today.  He is [place] trip [place].  We will accommodate this trip with regards to his treatment plan as we approach that date.  Past Medical History  Diagnosis Date  . GERD (gastroesophageal reflux disease)   . Hypokalemia   . Hypertension   . Cataract   . Arthritis   . Macular degeneration, age related   . DVT (deep venous thrombosis) ([place])     developed after traveling x 1, developed another after left foot surgery  . Hydrocele in adult   . Metastatic urothelial carcinoma ([place]) 04/12/2015    has Dysphagia; Encounter for screening colonoscopy; Dysphagia, pharyngoesophageal phase; Reflux esophagitis; Peptic stricture of esophagus; Hiatal hernia; Abdominal pain; and Metastatic urothelial carcinoma ([place]) on his problem list.     is allergic to rabeprazole and vancomycin.  Current Outpatient Prescriptions on File Prior to Visit  Medication Sig Dispense Refill  . aspirin 325 MG tablet Take 325 mg by mouth daily.    . Calcium Carb-Cholecalciferol 600-500 MG-UNIT CAPS Take 1 capsule by mouth 2 (two) times daily.    . calcium carbonate (TUMS EX) 750 MG chewable tablet Chew 2 tablets by mouth 2 (two) times daily as needed for heartburn.    Marland Kitchen CARBOPLATIN IV Inject into the vein. Day 1 every 21 days    . Gemcitabine HCl (GEMZAR IV) Inject into the vein. Days 1, 8 every 21 days    . lidocaine-prilocaine (EMLA) cream Apply a quarter size amount to port site 1 hour prior to chemo. Do not rub in.  Cover with plastic wrap. 30 g 3  . metoprolol (LOPRESSOR) 100 MG tablet Take 100 mg by mouth at bedtime.     . Multiple Vitamins-Minerals (PRESERVISION AREDS 2 PO) Take 1 tablet by mouth 2 (two) times daily.    Marland Kitchen omeprazole (PRILOSEC) 40 MG capsule Take 40 mg by mouth 2 (two) times daily.     . ondansetron (ZOFRAN) 8 MG tablet Take 1 tablet (8 mg total) by mouth every 8 (eight)  hours as needed for nausea or vomiting. (Patient not taking: Reported on 05/26/2015) 30 tablet 2  . oxyCODONE (OXY IR/ROXICODONE) 5 MG immediate release tablet Take 1-2 tablets (5-10 mg total) by mouth every 4 (four) hours as needed for severe pain. (Patient not taking: Reported on 04/23/2015) 60 tablet 0  . prochlorperazine (COMPAZINE) 10 MG tablet Take 1 tablet (10 mg total) by mouth every 6 (six) hours as needed for nausea or vomiting. (Patient not taking: Reported on 04/23/2015) 30 tablet 2  . ranitidine (ZANTAC) 150 MG capsule Take 150 mg by mouth 2 (two) times daily. Reported on 05/26/2015     No current facility-administered medications on file prior to visit.    Past Surgical History  Procedure Laterality Date  . Replacement total knee    . Appendectomy    . Hammer toe surgery Left   . Lapidus procedure    . Hallux fusion    . Colonoscopy  03/31/05  . Colonoscopy N/A 10/02/2014    [id number] diverticulosis  . Esophagogastroduodenoscopy N/A 10/02/2014    [id number] ulcerative reflux/s/p dilation/large HH  . Maloney dilation N/A 10/02/2014    Procedure: Venia Minks DILATION;  Surgeon: Daneil Dolin, MD;  [facility];  Service: Endoscopy;  Laterality: N/A;  . Esophagogastroduodenoscopy N/A 01/29/2015    Procedure: ESOPHAGOGASTRODUODENOSCOPY (EGD);  Surgeon: Daneil Dolin, MD;  Location: [facility];  Service: Endoscopy;  Laterality: N/A;  0800-rescheduled to 9/9 @ 915 [place] notified pt  . Esophageal dilation  01/29/2015    Procedure: ESOPHAGEAL DILATION;  Surgeon: Daneil Dolin, MD;  Location: [facility];  Service: Endoscopy;;  . Portacath placement Left 04/13/15    Denies any headaches, dizziness, double vision, fevers, chills, night sweats, nausea, vomiting, diarrhea, constipation, chest pain, heart palpitations, shortness of breath, blood in stool, black tarry stool, urinary pain, urinary burning, urinary frequency, hematuria.   PHYSICAL EXAMINATION  ECOG PERFORMANCE  STATUS: 0 - Asymptomatic  Filed Vitals:   06/02/15 0914  BP: 143/77  Pulse: 87  Temp: 97.8 F (36.6 C)  Resp: 20    GENERAL:alert, no distress, well nourished, well developed, comfortable, cooperative, smiling and accompanied by family. SKIN: skin color, texture, turgor are normal, no rashes or significant lesions HEAD: Normocephalic, No masses, lesions, tenderness or abnormalities EYES: normal, EOMI, Conjunctiva are pink and non-injected EARS: External ears normal OROPHARYNX:lips, buccal mucosa, and tongue normal and mucous membranes are moist  NECK: supple, no stridor, trachea midline LYMPH:  no palpable lymphadenopathy, no hepatosplenomegaly BREAST:not examined LUNGS: clear to auscultation and percussion HEART: regular rate & rhythm ABDOMEN:abdomen soft, non-tender, normal bowel sounds and no masses or organomegaly BACK: Back symmetric, no curvature. EXTREMITIES:less then 2 second capillary refill, no edema, no skin discoloration, no clubbing, no cyanosis  NEURO: alert & oriented x 3 with fluent speech, no focal motor/sensory deficits, gait normal   LABORATORY DATA: CBC    Component Value Date/Time   WBC 4.2 06/02/2015 0629   RBC 3.02* 06/02/2015 0629   HGB 9.4* 06/02/2015 [id number]  HCT 28.7* 06/02/2015 0629   PLT 307 06/02/2015 0629   MCV 95.0 06/02/2015 0629   MCH 31.1 06/02/2015 0629   MCHC 32.8 06/02/2015 0629   RDW 16.8* 06/02/2015 0629   LYMPHSABS 0.8 06/02/2015 0629   MONOABS 0.3 06/02/2015 0629   EOSABS 0.1 06/02/2015 0629   BASOSABS 0.0 06/02/2015 0629      Chemistry      Component Value Date/Time   NA 140 05/26/2015 0849   K 4.0 05/26/2015 0849   CL 107 05/26/2015 0849   CO2 26 05/26/2015 0849   BUN 14 05/26/2015 0849   CREATININE 1.18 05/26/2015 0849      Component Value Date/Time   CALCIUM 8.4* 05/26/2015 0849   ALKPHOS 141* 05/26/2015 0849   AST 28 05/26/2015 0849   ALT 31 05/26/2015 0849   BILITOT 0.6 05/26/2015 0849        PENDING  LABS:   RADIOGRAPHIC STUDIES:  Ct Chest W Contrast  05/25/2015  CLINICAL DATA:  Restaging metastatic urothelial carcinoma EXAM: CT CHEST, ABDOMEN, AND PELVIS WITH CONTRAST TECHNIQUE: Multidetector CT imaging of the chest, abdomen and pelvis was performed following the standard protocol during bolus administration of intravenous contrast. CONTRAST:  152mL OMNIPAQUE IOHEXOL 300 MG/ML  SOLN COMPARISON:  CT Abdomen and pelvis from 04/02/2015 FINDINGS: CT CHEST Mediastinum: The heart size appears within normal limits. No pericardial effusion. There is aortic atherosclerosis noted. Calcifications within the RCA, LAD and left circumflex coronary artery noted. The esophagus appears within normal limits. There is a sub- carinal node which measures 1 cm, image 32 of series 2. Right hilar node measures 9 mm, image 26 of series 2. No axillary or supraclavicular adenopathy. Lungs/Pleura: There is no pleural effusion. Moderate changes of paraseptal emphysema identified. Biapical scar like densities are noted. No suspicious pulmonary nodule or mass identified. Musculoskeletal: Spondylosis noted within the thoracic spine. No aggressive lytic or sclerotic bone lesions noted. CT ABDOMEN AND PELVIS Hepatobiliary: Liver metastasis adjacent to the gallbladder fossa measures 2.5 x 2.4 cm, image number 69 of series 2. Previously 3.2 x 2.4 cm. The metastasis along the undersurface of the dome of liver measures 2 x 2.2 cm, image 57 of series 3. Previously this lesion measured 2.9 x 3.4 cm. The other smaller liver metastasis are less conspicuous on the current study. Gallbladder is negative. No biliary dilatation. Pancreas: Unremarkable appearance of the pancreas. Spleen: The spleen is negative. Adrenals/Urinary Tract: The metastasis involving the left adrenal gland measures 3.4 by 3.7 cm, image 71 of series 2. Previously 5.2 x 5.3 cm. Right adrenal gland metastasis has resolved. Unremarkable appearance of the kidneys. The urinary  bladder appears normal. Stomach/Bowel: Small hiatal hernia. The small bowel loops have a normal course and caliber. No obstruction. Unremarkable appearance of the proximal colon. Distal colonic diverticula noted without acute inflammation. Vascular/Lymphatic: Calcified atherosclerotic disease involves the abdominal aorta. No aneurysm. No enlarged retroperitoneal or mesenteric adenopathy. The index periaortic lymph node measures 1.2 cm, image 81 of series 2. Previously 2.0 cm. Index portacaval lymph node measures 2.5 cm, image 70 of series 2. Previously 3.6 cm. The index retrocaval node measures 1 cm, image 79 of series 2. Previously 1.7 cm. Index right external iliac lymph node measures 0.7 cm, image 121 of series 2. Previously 1.6 cm. Index left external iliac lymph node measures 1 cm, image 119 of series 2. Previously 1.2 cm. Reproductive: Prostate gland and seminal vesicles are unremarkable. Other: There is a left inguinal hernia which contains fat only. Musculoskeletal:  Spondylosis noted within the thoracic and lumbar spine. Index lytic lesion involving large metastasis is involving the L2 vertebra is again noted. This appears similar to the previous exam, image 66 of series 4. There is associated superior and inferior endplate fractures. Advanced spondylosis is identified throughout the lumbar spine with facet hypertrophy and degenerative change. No new lytic lesions identified. IMPRESSION: 1. Interval response to therapy. 2. Improvement in multi focal liver metastasis. 3. Improvement an enlarged abdominal and pelvic adenopathy 4. Interval improvement an bilateral adrenal gland metastasis. 5. Stable appearance of lytic lesion involving the L2 vertebra. 6. Aortic atherosclerosis and multi vessel coronary artery calcification. Electronically Signed   By: Kerby Moors M.D.   On: 05/25/2015 13:03   Ct Abdomen Pelvis W Contrast  05/25/2015  CLINICAL DATA:  Restaging metastatic urothelial carcinoma EXAM: CT  CHEST, ABDOMEN, AND PELVIS WITH CONTRAST TECHNIQUE: Multidetector CT imaging of the chest, abdomen and pelvis was performed following the standard protocol during bolus administration of intravenous contrast. CONTRAST:  18mL OMNIPAQUE IOHEXOL 300 MG/ML  SOLN COMPARISON:  CT Abdomen and pelvis from 04/02/2015 FINDINGS: CT CHEST Mediastinum: The heart size appears within normal limits. No pericardial effusion. There is aortic atherosclerosis noted. Calcifications within the RCA, LAD and left circumflex coronary artery noted. The esophagus appears within normal limits. There is a sub- carinal node which measures 1 cm, image 32 of series 2. Right hilar node measures 9 mm, image 26 of series 2. No axillary or supraclavicular adenopathy. Lungs/Pleura: There is no pleural effusion. Moderate changes of paraseptal emphysema identified. Biapical scar like densities are noted. No suspicious pulmonary nodule or mass identified. Musculoskeletal: Spondylosis noted within the thoracic spine. No aggressive lytic or sclerotic bone lesions noted. CT ABDOMEN AND PELVIS Hepatobiliary: Liver metastasis adjacent to the gallbladder fossa measures 2.5 x 2.4 cm, image number 69 of series 2. Previously 3.2 x 2.4 cm. The metastasis along the undersurface of the dome of liver measures 2 x 2.2 cm, image 57 of series 3. Previously this lesion measured 2.9 x 3.4 cm. The other smaller liver metastasis are less conspicuous on the current study. Gallbladder is negative. No biliary dilatation. Pancreas: Unremarkable appearance of the pancreas. Spleen: The spleen is negative. Adrenals/Urinary Tract: The metastasis involving the left adrenal gland measures 3.4 by 3.7 cm, image 71 of series 2. Previously 5.2 x 5.3 cm. Right adrenal gland metastasis has resolved. Unremarkable appearance of the kidneys. The urinary bladder appears normal. Stomach/Bowel: Small hiatal hernia. The small bowel loops have a normal course and caliber. No obstruction.  Unremarkable appearance of the proximal colon. Distal colonic diverticula noted without acute inflammation. Vascular/Lymphatic: Calcified atherosclerotic disease involves the abdominal aorta. No aneurysm. No enlarged retroperitoneal or mesenteric adenopathy. The index periaortic lymph node measures 1.2 cm, image 81 of series 2. Previously 2.0 cm. Index portacaval lymph node measures 2.5 cm, image 70 of series 2. Previously 3.6 cm. The index retrocaval node measures 1 cm, image 79 of series 2. Previously 1.7 cm. Index right external iliac lymph node measures 0.7 cm, image 121 of series 2. Previously 1.6 cm. Index left external iliac lymph node measures 1 cm, image 119 of series 2. Previously 1.2 cm. Reproductive: Prostate gland and seminal vesicles are unremarkable. Other: There is a left inguinal hernia which contains fat only. Musculoskeletal: Spondylosis noted within the thoracic and lumbar spine. Index lytic lesion involving large metastasis is involving the L2 vertebra is again noted. This appears similar to the previous exam, image 66 of series 4.  There is associated superior and inferior endplate fractures. Advanced spondylosis is identified throughout the lumbar spine with facet hypertrophy and degenerative change. No new lytic lesions identified. IMPRESSION: 1. Interval response to therapy. 2. Improvement in multi focal liver metastasis. 3. Improvement an enlarged abdominal and pelvic adenopathy 4. Interval improvement an bilateral adrenal gland metastasis. 5. Stable appearance of lytic lesion involving the L2 vertebra. 6. Aortic atherosclerosis and multi vessel coronary artery calcification. Electronically Signed   By: Kerby Moors M.D.   On: 05/25/2015 13:03     PATHOLOGY:    ASSESSMENT AND PLAN:  Metastatic urothelial carcinoma ([place]) Metastatic, high grade urothelial cancer undergoing active treatment with Carboplatin/Gemcitabine on 04/14/2015.  Oncology history is up-to-date.  Today is Day  8 of Cycle #3.    He was seen last week and noted chest pain.  EKG was performed and reviewed.  No acute changes noted.  This was compared to his 2014 EKG performed at [facility] by his primary care provider.  As a result, Onpro is planned to be held today to see if this is contributing to this pain.  Neulasta is deleted from the treatment plan.  Delton See is ordered and can be started once insurance approves this medication for reduction of SRE.  Delton See was recently approved thorugh his insurance.  Therefore, we will start the medication today.  I reviewed again, the risks, benefits, alternatives, and side effects of Denosumab including hypocalcemia and ONJ.  He knows to let his dentist know he is on Denosumab prior to any dental procedure.  He is to let us know prior to any elective oral surgery so we can hold the medication appropriately.  He is to consume Ca++ 1200 mg and Vit D 1000 units daily to prevent hypocalcemia.  He is already on appropriate dosing of Ca++ and Vit D and he confirms compliance.  He notes resolution of his chest discomfort when he increased his PPI to twice daily.  He was fearful of the possible risks associated with that increased dose.  He called the clinic the other day reporting intermittent LUQ abdominal pain.  He reports "it is there one day and gone the next" without any intervention.  I have encouraged him to use his pain medication for pain control as needed.  He is to let us know if pain is present, progressive, and persistent.    Despite his response to therapy with CT imaging in Jan 2017, he is educated that his stage of disease does not change.  Return for cycle #4 on 06/16/2015 and follow-up appointment with pre-treatment labs as ordered.    THERAPY PLAN:  Continue with treatment as planned.  We will plan on restaging after cycle #6, sooner if clinically indicated.  All questions were answered. The patient knows to call the clinic with any problems, questions or concerns.  We can certainly see the patient much sooner if necessary.  Patient and plan discussed with Dr. Ancil Linsey and she is in agreement with the aforementioned.   This note is electronically signed by: Doy Mince 06/02/2015 9:51 AM

## 2015-06-01 ENCOUNTER — Telehealth (HOSPITAL_COMMUNITY): Payer: Self-pay

## 2015-06-01 NOTE — Telephone Encounter (Signed)
Patient called this morning complaining of right sided abdominal pain at the liver. Pain started on Sunday. Patient took a Hydrocodone and it relieved the pain per patient. No pain on Monday. Patient states pain is off/on. Discussed with Dr. Whitney Muse. Her recommendation was to tell patient to continue pain meds. If pain gets worse or interferes with normal activity then to call back and patient will need to go to ER. Patient verbalized understanding of instructions.

## 2015-06-02 ENCOUNTER — Encounter (HOSPITAL_BASED_OUTPATIENT_CLINIC_OR_DEPARTMENT_OTHER): Payer: Medicare HMO | Admitting: Oncology

## 2015-06-02 ENCOUNTER — Encounter (HOSPITAL_COMMUNITY): Payer: Self-pay | Admitting: Oncology

## 2015-06-02 ENCOUNTER — Encounter (HOSPITAL_BASED_OUTPATIENT_CLINIC_OR_DEPARTMENT_OTHER): Payer: Medicare HMO

## 2015-06-02 VITALS — BP 143/77 | HR 87 | Temp 97.8°F | Resp 20 | Wt 272.0 lb

## 2015-06-02 VITALS — BP 140/72 | HR 80 | Temp 97.7°F | Resp 20

## 2015-06-02 DIAGNOSIS — Z5111 Encounter for antineoplastic chemotherapy: Secondary | ICD-10-CM

## 2015-06-02 DIAGNOSIS — C649 Malignant neoplasm of unspecified kidney, except renal pelvis: Secondary | ICD-10-CM

## 2015-06-02 DIAGNOSIS — R1012 Left upper quadrant pain: Secondary | ICD-10-CM

## 2015-06-02 DIAGNOSIS — C7910 Secondary malignant neoplasm of unspecified urinary organs: Secondary | ICD-10-CM

## 2015-06-02 DIAGNOSIS — C787 Secondary malignant neoplasm of liver and intrahepatic bile duct: Secondary | ICD-10-CM

## 2015-06-02 DIAGNOSIS — C7970 Secondary malignant neoplasm of unspecified adrenal gland: Secondary | ICD-10-CM | POA: Diagnosis not present

## 2015-06-02 DIAGNOSIS — C7951 Secondary malignant neoplasm of bone: Secondary | ICD-10-CM

## 2015-06-02 LAB — CBC WITH DIFFERENTIAL/PLATELET
Basophils Absolute: 0 10*3/uL (ref 0.0–0.1)
Basophils Relative: 0 %
EOS PCT: 2 %
Eosinophils Absolute: 0.1 10*3/uL (ref 0.0–0.7)
HCT: 28.7 % — ABNORMAL LOW (ref 39.0–52.0)
Hemoglobin: 9.4 g/dL — ABNORMAL LOW (ref 13.0–17.0)
LYMPHS PCT: 19 %
Lymphs Abs: 0.8 10*3/uL (ref 0.7–4.0)
MCH: 31.1 pg (ref 26.0–34.0)
MCHC: 32.8 g/dL (ref 30.0–36.0)
MCV: 95 fL (ref 78.0–100.0)
MONOS PCT: 6 %
Monocytes Absolute: 0.3 10*3/uL (ref 0.1–1.0)
Neutro Abs: 3.1 10*3/uL (ref 1.7–7.7)
Neutrophils Relative %: 73 %
PLATELETS: 307 10*3/uL (ref 150–400)
RBC: 3.02 MIL/uL — AB (ref 4.22–5.81)
RDW: 16.8 % — ABNORMAL HIGH (ref 11.5–15.5)
WBC: 4.2 10*3/uL (ref 4.0–10.5)

## 2015-06-02 LAB — COMPREHENSIVE METABOLIC PANEL
ALK PHOS: 123 U/L (ref 38–126)
ALT: 68 U/L — AB (ref 17–63)
AST: 62 U/L — ABNORMAL HIGH (ref 15–41)
Albumin: 2.7 g/dL — ABNORMAL LOW (ref 3.5–5.0)
Anion gap: 6 (ref 5–15)
BUN: 23 mg/dL — ABNORMAL HIGH (ref 6–20)
CALCIUM: 8.7 mg/dL — AB (ref 8.9–10.3)
CHLORIDE: 107 mmol/L (ref 101–111)
CO2: 27 mmol/L (ref 22–32)
CREATININE: 1.1 mg/dL (ref 0.61–1.24)
Glucose, Bld: 126 mg/dL — ABNORMAL HIGH (ref 65–99)
Potassium: 4.1 mmol/L (ref 3.5–5.1)
Sodium: 140 mmol/L (ref 135–145)
Total Bilirubin: 0.4 mg/dL (ref 0.3–1.2)
Total Protein: 5.8 g/dL — ABNORMAL LOW (ref 6.5–8.1)

## 2015-06-02 MED ORDER — DEXAMETHASONE SODIUM PHOSPHATE 100 MG/10ML IJ SOLN
10.0000 mg | Freq: Once | INTRAMUSCULAR | Status: AC
  Administered 2015-06-02: 10 mg via INTRAVENOUS
  Filled 2015-06-02: qty 1

## 2015-06-02 MED ORDER — SODIUM CHLORIDE 0.9 % IV SOLN
750.0000 mg/m2 | Freq: Once | INTRAVENOUS | Status: AC
  Administered 2015-06-02: 1900 mg via INTRAVENOUS
  Filled 2015-06-02: qty 49.97

## 2015-06-02 MED ORDER — SODIUM CHLORIDE 0.9 % IV SOLN
Freq: Once | INTRAVENOUS | Status: AC
  Administered 2015-06-02: 10:00:00 via INTRAVENOUS

## 2015-06-02 MED ORDER — PROCHLORPERAZINE MALEATE 10 MG PO TABS
10.0000 mg | ORAL_TABLET | Freq: Once | ORAL | Status: AC
  Administered 2015-06-02: 10 mg via ORAL
  Filled 2015-06-02: qty 1

## 2015-06-02 MED ORDER — SODIUM CHLORIDE 0.9 % IJ SOLN: 10.0000 mL | INTRAMUSCULAR | Status: DC | PRN

## 2015-06-02 MED ORDER — HEPARIN SOD (PORK) LOCK FLUSH 100 UNIT/ML IV SOLN
500.0000 [IU] | Freq: Once | INTRAVENOUS | Status: AC | PRN
  Administered 2015-06-02: 500 [IU]

## 2015-06-02 MED ORDER — ONDANSETRON HCL 40 MG/20ML IJ SOLN
8.0000 mg | Freq: Once | INTRAMUSCULAR | Status: AC
  Administered 2015-06-02: 8 mg via INTRAVENOUS
  Filled 2015-06-02: qty 4

## 2015-06-02 MED ORDER — DENOSUMAB 120 MG/1.7ML ~~LOC~~ SOLN
120.0000 mg | Freq: Once | SUBCUTANEOUS | Status: AC
  Administered 2015-06-02: 120 mg via SUBCUTANEOUS
  Filled 2015-06-02: qty 1.7

## 2015-06-02 NOTE — Patient Instructions (Addendum)
[place] at [facility] Discharge Instructions  RECOMMENDATIONS MADE BY THE CONSULTANT AND ANY TEST RESULTS WILL BE SENT TO YOUR REFERRING PHYSICIAN.    Exam completed by Kirby Crigler PA today Jayme Cloud today X-geva every 28 days EKG stable Continue PPI twice a day Follow up with Dr Whitney Muse on 1/25  Chemotherapy today as scheduled Continue taking calcium with vitamin D Please call the clinic if you have any questions or concerns    Thank you for choosing [place] at [facility] to provide your oncology and hematology care.  To afford each patient quality time with our provider, please arrive at least 15 minutes before your scheduled appointment time.    You need to re-schedule your appointment should you arrive 10 or more minutes late.  We strive to give you quality time with our providers, and arriving late affects you and other patients whose appointments are after yours.  Also, if you no show three or more times for appointments you may be dismissed from the clinic at the providers discretion.     Again, thank you for choosing [facility].  Our hope is that these requests will decrease the amount of time that you wait before being seen by our physicians.       _____________________________________________________________  Should you have questions after your visit to [facility], please contact our office at [phone] between the hours of 8:30 a.m. and 4:30 p.m.  Voicemails left after 4:30 p.m. will not be returned until the following business day.  For prescription refill requests, have your pharmacy contact our office.

## 2015-06-02 NOTE — Progress Notes (Signed)
Harry Franklin presents today for injection per MD orders. Xgeva  administered SQ in right Abdomen. Administration without incident. Patient tolerated well.  Patient also tolerated chemotherapy infusion well.

## 2015-06-08 ENCOUNTER — Encounter (HOSPITAL_COMMUNITY): Payer: Self-pay | Admitting: Cardiology

## 2015-06-08 ENCOUNTER — Emergency Department (HOSPITAL_COMMUNITY): Payer: Medicare HMO

## 2015-06-08 ENCOUNTER — Inpatient Hospital Stay (HOSPITAL_COMMUNITY)
Admission: EM | Admit: 2015-06-08 | Discharge: 2015-06-11 | DRG: 280 | Disposition: A | Discharge status: Home / Self Care | Payer: Medicare HMO | Attending: Family Medicine | Admitting: Family Medicine

## 2015-06-08 DIAGNOSIS — C799 Secondary malignant neoplasm of unspecified site: Secondary | ICD-10-CM

## 2015-06-08 DIAGNOSIS — K219 Gastro-esophageal reflux disease without esophagitis: Secondary | ICD-10-CM | POA: Diagnosis present

## 2015-06-08 DIAGNOSIS — D61810 Antineoplastic chemotherapy induced pancytopenia: Secondary | ICD-10-CM | POA: Diagnosis present

## 2015-06-08 DIAGNOSIS — C679 Malignant neoplasm of bladder, unspecified: Secondary | ICD-10-CM | POA: Diagnosis present

## 2015-06-08 DIAGNOSIS — D61818 Other pancytopenia: Secondary | ICD-10-CM

## 2015-06-08 DIAGNOSIS — H3530 Unspecified macular degeneration: Secondary | ICD-10-CM | POA: Diagnosis present

## 2015-06-08 DIAGNOSIS — Z7901 Long term (current) use of anticoagulants: Secondary | ICD-10-CM

## 2015-06-08 DIAGNOSIS — Z800 Family history of malignant neoplasm of digestive organs: Secondary | ICD-10-CM

## 2015-06-08 DIAGNOSIS — T451X5A Adverse effect of antineoplastic and immunosuppressive drugs, initial encounter: Secondary | ICD-10-CM | POA: Diagnosis present

## 2015-06-08 DIAGNOSIS — R0600 Dyspnea, unspecified: Secondary | ICD-10-CM | POA: Diagnosis not present

## 2015-06-08 DIAGNOSIS — I480 Paroxysmal atrial fibrillation: Secondary | ICD-10-CM | POA: Diagnosis present

## 2015-06-08 DIAGNOSIS — R7989 Other specified abnormal findings of blood chemistry: Secondary | ICD-10-CM | POA: Diagnosis not present

## 2015-06-08 DIAGNOSIS — I214 Non-ST elevation (NSTEMI) myocardial infarction: Secondary | ICD-10-CM | POA: Diagnosis present

## 2015-06-08 DIAGNOSIS — M1990 Unspecified osteoarthritis, unspecified site: Secondary | ICD-10-CM | POA: Diagnosis present

## 2015-06-08 DIAGNOSIS — R0609 Other forms of dyspnea: Secondary | ICD-10-CM | POA: Diagnosis present

## 2015-06-08 DIAGNOSIS — R600 Localized edema: Secondary | ICD-10-CM

## 2015-06-08 DIAGNOSIS — J449 Chronic obstructive pulmonary disease, unspecified: Secondary | ICD-10-CM | POA: Diagnosis present

## 2015-06-08 DIAGNOSIS — Z87891 Personal history of nicotine dependence: Secondary | ICD-10-CM

## 2015-06-08 DIAGNOSIS — R778 Other specified abnormalities of plasma proteins: Secondary | ICD-10-CM | POA: Diagnosis present

## 2015-06-08 DIAGNOSIS — Z8249 Family history of ischemic heart disease and other diseases of the circulatory system: Secondary | ICD-10-CM

## 2015-06-08 DIAGNOSIS — I4891 Unspecified atrial fibrillation: Secondary | ICD-10-CM | POA: Diagnosis not present

## 2015-06-08 DIAGNOSIS — D649 Anemia, unspecified: Secondary | ICD-10-CM | POA: Diagnosis not present

## 2015-06-08 DIAGNOSIS — C7910 Secondary malignant neoplasm of unspecified urinary organs: Secondary | ICD-10-CM | POA: Diagnosis not present

## 2015-06-08 DIAGNOSIS — Z86718 Personal history of other venous thrombosis and embolism: Secondary | ICD-10-CM

## 2015-06-08 DIAGNOSIS — I10 Essential (primary) hypertension: Secondary | ICD-10-CM | POA: Diagnosis present

## 2015-06-08 DIAGNOSIS — I2510 Atherosclerotic heart disease of native coronary artery without angina pectoris: Secondary | ICD-10-CM | POA: Diagnosis not present

## 2015-06-08 LAB — URINALYSIS, ROUTINE W REFLEX MICROSCOPIC
Bilirubin Urine: NEGATIVE
Glucose, UA: NEGATIVE mg/dL
Ketones, ur: NEGATIVE mg/dL
Leukocytes, UA: NEGATIVE
Nitrite: NEGATIVE
Protein, ur: 30 mg/dL — AB
Specific Gravity, Urine: 1.02 (ref 1.005–1.030)
pH: 6 (ref 5.0–8.0)

## 2015-06-08 LAB — BASIC METABOLIC PANEL
Anion gap: 8 (ref 5–15)
BUN: 29 mg/dL — ABNORMAL HIGH (ref 6–20)
CO2: 26 mmol/L (ref 22–32)
Calcium: 8.2 mg/dL — ABNORMAL LOW (ref 8.9–10.3)
Chloride: 106 mmol/L (ref 101–111)
Creatinine, Ser: 1.14 mg/dL (ref 0.61–1.24)
GFR calc Af Amer: >60 mL/min (ref >60)
GFR calc non Af Amer: >60 mL/min (ref >60)
Glucose, Bld: 120 mg/dL — ABNORMAL HIGH (ref 65–99)
Potassium: 3.9 mmol/L (ref 3.5–5.1)
Sodium: 140 mmol/L (ref 135–145)

## 2015-06-08 LAB — CBC WITH DIFFERENTIAL/PLATELET
Basophils Absolute: 0 10*3/uL (ref 0.0–0.1)
Basophils Relative: 0 %
Eosinophils Absolute: 0 10*3/uL (ref 0.0–0.7)
Eosinophils Relative: 0 %
HCT: 23.8 % — ABNORMAL LOW (ref 39.0–52.0)
Hemoglobin: 8.2 g/dL — ABNORMAL LOW (ref 13.0–17.0)
Lymphocytes Relative: 30 %
Lymphs Abs: 0.6 10*3/uL — ABNORMAL LOW (ref 0.7–4.0)
MCH: 32.5 pg (ref 26.0–34.0)
MCHC: 34.5 g/dL (ref 30.0–36.0)
MCV: 94.4 fL (ref 78.0–100.0)
Monocytes Absolute: 0.1 10*3/uL (ref 0.1–1.0)
Monocytes Relative: 4 %
Neutro Abs: 1.3 10*3/uL — ABNORMAL LOW (ref 1.7–7.7)
Neutrophils Relative %: 66 %
Platelets: 33 10*3/uL — ABNORMAL LOW (ref 150–400)
RBC: 2.52 MIL/uL — ABNORMAL LOW (ref 4.22–5.81)
RDW: 16.9 % — ABNORMAL HIGH (ref 11.5–15.5)
Smear Review: DECREASED
WBC: 2 10*3/uL — ABNORMAL LOW (ref 4.0–10.5)

## 2015-06-08 LAB — TSH: TSH: 1.723 u[IU]/mL (ref 0.350–4.500)

## 2015-06-08 LAB — T4, FREE: FREE T4: 1.19 ng/dL — AB (ref 0.61–1.12)

## 2015-06-08 LAB — URINE MICROSCOPIC-ADD ON
Squamous Epithelial / LPF: NONE SEEN
WBC, UA: NONE SEEN WBC/hpf (ref 0–5)

## 2015-06-08 LAB — MRSA PCR SCREENING: MRSA by PCR: NEGATIVE

## 2015-06-08 LAB — TROPONIN I
TROPONIN I: 8.65 ng/mL — AB (ref <0.031)
Troponin I: 5.11 ng/mL (ref <0.031)
Troponin I: 8.68 ng/mL (ref <0.031)

## 2015-06-08 LAB — BRAIN NATRIURETIC PEPTIDE: B Natriuretic Peptide: 657 pg/mL — ABNORMAL HIGH (ref 0.0–100.0)

## 2015-06-08 LAB — HEPARIN LEVEL (UNFRACTIONATED)

## 2015-06-08 MED ORDER — NITROGLYCERIN 2 % TD OINT
0.5000 [in_us] | TOPICAL_OINTMENT | Freq: Four times a day (QID) | TRANSDERMAL | Status: DC
  Administered 2015-06-08 – 2015-06-11 (×11): 0.5 [in_us] via TOPICAL
  Filled 2015-06-08 (×11): qty 1

## 2015-06-08 MED ORDER — DILTIAZEM HCL 100 MG IV SOLR
5.0000 mg/h | INTRAVENOUS | Status: DC
  Administered 2015-06-08 (×2): 15 mg/h via INTRAVENOUS
  Administered 2015-06-09: 5 mg/h via INTRAVENOUS
  Filled 2015-06-08 (×3): qty 100

## 2015-06-08 MED ORDER — ONDANSETRON HCL 4 MG/2ML IJ SOLN
4.0000 mg | Freq: Four times a day (QID) | INTRAMUSCULAR | Status: DC | PRN
  Filled 2015-06-08: qty 2

## 2015-06-08 MED ORDER — SODIUM CHLORIDE 0.9 % IJ SOLN
3.0000 mL | Freq: Two times a day (BID) | INTRAMUSCULAR | Status: DC
  Administered 2015-06-08 – 2015-06-11 (×6): 3 mL via INTRAVENOUS

## 2015-06-08 MED ORDER — IOHEXOL 350 MG/ML SOLN
100.0000 mL | Freq: Once | INTRAVENOUS | Status: AC | PRN
  Administered 2015-06-08: 100 mL via INTRAVENOUS

## 2015-06-08 MED ORDER — HEPARIN BOLUS VIA INFUSION
1000.0000 [IU] | Freq: Once | INTRAVENOUS | Status: AC
  Administered 2015-06-08: 1000 [IU] via INTRAVENOUS
  Filled 2015-06-08: qty 1000

## 2015-06-08 MED ORDER — ONDANSETRON HCL 4 MG PO TABS: 4.0000 mg | ORAL_TABLET | Freq: Four times a day (QID) | ORAL | Status: DC | PRN

## 2015-06-08 MED ORDER — HEPARIN (PORCINE) IN NACL 100-0.45 UNIT/ML-% IJ SOLN
1750.0000 [IU]/h | INTRAMUSCULAR | Status: DC
  Administered 2015-06-08: 1600 [IU]/h via INTRAVENOUS
  Filled 2015-06-08 (×2): qty 250

## 2015-06-08 NOTE — H&P (Signed)
History and Physical  Harry Franklin [id number] DOB: 1940/12/04 DOA: 06/08/2015  Referring physician: Tyrell Antonio, ER physician PCP: [name], MD   Chief Complaint: Shortness of breath   HPI: Harry Franklin is a 75 y.o. male  With past medical history of tobacco abuse 30 years/quit 20 years ago and recently diagnosed metastatic bladder cancer on chemotherapy 2 months prior who presents to the emergency room on 1/17 after complaining of short progressively worsening dyspnea on exertion and exercise intolerance for the past 3-4 weeks. No cough no fever no other symptoms. Patient did relate some pressure and sharp chest pain over left breast radiating down left arm the night prior which is what prompted his children to convince him to come into the emergency room today. Pressure was nonradiating elsewhere, and after about 10 minutes self resolved without return.  In the emergency room, CT scan of the chest ruled out PE noting only emphysematous changes. BNP mildly elevated at 697. EKG unremarkable-normal sinus rhythm, but most concerning was a troponin of 5.11. Hospitalists were called for further evaluation and admission. Following arrival from the emergency room to the step down unit, patient was noted to go into rapid atrial fibrillation (a new diagnosis for the patient_   Review of Systems:  Patient seen after arrival to stepdown . Pt complains of dyspnea on exertion, although not currently while he is sitting in bed .  Pt denies any headaches, vision changes, chest pain, current shortness of breath, wheeze, cough, abdominal pain, hematuria, dysuria, constipation, diarrhea, focal extremity numbness weakness or pain .  Review of systems are otherwise negative  Past Medical History  Diagnosis Date  . GERD (gastroesophageal reflux disease)   . Hypokalemia   . Hypertension   . Cataract   . Arthritis   . Macular degeneration, age related   . DVT (deep venous thrombosis) ([place])       developed after traveling x 1, developed another after left foot surgery  . Hydrocele in adult   . 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 Replacement total knee    . Appendectomy    . Hammer toe surgery Left   . Lapidus procedure    . Hallux fusion    . Colonoscopy  03/31/05  . Colonoscopy N/A 10/02/2014    [id number] diverticulosis  . Esophagogastroduodenoscopy N/A 10/02/2014    [id number] ulcerative reflux/s/p dilation/large HH  . Maloney dilation N/A 10/02/2014    Procedure: Venia Minks DILATION;  Surgeon: Daneil Dolin, MD;  [facility];  Service: Endoscopy;  Laterality: N/A;  . Esophagogastroduodenoscopy N/A 01/29/2015    Procedure: ESOPHAGOGASTRODUODENOSCOPY (EGD);  Surgeon: Daneil Dolin, MD;  Location[facility];  Service: Endoscopy;  Laterality: N/A;  0800-rescheduled to 9/9 @ 915 Candy notified pt  . Esophageal dilation  01/29/2015    Procedure: ESOPHAGEAL DILATION;  Surgeon: Daneil Dolin, MD;  [facility]: [facility];  Service: Endoscopy;;  . Portacath placement Left 04/13/15   Social History:  reports that he quit smoking about 31 years ago. His smoking use included Cigarettes. He has a 31 pack-year smoking history. He has never used smokeless tobacco. He reports that he does not drink alcohol or use illicit drugs. Patient lives at home with his wife  & is able to participate in activities of daily living without assistance. He feels like his quality of life in the last 3-4 weeks has greatly gone downhill because of this  inability to perform any kind of activity  Allergies  Allergen Reactions  . Rabeprazole     Other reaction(s): DIARRHEA  . Vancomycin Hives and Rash    Family History  Problem Relation Age of Onset  . Colon cancer Maternal Uncle   . Heart disease Father       Prior to Admission medications   Medication Sig Start Date End Date Taking? Authorizing Provider  aspirin 325 MG  tablet Take 325 mg by mouth daily.   Yes Historical Provider, MD  Calcium Carb-Cholecalciferol 600-500 MG-UNIT CAPS Take 1 capsule by mouth 2 (two) times daily.   Yes Historical Provider, MD  calcium carbonate (TUMS EX) 750 MG chewable tablet Chew 2 tablets by mouth 2 (two) times daily as needed for heartburn.   Yes Historical Provider, MD  CARBOPLATIN IV Inject into the vein. Day 1 every 21 days   Yes Historical Provider, MD  Gemcitabine HCl (GEMZAR IV) Inject into the vein. Days 1, 8 every 21 days   Yes Historical Provider, MD  hydrocortisone cream 1 % Apply 1 application topically 2 (two) times daily as needed for itching.   Yes Historical Provider, MD  lidocaine-prilocaine (EMLA) cream Apply a quarter size amount to port site 1 hour prior to chemo. Do not rub in. Cover with plastic wrap. 04/12/15  Yes Patrici Ranks, MD  metoprolol (LOPRESSOR) 100 MG tablet Take 100 mg by mouth at bedtime.  02/25/13  Yes Historical Provider, MD  Multiple Vitamins-Minerals (PRESERVISION AREDS 2 PO) Take 1 tablet by mouth 2 (two) times daily.   Yes Historical Provider, MD  omeprazole (PRILOSEC) 40 MG capsule Take 40 mg by mouth 2 (two) times daily.    Yes Historical Provider, MD  ondansetron (ZOFRAN) 8 MG tablet Take 1 tablet (8 mg total) by mouth every 8 (eight) hours as needed for nausea or vomiting. 04/12/15  Yes Patrici Ranks, MD  oxyCODONE (OXY IR/ROXICODONE) 5 MG immediate release tablet Take 1-2 tablets (5-10 mg total) by mouth every 4 (four) hours as needed for severe pain. 04/05/15  Yes Baird Cancer, PA-C  prochlorperazine (COMPAZINE) 10 MG tablet Take 1 tablet (10 mg total) by mouth every 6 (six) hours as needed for nausea or vomiting. Patient not taking: Reported on 04/23/2015 04/12/15   Patrici Ranks, MD    Physical Exam: BP 115/82 mmHg  Pulse 76  Temp(Src) 98.7 F (37.1 C)  Resp 21  Ht 6\' 4"  (1.93 m)  Wt 125.3 kg (276 lb 3.8 oz)  BMI 33.64 kg/m2  SpO2 99%  General:  Alert and  oriented 3, no acute distress  Eyes: Sclera nonicteric, extraocular movements are intact  ENT: Normocephalic, atraumatic, mucous membranes are moist  Neck: No JVD  Cardiovascular: Irregular rhythm, borderline tachycardia  Respiratory: Clear to auscultation bilaterally, no crackles, no wheezes  Abdomen: Soft, nontender, nondistended, positive bowel sounds  Skin: No skin breaks, tears or lesions. Noted port in chest Musculoskeletal: No clubbing or cyanosis, 1+ pitting edema from the knees down  Psychiatric: patient is appropriate, no evidence of psychosis  Neurologic: no focal deficits           Labs on Admission:  Basic Metabolic Panel:  Recent Labs Lab 06/02/15 0629 06/08/15 0830  NA 140 140  K 4.1 3.9  CL 107 106  CO2 27 26  GLUCOSE 126* 120*  BUN 23* 29*  CREATININE 1.10 1.14  CALCIUM 8.7* 8.2*   Liver Function Tests:  Recent Labs Lab 06/02/15 [phone]  AST 62*  ALT 68*  ALKPHOS 123  BILITOT 0.4  PROT 5.8*  ALBUMIN 2.7*   No results for input(s): LIPASE, AMYLASE in the last 168 hours. No results for input(s): AMMONIA in the last 168 hours. CBC:  Recent Labs Lab 06/02/15 0629 06/08/15 0830  WBC 4.2 2.0*  NEUTROABS 3.1 1.3*  HGB 9.4* 8.2*  HCT 28.7* 23.8*  MCV 95.0 94.4  PLT 307 33*   Cardiac Enzymes:  Recent Labs Lab 06/08/15 0830  TROPONINI 5.11*    BNP (last 3 results)  Recent Labs  06/08/15 0830  BNP 657.0*    ProBNP (last 3 results) No results for input(s): PROBNP in the last 8760 hours.  CBG: No results for input(s): GLUCAP in the last 168 hours.  Radiological Exams on Admission: Ct Angio Chest Pe W/cm &/or Wo Cm  06/08/2015  CLINICAL DATA:  Shortness of breath, tachycardia starting 05/16/2015, metastatic urothelial cancer on chemotherapy EXAM: CT ANGIOGRAPHY CHEST WITH CONTRAST TECHNIQUE: Multidetector CT imaging of the chest was performed using the standard protocol during bolus administration of intravenous contrast. Multiplanar  CT image reconstructions and MIPs were obtained to evaluate the vascular anatomy. CONTRAST:  134mL OMNIPAQUE IOHEXOL 350 MG/ML SOLN COMPARISON:  05/25/2015 FINDINGS: Images of the thoracic inlet are unremarkable. Emphysematous changes bilateral upper lobe again noted. Stable bilateral apical scarring right greater than left. There is no mediastinal hematoma or adenopathy. Small hiatal hernia. The study is of excellent technical quality. No pulmonary embolus is noted. A right hilar lymph node again noted measures 9 mm short-axis stable from prior exam. There is small bilateral pleural effusion. Mild atelectasis noted bilateral lower lobe posteriorly. Sagittal images of the spine shows degenerative changes thoracic spine. Diffuse osteopenia. No destructive bony lesions are noted. Minimal perihilar bronchitic changes. No segmental infiltrate or pulmonary edema. No destructive rib lesions are noted. The visualized upper abdomen is stable. No adrenal gland mass is noted. Atherosclerotic calcifications of coronary arteries again noted. Heart size is within normal limits. No pericardial effusion. Right IJ Port-A-Cath in place. Review of the MIP images confirms the above findings. IMPRESSION: 1. No pulmonary embolus is noted. 2. Again noted emphysematous changes bilateral upper lobe. 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 Again noted bilateral apical pleural parenchymal scarring right greater than left. 3. No mediastinal hematoma or adenopathy. 4. Stable borderline enlarged right hilar lymph node. 5. No infiltrate or pulmonary edema. Bilateral small pleural effusion with bilateral lower lobe posterior atelectasis. Electronically Signed   By: Lahoma Crocker M.D.   On: 06/08/2015 10:56    EKG: Independently reviewed. normal sinus rhythm-this EKG was done this morning   Assessment/Plan Present on Admission:  . Dyspnea on exertion: We'll check ABG although patient's oxygen saturations while talking to him or  100% on room air. I suspect that this may be related to his cancer progression and/or chemotherapy. 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 I do not think that this time that this is a COPD exacerbation.  . Elevated troponin-suspected non-STEMI. Have spoken with cardiology who will see the patient and the morning. In the meantime, IV heparin, nitroglycerin patch and cycle cardiac enzymes.  Atrial fibrillation with rapid ventricular rate: New diagnosis: Already on anticoagulation from heparin. Chads 2 score of 2. Checking echocardiogram, starting IV Cardizem  . Metastatic urothelial carcinoma [facility]): Notify oncology patient's admission.  Marland Kitchen Antineoplastic chemotherapy induced pancytopenia ([place]): We'll  continue to follow closely.   Consultants: cardiology Oncology   Code Status: full code, patient does have full set of advanced directives   Family Communication: daughter at the bedside    Disposition Plan: continued stepdown, likely here for several days while working up non-STEMI, dyspnea and A. fib   Time spent: 45 minutes   [facility] Pager [phone]

## 2015-06-08 NOTE — Progress Notes (Signed)
Previous nurse states Dr. Maryland Pink expected pt's troponins to be elevated. Will continue to monitor.

## 2015-06-08 NOTE — ED Notes (Signed)
CRITICAL VALUE ALERT  Critical value received:  Troponin 5.11  Date of notification:  06/08/15  Time of notification:  0905  Critical value read back:Yes.    Nurse who received alert:  Norm Salt, RN  MD notified (1st page):  Dr. Wilson Singer  Time of first page:  0906  MD notified (2nd page):  Time of second page:  Responding MD:  Dr. Wilson Singer  Time MD responded:  [phone]

## 2015-06-08 NOTE — ED Provider Notes (Signed)
CSN: [id number]     Arrival date & time 06/08/15  0725 History   First MD Initiated Contact with Patient 06/08/15 [id number]     Chief Complaint  Patient presents with  . Shortness of Breath     (Consider location/radiation/quality/duration/timing/severity/associated sxs/prior Treatment) HPI   75 year old male with dyspnea.  He reports increasing dyspnea since around Christmas time. Worse with exertion. Slowly progressing. Over the last couple of days he has also noticed orthopnea. Denies any acute pain. No fevers or chills. No cough. No unusual swelling. Does have a past history of DVT, not currently anticoagulated. From his report he sets that they previously developed after foot surgery and then another one after a prolonged trip. No known history of CAD. Recently diagnosed metastatic bladder cancer. Initiated chemotherapy on 04/14/2015.   Past Medical History  Diagnosis Date  . GERD (gastroesophageal reflux disease)   . Hypokalemia   . Hypertension   . Cataract   . Arthritis   . Macular degeneration, age related   . DVT (deep venous thrombosis) ([place])     developed after traveling x 1, developed another after left foot surgery  . Hydrocele in adult   . 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 Replacement total knee    . Appendectomy    . Hammer toe surgery Left   . Lapidus procedure    . Hallux fusion    . Colonoscopy  03/31/05  . Colonoscopy N/A 10/02/2014    [id number] diverticulosis  . Esophagogastroduodenoscopy N/A 10/02/2014    [id number] ulcerative reflux/s/p dilation/large HH  . Maloney dilation N/A 10/02/2014    Procedure: Venia Minks DILATION;  Surgeon: Daneil Dolin, MD;  [facility];  Service: Endoscopy;  Laterality: N/A;  . Esophagogastroduodenoscopy N/A 01/29/2015    Procedure: ESOPHAGOGASTRODUODENOSCOPY (EGD);  Surgeon: Daneil Dolin, MD;  [facility];  Service: Endoscopy;  Laterality:  N/A;  0800-rescheduled to 9/9 @ 915 Candy notified pt  . Esophageal dilation  01/29/2015    Procedure: ESOPHAGEAL DILATION;  Surgeon: Daneil Dolin, MD;  [facility];  Service: Endoscopy;;  . Portacath placement Left 04/13/15   Family History  Problem Relation Age of Onset  . Colon cancer Maternal Uncle   . Heart disease Father    Social History  Substance Use Topics  . Smoking status: Former Smoker -- 1.00 packs/day for 31 years    Types: Cigarettes    Quit date: 01/22/1984  . Smokeless tobacco: Never Used     Comment: qUIT IN 1985  . Alcohol Use: No    Review of Systems  All systems reviewed and negative, other than as noted in HPI.   Allergies  Rabeprazole and Vancomycin  Home Medications   Prior to Admission medications   Medication Sig Start Date End Date Taking? Authorizing Provider  aspirin 325 MG tablet Take 325 mg by mouth daily.    Historical Provider, MD  Calcium Carb-Cholecalciferol 600-500 MG-UNIT CAPS Take 1 capsule by mouth 2 (two) times daily.    Historical Provider, MD  calcium carbonate (TUMS EX) 750 MG chewable tablet Chew 2 tablets by mouth 2 (two) times daily as needed for heartburn.    Historical Provider, MD  CARBOPLATIN IV Inject into the vein. Day 1 every 21 days    Historical Provider, MD  Gemcitabine HCl (GEMZAR IV) Inject into the vein. Days 1, 8 every 21 days    Historical Provider, MD  lidocaine-prilocaine (EMLA) cream Apply a quarter size amount to port site 1 hour prior to chemo. Do not rub in. Cover with plastic wrap. 04/12/15   Patrici Ranks, MD  metoprolol (LOPRESSOR) 100 MG tablet Take 100 mg by mouth at bedtime.  02/25/13   Historical Provider, MD  Multiple Vitamins-Minerals (PRESERVISION AREDS 2 PO) Take 1 tablet by mouth 2 (two) times daily.    Historical Provider, MD  omeprazole (PRILOSEC) 40 MG capsule Take 40 mg by mouth 2 (two) times daily.     Historical Provider, MD  ondansetron (ZOFRAN) 8 MG tablet Take 1 tablet (8 mg  total) by mouth every 8 (eight) hours as needed for nausea or vomiting. Patient not taking: Reported on 05/26/2015 04/12/15   Patrici Ranks, MD  oxyCODONE (OXY IR/ROXICODONE) 5 MG immediate release tablet Take 1-2 tablets (5-10 mg total) by mouth every 4 (four) hours as needed for severe pain. Patient not taking: Reported on 04/23/2015 04/05/15   Manon Hilding Kefalas, PA-C  prochlorperazine (COMPAZINE) 10 MG tablet Take 1 tablet (10 mg total) by mouth every 6 (six) hours as needed for nausea or vomiting. Patient not taking: Reported on 04/23/2015 04/12/15   Patrici Ranks, MD  ranitidine (ZANTAC) 150 MG capsule Take 150 mg by mouth 2 (two) times daily. Reported on 05/26/2015    Historical Provider, MD   BP 135/83 mmHg  Pulse 105  Temp(Src) 98.7 F (37.1 C)  Resp 24  Ht 6\' 4"  (1.93 m)  Wt 280 lb (127.007 kg)  BMI 34.10 kg/m2  SpO2 100% Physical Exam  Constitutional: He is oriented to person, place, and time. He appears well-developed and well-nourished. No distress.  HENT:  Head: Normocephalic and atraumatic.  Eyes: Conjunctivae are normal. Right eye exhibits no discharge. Left eye exhibits no discharge.  Neck: Neck supple.  Cardiovascular: Regular rhythm and normal heart sounds.  Exam reveals no gallop and no friction rub.   No murmur heard. Mild tachycardia. Port R chest.   Pulmonary/Chest: Effort normal and breath sounds normal. No respiratory distress.  Abdominal: Soft. He exhibits no distension. There is no tenderness.  Musculoskeletal: He exhibits no edema or tenderness.  Mild edema LE, R>L. No calf tenderness. Negative Homan's.   Neurological: He is alert and oriented to person, place, and time.  Skin: Skin is warm and dry.  Psychiatric: He has a normal mood and affect. His behavior is normal. Thought content normal.  Nursing note and vitals reviewed.   ED Course  Procedures (including critical care time)  CRITICAL CARE Performed by: Virgel Manifold Total critical care  time: 40 minutes Critical care time was exclusive of separately billable procedures and treating other patients. Critical care was necessary to treat or prevent imminent or life-threatening deterioration. Critical care was time spent personally by me on the following activities: development of treatment plan with patient and/or surrogate as well as nursing, discussions with consultants, evaluation of patient's response to treatment, examination of patient, obtaining history from patient or surrogate, ordering and performing treatments and interventions, ordering and review of laboratory studies, ordering and review of radiographic studies, pulse oximetry and re-evaluation of patient's condition.  Labs Review Labs Reviewed  CBC WITH DIFFERENTIAL/PLATELET - Abnormal; Notable for the following:    WBC 2.0 (*)    RBC 2.52 (*)    Hemoglobin 8.2 (*)    HCT 23.8 (*)    RDW 16.9 (*)    Platelets 33 (*)    Neutro Abs 1.3 (*)  Lymphs Abs 0.6 (*)    All other components within normal limits  BASIC METABOLIC PANEL - Abnormal; Notable for the following:    Glucose, Bld 120 (*)    BUN 29 (*)    Calcium 8.2 (*)    All other components within normal limits  TROPONIN I - Abnormal; Notable for the following:    Troponin I 5.11 (*)    All other components within normal limits  BRAIN NATRIURETIC PEPTIDE - Abnormal; Notable for the following:    B Natriuretic Peptide 657.0 (*)    All other components within normal limits  URINALYSIS, ROUTINE W REFLEX MICROSCOPIC (NOT AT [facility]) - Abnormal; Notable for the following:    Color, Urine ORANGE (*)    APPearance HAZY (*)    Hgb urine dipstick MODERATE (*)    Protein, ur 30 (*)    All other components within normal limits  URINE MICROSCOPIC-ADD ON - Abnormal; Notable for the following:    Bacteria, UA FEW (*)    All other components within normal limits    Imaging Review No results found. I have personally reviewed and evaluated these images and lab  results as part of my medical decision-making.   EKG Interpretation   Date/Time:  Tuesday June 08 2015 07:34:15 EST Ventricular Rate:  101 PR Interval:  155 QRS Duration: 85 QT Interval:  402 QTC Calculation: 521 R Axis:   67 Text Interpretation:  Sinus tachycardia RSR' in V1 or V2, right VCD or RVH  Nonspecific repol abnormality, lateral leads Prolonged QT interval  Confirmed by Wilson Singer  MD, Timia Casselman ([id number]) on 06/08/2015 8:18:29 AM      MDM   Final diagnoses:  Dyspnea  Pancytopenia ([place])  Metastatic cancer ([place])  NSTEMI (non-ST elevated myocardial infarction) ([place])    74yM with persistent and worsening dyspnea.   Consider PE. Did recently have CT chest but not angiography. Actively treated CA, resting HR around 100. Does have past hx of DVT but from his description they sound provoked with one after orthopedic surgery and another with prolonged traveling. RLE mildly larger than left but pt reports this is chronic since R knee surgery. No calf tenderness.   Also reports fever of 100.4 earlier today. Consider infectious etiology, but timeline of symptoms since around Christmas not fitting with this and prior imaging w/o signs of it.  Consider marrow suppression. Being monitored and recent labs relatively ok.    Calcium noted in numerous coronary vessels on recent CT implies coronary artherosclerotic disease to some degree, but cannot determine posisble flow limiting lesion(s) based on this. Dyspnea could be his anginal equivalent. Initial EKG with possibly some mild diffuse ST depression, but baseline somewhat undulating. Will repeat.   Also consider cardiotoxicity from chemotherapy. Really outside my scope as I am not familiar with many chemotherapeutic agents nor when you may even expect to possibly see such side effects.  My primary concern today is excluding PE. Such a diagnosis would readily impact mortality and his quality of life. Fairly easily identifiable and treatable.  Symptomatic cardiac disease is a concern, but with stage IV cancer, I'm not sure how much benefit he would gain in aggressively working it up. With chemo therapy and fever, infection is possible and may explain recent worsening of symptoms but overall doesn't fit well with symptom progression.     Well actually several things going on. Pancytopenic. Increasing anemia likely contributing. Troponin markedly elevated. Heparin/aspirin deferred with platelets 33,000. BNP elevated and reports orthopnea. May  be developing some degree of heart failure.    Leukopenia/neutropenia. [place] 1,300 which isn't terribly low but trending shows decline and at this point not sure if nadir or not. Reported fever of 100.4 and took tylenol prior to arrival. Empiric abx considered, but deferred at this time w/o clear source of infection. HD stable and actually appears fairly well on exam.   I still want to exclude PE at this time although certainly have alternative explanations for his symptoms.   Virgel Manifold, MD 06/08/15 1057

## 2015-06-08 NOTE — Progress Notes (Signed)
ANTICOAGULATION CONSULT NOTE - Initial Consult  Pharmacy Consult for heparin Indication: atrial fibrillation  Allergies  Allergen Reactions  . Rabeprazole     Other reaction(s): DIARRHEA  . Vancomycin Hives and Rash    Patient Measurements: Height: 6\' 4"  (193 cm) Weight: 276 lb 3.8 oz (125.3 kg) IBW/kg (Calculated) : 86.8 Heparin Dosing Weight: 113.5  Vital Signs: Temp: 98.7 F (37.1 C) (01/17 0733) BP: 115/82 mmHg (01/17 1600) Pulse Rate: 76 (01/17 1600)  Labs:  Recent Labs  06/08/15 0830  HGB 8.2*  HCT 23.8*  PLT 33*  CREATININE 1.14  TROPONINI 5.11*    Estimated Creatinine Clearance: 84.7 mL/min (by C-G formula based on Cr of 1.14).   Medical History: Past Medical History  Diagnosis Date  . GERD (gastroesophageal reflux disease)   . Hypokalemia   . Hypertension   . Cataract   . Arthritis   . Macular degeneration, age related   . DVT (deep venous thrombosis) ([place])     developed after traveling x 1, developed another after left foot surgery  . Hydrocele in adult   . 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 aspirin 325 MG tablet Take 325 mg by mouth daily.   06/07/2015 at Unknown time  . Calcium Carb-Cholecalciferol 600-500 MG-UNIT CAPS Take 1 capsule by mouth 2 (two) times daily.   06/07/2015 at Unknown time  . calcium carbonate (TUMS EX) 750 MG chewable tablet Chew 2 tablets by mouth 2 (two) times daily as needed for heartburn.   06/07/2015 at Unknown time  . CARBOPLATIN IV Inject into the vein. Day 1 every 21 days   06/02/2015  . Gemcitabine HCl (GEMZAR IV) Inject into the vein. Days 1, 8 every 21 days   06/02/2015  . hydrocortisone cream 1 % Apply 1 application topically 2 (two) times daily as needed for itching.   Past Month at Unknown time  . lidocaine-prilocaine (EMLA) cream Apply a quarter size amount to port site 1 hour prior to chemo. Do not rub  in. Cover with plastic wrap. 30 g 3 Unknown  . metoprolol (LOPRESSOR) 100 MG tablet Take 100 mg by mouth at bedtime.    06/07/2015 at 1800  . Multiple Vitamins-Minerals (PRESERVISION AREDS 2 PO) Take 1 tablet by mouth 2 (two) times daily.   06/07/2015 at Unknown time  . omeprazole (PRILOSEC) 40 MG capsule Take 40 mg by mouth 2 (two) times daily.    06/07/2015 at Unknown time  . ondansetron (ZOFRAN) 8 MG tablet Take 1 tablet (8 mg total) by mouth every 8 (eight) hours as needed for nausea or vomiting. 30 tablet 2 Past Month at Unknown time  . oxyCODONE (OXY IR/ROXICODONE) 5 MG immediate release tablet Take 1-2 tablets (5-10 mg total) by mouth every 4 (four) hours as needed for severe pain. 60 tablet 0 Past Week at Unknown time  . prochlorperazine (COMPAZINE) 10 MG tablet Take 1 tablet (10 mg total) by mouth every 6 (six) hours as needed for nausea or vomiting. (Patient not taking: Reported on 04/23/2015) 30 tablet 2 Not Taking    Assessment: 75 yo man to start heparin for afib.  He is pancytopenic with PTLC 33, Hg 8.2 as he receives chemotherapy. Will aim for lower end of therapeutic range Goal of Therapy:  Heparin level 0.3-0.5 units/ml  Monitor platelets by anticoagulation protocol: Yes    Plan:  Heparin drip at 1600 units/hr after a  small 1000 unit bolus. Check heparin level 6 hours after start Daily HL and CBC while on heparin  Thanks for allowing pharmacy to be a part of this patient's care.  Excell Seltzer, PharmD Clinical Pharmacist  06/08/2015,4:47 PM

## 2015-06-08 NOTE — ED Notes (Signed)
MD at bedside. 

## 2015-06-08 NOTE — ED Notes (Signed)
C/o sob since christmas that is getting worse.  Also c/o weakness.  currently being treated with chemotherapy

## 2015-06-09 ENCOUNTER — Other Ambulatory Visit (HOSPITAL_COMMUNITY): Payer: Self-pay | Admitting: Oncology

## 2015-06-09 ENCOUNTER — Inpatient Hospital Stay (HOSPITAL_COMMUNITY): Payer: Medicare HMO

## 2015-06-09 DIAGNOSIS — D649 Anemia, unspecified: Secondary | ICD-10-CM

## 2015-06-09 DIAGNOSIS — I4891 Unspecified atrial fibrillation: Secondary | ICD-10-CM

## 2015-06-09 DIAGNOSIS — I214 Non-ST elevation (NSTEMI) myocardial infarction: Secondary | ICD-10-CM

## 2015-06-09 LAB — COMPREHENSIVE METABOLIC PANEL
ALBUMIN: 2.5 g/dL — AB (ref 3.5–5.0)
ALT: 42 U/L (ref 17–63)
ANION GAP: 7 (ref 5–15)
AST: 49 U/L — ABNORMAL HIGH (ref 15–41)
Alkaline Phosphatase: 95 U/L (ref 38–126)
BILIRUBIN TOTAL: 1.1 mg/dL (ref 0.3–1.2)
BUN: 24 mg/dL — ABNORMAL HIGH (ref 6–20)
CO2: 26 mmol/L (ref 22–32)
Calcium: 7.9 mg/dL — ABNORMAL LOW (ref 8.9–10.3)
Chloride: 105 mmol/L (ref 101–111)
Creatinine, Ser: 1.08 mg/dL (ref 0.61–1.24)
GFR calc Af Amer: >60 mL/min (ref >60)
Glucose, Bld: 105 mg/dL — ABNORMAL HIGH (ref 65–99)
POTASSIUM: 3.6 mmol/L (ref 3.5–5.1)
Sodium: 138 mmol/L (ref 135–145)
TOTAL PROTEIN: 5.1 g/dL — AB (ref 6.5–8.1)

## 2015-06-09 LAB — DIFFERENTIAL
BASOS PCT: 0 %
Basophils Absolute: 0 10*3/uL (ref 0.0–0.1)
EOS ABS: 0 10*3/uL (ref 0.0–0.7)
EOS PCT: 0 %
Lymphocytes Relative: 40 %
Lymphs Abs: 0.9 10*3/uL (ref 0.7–4.0)
Monocytes Absolute: 0.2 10*3/uL (ref 0.1–1.0)
Monocytes Relative: 9 %
NEUTROS PCT: 51 %
Neutro Abs: 1.2 10*3/uL — ABNORMAL LOW (ref 1.7–7.7)

## 2015-06-09 LAB — TROPONIN I: Troponin I: 6.21 ng/mL (ref <0.031)

## 2015-06-09 LAB — CBC
HEMATOCRIT: 21.3 % — AB (ref 39.0–52.0)
HEMOGLOBIN: 7.3 g/dL — AB (ref 13.0–17.0)
MCH: 32 pg (ref 26.0–34.0)
MCHC: 34.3 g/dL (ref 30.0–36.0)
MCV: 93.4 fL (ref 78.0–100.0)
Platelets: 20 10*3/uL — CL (ref 150–400)
RBC: 2.28 MIL/uL — AB (ref 4.22–5.81)
RDW: 17 % — ABNORMAL HIGH (ref 11.5–15.5)
WBC: 2.6 10*3/uL — AB (ref 4.0–10.5)

## 2015-06-09 LAB — PREPARE RBC (CROSSMATCH)

## 2015-06-09 LAB — ABO/RH: ABO/RH(D): A NEG

## 2015-06-09 MED ORDER — ASPIRIN 81 MG PO CHEW: 81.0000 mg | CHEWABLE_TABLET | Freq: Every day | ORAL | Status: DC

## 2015-06-09 MED ORDER — SODIUM CHLORIDE 0.9 % IV SOLN
Freq: Once | INTRAVENOUS | Status: AC
  Administered 2015-06-09: 13:00:00 via INTRAVENOUS

## 2015-06-09 MED ORDER — METOPROLOL TARTRATE 25 MG PO TABS
25.0000 mg | ORAL_TABLET | Freq: Three times a day (TID) | ORAL | Status: DC
  Administered 2015-06-09 – 2015-06-10 (×4): 25 mg via ORAL
  Filled 2015-06-09 (×4): qty 1

## 2015-06-09 MED ORDER — ACETAMINOPHEN 325 MG PO TABS
650.0000 mg | ORAL_TABLET | Freq: Once | ORAL | Status: DC
Start: 2015-06-09 — End: 2015-06-11

## 2015-06-09 MED ORDER — SODIUM CHLORIDE 0.9 % IV SOLN: Freq: Once | INTRAVENOUS | Status: DC

## 2015-06-09 MED ORDER — DIPHENHYDRAMINE HCL 25 MG PO CAPS: 25.0000 mg | ORAL_CAPSULE | Freq: Once | ORAL | Status: DC

## 2015-06-09 MED ORDER — ENSURE ENLIVE PO LIQD
237.0000 mL | Freq: Two times a day (BID) | ORAL | Status: DC
  Administered 2015-06-09 – 2015-06-10 (×3): 237 mL via ORAL

## 2015-06-09 MED ORDER — ATORVASTATIN CALCIUM 40 MG PO TABS
80.0000 mg | ORAL_TABLET | Freq: Every day | ORAL | Status: DC
  Administered 2015-06-09 – 2015-06-10 (×2): 80 mg via ORAL
  Filled 2015-06-09 (×2): qty 2

## 2015-06-09 NOTE — Progress Notes (Signed)
PROGRESS NOTE  HAMSE HUPE [id number] DOB: 01-Apr-1941 DOA: 06/08/2015 PCP: [name], MD  Summary: 26 yom with a hx of recently diagnosed metastatic bladder CA presented with worsening dyspnea on exertion x3-4 weeks. While in the ED, CTA was negative. Labs revealed a mildly elevated BNP at 697 and a troponin of 5.11. He was admitted to the SDU and noted to be in atrial fibrillation ( a new diagnosis ). Patient has been started on a Cardizem drip with improvement in his HR.   Assessment/Plan: 1. NSTEMI. Asymptomatic. Troponin trending down, now 6.21. Cardiology has been consulted and recommends ECHO. Will continue NTG patch. Heparin and ASA have been discontinued secondary to pancytopenia.  2. Atrial fibrillation with RVR, now in SR.  CHADSVASC score of 4. Rate is controlled on Cardizem drip. Will begin to wean and transition to PO Cardizem.  Follow up ECHO. Not a candidate for anticoagulation secondary to pancytopenia. 3. Hypertension, stable. Continue metoprolol.  4. Metastatic urothelial carcinoma, currently on chemotherapy. Oncology has been consulted. 5. Antineoplastic chemotherapy induced pancytopenia. Will continue to follow.  6. Subclinical COPD   Overall improved. Follow up ECHO. D/w Dr. Lennie Muckle ASA or heparin. Recommends transfusion.  Will transfuse 2U PRBCs given NSTEMI and anemia. Patient agrees. Repeat CBC in am.   Code Status: Full DVT prophylaxis: Heparin  Family Communication: No family at bedside. Disposition Plan: move to medical floor next 24 hours  Murray Hodgkins, MD  [facility]  Pager [phone] If 7PM-7AM, please contact night-coverage at [email or web address], password [facility] 06/09/2015, 6:24 AM  LOS: 1 day   Consultants:  Cardiology  Procedures:    Antibiotics:    HPI/Subjective: Feels much improved. Denies any CP, SOB, nausea, vomiting, or diaphoresis. Has a good appetite and ate breakfast.   Objective: Filed Vitals:   06/09/15 0200  06/09/15 0300 06/09/15 0400 06/09/15 0500  BP: 99/63 107/65 99/65 113/67  Pulse: 82 82 79 85  Temp:   98.2 F (36.8 C)   TempSrc:   Oral   Resp: 15 11 15 18   Height:      Weight:    126.6 kg (279 lb 1.6 oz)  SpO2: 95% 93% 93% 96%    Intake/Output Summary (Last 24 hours) at 06/09/15 0624 Last data filed at 06/09/15 0500  Gross per 24 hour  Intake    480 ml  Output    850 ml  Net   -370 ml     Filed Weights   06/08/15 1155 06/08/15 2000 06/09/15 0500  Weight: 125.3 kg (276 lb 3.8 oz) 125.3 kg (276 lb 3.8 oz) 126.6 kg (279 lb 1.6 oz)    Exam:     VSS, afebrile, not hypoxic General:  Appears calm and comfortable Eyes: PERRL, normal lids, irises & conjunctiva ENT: grossly normal hearing, lips & tongue Neck: no LAD, masses or thyromegaly Cardiovascular: RRR, no m/r/g. No LE edema. Telemetry: SR, no arrhythmias  Respiratory: CTA bilaterally, no w/r/r. Normal respiratory effort. Abdomen: soft, ntnd Musculoskeletal: grossly normal tone BUE/BLE Psychiatric: grossly normal mood and affect, speech fluent and appropriate Neurologic: grossly non-focal.  New data reviewed:  Troponin trending down 6.21  Hgb 7.3  Platelets 20  Pertinent data since admission:  BNP 657  Troponin 5.11  Platelets 33    Scheduled Meds: . nitroGLYCERIN  0.5 inch Topical 4 times per day  . sodium chloride  3 mL Intravenous Q12H   Continuous Infusions: . diltiazem (CARDIZEM) infusion 15 mg/hr (06/08/15 2257)    Principal Problem:  NSTEMI (non-ST elevated myocardial infarction) [facility]) Active Problems:   Metastatic urothelial carcinoma ([facility])   Dyspnea   Elevated troponin   Atrial fibrillation with rapid ventricular response ([place])   Antineoplastic chemotherapy induced pancytopenia ([place])   Time spent 25 minutes   By signing my name below, I, Rosalie Doctor attest that this documentation has been prepared under the direction and in the presence of Murray Hodgkins,  MD Electronically signed: Rosalie Doctor, Scribe.  06/09/2015 9:11am  I personally performed the services described in this documentation. All medical record entries made by the scribe were at my direction. I have reviewed the chart and agree that the record reflects my personal performance and is accurate and complete. Murray Hodgkins, MD

## 2015-06-09 NOTE — Consult Note (Signed)
[facility]  Name: [facility]      MRN: [id number]    Location: [facility]12/[facility]12-01  Date: 06/09/2015 Time:4:59 PM   REFERRING PHYSICIAN:  Murray Hodgkins, MD  REASON FOR CONSULT:  Pancytopenia   DIAGNOSIS:  Pancytopenia in Harry setting of Stage IV urothelial cancer, under active treatment.  HISTORY OF PRESENT ILLNESS:   [facility] is a 75 year old white American man who is well known to [facility] [employer] [facility] where is actively undergoing systemic chemotherapy in Harry palliative setting with Carboplatin/Gemcitabine in a Day 1, 8 fashion.    Metastatic urothelial carcinoma ([place])   04/02/2015 Imaging CT abd/pelvis- Innumberable hepatic masses, B/L adrenal mets, abdominopelvic lymphadenopathy, LLL lung lesion 6 mm, Lytic lesion of right L2 vertebral bosy, circumferential wall thickening of lower thoracic esophagus, mild diffuse bladder wall thickening.   04/02/2015 Pathology Results Urine Cytology- cells present suspicious for malignancy.  Suspicious for high-grade urothelial carcinoma.   04/08/2015 Procedure US guided biopsy of hepatic lesion   04/08/2015 Pathology Results Liver, needle/core biopsy, right - METASTATIC HIGH GRADE CARCINOMA,   04/14/2015 -  Chemotherapy Carboplatin/Gemcitabine Days 1, 8 every 21 days   05/25/2015 Imaging CT with interval response to therapy, improvement in multifocal liver mets, improvement in enlarged abdominal and pelvic adenopahty and bilateral adrenal gland metastatese, stable appearance of lytic lesion in L2   06/02/2015 Miscellaneous Xgeva to reduce Harry risk of SLE.  120 mg SQ every month.    He is day 15 of cycle #3.  Of note, when he was seen 2 weeks ago in [facility] clinic on 05/26/2015 he reported chest pain which he felt was from heartburn.  At that time, an EKG was performed in [facility] clinic and compared to his EKG performed by his primary care provider at [facility].  No changes on EKG noted between Harry two exams.  He was treated  symptomatically for GERD.  When he returned for follow-up on 06/02/2015 he noted resolution of his chest discomfort with an increased PPI.  Given this chest pain, we deleted Neulasta from cycle #3 in Harry event that his chest pain was Neulasta-induced sternal pain.  He is now admitted with NSTEMI.  I personally reviewed and went over radiographic studies with Harry patient.  Harry results are noted within this dictation.    I personally reviewed and went over laboratory results with Harry patient.  Harry results are noted within this dictation. Anemia is noted and he is S/P 2 unit PRBC.  His platelet count is 20,000 and therefore, I will order 1 unit of pheresed platelets.  Chart reviewed.  PAST MEDICAL HISTORY:   Past Medical History  Diagnosis Date  . GERD (gastroesophageal reflux disease)   . Hypokalemia   . Hypertension   . Cataract   . Arthritis   . Macular degeneration, age related   . DVT (deep venous thrombosis) ([facility])     developed after traveling x 1, developed another after left foot surgery  . Hydrocele in adult   . Metastatic urothelial carcinoma ([facility]) 04/12/2015    ALLERGIES: Allergies  Allergen Reactions  . Rabeprazole     Other reaction(s): DIARRHEA  . Vancomycin Hives and Rash      MEDICATIONS: I have reviewed Harry patient's current medications.     PAST SURGICAL HISTORY Past Surgical History  Procedure Laterality Date  . Replacement total knee    . Appendectomy    . Hammer toe surgery Left   . Lapidus procedure    .  Hallux fusion    . Colonoscopy  03/31/05  . Colonoscopy N/A 10/02/2014    GYK:ZLDJTTS diverticulosis  . Esophagogastroduodenoscopy N/A 10/02/2014    VXB:LTJQZ ulcerative reflux/s/p dilation/large HH  . Maloney dilation N/A 10/02/2014    Procedure: Venia Minks DILATION;  Surgeon: Daneil Dolin, MD;  Location: [facility];  Service: Endoscopy;  Laterality: N/A;  . Esophagogastroduodenoscopy N/A 01/29/2015    Procedure: ESOPHAGOGASTRODUODENOSCOPY (EGD);   Surgeon: Daneil Dolin, MD;  Location: [facility];  Service: Endoscopy;  Laterality: N/A;  0800-rescheduled to 9/9 @ 915 Candy notified pt  . Esophageal dilation  01/29/2015    Procedure: ESOPHAGEAL DILATION;  Surgeon: Daneil Dolin, MD;  Location: [facility];  Service: Endoscopy;;  . Portacath placement Left 04/13/15    FAMILY HISTORY: Family History  Problem Relation Age of Onset  . Colon cancer Maternal Uncle   . Heart disease Father     SOCIAL HISTORY:  reports that he quit smoking about 31 years ago. His smoking use included Cigarettes. He has a 31 pack-year smoking history. He has never used smokeless tobacco. He reports that he does not drink alcohol or use illicit drugs.  PERFORMANCE STATUS: Harry patient's performance status is 1 - Symptomatic but completely ambulatory  PHYSICAL EXAM: Most Recent Vital Signs: Blood pressure 111/70, pulse 91, temperature 97.6 F (36.4 C), temperature source Oral, resp. rate 22, height '6\' 4"'$  (1.93 m), weight 279 lb 1.6 oz (126.6 kg), SpO2 99 %. General appearance: alert, cooperative, appears stated age, no distress and in bed, no family at Harry bedside, pleasant, smiling Head: Normocephalic, without obvious abnormality, atraumatic Eyes: negative findings: lids and lashes normal, conjunctivae and sclerae normal and corneas clear Neck: no adenopathy and supple, symmetrical, trachea midline Back: symmetric, no curvature. ROM normal. No CVA tenderness. Heart: regular rate and rhythm Abdomen: normal findings: bowel sounds normal, no bruits heard, no masses palpable, soft, non-tender, spleen non-palpable, symmetric and umbilicus normal Extremities: extremities normal, atraumatic, no cyanosis or edema Skin: Skin color, texture, turgor normal. No rashes or lesions Neurologic: Grossly normal  LABORATORY DATA:  Results for orders placed or performed during [facility] encounter of 06/08/15 (from Harry past 48 hour(s))  CBC with Differential      Status: Abnormal   Collection Time: 06/08/15  8:30 AM  Result Value Ref Range   WBC 2.0 (L) 4.0 - 10.5 K/uL   RBC 2.52 (L) 4.22 - 5.81 MIL/uL   Hemoglobin 8.2 (L) 13.0 - 17.0 g/dL   HCT 23.8 (L) 39.0 - 52.0 %   MCV 94.4 78.0 - 100.0 fL   MCH 32.5 26.0 - 34.0 pg   MCHC 34.5 30.0 - 36.0 g/dL   RDW 16.9 (H) 11.5 - 15.5 %   Platelets 33 (L) 150 - 400 K/uL    Comment: SPECIMEN CHECKED FOR CLOTS PLATELET COUNT CONFIRMED BY SMEAR    Neutrophils Relative % 66 %   Neutro Abs 1.3 (L) 1.7 - 7.7 K/uL   Lymphocytes Relative 30 %   Lymphs Abs 0.6 (L) 0.7 - 4.0 K/uL   Monocytes Relative 4 %   Monocytes Absolute 0.1 0.1 - 1.0 K/uL   Eosinophils Relative 0 %   Eosinophils Absolute 0.0 0.0 - 0.7 K/uL   Basophils Relative 0 %   Basophils Absolute 0.0 0.0 - 0.1 K/uL   Smear Review PLATELETS APPEAR DECREASED   Basic metabolic panel     Status: Abnormal   Collection Time: 06/08/15  8:30 AM  Result Value  Ref Range   Sodium 140 135 - 145 mmol/L   Potassium 3.9 3.5 - 5.1 mmol/L   Chloride 106 101 - 111 mmol/L   CO2 26 22 - 32 mmol/L   Glucose, Bld 120 (H) 65 - 99 mg/dL   BUN 29 (H) 6 - 20 mg/dL   Creatinine, Ser 1.14 0.61 - 1.24 mg/dL   Calcium 8.2 (L) 8.9 - 10.3 mg/dL   GFR calc non Af Amer >60 >60 mL/min   GFR calc Af Amer >60 >60 mL/min    Comment: (NOTE) Harry eGFR has been calculated using Harry CKD EPI equation. This calculation has not been validated in all clinical situations. eGFR's persistently <60 mL/min signify possible Chronic Kidney Disease.    Anion gap 8 5 - 15  Troponin I     Status: Abnormal   Collection Time: 06/08/15  8:30 AM  Result Value Ref Range   Troponin I 5.11 (HH) <0.031 ng/mL    Comment: CRITICAL RESULT CALLED TO, READ BACK BY AND VERIFIED WITH: WHITE,M AT 9:05AM ON 06/08/15 BY FESTERMAN,C        POSSIBLE MYOCARDIAL ISCHEMIA. SERIAL TESTING RECOMMENDED.   Brain natriuretic peptide     Status: Abnormal   Collection Time: 06/08/15  8:30 AM  Result Value Ref  Range   B Natriuretic Peptide 657.0 (H) 0.0 - 100.0 pg/mL  Urinalysis, Routine w reflex microscopic (not at [facility])     Status: Abnormal   Collection Time: 06/08/15  8:54 AM  Result Value Ref Range   Color, Urine ORANGE (A) YELLOW    Comment: BIOCHEMICALS MAY BE AFFECTED BY COLOR   APPearance HAZY (A) CLEAR   Specific Gravity, Urine 1.020 1.005 - 1.030   pH 6.0 5.0 - 8.0   Glucose, UA NEGATIVE NEGATIVE mg/dL   Hgb urine dipstick MODERATE (A) NEGATIVE   Bilirubin Urine NEGATIVE NEGATIVE   Ketones, ur NEGATIVE NEGATIVE mg/dL   Protein, ur 30 (A) NEGATIVE mg/dL   Nitrite NEGATIVE NEGATIVE   Leukocytes, UA NEGATIVE NEGATIVE  Urine microscopic-add on     Status: Abnormal   Collection Time: 06/08/15  8:54 AM  Result Value Ref Range   Squamous Epithelial / LPF NONE SEEN NONE SEEN   WBC, UA NONE SEEN 0 - 5 WBC/hpf   RBC / HPF TOO NUMEROUS TO COUNT 0 - 5 RBC/hpf   Bacteria, UA FEW (A) NONE SEEN  MRSA PCR Screening     Status: None   Collection Time: 06/08/15 12:00 PM  Result Value Ref Range   MRSA by PCR NEGATIVE NEGATIVE    Comment:        Harry GeneXpert MRSA Assay (FDA approved for NASAL specimens only), is one component of a comprehensive MRSA colonization surveillance program. It is not intended to diagnose MRSA infection nor to guide or monitor treatment for MRSA infections.   Troponin I     Status: Abnormal   Collection Time: 06/08/15  5:06 PM  Result Value Ref Range   Troponin I 8.65 (HH) <0.031 ng/mL    Comment:        POSSIBLE MYOCARDIAL ISCHEMIA. SERIAL TESTING RECOMMENDED. CRITICAL VALUE NOTED.  VALUE IS CONSISTENT WITH PREVIOUSLY REPORTED AND CALLED VALUE.   TSH     Status: None   Collection Time: 06/08/15  5:06 PM  Result Value Ref Range   TSH 1.723 0.350 - 4.500 uIU/mL  T4, free     Status: Abnormal   Collection Time: 06/08/15  5:06 PM  Result Value Ref  Range   Free T4 1.19 (H) 0.61 - 1.12 ng/dL    Comment: Performed at [facility]  Troponin I      Status: Abnormal   Collection Time: 06/08/15 10:47 PM  Result Value Ref Range   Troponin I 8.68 (HH) <0.031 ng/mL    Comment:        POSSIBLE MYOCARDIAL ISCHEMIA. SERIAL TESTING RECOMMENDED. CRITICAL RESULT CALLED TO, READ BACK BY AND VERIFIED WITH: Earlie Server AT 2323 ON [id number] BY FORSYTH K   Heparin level (unfractionated)     Status: Abnormal   Collection Time: 06/08/15 10:47 PM  Result Value Ref Range   Heparin Unfractionated <0.10 (L) 0.30 - 0.70 IU/mL    Comment:        IF HEPARIN RESULTS ARE BELOW EXPECTED VALUES, AND PATIENT DOSAGE HAS BEEN CONFIRMED, SUGGEST FOLLOW UP TESTING OF ANTITHROMBIN III LEVELS.   Troponin I     Status: Abnormal   Collection Time: 06/09/15  4:40 AM  Result Value Ref Range   Troponin I 6.21 (HH) <0.031 ng/mL    Comment: CRITICAL VALUE NOTED.  VALUE IS CONSISTENT WITH PREVIOUSLY REPORTED AND CALLED VALUE.        POSSIBLE MYOCARDIAL ISCHEMIA. SERIAL TESTING RECOMMENDED.   Comprehensive metabolic panel     Status: Abnormal   Collection Time: 06/09/15  4:40 AM  Result Value Ref Range   Sodium 138 135 - 145 mmol/L   Potassium 3.6 3.5 - 5.1 mmol/L   Chloride 105 101 - 111 mmol/L   CO2 26 22 - 32 mmol/L   Glucose, Bld 105 (H) 65 - 99 mg/dL   BUN 24 (H) 6 - 20 mg/dL   Creatinine, Ser 1.08 0.61 - 1.24 mg/dL   Calcium 7.9 (L) 8.9 - 10.3 mg/dL   Total Protein 5.1 (L) 6.5 - 8.1 g/dL   Albumin 2.5 (L) 3.5 - 5.0 g/dL   AST 49 (H) 15 - 41 U/L   ALT 42 17 - 63 U/L   Alkaline Phosphatase 95 38 - 126 U/L   Total Bilirubin 1.1 0.3 - 1.2 mg/dL   GFR calc non Af Amer >60 >60 mL/min   GFR calc Af Amer >60 >60 mL/min    Comment: (NOTE) Harry eGFR has been calculated using Harry CKD EPI equation. This calculation has not been validated in all clinical situations. eGFR's persistently <60 mL/min signify possible Chronic Kidney Disease.    Anion gap 7 5 - 15  CBC     Status: Abnormal   Collection Time: 06/09/15  4:40 AM  Result Value Ref Range   WBC 2.6 (L)  4.0 - 10.5 K/uL   RBC 2.28 (L) 4.22 - 5.81 MIL/uL   Hemoglobin 7.3 (L) 13.0 - 17.0 g/dL   HCT 21.3 (L) 39.0 - 52.0 %   MCV 93.4 78.0 - 100.0 fL   MCH 32.0 26.0 - 34.0 pg   MCHC 34.3 30.0 - 36.0 g/dL   RDW 17.0 (H) 11.5 - 15.5 %   Platelets 20 (LL) 150 - 400 K/uL    Comment: SPECIMEN CHECKED FOR CLOTS PLATELET COUNT CONFIRMED BY SMEAR CRITICAL RESULT CALLED TO, READ BACK BY AND VERIFIED WITH: Heber Cazenovia RN  ON [id number] AT 0555 BY RESSEGGER R   Differential     Status: Abnormal   Collection Time: 06/09/15  4:40 AM  Result Value Ref Range   Neutrophils Relative % 51 %   Neutro Abs 1.2 (L) 1.7 - 7.7 K/uL   Lymphocytes Relative 40 %  Lymphs Abs 0.9 0.7 - 4.0 K/uL   Monocytes Relative 9 %   Monocytes Absolute 0.2 0.1 - 1.0 K/uL   Eosinophils Relative 0 %   Eosinophils Absolute 0.0 0.0 - 0.7 K/uL   Basophils Relative 0 %   Basophils Absolute 0.0 0.0 - 0.1 K/uL  Prepare RBC     Status: None   Collection Time: 06/09/15 10:50 AM  Result Value Ref Range   Order Confirmation ORDER PROCESSED BY BLOOD BANK   Type and screen [facility]     Status: None (Preliminary result)   Collection Time: 06/09/15 10:50 AM  Result Value Ref Range   ABO/RH(D) A NEG    Antibody Screen NEG    Sample Expiration 06/12/2015    Unit Number [id number]    Blood Component Type RED CELLS,LR    Unit division 00    Status of Unit ISSUED    Transfusion Status OK TO TRANSFUSE    Crossmatch Result Compatible    Unit Number [id number]    Blood Component Type RED CELLS,LR    Unit division 00    Status of Unit ISSUED    Transfusion Status OK TO TRANSFUSE    Crossmatch Result Compatible   ABO/Rh     Status: None   Collection Time: 06/09/15 10:50 AM  Result Value Ref Range   ABO/RH(D) A NEG       RADIOGRAPHY: Ct Angio Chest Pe W/cm &/or Wo Cm  06/08/2015  CLINICAL DATA:  Shortness of breath, tachycardia starting 05/16/2015, metastatic urothelial cancer on chemotherapy EXAM: CT ANGIOGRAPHY  CHEST WITH CONTRAST TECHNIQUE: Multidetector CT imaging of Harry chest was performed using Harry standard protocol during bolus administration of intravenous contrast. Multiplanar CT image reconstructions and MIPs were obtained to evaluate Harry vascular anatomy. CONTRAST:  144m OMNIPAQUE IOHEXOL 350 MG/ML SOLN COMPARISON:  05/25/2015 FINDINGS: Images of Harry thoracic inlet are unremarkable. Emphysematous changes bilateral upper lobe again noted. Stable bilateral apical scarring right greater than left. There is no mediastinal hematoma or adenopathy. Small hiatal hernia. Harry study is of excellent technical quality. No pulmonary embolus is noted. A right hilar lymph node again noted measures 9 mm short-axis stable from prior exam. There is small bilateral pleural effusion. Mild atelectasis noted bilateral lower lobe posteriorly. Sagittal images of Harry spine shows degenerative changes thoracic spine. Diffuse osteopenia. No destructive bony lesions are noted. Minimal perihilar bronchitic changes. No segmental infiltrate or pulmonary edema. No destructive rib lesions are noted. Harry visualized upper abdomen is stable. No adrenal gland mass is noted. Atherosclerotic calcifications of coronary arteries again noted. Heart size is within normal limits. No pericardial effusion. Right IJ Port-A-Cath in place. Review of Harry MIP images confirms Harry above findings. IMPRESSION: 1. No pulmonary embolus is noted. 2. Again noted emphysematous changes bilateral upper lobe. 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 Again noted bilateral apical pleural parenchymal scarring right greater than left. 3. No mediastinal hematoma or adenopathy. 4. Stable borderline enlarged right hilar lymph node. 5. No infiltrate or pulmonary edema. Bilateral small pleural effusion with bilateral lower lobe posterior atelectasis. Electronically Signed   By: LLahoma CrockerM.D.   On: 06/08/2015 10:56       PATHOLOGY:  NONE  ASSESSMENT/PLAN:  Stage IV  Urothelial Carcinoma Pancytopenia secondary to chemotherapy NSTEMI  Pancytopenia secondary to anti-neoplastic chemotherapy consisting of Carboplatin/Gemcitabine, currently Day 15 of cycle #3.  Neulasta not given on day 9 of therapy given recent chest pain and differential including Neulasta-induced sternal pain.  S/P 2 unit PRBC transfusion today, 06/09/2015.  Platelet count is 20,000.  Order placed for transfusion of 1 unit pheresed platelets.  ANC is 1.2.  Not neutropenic and therefore no role for Neupogen.  Due to start cycle #4 on 06/16/2015.  Will hold treatment until cleared by cardiology to continue chemotherapy.  Will follow along while in [facility] and alter outpatient appointments accordingly.  NSTEMI with patient reporting chest pain on 05/26/2015 during office visit.  EKG changes not identified.  Chest pain improved with PPI dosing changes.  Resolved by follow-up appointment on 06/02/2015.  Neulasta not given on Day 9 of cycle #3 due to concern regarding possible Neulasta-induced sternal pain.  Followed by cardiology.   All questions were answered. Harry patient knows to call [facility] clinic with any problems, questions or concerns. We can certainly see Harry patient much sooner if necessary.  Patient and plan discussed with Dr. Ancil Linsey and she is in agreement with Harry aforementioned.    KEFALAS,THOMAS  06/09/2015 5:17 PM    As Above.Future chemotherapy dosing will have to be decreased secondary to severe pancytopenia. In addition, further chemotherapy will be delayed until cleared by cardiology.  Maintain hemoglobin > 10, platelets > 20K. Will follow-up in clinic one week post discharge for additional discussion and planning. Donald Pore MD

## 2015-06-09 NOTE — Consult Note (Addendum)
Reason for Consult:positive troponins, afib Referring Physician: PTH Cardiologist:new Dr. Donn Pierini is an 75 y.o. male.  HPI: This is a 75 year old male patient with no prior cardiac history who was undergoing chemotherapy for metastatic bladder CA. He presented to the emergency room with progressive dyspnea on exertion and exercise tolerance over the past month since starting chemo. He was doing Engineer, structural and swimming daily. Since Christmas day he developed exertional chest pain into left arm with dyspnea. He was taking antacids. This has progressively worsened and he finally came to ER when he developed orthopnea. He denies any palpitations. He wears a fitbit and HR's were in the 60's before chemo and then were in the 80's since. On arrival to the ER EKG showed normal sinus rhythm with nonspecific ST-T wave changes. His BNP was elevated at 697 and troponin 5.11, 8.65, 8.68, and 6.11. CT scan ruled out PE and showed emphysema. He later went into rapid atrial fibrillation which he has not had in the past. He has history of hypertension, family hx CAD(father died MI 59-smoker, ETOH), GERD, prior DVT after foot surgery. No history of DM or HLD.  Past Medical History  Diagnosis Date  . GERD (gastroesophageal reflux disease)   . Hypokalemia   . Hypertension   . Cataract   . Arthritis   . Macular degeneration, age related   . DVT (deep venous thrombosis) ([place])     developed after traveling x 1, developed another after left foot surgery  . Hydrocele in adult   . 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 Replacement total knee    . Appendectomy    . Hammer toe surgery Left   . Lapidus procedure    . Hallux fusion    . Colonoscopy  03/31/05  . Colonoscopy N/A 10/02/2014    TGG:YIRSWNI diverticulosis  . Esophagogastroduodenoscopy N/A 10/02/2014    OEV:OJJKK ulcerative reflux/s/p dilation/large HH  . Maloney  dilation N/A 10/02/2014    Procedure: Venia Minks DILATION;  Surgeon: Daneil Dolin, MD;  [facility];  Service: Endoscopy;  Laterality: N/A;  . Esophagogastroduodenoscopy N/A 01/29/2015    Procedure: ESOPHAGOGASTRODUODENOSCOPY (EGD);  Surgeon: Daneil Dolin, MD;  Location: [facility];  Service: Endoscopy;  Laterality: N/A;  0800-rescheduled to 9/9 @ 915 Candy notified pt  . Esophageal dilation  01/29/2015    Procedure: ESOPHAGEAL DILATION;  Surgeon: Daneil Dolin, MD;  Location: [facility];  Service: Endoscopy;;  . Portacath placement Left 04/13/15    Family History  Problem Relation Age of Onset  . Colon cancer Maternal Uncle   . Heart disease Father     Social History:  reports that he quit smoking about 31 years ago. His smoking use included Cigarettes. He has a 31 pack-year smoking history. He has never used smokeless tobacco. He reports that he does not drink alcohol or use illicit drugs.  Allergies:  Allergies  Allergen Reactions  . Rabeprazole     Other reaction(s): DIARRHEA  . Vancomycin Hives and Rash    Medications:  Scheduled Meds: . nitroGLYCERIN  0.5 inch Topical 4 times per day  . sodium chloride  3 mL Intravenous Q12H   Continuous Infusions: . diltiazem (CARDIZEM) infusion 5 mg/hr (06/09/15 0631)   PRN Meds:.ondansetron **OR** ondansetron (ZOFRAN) IV   Results for orders placed or performed during the hospital encounter of 06/08/15 (from the past 48 hour(s))  CBC with Differential  Status: Abnormal   Collection Time: 06/08/15  8:30 AM  Result Value Ref Range   WBC 2.0 (L) 4.0 - 10.5 K/uL   RBC 2.52 (L) 4.22 - 5.81 MIL/uL   Hemoglobin 8.2 (L) 13.0 - 17.0 g/dL   HCT 94.9 (L) 90.8 - 32.4 %   MCV 94.4 78.0 - 100.0 fL   MCH 32.5 26.0 - 34.0 pg   MCHC 34.5 30.0 - 36.0 g/dL   RDW 78.0 (H) 37.5 - 04.2 %   Platelets 33 (L) 150 - 400 K/uL    Comment: SPECIMEN CHECKED FOR CLOTS PLATELET COUNT CONFIRMED BY SMEAR    Neutrophils Relative % 66 %    Neutro Abs 1.3 (L) 1.7 - 7.7 K/uL   Lymphocytes Relative 30 %   Lymphs Abs 0.6 (L) 0.7 - 4.0 K/uL   Monocytes Relative 4 %   Monocytes Absolute 0.1 0.1 - 1.0 K/uL   Eosinophils Relative 0 %   Eosinophils Absolute 0.0 0.0 - 0.7 K/uL   Basophils Relative 0 %   Basophils Absolute 0.0 0.0 - 0.1 K/uL   Smear Review PLATELETS APPEAR DECREASED   Basic metabolic panel     Status: Abnormal   Collection Time: 06/08/15  8:30 AM  Result Value Ref Range   Sodium 140 135 - 145 mmol/L   Potassium 3.9 3.5 - 5.1 mmol/L   Chloride 106 101 - 111 mmol/L   CO2 26 22 - 32 mmol/L   Glucose, Bld 120 (H) 65 - 99 mg/dL   BUN 29 (H) 6 - 20 mg/dL   Creatinine, Ser 3.78 0.61 - 1.24 mg/dL   Calcium 8.2 (L) 8.9 - 10.3 mg/dL   GFR calc non Af Amer >60 >60 mL/min   GFR calc Af Amer >60 >60 mL/min    Comment: (NOTE) The eGFR has been calculated using the CKD EPI equation. This calculation has not been validated in all clinical situations. eGFR's persistently <60 mL/min signify possible Chronic Kidney Disease.    Anion gap 8 5 - 15  Troponin I     Status: Abnormal   Collection Time: 06/08/15  8:30 AM  Result Value Ref Range   Troponin I 5.11 (HH) <0.031 ng/mL    Comment: CRITICAL RESULT CALLED TO, READ BACK BY AND VERIFIED WITH: WHITE,M AT 9:05AM ON 06/08/15 BY FESTERMAN,C        POSSIBLE MYOCARDIAL ISCHEMIA. SERIAL TESTING RECOMMENDED.   Brain natriuretic peptide     Status: Abnormal   Collection Time: 06/08/15  8:30 AM  Result Value Ref Range   B Natriuretic Peptide 657.0 (H) 0.0 - 100.0 pg/mL  Urinalysis, Routine w reflex microscopic (not at [facility])     Status: Abnormal   Collection Time: 06/08/15  8:54 AM  Result Value Ref Range   Color, Urine ORANGE (A) YELLOW    Comment: BIOCHEMICALS MAY BE AFFECTED BY COLOR   APPearance HAZY (A) CLEAR   Specific Gravity, Urine 1.020 1.005 - 1.030   pH 6.0 5.0 - 8.0   Glucose, UA NEGATIVE NEGATIVE mg/dL   Hgb urine dipstick MODERATE (A) NEGATIVE   Bilirubin  Urine NEGATIVE NEGATIVE   Ketones, ur NEGATIVE NEGATIVE mg/dL   Protein, ur 30 (A) NEGATIVE mg/dL   Nitrite NEGATIVE NEGATIVE   Leukocytes, UA NEGATIVE NEGATIVE  Urine microscopic-add on     Status: Abnormal   Collection Time: 06/08/15  8:54 AM  Result Value Ref Range   Squamous Epithelial / LPF NONE SEEN NONE SEEN   WBC, UA NONE  SEEN 0 - 5 WBC/hpf   RBC / HPF TOO NUMEROUS TO COUNT 0 - 5 RBC/hpf   Bacteria, UA FEW (A) NONE SEEN  MRSA PCR Screening     Status: None   Collection Time: 06/08/15 12:00 PM  Result Value Ref Range   MRSA by PCR NEGATIVE NEGATIVE    Comment:        The GeneXpert MRSA Assay (FDA approved for NASAL specimens only), is one component of a comprehensive MRSA colonization surveillance program. It is not intended to diagnose MRSA infection nor to guide or monitor treatment for MRSA infections.   Troponin I     Status: Abnormal   Collection Time: 06/08/15  5:06 PM  Result Value Ref Range   Troponin I 8.65 (HH) <0.031 ng/mL    Comment:        POSSIBLE MYOCARDIAL ISCHEMIA. SERIAL TESTING RECOMMENDED. CRITICAL VALUE NOTED.  VALUE IS CONSISTENT WITH PREVIOUSLY REPORTED AND CALLED VALUE.   TSH     Status: None   Collection Time: 06/08/15  5:06 PM  Result Value Ref Range   TSH 1.723 0.350 - 4.500 uIU/mL  T4, free     Status: Abnormal   Collection Time: 06/08/15  5:06 PM  Result Value Ref Range   Free T4 1.19 (H) 0.61 - 1.12 ng/dL    Comment: Performed at [facility]  Troponin I     Status: Abnormal   Collection Time: 06/08/15 10:47 PM  Result Value Ref Range   Troponin I 8.68 (HH) <0.031 ng/mL    Comment:        POSSIBLE MYOCARDIAL ISCHEMIA. SERIAL TESTING RECOMMENDED. CRITICAL RESULT CALLED TO, READ BACK BY AND VERIFIED WITH: Ermalene Searing AT 2323 ON [id number] BY FORSYTH K   Heparin level (unfractionated)     Status: Abnormal   Collection Time: 06/08/15 10:47 PM  Result Value Ref Range   Heparin Unfractionated <0.10 (L) 0.30 - 0.70 IU/mL     Comment:        IF HEPARIN RESULTS ARE BELOW EXPECTED VALUES, AND PATIENT DOSAGE HAS BEEN CONFIRMED, SUGGEST FOLLOW UP TESTING OF ANTITHROMBIN III LEVELS.   Troponin I     Status: Abnormal   Collection Time: 06/09/15  4:40 AM  Result Value Ref Range   Troponin I 6.21 (HH) <0.031 ng/mL    Comment: CRITICAL VALUE NOTED.  VALUE IS CONSISTENT WITH PREVIOUSLY REPORTED AND CALLED VALUE.        POSSIBLE MYOCARDIAL ISCHEMIA. SERIAL TESTING RECOMMENDED.   Comprehensive metabolic panel     Status: Abnormal   Collection Time: 06/09/15  4:40 AM  Result Value Ref Range   Sodium 138 135 - 145 mmol/L   Potassium 3.6 3.5 - 5.1 mmol/L   Chloride 105 101 - 111 mmol/L   CO2 26 22 - 32 mmol/L   Glucose, Bld 105 (H) 65 - 99 mg/dL   BUN 24 (H) 6 - 20 mg/dL   Creatinine, Ser 6.81 0.61 - 1.24 mg/dL   Calcium 7.9 (L) 8.9 - 10.3 mg/dL   Total Protein 5.1 (L) 6.5 - 8.1 g/dL   Albumin 2.5 (L) 3.5 - 5.0 g/dL   AST 49 (H) 15 - 41 U/L   ALT 42 17 - 63 U/L   Alkaline Phosphatase 95 38 - 126 U/L   Total Bilirubin 1.1 0.3 - 1.2 mg/dL   GFR calc non Af Amer >60 >60 mL/min   GFR calc Af Amer >60 >60 mL/min    Comment: (NOTE) The eGFR  has been calculated using the CKD EPI equation. This calculation has not been validated in all clinical situations. eGFR's persistently <60 mL/min signify possible Chronic Kidney Disease.    Anion gap 7 5 - 15  CBC     Status: Abnormal   Collection Time: 06/09/15  4:40 AM  Result Value Ref Range   WBC 2.6 (L) 4.0 - 10.5 K/uL   RBC 2.28 (L) 4.22 - 5.81 MIL/uL   Hemoglobin 7.3 (L) 13.0 - 17.0 g/dL   HCT 21.3 (L) 39.0 - 52.0 %   MCV 93.4 78.0 - 100.0 fL   MCH 32.0 26.0 - 34.0 pg   MCHC 34.3 30.0 - 36.0 g/dL   RDW 17.0 (H) 11.5 - 15.5 %   Platelets 20 (LL) 150 - 400 K/uL    Comment: SPECIMEN CHECKED FOR CLOTS PLATELET COUNT CONFIRMED BY SMEAR CRITICAL RESULT CALLED TO, READ BACK BY AND VERIFIED WITH: Heber  RN  ON [id number] AT 0555 BY RESSEGGER R     Ct Angio  Chest Pe W/cm &/or Wo Cm  06/08/2015  CLINICAL DATA:  Shortness of breath, tachycardia starting 05/16/2015, metastatic urothelial cancer on chemotherapy EXAM: CT ANGIOGRAPHY CHEST WITH CONTRAST TECHNIQUE: Multidetector CT imaging of the chest was performed using the standard protocol during bolus administration of intravenous contrast. Multiplanar CT image reconstructions and MIPs were obtained to evaluate the vascular anatomy. CONTRAST:  15m OMNIPAQUE IOHEXOL 350 MG/ML SOLN COMPARISON:  05/25/2015 FINDINGS: Images of the thoracic inlet are unremarkable. Emphysematous changes bilateral upper lobe again noted. Stable bilateral apical scarring right greater than left. There is no mediastinal hematoma or adenopathy. Small hiatal hernia. The study is of excellent technical quality. No pulmonary embolus is noted. A right hilar lymph node again noted measures 9 mm short-axis stable from prior exam. There is small bilateral pleural effusion. Mild atelectasis noted bilateral lower lobe posteriorly. Sagittal images of the spine shows degenerative changes thoracic spine. Diffuse osteopenia. No destructive bony lesions are noted. Minimal perihilar bronchitic changes. No segmental infiltrate or pulmonary edema. No destructive rib lesions are noted. The visualized upper abdomen is stable. No adrenal gland mass is noted. Atherosclerotic calcifications of coronary arteries again noted. Heart size is within normal limits. No pericardial effusion. Right IJ Port-A-Cath in place. Review of the MIP images confirms the above findings. IMPRESSION: 1. No pulmonary embolus is noted. 2. Again noted emphysematous changes bilateral upper lobe. 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 Again noted bilateral apical pleural parenchymal scarring right greater than left. 3. No mediastinal hematoma or adenopathy. 4. Stable borderline enlarged right hilar lymph node. 5. No infiltrate or pulmonary edema. Bilateral small pleural effusion  with bilateral lower lobe posterior atelectasis. Electronically Signed   By: LLahoma CrockerM.D.   On: 06/08/2015 10:56    ROS  See HPI Eyes: Negative Ears:Negative for hearing loss, tinnitus Cardiovascular: Negative for palpitations,irregular heartbeat, near-syncope, and syncope,edema, claudication, cyanosis,.  Respiratory:   Negative for cough, hemoptysis, sputum production and wheezing.   Endocrine: Negative for cold intolerance and heat intolerance.  Hematologic/Lymphatic: Negative for adenopathy and bleeding problem. Does not bruise/bleed easily.  Musculoskeletal: Negative.   Gastrointestinal: Negative for nausea, vomiting, reflux, abdominal pain, diarrhea, constipation.   Genitourinary: Negative for bladder incontinence, dysuria, flank pain, frequency, hematuria, hesitancy, nocturia and urgency.  Neurological: Negative.  Allergic/Immunologic: Negative for environmental allergies.  Blood pressure 120/70, pulse 85, temperature 97.1 F (36.2 C), temperature source Oral, resp. rate 19, height '6\' 4"'$  (1.93 m), weight 279 lb 1.6 oz (  126.6 kg), SpO2 98 %. Physical Exam PHYSICAL EXAM: Well-nournished, in no acute distress. Neck: No JVD, HJR, Bruit, or thyroid enlargement Lungs: No tachypnea, clear without wheezing, rales, or rhonchi Cardiovascular: RRR, PMI not [id number],4/1 systolic murmur apex,no gallops, bruit, thrill, or heave. Abdomen: BS normal. Soft without organomegaly, masses, lesions or tenderness. Extremities: without cyanosis, clubbing or edema. Good distal pulses bilateral SKin: Warm, no lesions or rashes  Musculoskeletal: No deformities Neuro: no focal signs    Assessment/Plan: NSTEMI peak troponin 8.68, EKG without acute change. Get 2-Decho.. Not sure this is all from afib as he was in NSR when he came in and on EKG 05/26/15. Platelets only 33 on IV heparin  PAF with RVR new for patient: now in NSR on IV diltiazem and heparin. Was on metoprolol 100 mg daily at home. Can  probably stop diltiazem and either increase metoprolol or add diltiazem. 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  GERD  Ermalinda Barrios PA-C 06/09/2015, 7:55 AM   Attending Note Patient seen and discussed with PA Lenze, I agree with her docuemntation above. 75 yo male with history of metatstatic bladder cancer on chemotherapy, HTN, tobacco abuse admitted with SOB and chest pain. Cardiology is consulted for elevated troponin and afib with RVR.  From last onc note he has metastatic high grade urothelial cancer and is undergoing caroplain/gemcitabine chemo therapy. CT A/P 03/2015 showed innumerable liver masses, large bilateral adrenal masses, extensive abdominopelvic lymphadenoapthy, lung nodule, L2 lytic lesion, distal esophageal involvement, as well as 3 vessel CAD.    BNP 657, K 3.9, Cr 1.14, BUN 29, WBC 2, Hgb 8.2, Plt 33, TSH 1.7 Trop 5.11-->8.65-->8.68-->6.21 CT PE no PE, emphysematous changes.  EKG SR CT A/P 03/2015: incidental findings of 3 vessel CAD From onc notes he has had intermittent chest pain that improved with PPI. His previous CT scan does show distal esophageal wall thickening and lymphadenopathy, concern for esoph neoplasm.    NSTEMI, peak trop 8.68 now trending down. EKG without acute ischemic changes, echo is pending. Given his diffusely metastatic cancer and thrombocytopenia he is not a candidate for invasive testing. Conservative management is also limited by his significant pancytopenia, notable with platelts in low 30s. Would avoid ASA, plavix, and anti coag. He is currently pain free, electrically and hemodynamically stable with trending down troponin. We will start beta blocker and high dose statin, likely ACE-I tomorrow.  Would continue rate control for afib, we will  transition from dilt gtt to oral metoprolol. He has gone back to NSR this AM. Would not anticoagulate for his afib given his thrombocytopenia and diffuse metastatic disease which also increases risk of bleeding.Would recommend transfusion to Hgb of 9-10 in setting of NSTEMI.   Zandra Abts MD

## 2015-06-09 NOTE — Progress Notes (Signed)
[name] Progress Note Patient Name: Harry Franklin DOB: 03-31-1941 MRN: [id number]   Date of Service  06/09/2015  HPI/Events of Note  polat down  eICU Interventions  Dc hep, asa Add scd     Intervention Category Intermediate Interventions: Diagnostic test evaluation  Raylene Miyamoto. 06/09/2015, 6:08 AM

## 2015-06-09 NOTE — Progress Notes (Signed)
eLink Physician-Brief Progress Note Patient Name: Harry Franklin DOB: Sep 24, 1940 MRN: [id number]   Date of Service  06/09/2015  HPI/Events of Note  Noted trop, ECG neg St changes, heparin on going for concern ischemia, plat 33 k new  eICU Interventions  Sheran Luz keep lower hep level goals, add asa low dose and monitor     Intervention Category Major Interventions: Hemorrhage - evaluation and management  Zacarias Krauter J. 06/09/2015, 1:26 AM

## 2015-06-09 NOTE — Progress Notes (Signed)
Midlevel informed pt's troponin still elevated, rate and BP  controlled, NSR noted. No signs of distress. Will continue to monitor.

## 2015-06-09 NOTE — Progress Notes (Signed)
Pt's platelets are 20 this morning. Hospitalist informed.

## 2015-06-09 NOTE — Progress Notes (Signed)
Initial Nutrition Assessment  DOCUMENTATION CODES:  Obesity unspecified  INTERVENTION:  Ensure Enlive po BID, each supplement provides 350 kcal and 20 grams of protein  NUTRITION DIAGNOSIS:  Increased nutrient needs related to cancer and cancer related treatments as evidenced by estimated nutritional requirements for this condition  GOAL:  Patient will meet greater than or equal to 90% of their needs  MONITOR:  PO intake, Supplement acceptance, Labs  REASON FOR ASSESSMENT:  Malnutrition Screening Tool    ASSESSMENT:  75 y/o male PMHx GERD, HTN, and recently diagnosed metastic bladder cancer who has been on chemotherapy for 2 months. He presents with progressively worsening dyspnea and some sharp chest pain. Pt had elevated troponin and admitted for likely NSTEMI. Also has AFIB.   Pt reports that he has been eating extremely well recently despite his recent dx of cancer (04/02/15). In fact, pt thinks he is eating too well.   He eats 3 meals a day as well as occasional supplements. He took Preservision MVI as well as Calcium + Vit D at h me. He purchased equate, as his oncologists had reccommended a protein drink, though he did not drink them all too frequently. Pt has had occasional issues with nausea, nothing chronic. He also has had an odd sense of urgency with his BMs. He has not had constipation nor vomiting.   Last year, pt reports that he had decided to try to lose some weight. He was ~320 lbs at the beginning of last year. He started exercising and eating better. He initially thought that his weight loss was related to his healthy behavior changes. Unfortunately, he found out he had cancer which could have had a large impact on his weight loss. Since he has started treatment for the cancer he has regained some of the lost wt.   Pt was agreeable to Ensure while admitted.   NFPE: Swelling BLE. He says he has chronic swelling to left knee since replacement 6 years ago. No wasting  noted.   Diet Order:  Diet Heart Room service appropriate?: Yes; Fluid consistency:: Thin  Skin:  Dry  Last BM:  1/17  Height:  Ht Readings from Last 1 Encounters:  06/08/15 6\' 4"  (1.93 m)   Weight:  Wt Readings from Last 1 Encounters:  06/09/15 279 lb 1.6 oz (126.6 kg)   Wt Readings from Last 10 Encounters:  06/09/15 279 lb 1.6 oz (126.6 kg)  06/02/15 272 lb (123.378 kg)  05/26/15 270 lb (122.471 kg)  05/12/15 261 lb 9.6 oz (118.661 kg)  05/05/15 263 lb (119.296 kg)  04/23/15 259 lb 3.2 oz (117.572 kg)  04/14/15 266 lb 6.4 oz (120.838 kg)  04/13/15 268 lb (121.564 kg)  04/12/15 268 lb 6.4 oz (121.745 kg)  04/08/15 260 lb (117.935 kg)  Admitted at 276 lbs (125.45 kg).   Ideal Body Weight:  91.82 kg  BMI:  Body mass index is 33.99 kg/(m^2).  Estimated Nutritional Needs:  Kcal:  2250-2500 kcals  Protein:  110-129 g (1.2-1.4 g/kg bw) Fluid:  2.1 liters  EDUCATION NEEDS:  No education needs identified at this time  Burtis Junes RD, LDN Nutrition Pager: [phone] 06/09/2015 12:36 PM

## 2015-06-09 NOTE — Progress Notes (Signed)
Pt had only 480mL of output on my shift.  Pt also c/o left arm swelling.

## 2015-06-10 ENCOUNTER — Inpatient Hospital Stay (HOSPITAL_COMMUNITY): Payer: Medicare HMO

## 2015-06-10 DIAGNOSIS — I2510 Atherosclerotic heart disease of native coronary artery without angina pectoris: Secondary | ICD-10-CM

## 2015-06-10 LAB — PREPARE PLATELET PHERESIS: Unit division: 0

## 2015-06-10 LAB — CBC
HCT: 23.7 % — ABNORMAL LOW (ref 39.0–52.0)
HEMOGLOBIN: 8.1 g/dL — AB (ref 13.0–17.0)
MCH: 31.4 pg (ref 26.0–34.0)
MCHC: 34.2 g/dL (ref 30.0–36.0)
MCV: 91.9 fL (ref 78.0–100.0)
Platelets: 45 10*3/uL — ABNORMAL LOW (ref 150–400)
RBC: 2.58 MIL/uL — AB (ref 4.22–5.81)
RDW: 17 % — ABNORMAL HIGH (ref 11.5–15.5)
WBC: 3.3 10*3/uL — ABNORMAL LOW (ref 4.0–10.5)

## 2015-06-10 LAB — PREPARE RBC (CROSSMATCH)

## 2015-06-10 MED ORDER — PERFLUTREN LIPID MICROSPHERE
1.0000 mL | INTRAVENOUS | Status: AC | PRN
  Administered 2015-06-10: 5 mL via INTRAVENOUS
  Filled 2015-06-10: qty 10

## 2015-06-10 MED ORDER — FUROSEMIDE 10 MG/ML IJ SOLN
20.0000 mg | Freq: Once | INTRAMUSCULAR | Status: AC
  Administered 2015-06-10: 20 mg via INTRAVENOUS
  Filled 2015-06-10: qty 2

## 2015-06-10 MED ORDER — CARVEDILOL 3.125 MG PO TABS
6.2500 mg | ORAL_TABLET | Freq: Two times a day (BID) | ORAL | Status: DC
  Administered 2015-06-10 – 2015-06-11 (×2): 6.25 mg via ORAL
  Filled 2015-06-10 (×2): qty 2

## 2015-06-10 MED ORDER — SODIUM CHLORIDE 0.9 % IV SOLN
Freq: Once | INTRAVENOUS | Status: AC
  Administered 2015-06-10: 16:00:00 via INTRAVENOUS

## 2015-06-10 NOTE — Progress Notes (Signed)
PROGRESS NOTE  Harry Franklin [id number] DOB: May 21, 1941 DOA: 06/08/2015 PCP: [name], MD  Summary: 34 yom with a hx of recently diagnosed metastatic bladder CA presented with worsening dyspnea on exertion x3-4 weeks. While in the ED, CTA was negative. Labs revealed a mildly elevated BNP at 697 and a troponin of 5.11. He was admitted to the SDU and noted to be in atrial fibrillation ( a new diagnosis ). Patient has been started on a Cardizem drip with improvement in his HR.   Assessment/Plan: 1. NSTEMI. Asymptomatic. Cardiology following. ECHO as below. Will continue NTG patch. No heparin and ASA secondary to pancytopenia.  2. Atrial fibrillation with RVR, remains in NSR.  CHADSVASC score of 4. Now on oral agent. ECHO as below. Not a candidate for anticoagulation secondary to pancytopenia. 3. Hypertension, stable. Continue metoprolol.  4. Metastatic urothelial carcinoma, currently on chemotherapy. Oncology following and recommended transfusion of 1U pheresed platelets. He can resume chemotherapy once cleared by cardiology.  5. Antineoplastic chemotherapy induced pancytopenia. Improved s/p transfusion. WBC 3.3, Hgb 8.1, Platelets 45. 6. Subclinical COPD   Overall improved. Per Dr. Lennie Muckle ASA or heparin.   Move to medical floor today. Continue management as per cardiology.  Will transfuse an additional 2U PRBCs and repeat CBC in am.   Hopefully home next 48 hours   Code Status: Full DVT prophylaxis: Heparin  Family Communication: No family at bedside.  Disposition Plan: Move to medical floor today.   Murray Hodgkins, MD  [facility]  Pager [phone] If 7PM-7AM, please contact night-coverage at [email or web address], password [facility] 06/10/2015, 6:14 AM  LOS: 2 days   Consultants:  Cardiology  Oncology  Procedures:  Transfusion of 2U PRBCS and 1U Pheresed platelets  ECHO Study Conclusions  - Left ventricle: The cavity size was normal. Wall thickness  was normal. Systolic function was mildly to moderately reduced. The estimated ejection fraction was in the range of 40% to 45%. Features are consistent with a pseudonormal left ventricular filling pattern, with concomitant abnormal relaxation and increased filling pressure (grade 2 diastolic dysfunction). Doppler parameters are consistent with high ventricular filling pressure. - Regional wall motion abnormality: Hypokinesis of the mid anterior, basal-mid anteroseptal, apical septal, apical lateral, and apical myocardium. - Aortic valve: Mildly calcified annulus. Trileaflet; mildly thickened leaflets. Valve area (VTI): 2.81 cm^2. Valve area (Vmax): 2.81 cm^2. - Mitral valve: There was mild regurgitation. - Left atrium: The atrium was mildly dilated. - Technically difficult study. Recommend limtied study with echocontrast to better clarify LV function and wall motion.  Antibiotics:    HPI/Subjective: Denies CP. Had some SOB while getting up to brush his teeth yesterday. Breathing is better today.   Objective: Filed Vitals:   06/09/15 2100 06/09/15 2255 06/09/15 2330 06/10/15 0000  BP: 109/65  101/71 104/71  Pulse: 90 95  91  Temp:  98.2 F (36.8 C) 98.4 F (36.9 C)   TempSrc:  Oral Oral   Resp: 21 23  27   Height:      Weight:      SpO2: 95% 95%  96%    Intake/Output Summary (Last 24 hours) at 06/10/15 0614 Last data filed at 06/10/15 0600  Gross per 24 hour  Intake 2777.34 ml  Output    880 ml  Net 1897.34 ml     Filed Weights   06/08/15 1155 06/08/15 2000 06/09/15 0500  Weight: 125.3 kg (276 lb 3.8 oz) 125.3 kg (276 lb 3.8 oz) 126.6 kg (279 lb 1.6 oz)  Exam:     VSS, afebrile, not hypoxic General:  Appears calm and comfortable Cardiovascular: RRR, no m/r/g. No LE edema. Telemetry: SR, no arrhythmias  Respiratory: CTA bilaterally, no w/r/r. Normal respiratory effort. Abdomen: soft, ntnd Musculoskeletal: grossly normal tone  BUE/BLE. Left hand is edematous, non-pitting. Good radial pulse and hand is well perfused. Left forearm mildly edematous.  Psychiatric: grossly normal mood and affect, speech fluent and appropriate  New data reviewed:  WBC 3.3, Hgb 8.1, Platelets 45  ECHO noted  Pertinent data since admission:  BNP 657  Troponin 5.11  Platelets 33    Scheduled Meds: . sodium chloride   Intravenous Once  . acetaminophen  650 mg Oral Once  . atorvastatin  80 mg Oral q1800  . diphenhydrAMINE  25 mg Oral Once  . feeding supplement (ENSURE ENLIVE)  237 mL Oral BID BM  . metoprolol tartrate  25 mg Oral TID  . nitroGLYCERIN  0.5 inch Topical 4 times per day  . sodium chloride  3 mL Intravenous Q12H   Continuous Infusions: . diltiazem (CARDIZEM) infusion Stopped (06/09/15 1259)    Principal Problem:   NSTEMI (non-ST elevated myocardial infarction) ([place]) Active Problems:   Metastatic urothelial carcinoma (HCC)   Dyspnea   Elevated troponin   Atrial fibrillation with rapid ventricular response ([place])   Antineoplastic chemotherapy induced pancytopenia ([place])   Time spent: 25 minutes   By signing my name below, I, Rosalie Doctor attest that this documentation has been prepared under the direction and in the presence of Murray Hodgkins, MD Electronically signed: Rosalie Doctor, Scribe.  06/10/2015 8:49am  I personally performed the services described in this documentation. All medical record entries made by the scribe were at my direction. I have reviewed the chart and agree that the record reflects my personal performance and is accurate and complete. Murray Hodgkins, MD

## 2015-06-10 NOTE — Progress Notes (Signed)
Echo with contrast completed today. LVEF does appear mildy decreased at 40-45%, wall motion primarily apical. Findings could suggest a stress induced CM/Takotsubo, without invasive testing not able to distinguish from CAD related findings. Will treat medically and repeat echo in next few weeks to see if function has improved which would support stress induced CM as opposed to ischemic heart disease.      Zandra Abts MD

## 2015-06-10 NOTE — Progress Notes (Signed)
Subjective:   Mild SOB with walking, otherwise no complaints   Objective:   Temp:  [96.8 F (36 C)-98.4 F (36.9 C)] 97.2 F (36.2 C) (01/19 0800) Pulse Rate:  [82-113] 106 (01/19 0827) Resp:  [17-27] 27 (01/19 0000) BP: (101-125)/(60-80) 115/80 mmHg (01/19 0827) SpO2:  [95 %-100 %] 96 % (01/19 0000) Weight:  [276 lb 3.8 oz (125.3 kg)] 276 lb 3.8 oz (125.3 kg) (01/19 0500) Last BM Date: 06/08/15  Filed Weights   06/08/15 2000 06/09/15 0500 06/10/15 0500  Weight: 276 lb 3.8 oz (125.3 kg) 279 lb 1.6 oz (126.6 kg) 276 lb 3.8 oz (125.3 kg)    Intake/Output Summary (Last 24 hours) at 06/10/15 0852 Last data filed at 06/10/15 0600  Gross per 24 hour  Intake 2200.34 ml  Output   1080 ml  Net 1120.34 ml    Telemetry: NSR  Exam:  General: NAD  HEENT: sclera clear, mucous membranes moist, normocephalic  Resp: CTAB  Cardiac: RRR, no m/r/g, no jvd  GI: abdomen soft, NT, ND  MSK: no LE edema. No cyanosis. Left hand swelling  Neuro: no focal deficits  Psych: appropriate affect  Lab Results:  Basic Metabolic Panel:  Recent Labs Lab 06/08/15 0830 06/09/15 0440  NA 140 138  K 3.9 3.6  CL 106 105  CO2 26 26  GLUCOSE 120* 105*  BUN 29* 24*  CREATININE 1.14 1.08  CALCIUM 8.2* 7.9*    Liver Function Tests:  Recent Labs Lab 06/09/15 0440  AST 49*  ALT 42  ALKPHOS 95  BILITOT 1.1  PROT 5.1*  ALBUMIN 2.5*    CBC:  Recent Labs Lab 06/08/15 0830 06/09/15 0440 06/10/15 0434  WBC 2.0* 2.6* 3.3*  HGB 8.2* 7.3* 8.1*  HCT 23.8* 21.3* 23.7*  MCV 94.4 93.4 91.9  PLT 33* 20* 45*    Cardiac Enzymes:  Recent Labs Lab 06/08/15 1706 06/08/15 2247 06/09/15 0440  TROPONINI 8.65* 8.68* 6.21*    BNP: No results for input(s): PROBNP in the last 8760 hours.  Coagulation: No results for input(s): INR in the last 168 hours.  ECG:   Medications:   Scheduled Medications: . sodium chloride   Intravenous Once  . acetaminophen  650 mg Oral Once   . atorvastatin  80 mg Oral q1800  . diphenhydrAMINE  25 mg Oral Once  . feeding supplement (ENSURE ENLIVE)  237 mL Oral BID BM  . metoprolol tartrate  25 mg Oral TID  . nitroGLYCERIN  0.5 inch Topical 4 times per day  . sodium chloride  3 mL Intravenous Q12H     Infusions: . diltiazem (CARDIZEM) infusion Stopped (06/09/15 1259)     PRN Medications:  ondansetron **OR** ondansetron (ZOFRAN) IV     Assessment/Plan   1. NSTEMI peak trop 8.68 now trending down. EKG without acute ischemic changes - echo somewhat limited images, he is to have repeat with echo contrast. Appears to have decreased LVEF around 40-45% with primarily apical hypokinesis - medical therapy with atorva 80, metoprolol. No ASA, plavix, or anticoag given his severe thrombocytopenia - Given his diffusely metastatic cancer and thrombocytopenia he is not a candidate for invasive testing - Conservative management is also limited by his significant pancytopenia, notable with platelts in low 30s. Would avoid ASA, plavix, and anti coag. He is currently pain free, electrically and hemodynamically stable with trending down troponin. - will change lopressor to coreg in setting of systolic dysfunction, start low dose ACE-I - if platelets stablize  around 50K would consider low dose aspirin at that time  2. Afib - rate control with beta blocker, weaned off dilt drip. No anticoagulation given metastatic cancer history with severe thrombocytopenia  3. Anemia - patient pancytopenic related to chemo, recommend transfuse to 9-10 in setting of ACS  4. Left arm swelling - recommend LUE DVT US.     Carlyle Dolly, M.D.

## 2015-06-11 ENCOUNTER — Other Ambulatory Visit: Payer: Self-pay

## 2015-06-11 DIAGNOSIS — I214 Non-ST elevation (NSTEMI) myocardial infarction: Secondary | ICD-10-CM

## 2015-06-11 DIAGNOSIS — I4891 Unspecified atrial fibrillation: Secondary | ICD-10-CM

## 2015-06-11 LAB — TYPE AND SCREEN
ABO/RH(D): A NEG
Antibody Screen: NEGATIVE
UNIT DIVISION: 0
Unit division: 0
Unit division: 0
Unit division: 0

## 2015-06-11 LAB — CBC
HEMATOCRIT: 27.5 % — AB (ref 39.0–52.0)
HEMOGLOBIN: 9.5 g/dL — AB (ref 13.0–17.0)
MCH: 31.6 pg (ref 26.0–34.0)
MCHC: 34.5 g/dL (ref 30.0–36.0)
MCV: 91.4 fL (ref 78.0–100.0)
Platelets: 41 10*3/uL — ABNORMAL LOW (ref 150–400)
RBC: 3.01 MIL/uL — ABNORMAL LOW (ref 4.22–5.81)
RDW: 17.5 % — AB (ref 11.5–15.5)
WBC: 2.8 10*3/uL — ABNORMAL LOW (ref 4.0–10.5)

## 2015-06-11 MED ORDER — CARVEDILOL 6.25 MG PO TABS
6.2500 mg | ORAL_TABLET | Freq: Two times a day (BID) | ORAL | Status: DC
Start: 2015-06-11 — End: 2015-06-25

## 2015-06-11 MED ORDER — CARVEDILOL 6.25 MG PO TABS: 6.2500 mg | ORAL_TABLET | Freq: Two times a day (BID) | ORAL | Status: DC

## 2015-06-11 MED ORDER — LISINOPRIL 5 MG PO TABS
2.5000 mg | ORAL_TABLET | Freq: Every day | ORAL | Status: DC
  Administered 2015-06-11: 2.5 mg via ORAL
  Filled 2015-06-11: qty 1

## 2015-06-11 MED ORDER — ATORVASTATIN CALCIUM 80 MG PO TABS: 80.0000 mg | ORAL_TABLET | Freq: Every day | ORAL | Status: DC

## 2015-06-11 MED ORDER — LISINOPRIL 2.5 MG PO TABS: 2.5000 mg | ORAL_TABLET | Freq: Every day | ORAL | Status: DC

## 2015-06-11 MED ORDER — HEPARIN SOD (PORK) LOCK FLUSH 100 UNIT/ML IV SOLN
500.0000 [IU] | INTRAVENOUS | Status: AC | PRN
Start: 2015-06-11 — End: 2015-06-11
  Administered 2015-06-11: 500 [IU]
  Filled 2015-06-11: qty 5

## 2015-06-11 NOTE — Progress Notes (Signed)
Discharge instruction reviewed with patient. No distress noted. Patient waiting for transportation home,

## 2015-06-11 NOTE — Discharge Summary (Signed)
Physician Discharge Summary  JACKSON GONYER [id number] DOB: [birth year] DOA: 06/08/2015  PCP: [name], MD  Admit date: 06/08/2015 Discharge date: 06/11/2015  Recommendations for Outpatient Follow-up:  1. Follow up with cardiologist, plan for repeat ECHO in a few weeks.     Follow-up Information    Follow up with Jory Sims, NP On 06/25/2015.   Specialties:  Nurse Practitioner, Radiology, Cardiology   Why:  1:30 PM   Contact information:   [address] [place] [phone]       Follow up with [facility]. Go on 06/24/2015.   Specialty:  Cardiology   Why:  Time arrive at 8:15 am for appointment 8:30 am for Echocardiogram   Contact information:   [place] [phone]      Follow up with [facility] On 06/25/2015.   Specialty:  Cardiology   Why:  Time 1:30 for follow up post Echocardiogram   Contact information:   [address] [place] [phone]      Discharge Diagnoses:  1. NSTEMI.  2. Atrial fibrillation with RVR . 3. Hypertension  4. Metastatic urothelial carcinoma, currently on chemotherapy.   5. Antineoplastic chemotherapy induced pancytopenia.  6. Subclinical COPD.  Discharge Condition: Improved Disposition: Home  Diet recommendation: Heart Healthy  Filed Weights   06/09/15 0500 06/10/15 0500 06/11/15 0500  Weight: 126.6 kg (279 lb 1.6 oz) 125.3 kg (276 lb 3.8 oz) 125.9 kg (277 lb 9 oz)    History of present illness:  89 yom with a hx of recently diagnosed metastatic bladder CA presented with worsening dyspnea on exertion x3-4 weeks. While in the ED, CTA was negative. Labs revealed a mildly elevated BNP at 697 and a troponin of 5.11. He was admitted to the SDU and noted to be in atrial fibrillation (a new diagnosis). Patient has been started on a Cardizem drip with improvement in his HR.   Hospital Course:  Patient presented with  worsening DOE, found to have an NSTEMI. Treated with beta-blocker and statin. No ASA or anticoagulation because of thrombocytopenia. Cardiology consulted. His troponin has trended down and he remains chest pain free.  Atrial fibrillation with RVR was transient; remains in SR. Antineoplastic chemotherapy induced pancytopenia improved with transfusion of 4U PRBCs and 1U of  platelets. He will f/u with cardiology as an outpatient.  Individual issues as below:  1. NSTEMI. Asymptomatic. No heparin and ASA secondary to pancytopenia. Coreg and statin, lisinopril. 2. Atrial fibrillation with RVR, remains in NSR. CHADSVASC score of 4. Not a candidate for anticoagulation secondary to pancytopenia. Coreg per cardiology. 3. Hypertension, stable.  4. Metastatic urothelial carcinoma, currently on chemotherapy. Oncology following and recommended transfusion of 1U pheresed platelets. He can resume chemotherapy once cleared by cardiology.  5. Antineoplastic chemotherapy induced pancytopenia. Hgb improved s/p transfusion. WBC 2.8, Hgb 9.5, Platelets 41. 6. Left hand edema, venous US negative. Swelling is much improved.  7. Subclinical COPD.   Overall improved. Per Dr. Lennie Muckle ASA or heparin. Continue Coreg, atorvastatin, lisinopril.  Consultants: 8. Cardiology 9. Oncology  Procedures:  Transfusion of 2U PRBCS and 1U Pheresed platelets 1/18  Transfused 2U PRBCs on 1/19  ECHO 1/18 Study Conclusions  - Left ventricle: The cavity size was normal. Wall thickness was normal. Systolic function was mildly to moderately reduced. The estimated ejection fraction was in the range of 40% to 45%. Features are consistent with a pseudonormal left ventricular filling pattern, with  concomitant abnormal relaxation and increased filling pressure (grade 2 diastolic dysfunction). Doppler parameters are consistent with high ventricular filling pressure. - Regional wall motion abnormality:  Hypokinesis of the mid anterior, basal-mid anteroseptal, apical septal, apical lateral, and apical myocardium. - Aortic valve: Mildly calcified annulus. Trileaflet; mildly thickened leaflets. Valve area (VTI): 2.81 cm^2. Valve area (Vmax): 2.81 cm^2. - Mitral valve: There was mild regurgitation. - Left atrium: The atrium was mildly dilated. - Technically difficult study. Recommend limtied study with echocontrast to better clarify LV function and wall motion.  ECHO Limited 1/19 Study Conclusions  - Left ventricle: Systolic function was mildly to moderately reduced. The estimated ejection fraction was in the range of 40% to 45%. - Regional wall motion abnormality: Hypokinesis of the mid-apical anterior, basal-mid anteroseptal, mid anterolateral, apical septal, apical lateral, and apical myocardium. - Limited study with echocontrast to evaluate LV function and wall motion.  Antibiotics:  None   Discharge Instructions Discharge Instructions    Activity as tolerated - No restrictions    Complete by:  As directed      Diet - low sodium heart healthy    Complete by:  As directed      Discharge instructions    Complete by:  As directed   Call your physician or seek immediate medical attention for chest pain, shortness of breath or worsening of condition.            Discharge Medication List as of 06/11/2015 10:37 AM    START taking these medications   Details  !! atorvastatin (LIPITOR) 80 MG tablet Take 1 tablet (80 mg total) by mouth daily at 6 PM., Starting 06/11/2015, Until Discontinued, Normal    !! atorvastatin (LIPITOR) 80 MG tablet Take 1 tablet (80 mg total) by mouth daily., Starting 06/11/2015, Until Discontinued, Print    !! carvedilol (COREG) 6.25 MG tablet Take 1 tablet (6.25 mg total) by mouth 2 (two) times daily with a meal., Starting 06/11/2015, Until Discontinued, Normal    !! carvedilol (COREG) 6.25 MG tablet Take 1 tablet (6.25 mg total)  by mouth 2 (two) times daily with a meal., Starting 06/11/2015, Until Discontinued, Print    !! lisinopril (PRINIVIL,ZESTRIL) 2.5 MG tablet Take 1 tablet (2.5 mg total) by mouth daily., Starting 06/11/2015, Until Discontinued, Normal    !! lisinopril (PRINIVIL,ZESTRIL) 2.5 MG tablet Take 1 tablet (2.5 mg total) by mouth daily., Starting 06/11/2015, Until Discontinued, Print     !! - Potential duplicate medications found. Please discuss with provider.    CONTINUE these medications which have NOT CHANGED   Details  Calcium Carb-Cholecalciferol 600-500 MG-UNIT CAPS Take 1 capsule by mouth 2 (two) times daily., Until Discontinued, Historical Med    calcium carbonate (TUMS EX) 750 MG chewable tablet Chew 2 tablets by mouth 2 (two) times daily as needed for heartburn., Until Discontinued, Historical Med    hydrocortisone cream 1 % Apply 1 application topically 2 (two) times daily as needed for itching., Until Discontinued, Historical Med    Multiple Vitamins-Minerals (PRESERVISION AREDS 2 PO) Take 1 tablet by mouth 2 (two) times daily., Until Discontinued, Historical Med    omeprazole (PRILOSEC) 40 MG capsule Take 40 mg by mouth 2 (two) times daily. , Until Discontinued, Historical Med    ondansetron (ZOFRAN) 8 MG tablet Take 1 tablet (8 mg total) by mouth every 8 (eight) hours as needed for nausea or vomiting., Starting 04/12/2015, Until Discontinued, Normal    oxyCODONE (OXY IR/ROXICODONE) 5 MG immediate release tablet Take  1-2 tablets (5-10 mg total) by mouth every 4 (four) hours as needed for severe pain., Starting 04/05/2015, Until Discontinued, Print      STOP taking these medications     aspirin 325 MG tablet      CARBOPLATIN IV      Gemcitabine HCl (GEMZAR IV)      lidocaine-prilocaine (EMLA) cream      metoprolol (LOPRESSOR) 100 MG tablet      prochlorperazine (COMPAZINE) 10 MG tablet        Allergies  Allergen Reactions  . Rabeprazole     Other reaction(s): DIARRHEA  .  Vancomycin Hives and Rash    The results of significant diagnostics from this hospitalization (including imaging, microbiology, ancillary and laboratory) are listed below for reference.    Significant Diagnostic Studies: Ct Angio Chest Pe W/cm &/or Wo Cm  06/08/2015  CLINICAL DATA:  Shortness of breath, tachycardia starting 05/16/2015, metastatic urothelial cancer on chemotherapy EXAM: CT ANGIOGRAPHY CHEST WITH CONTRAST TECHNIQUE: Multidetector CT imaging of the chest was performed using the standard protocol during bolus administration of intravenous contrast. Multiplanar CT image reconstructions and MIPs were obtained to evaluate the vascular anatomy. CONTRAST:  127mL OMNIPAQUE IOHEXOL 350 MG/ML SOLN COMPARISON:  05/25/2015 FINDINGS: Images of the thoracic inlet are unremarkable. Emphysematous changes bilateral upper lobe again noted. Stable bilateral apical scarring right greater than left. There is no mediastinal hematoma or adenopathy. Small hiatal hernia. The study is of excellent technical quality. No pulmonary embolus is noted. A right hilar lymph node again noted measures 9 mm short-axis stable from prior exam. There is small bilateral pleural effusion. Mild atelectasis noted bilateral lower lobe posteriorly. Sagittal images of the spine shows degenerative changes thoracic spine. Diffuse osteopenia. No destructive bony lesions are noted. Minimal perihilar bronchitic changes. No segmental infiltrate or pulmonary edema. No destructive rib lesions are noted. The visualized upper abdomen is stable. No adrenal gland mass is noted. Atherosclerotic calcifications of coronary arteries again noted. Heart size is within normal limits. No pericardial effusion. Right IJ Port-A-Cath in place. Review of the MIP images confirms the above findings. IMPRESSION: 1. No pulmonary embolus is noted. 2. Again noted emphysematous changes bilateral upper lobe. 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 Again noted  bilateral apical pleural parenchymal scarring right greater than left. 3. No mediastinal hematoma or adenopathy. 4. Stable borderline enlarged right hilar lymph node. 5. No infiltrate or pulmonary edema. Bilateral small pleural effusion with bilateral lower lobe posterior atelectasis. Electronically Signed   By: Lahoma Crocker M.D.   On: 06/08/2015 10:56    US Venous Img Upper Uni Left  06/10/2015  CLINICAL DATA:  Left upper extremity edema, pain EXAM: LEFT UPPER EXTREMITY VENOUS DOPPLER ULTRASOUND TECHNIQUE: Gray-scale sonography with graded compression, as well as color Doppler and duplex ultrasound were performed to evaluate the upper extremity deep venous system from the level of the subclavian vein and including the jugular, axillary, basilic, radial, ulnar and upper cephalic vein. Spectral Doppler was utilized to evaluate flow at rest and with distal augmentation maneuvers. COMPARISON:  None. FINDINGS: Contralateral Subclavian Vein: Respiratory phasicity is normal and symmetric with the symptomatic side. No evidence of thrombus. Normal compressibility. Internal Jugular Vein: No evidence of thrombus. Normal compressibility, respiratory phasicity and response to augmentation. Subclavian Vein: No evidence of thrombus. Normal compressibility, respiratory phasicity and response to augmentation. Axillary Vein: No evidence of thrombus. Normal compressibility, respiratory phasicity and response to augmentation. Cephalic Vein: No evidence of thrombus. Normal compressibility, respiratory phasicity and  response to augmentation. Basilic Vein: No evidence of thrombus. Normal compressibility, respiratory phasicity and response to augmentation. Brachial Veins: No evidence of thrombus. Normal compressibility, respiratory phasicity and response to augmentation. Radial Veins: No evidence of thrombus. Normal compressibility, respiratory phasicity and response to augmentation. Ulnar Veins: No evidence of thrombus. Normal  compressibility, respiratory phasicity and response to augmentation. Venous Reflux:  None visualized. Other Findings:  Forearm region subcutaneous edema noted. IMPRESSION: No evidence of deep venous thrombosis. Electronically Signed   By: Jerilynn Mages.  Shick M.D.   On: 06/10/2015 12:13    Microbiology: Recent Results (from the past 240 hour(s))  MRSA PCR Screening     Status: None   Collection Time: 06/08/15 12:00 PM  Result Value Ref Range Status   MRSA by PCR NEGATIVE NEGATIVE Final    Comment:        The GeneXpert MRSA Assay (FDA approved for NASAL specimens only), is one component of a comprehensive MRSA colonization surveillance program. It is not intended to diagnose MRSA infection nor to guide or monitor treatment for MRSA infections.      Labs: Basic Metabolic Panel:  Recent Labs Lab 06/08/15 0830 06/09/15 0440  NA 140 138  K 3.9 3.6  CL 106 105  CO2 26 26  GLUCOSE 120* 105*  BUN 29* 24*  CREATININE 1.14 1.08  CALCIUM 8.2* 7.9*   Liver Function Tests:  Recent Labs Lab 06/09/15 0440  AST 49*  ALT 42  ALKPHOS 95  BILITOT 1.1  PROT 5.1*  ALBUMIN 2.5*    CBC:  Recent Labs Lab 06/08/15 0830 06/09/15 0440 06/10/15 0434 06/11/15 0445  WBC 2.0* 2.6* 3.3* 2.8*  NEUTROABS 1.3* 1.2*  --   --   HGB 8.2* 7.3* 8.1* 9.5*  HCT 23.8* 21.3* 23.7* 27.5*  MCV 94.4 93.4 91.9 91.4  PLT 33* 20* 45* 41*   Cardiac Enzymes:  Recent Labs Lab 06/08/15 0830 06/08/15 1706 06/08/15 2247 06/09/15 0440  TROPONINI 5.11* 8.65* 8.68* 6.21*       Recent Labs  06/08/15 0830  BNP 657.0*      Principal Problem:   NSTEMI (non-ST elevated myocardial infarction) ([place]) Active Problems:   Metastatic urothelial carcinoma ([facility])   Dyspnea   Elevated troponin   Atrial fibrillation with rapid ventricular response ([place])   Antineoplastic chemotherapy induced pancytopenia ([place])   Time coordinating discharge: 35 minutes  Signed:  Murray Hodgkins, MD [facility] 06/11/2015, 8:27 AM   By signing my name below, I, Rosalie Doctor attest that this documentation has been prepared under the direction and in the presence of Murray Hodgkins, MD Electronically signed: Rosalie Doctor, Scribe.  06/11/2015   I personally performed the services described in this documentation. All medical record entries made by the scribe were at my direction. I have reviewed the chart and agree that the record reflects my personal performance and is accurate and complete. Murray Hodgkins, MD

## 2015-06-11 NOTE — Consult Note (Signed)
Consulting cardiologist: Carlyle Dolly MD Primary Cardiologist: Carlyle Dolly MD  Cardiology Specific Problem List: 1. Afib with RVR 2. NSTEMI   Subjective:    No complaints wants to go home.   Objective:   Temp:  [97 F (36.1 C)-98.2 F (36.8 C)] 97 F (36.1 C) (01/20 0400) Pulse Rate:  [84-110] 84 (01/20 0812) Resp:  [14-29] 15 (01/20 0400) BP: (95-131)/(59-80) 128/75 mmHg (01/20 0812) SpO2:  [95 %-100 %] 96 % (01/20 0400) Weight:  [277 lb 9 oz (125.9 kg)] 277 lb 9 oz (125.9 kg) (01/20 0500) Last BM Date: 06/08/15  Filed Weights   06/09/15 0500 06/10/15 0500 06/11/15 0500  Weight: 279 lb 1.6 oz (126.6 kg) 276 lb 3.8 oz (125.3 kg) 277 lb 9 oz (125.9 kg)    Intake/Output Summary (Last 24 hours) at 06/11/15 0852 Last data filed at 06/11/15 0615  Gross per 24 hour  Intake    883 ml  Output   3501 ml  Net  -2618 ml    Telemetry: NSR rates in the 80's and 90's. Not sure when he converted. Reviewed telemetry from last night. Having a lot of artifact. This am in NSR.   Exam:  General: No acute distress.  HEENT: Conjunctiva and lids normal, oropharynx clear.  Lungs: Bilateral crackles. No wheezes or rhonchi.   Cardiac: No elevated JVP or bruits. RRR, no gallop or rub.   Abdomen: Normoactive bowel sounds, nontender, nondistended.  Extremities: Non pitting edema, distal pulses full. Venous statis skin changes. He has mild edema of his left hand, with some scattered skin eruptions around his watch.  Neuropsychiatric: Alert and oriented x3, affect appropriate.   Lab Results:  Basic Metabolic Panel:  Recent Labs Lab 06/08/15 0830 06/09/15 0440  NA 140 138  K 3.9 3.6  CL 106 105  CO2 26 26  GLUCOSE 120* 105*  BUN 29* 24*  CREATININE 1.14 1.08  CALCIUM 8.2* 7.9*    Liver Function Tests:  Recent Labs Lab 06/09/15 0440  AST 49*  ALT 42  ALKPHOS 95  BILITOT 1.1  PROT 5.1*  ALBUMIN 2.5*    CBC:  Recent Labs Lab 06/09/15 0440  06/10/15 0434 06/11/15 0445  WBC 2.6* 3.3* 2.8*  HGB 7.3* 8.1* 9.5*  HCT 21.3* 23.7* 27.5*  MCV 93.4 91.9 91.4  PLT 20* 45* 41*    Cardiac Enzymes:  Recent Labs Lab 06/08/15 1706 06/08/15 2247 06/09/15 0440  TROPONINI 8.65* 8.68* 6.21*   Echocardiogram 06/10/2015 Left ventricle: Systolic function was mildly to moderately reduced. The estimated ejection fraction was in the range of 40% to 45%. - Regional wall motion abnormality: Hypokinesis of the mid-apical anterior, basal-mid anteroseptal, mid anterolateral, apical septal, apical lateral, and apical myocardium. - Limited study with echocontrast to evaluate LV function and wall motion.  Radiology: US Venous Img Upper Uni Left  06/10/2015  CLINICAL DATA:  Left upper extremity edema, pain EXAM: LEFT UPPER EXTREMITY VENOUS DOPPLER ULTRASOUND TECHNIQUE: Gray-scale sonography with graded compression, as well as color Doppler and duplex ultrasound were performed to evaluate the upper extremity deep venous system from the level of the subclavian vein and including the jugular, axillary, basilic, radial, ulnar and upper cephalic vein. Spectral Doppler was utilized to evaluate flow at rest and with distal augmentation maneuvers. COMPARISON:  None. FINDINGS: Contralateral Subclavian Vein: Respiratory phasicity is normal and symmetric with the symptomatic side. No evidence of thrombus. Normal compressibility. Internal Jugular Vein: No evidence of thrombus. Normal compressibility, respiratory phasicity and response  to augmentation. Subclavian Vein: No evidence of thrombus. Normal compressibility, respiratory phasicity and response to augmentation. Axillary Vein: No evidence of thrombus. Normal compressibility, respiratory phasicity and response to augmentation. Cephalic Vein: No evidence of thrombus. Normal compressibility, respiratory phasicity and response to augmentation. Basilic Vein: No evidence of thrombus. Normal compressibility,  respiratory phasicity and response to augmentation. Brachial Veins: No evidence of thrombus. Normal compressibility, respiratory phasicity and response to augmentation. Radial Veins: No evidence of thrombus. Normal compressibility, respiratory phasicity and response to augmentation. Ulnar Veins: No evidence of thrombus. Normal compressibility, respiratory phasicity and response to augmentation. Venous Reflux:  None visualized. Other Findings:  Forearm region subcutaneous edema noted. IMPRESSION: No evidence of deep venous thrombosis. Electronically Signed   By: Jerilynn Mages.  Shick M.D.   On: 06/10/2015 12:13     Medications:   Scheduled Medications: . sodium chloride   Intravenous Once  . acetaminophen  650 mg Oral Once  . atorvastatin  80 mg Oral q1800  . carvedilol  6.25 mg Oral BID WC  . diphenhydrAMINE  25 mg Oral Once  . feeding supplement (ENSURE ENLIVE)  237 mL Oral BID BM  . nitroGLYCERIN  0.5 inch Topical 4 times per day  . sodium chloride  3 mL Intravenous Q12H      PRN Medications: ondansetron **OR** ondansetron (ZOFRAN) IV   Assessment and Plan:   1.Atrial fib with RVR:  Now off of diltiazem and now on coreg 6.25 mg BID. HR is in the 90's at rest. BP normal. He is not an anticoagulation candidate due to metastatic CA. Can consider increasing the carvedilol and stopping lisinopril to give room for titration of BB. Follow up appointment made with cardiology for 2/3.2017.   2. NSTEMI: Troponin trending down, He has not chest pain or symptoms of dyspnea. He is not a candidate for invasive procedures. Will continue statin. No ASA due to thrombocytopenia. 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    3. Anemia:  Has received blood transfusion yesterday. Hgb is improved.   Phill Myron. [place] NP [employer] [place]  06/11/2015, 8:52 AM    Patient seen and discussed with NP Purcell Nails, I agree with her documentation. NSTEMI with peak troponin of 8.68, no significant EKG changes. Echo with LVEF 40-45% and  WMAs primarily involving the apex. He is chest pain free, SOB has resolved, and troponin pending down. 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 Not a candidate for invasive testing, noninvasive ischemic testing would not change management at this time. No ASA, plavix, or anticoag given his platelets have been persistently less than 50K, even despite transfusion numbers trending down again today. He is on atorva 80, coreg, and lisinopril. Would start ASA if platelets stabilize above 50K. There is some chance that as opposed to obstructive CAD as the etiology this could be a stress induced CM/Takotsubo, especially given the primarily apical hypokinesis seen on echo. Without invasive testing this cannot be distinguished, however repeat echo in the next few weeks should be done to see if there is resolution of the WMAs and normalization of function which would support a stress induced CM. Transient episode of afib now in NSR, no anticoag due to pancytopenia and metastatic CA.   From literature review cardiotoxicity of carboplatin/gemcitabine are not common, and primarily are described as case reports within the literature. At this time I do not believe these agents were directly responsible for the  cardiac event the patient had. The much more likely diagnosis is acute obstructive CAD vs stress induced CM. Would like to repeat his echo in 2 weeks, pending results would likely recommend resuming his regimen after that.    Zandra Abts MD

## 2015-06-11 NOTE — Care Management Important Message (Signed)
Important Message  Patient Details  Name: Harry Franklin MRN: [id number] Date of Birth: 25-Jan-1941   Medicare Important Message Given:  Yes    Sherald Barge, RN 06/11/2015, 9:18 AM

## 2015-06-11 NOTE — Care Management Note (Signed)
Case Management Note  Patient Details  Name: DEVRAJ BOAZ MRN: [id number] Date of Birth: 09/25/1940  Subjective/Objective:                  Pt admitted with NSTEMI. Pt is from home, lives with his wife and is ind with ADL's. Pt has wheelchair but does not require assistance with mobility.   Action/Plan: Pt plans to return home with self care. No CM needs.   Expected Discharge Date:     06/11/2015             Expected Discharge Plan:  Home/Self Care  In-House Referral:  NA  Discharge planning Services  CM Consult  Post Acute Care Choice:  NA Choice offered to:  NA  DME Arranged:    DME Agency:     HH Arranged:    HH Agency:     Status of Service:  Completed, signed off  Medicare Important Message Given:  Yes Date Medicare IM Given:    Medicare IM give by:    Date Additional Medicare IM Given:    Additional Medicare Important Message give by:     If discussed at [place] of Stay Meetings, dates discussed:    Additional Comments:  Sherald Barge, RN 06/11/2015, 9:18 AM

## 2015-06-11 NOTE — Progress Notes (Signed)
PROGRESS NOTE  ALOYSIOUS BALAN [id number] DOB: 13-Nov-1940 DOA: 06/08/2015 PCP: [name], MD  Summary: 13 yom with a hx of recently diagnosed metastatic bladder CA presented with worsening dyspnea on exertion x3-4 weeks. While in the ED, CTA was negative. Labs revealed a mildly elevated BNP at 697 and a troponin of 5.11. He was admitted to the SDU and noted to be in atrial fibrillation (a new diagnosis). Patient has been started on a Cardizem drip with improvement in his HR.   Assessment/Plan: 1. NSTEMI. Asymptomatic. No heparin and ASA secondary to pancytopenia. Coreg and statin, lisinopril. 2. Atrial fibrillation with RVR, remains in NSR. CHADSVASC score of 4. Not a candidate for anticoagulation secondary to pancytopenia. Coreg per cardiology. 3. Hypertension, stable.  4. Metastatic urothelial carcinoma, currently on chemotherapy. Oncology following and recommended transfusion of 1U pheresed platelets. He can resume chemotherapy once cleared by cardiology.  5. Antineoplastic chemotherapy induced pancytopenia. Hgb improved s/p transfusion. WBC 2.8, Hgb 9.5, Platelets 41. 6. Left hand edema, venous US negative. Swelling is much improved.  7. Subclinical COPD.   Overall improved. Per Dr. Lennie Muckle ASA or heparin. Continue Coreg, atorvastatin, lisinopril.  Discharge home today.   Code Status: Full DVT prophylaxis: Heparin  Family Communication: No family at bedside.  Disposition Plan: Discharge home today.   Murray Hodgkins, MD  [facility]  Pager [phone] If 7PM-7AM, please contact night-coverage at [email or web address], password [facility] 06/11/2015, 6:28 AM  LOS: 3 days   Consultants:  Cardiology  Oncology  Procedures:  Transfusion of 2U PRBCS and 1U Pheresed platelets 1/18  Transfused 2U PRBCs on 1/19  ECHO 1/18 Study Conclusions  - Left ventricle: The cavity size was normal. Wall thickness was normal. Systolic function was mildly to moderately reduced.  The estimated ejection fraction was in the range of 40% to 45%. Features are consistent with a pseudonormal left ventricular filling pattern, with concomitant abnormal relaxation and increased filling pressure (grade 2 diastolic dysfunction). Doppler parameters are consistent with high ventricular filling pressure. - Regional wall motion abnormality: Hypokinesis of the mid anterior, basal-mid anteroseptal, apical septal, apical lateral, and apical myocardium. - Aortic valve: Mildly calcified annulus. Trileaflet; mildly thickened leaflets. Valve area (VTI): 2.81 cm^2. Valve area (Vmax): 2.81 cm^2. - Mitral valve: There was mild regurgitation. - Left atrium: The atrium was mildly dilated. - Technically difficult study. Recommend limtied study with echocontrast to better clarify LV function and wall motion.  ECHO Limited 1/19 Study Conclusions  - Left ventricle: Systolic function was mildly to moderately reduced. The estimated ejection fraction was in the range of 40% to 45%. - Regional wall motion abnormality: Hypokinesis of the mid-apical anterior, basal-mid anteroseptal, mid anterolateral, apical septal, apical lateral, and apical myocardium. - Limited study with echocontrast to evaluate LV function and wall motion.  Antibiotics:    HPI/Subjective: Feels good. Denies any CP or SOB. Left hand swelling is improved. States he has had a rash on various parts of his body since starting chemotherapy.   Objective: Filed Vitals:   06/11/15 0200 06/11/15 0300 06/11/15 0400 06/11/15 0500  BP:   115/78   Pulse:   85   Temp:   97 F (36.1 C)   TempSrc:   Oral   Resp: 15 15 15    Height:      Weight:    125.9 kg (277 lb 9 oz)  SpO2:   96%     Intake/Output Summary (Last 24 hours) at 06/11/15 [id number] Last data filed at 06/11/15 [id number]  Gross  per 24 hour  Intake   1243 ml  Output   3501 ml  Net  -2258 ml     Filed Weights   06/09/15 0500  06/10/15 0500 06/11/15 0500  Weight: 126.6 kg (279 lb 1.6 oz) 125.3 kg (276 lb 3.8 oz) 125.9 kg (277 lb 9 oz)    Exam:     VSS, afebrile, not hypoxic General:  Appears comfortable, calm. Cardiovascular: Regular rate and rhythm, no murmur, rub or gallop. Trace lower extremity edema. Telemetry: Sinus rhythm, no arrhythmias  Respiratory: Clear to auscultation bilaterally, no wheezes, rales or rhonchi. Normal respiratory effort. Abdomen: soft, ntnd Skin: no rash or induration. Some abrasions on left hand. Less edema on left hand, non-tender. Strong radial pulse, hand is well perfused.  Musculoskeletal: grossly normal tone bilateral upper and lower extremities Psychiatric: grossly normal mood and affect, speech fluent and appropriate   New data reviewed:  WBC 2.8, Hgb 9.5, Platelets 41  Pertinent data since admission:  BNP 657  Troponin 5.11  Platelets 33    Scheduled Meds: . sodium chloride   Intravenous Once  . acetaminophen  650 mg Oral Once  . atorvastatin  80 mg Oral q1800  . carvedilol  6.25 mg Oral BID WC  . diphenhydrAMINE  25 mg Oral Once  . feeding supplement (ENSURE ENLIVE)  237 mL Oral BID BM  . nitroGLYCERIN  0.5 inch Topical 4 times per day  . sodium chloride  3 mL Intravenous Q12H   Continuous Infusions:    Principal Problem:   NSTEMI (non-ST elevated myocardial infarction) ([place]) Active Problems:   Metastatic urothelial carcinoma ([facility])   Dyspnea   Elevated troponin   Atrial fibrillation with rapid ventricular response ([place])   Antineoplastic chemotherapy induced pancytopenia ([place])   By signing my name below, I, Rosalie Doctor attest that this documentation has been prepared under the direction and in the presence of Murray Hodgkins, MD Electronically signed: Rosalie Doctor, Scribe.  06/11/2015  8:23am  I personally performed the services described in this documentation. All medical record entries made by the scribe were at my direction. I have  reviewed the chart and agree that the record reflects my personal performance and is accurate and complete. Murray Hodgkins, MD

## 2015-06-16 ENCOUNTER — Inpatient Hospital Stay (HOSPITAL_COMMUNITY): Payer: Medicare HMO

## 2015-06-16 ENCOUNTER — Ambulatory Visit (HOSPITAL_COMMUNITY): Payer: Medicare HMO | Admitting: Hematology & Oncology

## 2015-06-23 ENCOUNTER — Inpatient Hospital Stay (HOSPITAL_COMMUNITY): Payer: Medicare HMO

## 2015-06-24 ENCOUNTER — Ambulatory Visit (HOSPITAL_COMMUNITY)
Admit: 2015-06-24 | Discharge: 2015-06-24 | Disposition: A | Discharge status: Home / Self Care | Payer: Medicare HMO | Source: Ambulatory Visit | Attending: Cardiology | Admitting: Cardiology

## 2015-06-24 ENCOUNTER — Ambulatory Visit (HOSPITAL_BASED_OUTPATIENT_CLINIC_OR_DEPARTMENT_OTHER)
Admission: RE | Admit: 2015-06-24 | Discharge: 2015-06-24 | Disposition: A | Discharge status: Home / Self Care | Payer: Medicare HMO | Source: Ambulatory Visit | Attending: Cardiology | Admitting: Cardiology

## 2015-06-24 ENCOUNTER — Encounter (HOSPITAL_COMMUNITY): Payer: Self-pay | Admitting: *Deleted

## 2015-06-24 DIAGNOSIS — I4891 Unspecified atrial fibrillation: Secondary | ICD-10-CM

## 2015-06-24 DIAGNOSIS — I081 Rheumatic disorders of both mitral and tricuspid valves: Secondary | ICD-10-CM | POA: Diagnosis not present

## 2015-06-24 DIAGNOSIS — C7911 Secondary malignant neoplasm of bladder: Secondary | ICD-10-CM | POA: Insufficient documentation

## 2015-06-24 DIAGNOSIS — I214 Non-ST elevation (NSTEMI) myocardial infarction: Secondary | ICD-10-CM | POA: Insufficient documentation

## 2015-06-24 DIAGNOSIS — I371 Nonrheumatic pulmonary valve insufficiency: Secondary | ICD-10-CM | POA: Insufficient documentation

## 2015-06-24 MED ORDER — PERFLUTREN LIPID MICROSPHERE: 1.0000 mL | INTRAVENOUS | Status: DC | PRN

## 2015-06-24 MED ORDER — PERFLUTREN LIPID MICROSPHERE
5.0000 mL | INTRAVENOUS | Status: AC | PRN
  Administered 2015-06-24: 5 mL via INTRAVENOUS
  Filled 2015-06-24: qty 10

## 2015-06-24 MED ORDER — PERFLUTREN LIPID MICROSPHERE: 5.0000 mL | INTRAVENOUS | Status: DC | PRN

## 2015-06-24 NOTE — OR Nursing (Signed)
Port accessed in echo lab prior to receiving 5cc definity per doctor order. Port deacessed. Flushed with 5cc sterile saline.   Patient condition stable

## 2015-06-25 ENCOUNTER — Encounter: Payer: Self-pay | Admitting: Adult Health

## 2015-06-25 ENCOUNTER — Ambulatory Visit (INDEPENDENT_AMBULATORY_CARE_PROVIDER_SITE_OTHER): Payer: Medicare HMO | Admitting: Adult Health

## 2015-06-25 VITALS — BP 128/78 | HR 94 | Ht 76.0 in | Wt 282.0 lb

## 2015-06-25 DIAGNOSIS — I480 Paroxysmal atrial fibrillation: Secondary | ICD-10-CM

## 2015-06-25 DIAGNOSIS — D611 Drug-induced aplastic anemia: Secondary | ICD-10-CM | POA: Diagnosis not present

## 2015-06-25 DIAGNOSIS — I248 Other forms of acute ischemic heart disease: Secondary | ICD-10-CM

## 2015-06-25 MED ORDER — LISINOPRIL 2.5 MG PO TABS: 2.5000 mg | ORAL_TABLET | Freq: Every day | ORAL | Status: DC

## 2015-06-25 MED ORDER — CARVEDILOL 6.25 MG PO TABS: ORAL_TABLET | ORAL | Status: DC

## 2015-06-25 MED ORDER — ATORVASTATIN CALCIUM 80 MG PO TABS: 80.0000 mg | ORAL_TABLET | Freq: Every day | ORAL | Status: DC

## 2015-06-25 NOTE — Progress Notes (Deleted)
Name: Harry Franklin    DOB: Feb 19, 1941  Age: 75 y.o.  MR#: [id number]       PCP:  [name], MD      Insurance: Payor: [employer] Rossetti MEDICARE / Plan: [employer] / Product Type: *No Product type* /   CC:   No chief complaint on file.   VS Filed Vitals:   06/25/15 1347  BP: 128/78  Pulse: 94  Height: 6\' 4"  (1.93 m)  Weight: 282 lb (127.914 kg)  SpO2: 96%    Weights Current Weight  06/25/15 282 lb (127.914 kg)  06/11/15 277 lb 9 oz (125.9 kg)  06/02/15 272 lb (123.378 kg)    Blood Pressure  BP Readings from Last 3 Encounters:  06/25/15 128/78  06/11/15 120/78  06/02/15 140/72     Admit date:  (Not on file) Last encounter with RMR:  Visit date not found   Allergy Rabeprazole and Vancomycin  Current Outpatient Prescriptions  Medication Sig Dispense Refill  . atorvastatin (LIPITOR) 80 MG tablet Take 1 tablet (80 mg total) by mouth daily at 6 PM. 15 tablet 0  . calcium carbonate (TUMS EX) 750 MG chewable tablet Chew 2 tablets by mouth 2 (two) times daily as needed for heartburn.    . carvedilol (COREG) 6.25 MG tablet Take 1 tablet (6.25 mg total) by mouth 2 (two) times daily with a meal. 30 tablet 0  . hydrocortisone cream 1 % Apply 1 application topically 2 (two) times daily as needed for itching.    Marland Kitchen lisinopril (PRINIVIL,ZESTRIL) 2.5 MG tablet Take 1 tablet (2.5 mg total) by mouth daily. 30 tablet 0  . Multiple Vitamins-Minerals (PRESERVISION AREDS 2 PO) Take 1 tablet by mouth 2 (two) times daily.    Marland Kitchen omeprazole (PRILOSEC) 40 MG capsule Take 40 mg by mouth 2 (two) times daily.     . ondansetron (ZOFRAN) 8 MG tablet Take 1 tablet (8 mg total) by mouth every 8 (eight) hours as needed for nausea or vomiting. 30 tablet 2  . oxyCODONE (OXY IR/ROXICODONE) 5 MG immediate release tablet Take 1-2 tablets (5-10 mg total) by mouth every 4 (four) hours as needed for severe pain. 60 tablet 0   No current facility-administered medications for this visit.    Discontinued  Meds:    Medications Discontinued During This Encounter  Medication Reason  . atorvastatin (LIPITOR) 80 MG tablet Error  . carvedilol (COREG) 6.25 MG tablet Error  . lisinopril (PRINIVIL,ZESTRIL) 2.5 MG tablet Error  . Calcium Carb-Cholecalciferol 600-500 MG-UNIT CAPS Error    Patient Active Problem List   Diagnosis Date Noted  . NSTEMI (non-ST elevated myocardial infarction) ([place]) 06/09/2015  . Dyspnea 06/08/2015  . Elevated troponin 06/08/2015  . Atrial fibrillation with rapid ventricular response ([place]) 06/08/2015  . Antineoplastic chemotherapy induced pancytopenia ([place]) 06/08/2015  . Metastatic urothelial carcinoma ([place]) 04/12/2015  . Abdominal pain 04/09/2015  . Peptic stricture of esophagus   . Hiatal hernia   . Dysphagia, pharyngoesophageal phase   . Reflux esophagitis   . Dysphagia 07/30/2014  . Encounter for screening colonoscopy 07/30/2014    LABS    Component Value Date/Time   NA 138 06/09/2015 0440   NA 140 06/08/2015 0830   NA 140 06/02/2015 0629   K 3.6 06/09/2015 0440   K 3.9 06/08/2015 0830   K 4.1 06/02/2015 0629   CL 105 06/09/2015 0440   CL 106 06/08/2015 0830   CL 107 06/02/2015 0629   CO2 26 06/09/2015 0440  CO2 26 06/08/2015 0830   CO2 27 06/02/2015 0629   GLUCOSE 105* 06/09/2015 0440   GLUCOSE 120* 06/08/2015 0830   GLUCOSE 126* 06/02/2015 0629   BUN 24* 06/09/2015 0440   BUN 29* 06/08/2015 0830   BUN 23* 06/02/2015 0629   CREATININE 1.08 06/09/2015 0440   CREATININE 1.14 06/08/2015 0830   CREATININE 1.10 06/02/2015 0629   CALCIUM 7.9* 06/09/2015 0440   CALCIUM 8.2* 06/08/2015 0830   CALCIUM 8.7* 06/02/2015 0629   GFRNONAA >60 06/09/2015 0440   GFRNONAA >60 06/08/2015 0830   GFRNONAA >60 06/02/2015 0629   GFRAA >60 06/09/2015 0440   GFRAA >60 06/08/2015 0830   GFRAA >60 06/02/2015 0629   CMP     Component Value Date/Time   NA 138 06/09/2015 0440   K 3.6 06/09/2015 0440   CL 105 06/09/2015 0440   CO2 26 06/09/2015 0440    GLUCOSE 105* 06/09/2015 0440   BUN 24* 06/09/2015 0440   CREATININE 1.08 06/09/2015 0440   CALCIUM 7.9* 06/09/2015 0440   PROT 5.1* 06/09/2015 0440   ALBUMIN 2.5* 06/09/2015 0440   AST 49* 06/09/2015 0440   ALT 42 06/09/2015 0440   ALKPHOS 95 06/09/2015 0440   BILITOT 1.1 06/09/2015 0440   GFRNONAA >60 06/09/2015 0440   GFRAA >60 06/09/2015 0440       Component Value Date/Time   WBC 2.8* 06/11/2015 0445   WBC 3.3* 06/10/2015 0434   WBC 2.6* 06/09/2015 0440   HGB 9.5* 06/11/2015 0445   HGB 8.1* 06/10/2015 0434   HGB 7.3* 06/09/2015 0440   HCT 27.5* 06/11/2015 0445   HCT 23.7* 06/10/2015 0434   HCT 21.3* 06/09/2015 0440   MCV 91.4 06/11/2015 0445   MCV 91.9 06/10/2015 0434   MCV 93.4 06/09/2015 0440    Lipid Panel  No results found for: CHOL, TRIG, HDL, CHOLHDL, VLDL, LDLCALC, LDLDIRECT  ABG No results found for: PHART, PCO2ART, PO2ART, HCO3, TCO2, ACIDBASEDEF, O2SAT   Lab Results  Component Value Date   TSH 1.723 06/08/2015   BNP (last 3 results)  Recent Labs  06/08/15 0830  BNP 657.0*    ProBNP (last 3 results) No results for input(s): PROBNP in the last 8760 hours.  Cardiac Panel (last 3 results) No results for input(s): CKTOTAL, CKMB, TROPONINI, RELINDX in the last 72 hours.  Iron/TIBC/Ferritin/ %Sat    Component Value Date/Time   IRON TEST NOT PERFORMED 11/23/2012 0928   TIBC TEST NOT PERFORMED 11/23/2012 0928   FERRITIN TEST NOT PERFORMED 11/23/2012 0928   IRONPCTSAT TEST NOT PERFORMED 11/23/2012 0928     EKG Orders placed or performed during the hospital encounter of 06/08/15  . EKG 12-Lead  . EKG 12-Lead  . EKG 12-Lead  . EKG 12-Lead  . ED EKG  . ED EKG  . EKG  . EKG 12-Lead  . EKG 12-Lead     Prior Assessment and Plan Problem List as of 06/25/2015      Cardiovascular and Mediastinum   Atrial fibrillation with rapid ventricular response [facility])   NSTEMI (non-ST elevated myocardial infarction) [facility])     Respiratory   Hiatal hernia      Digestive   Dysphagia   Last Assessment & Plan 07/30/2014 Office Visit Written 07/30/2014  2:41 PM by Carlis Stable, NP    Patient with occasional worsenin solid food dysphagia, denies pill dysphagia. Had esophageal dilation sometime after 2006 but doesn't remember exact year or where. History of GERD well controlled on current regimen.  No red flag/warning signs/symptoms. Wishes to proceed with repeat EGD and possible dilation while undergoing colonoscopy. No blood thinners other than saily ASA.   Proceed with TCS and EGD +/- dilation with Dr. Gala Romney in near future: the risks, benefits, and alternatives have been discussed with the patient in detail. The patient states understanding and desires to proceed.      Dysphagia, pharyngoesophageal phase   Reflux esophagitis   Last Assessment & Plan 01/26/2015 Office Visit Written 01/29/2015  8:57 AM by Carlis Stable, NP    Asian status post recent endoscopy on 10/02/2014 with [facility] dilation due to GERD and recurrent esophageal dysphagia. 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 Recommended stop omeprazole and start Dexilant. However the patient is intolerant to many PPIs. He stopped taking the Dexilant because he was having adverse effects and is currently on Zantac 150 milligrams twice a day. He did tolerate Prilosec however and felt his symptoms were better controlled on diet and then the Zantac.  Today we will have him continue his Zantac and restart his omeprazole 20 mg twice a day for better symptomatic control due to his intolerance of Dexilant. We'll have him repeat endoscopy for surveillance as previously recommended and return for follow-up in 3 months.  Proceed with EGD with Dr. Gala Romney in near future: the risks, benefits, and alternatives have been discussed with the patient in detail. The patient states understanding and desires to proceed.  The patient is not currently on any anticoagulants, chronic pain  medications, anxiolytics, or antidepressants. Previous procedure completed under conscious sedation without noted conversation. Conscious sedation likely adequate for this procedure as well.      Peptic stricture of esophagus     Genitourinary   Metastatic urothelial carcinoma [facility])   Last Assessment & Plan 06/02/2015 Office Visit Edited 06/02/2015  9:48 AM by Baird Cancer, PA-C    Metastatic, high grade urothelial cancer undergoing active treatment with Carboplatin/Gemcitabine on 04/14/2015.  Oncology history is up-to-date.  Today is Day 8 of Cycle #3.    He was seen last week and noted chest pain.  EKG was performed and reviewed.  No acute changes noted.  This was compared to his 2014 EKG performed at [facility] by his primary care provider.  As a result, Onpro is planned to be held today to see if this is contributing to this pain.  Neulasta is deleted from the treatment plan.  Delton See is ordered and can be started once insurance approves this medication for reduction of SRE.  Delton See was recently approved thorugh his insurance.  Therefore, we will start the medication today.  I reviewed again, the risks, benefits, alternatives, and side effects of Denosumab including hypocalcemia and ONJ.  He knows to let his dentist know he is on Denosumab prior to any dental procedure.  He is to let us know prior to any elective oral surgery so we can hold the medication appropriately.  He is to consume Ca++ 1200 mg and Vit D 1000 units daily to prevent hypocalcemia.  He is already on appropriate dosing of Ca++ and Vit D and he confirms compliance.  He notes resolution of his chest discomfort when he increased his PPI to twice daily.  He was fearful of the possible risks associated with that increased dose.  He called the clinic the other day reporting intermittent LUQ abdominal pain.  He reports "it is there one day and gone the next" without any intervention.  I have encouraged him to use  his pain medication for  pain control as needed.  He is to let us know if pain is present, progressive, and persistent.    Despite his response to therapy with CT imaging in Jan 2017, he is educated that his stage of disease does not change.  Return for cycle #4 on 06/16/2015 and follow-up appointment with pre-treatment labs as ordered.        Hematopoietic and Hemostatic   Antineoplastic chemotherapy induced pancytopenia [facility])     Other   Encounter for screening colonoscopy   Last Assessment & Plan 09/28/2014 Office Visit Written 09/28/2014  9:09 AM by Carlis Stable, NP    75 year old male presents for update H&P prior to procedure. No changes from last visit. Essentially asymptomatic from a GI standpoint. Still has his medication and instructions from his previous visit. Had to delay colonoscopy due to scheduling issues. We'll proceed with previously scheduled colonoscopy, which is said to occur this Friday.      Abdominal pain   Dyspnea   Elevated troponin       Imaging: Ct Angio Chest Pe W/cm &/or Wo Cm  06/08/2015  CLINICAL DATA:  Shortness of breath, tachycardia starting 05/16/2015, metastatic urothelial cancer on chemotherapy EXAM: CT ANGIOGRAPHY CHEST WITH CONTRAST TECHNIQUE: Multidetector CT imaging of the chest was performed using the standard protocol during bolus administration of intravenous contrast. Multiplanar CT image reconstructions and MIPs were obtained to evaluate the vascular anatomy. CONTRAST:  110mL OMNIPAQUE IOHEXOL 350 MG/ML SOLN COMPARISON:  05/25/2015 FINDINGS: Images of the thoracic inlet are unremarkable. Emphysematous changes bilateral upper lobe again noted. Stable bilateral apical scarring right greater than left. There is no mediastinal hematoma or adenopathy. Small hiatal hernia. The study is of excellent technical quality. No pulmonary embolus is noted. A right hilar lymph node again noted measures 9 mm short-axis stable from prior exam. There is small bilateral pleural effusion. Mild  atelectasis noted bilateral lower lobe posteriorly. Sagittal images of the spine shows degenerative changes thoracic spine. Diffuse osteopenia. No destructive bony lesions are noted. Minimal perihilar bronchitic changes. No segmental infiltrate or pulmonary edema. No destructive rib lesions are noted. The visualized upper abdomen is stable. No adrenal gland mass is noted. Atherosclerotic calcifications of coronary arteries again noted. Heart size is within normal limits. No pericardial effusion. Right IJ Port-A-Cath in place. Review of the MIP images confirms the above findings. IMPRESSION: 1. No pulmonary embolus is noted. 2. Again noted emphysematous changes bilateral upper lobe. 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 Again noted bilateral apical pleural parenchymal scarring right greater than left. 3. No mediastinal hematoma or adenopathy. 4. Stable borderline enlarged right hilar lymph node. 5. No infiltrate or pulmonary edema. Bilateral small pleural effusion with bilateral lower lobe posterior atelectasis. Electronically Signed   By: Lahoma Crocker M.D.   On: 06/08/2015 10:56   US Venous Img Upper Uni Left  06/10/2015  CLINICAL DATA:  Left upper extremity edema, pain EXAM: LEFT UPPER EXTREMITY VENOUS DOPPLER ULTRASOUND TECHNIQUE: Gray-scale sonography with graded compression, as well as color Doppler and duplex ultrasound were performed to evaluate the upper extremity deep venous system from the level of the subclavian vein and including the jugular, axillary, basilic, radial, ulnar and upper cephalic vein. Spectral Doppler was utilized to evaluate flow at rest and with distal augmentation maneuvers. COMPARISON:  None. FINDINGS: Contralateral Subclavian Vein: Respiratory phasicity is normal and symmetric with the symptomatic side. No evidence of thrombus. Normal compressibility. Internal Jugular Vein: No evidence of thrombus. Normal  compressibility, respiratory phasicity and response to  augmentation. Subclavian Vein: No evidence of thrombus. Normal compressibility, respiratory phasicity and response to augmentation. Axillary Vein: No evidence of thrombus. Normal compressibility, respiratory phasicity and response to augmentation. Cephalic Vein: No evidence of thrombus. Normal compressibility, respiratory phasicity and response to augmentation. Basilic Vein: No evidence of thrombus. Normal compressibility, respiratory phasicity and response to augmentation. Brachial Veins: No evidence of thrombus. Normal compressibility, respiratory phasicity and response to augmentation. Radial Veins: No evidence of thrombus. Normal compressibility, respiratory phasicity and response to augmentation. Ulnar Veins: No evidence of thrombus. Normal compressibility, respiratory phasicity and response to augmentation. Venous Reflux:  None visualized. Other Findings:  Forearm region subcutaneous edema noted. IMPRESSION: No evidence of deep venous thrombosis. Electronically Signed   By: Jerilynn Mages.  Shick M.D.   On: 06/10/2015 12:13

## 2015-06-25 NOTE — Patient Instructions (Signed)
Medication Instructions:  Increase coreg to 11/2 tablets two times daily  Sent in refills for lisinopril & atorvastatin   Labwork: none  Testing/Procedures: none  Follow-Up: Your physician recommends that you schedule a follow-up appointment in: 1 month   Any Other Special Instructions Will Be Listed Below (If Applicable).     If you need a refill on your cardiac medications before your next appointment, please call your pharmacy.

## 2015-06-25 NOTE — Progress Notes (Signed)
Cardiology Office Note   Date:  06/25/2015   ID:  Harry Franklin, Harry Franklin 1940/07/11, MRN [id number]  PCP:  [name], MD  Cardiologist: Cloria Spring, NP   Chief Complaint  Patient presents with  . Atrial Fibrillation      History of Present Illness: KASETON SCITES is a 75 y.o. male who presents for ongoing assessment and management of non-ST elevation MI, atrial fibrillation, and dyspnea.other history includes metastatic urothelial carcinoma, undergoing chemotherapy, chemotherapy induced pancytopenia, and subclinical COPD.we saw this patient during hospitalization on consult.  The patient was placed on a diltiazem drip, and converted to normal sinus rhythm.  She was also noted to be anemic and did receive 4 units of packed red blood cells, and 1 unit of platelets.  Echocardiogram was completed during hospitalization, which revealed systolic function was mildly to moderately reduced with an EF of 40-45%.  He did have grade 2 diastolic dysfunction.  She was found to have hypokinesis of the mid anterior, basal mid anterior septal, apical, septal, apical lateral, and apical myocardium.  He was discharged on carvedilol 6.25 mg twice a day, lisinopril 2.5 mg daily, atorvastatin 80 mg daily, and continued on pre-admission medications, but told to stop taking aspirin.  He comes today with multiple questions.  He is very hard of hearing and I asked repeat answers many times.  He has not had any recurrence of rapid heart rhythm.  He is due to followup with oncology for decision on continuing chemotherapy, on Monday June 28, 2015. He continues to feel tired.  He denies any chest pain but does have dyspnea on exertion, New York Heart Association class 2-3.    Past Medical History  Diagnosis Date  . GERD (gastroesophageal reflux disease)   . Hypokalemia   . Hypertension   . Cataract   . Arthritis   . Macular degeneration, age related   . DVT (deep venous thrombosis) ([place])    developed after traveling x 1, developed another after left foot surgery  . Hydrocele in adult   . Metastatic urothelial carcinoma ([place]) 04/12/2015    Past Surgical History  Procedure Laterality Date  . Replacement total knee    . Appendectomy    . Hammer toe surgery Left   . Lapidus procedure    . Hallux fusion    . Colonoscopy  03/31/05  . Colonoscopy N/A 10/02/2014    [id number] diverticulosis  . Esophagogastroduodenoscopy N/A 10/02/2014    [id number] ulcerative reflux/s/p dilation/large HH  . [place] dilation N/A 10/02/2014    Procedure: Venia Minks DILATION;  Surgeon: Daneil Dolin, MD;  Location: [facility];  Service: Endoscopy;  Laterality: N/A;  . Esophagogastroduodenoscopy N/A 01/29/2015    Procedure: ESOPHAGOGASTRODUODENOSCOPY (EGD);  Surgeon: Daneil Dolin, MD;  Location: [facility] ENDO SU[facility];  Service: Endoscopy;  Laterality: N/A;  0800-rescheduled to 9/9 @ 915 [place] notified pt  . Esophageal dilation  01/29/2015    Procedure: ESOPHAGEAL DILATION;  Surgeon: Daneil Dolin, MD;  Location: [facility];  Service: Endoscopy;;  . Portacath placement Left 04/13/15     Current Outpatient Prescriptions  Medication Sig Dispense Refill  . atorvastatin (LIPITOR) 80 MG tablet Take 1 tablet (80 mg total) by mouth daily at 6 PM. 90 tablet 3  . calcium carbonate (TUMS EX) 750 MG chewable tablet Chew 2 tablets by mouth 2 (two) times daily as needed for heartburn.    . carvedilol (COREG) 6.25 MG tablet Take 1 & 1/2 tablets two times  daily 135 tablet 3  . hydrocortisone cream 1 % Apply 1 application topically 2 (two) times daily as needed for itching.    Marland Kitchen lisinopril (PRINIVIL,ZESTRIL) 2.5 MG tablet Take 1 tablet (2.5 mg total) by mouth daily. 90 tablet 3  . Multiple Vitamins-Minerals (PRESERVISION AREDS 2 PO) Take 1 tablet by mouth 2 (two) times daily.    Marland Kitchen omeprazole (PRILOSEC) 40 MG capsule Take 40 mg by mouth 2 (two) times daily.     . ondansetron (ZOFRAN) 8 MG tablet Take 1 tablet (8 mg  total) by mouth every 8 (eight) hours as needed for nausea or vomiting. 30 tablet 2  . oxyCODONE (OXY IR/ROXICODONE) 5 MG immediate release tablet Take 1-2 tablets (5-10 mg total) by mouth every 4 (four) hours as needed for severe pain. 60 tablet 0   No current facility-administered medications for this visit.    Allergies:   Rabeprazole and Vancomycin    Social History:  The patient  reports that he quit smoking about 31 years ago. His smoking use included Cigarettes. He has a 31 pack-year smoking history. He has never used smokeless tobacco. He reports that he does not drink alcohol or use illicit drugs.   Family History:  The patient's family history includes Colon cancer in his maternal uncle; Heart disease in his father.    ROS: All other systems are reviewed and negative. Unless otherwise mentioned in H&P    PHYSICAL EXAM: VS:  BP 128/78 mmHg  Pulse 94  Ht 6\' 4"  (1.93 m)  Wt 282 lb (127.914 kg)  BMI 34.34 kg/m2  SpO2 96% , BMI Body mass index is 34.34 kg/(m^2). GEN: Well nourished, well developed, in no acute distresspale HEENT: normal Neck: no JVD, carotid bruits, or masses Cardiac: RRR; no murmurs, rubs, or gallops,no edema  Respiratory:  clear to auscultation bilaterally, normal work of breathing GI: soft, nontender, nondistended, + BS MS: no deformity or atrophy Skin: warm and dry, no rash Neuro:  Strength and sensation are intact Psych: euthymic mood, full affect  Recent Labs: 06/08/2015: B Natriuretic Peptide 657.0*; TSH 1.723 06/09/2015: ALT 42; BUN 24*; Creatinine, Ser 1.08; Potassium 3.6; Sodium 138 06/11/2015: Hemoglobin 9.5*; Platelets 41*    Lipid Panel No results found for: CHOL, TRIG, HDL, CHOLHDL, VLDL, LDLCALC, LDLDIRECT    Wt Readings from Last 3 Encounters:  06/25/15 282 lb (127.914 kg)  06/11/15 277 lb 9 oz (125.9 kg)  06/02/15 272 lb (123.378 kg)      ASSESSMENT AND PLAN:   1.  Paroxysmal atrial fibrillation: he is currently in normal sinus  rhythm.  This was likely related to significant anemia in the setting of chemotherapy with thrombocytopenia.  He was not placed on anticoagulation therapy.  Heart rate remains elevated at 94 beats per minute at rest.  We will increase his carvedilol to 9.375 twice a day. We will see him again in a month for ongoing assessment of heart rate.  I have warned him that his blood pressure may drop slightly with the increased dose.  2. 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  He is being followed by oncology, who is yet to restart chemotherapy.  He will see them in a few days for lab work and discussion of need to continue therapy.   Current medicines are reviewed at length with the patient today.    Labs/ tests ordered today include:  No orders of the defined types were placed in  this encounter.     Disposition:   FU with one month   Signed, Jory Sims, NP  06/25/2015 2:57 PM    [facility] [address], [place], [place] [address] Phone: [phone]; Fax: [phone]

## 2015-06-28 ENCOUNTER — Other Ambulatory Visit (HOSPITAL_COMMUNITY): Payer: Self-pay | Admitting: Oncology

## 2015-06-28 ENCOUNTER — Telehealth: Payer: Self-pay | Admitting: Cardiology

## 2015-06-28 ENCOUNTER — Telehealth: Payer: Self-pay

## 2015-06-28 ENCOUNTER — Encounter (HOSPITAL_COMMUNITY): Payer: Medicare HMO | Attending: Hematology & Oncology | Admitting: Hematology & Oncology

## 2015-06-28 ENCOUNTER — Other Ambulatory Visit: Payer: Self-pay

## 2015-06-28 ENCOUNTER — Encounter (HOSPITAL_COMMUNITY): Payer: Self-pay | Admitting: Hematology & Oncology

## 2015-06-28 ENCOUNTER — Encounter (HOSPITAL_BASED_OUTPATIENT_CLINIC_OR_DEPARTMENT_OTHER): Payer: Medicare HMO

## 2015-06-28 VITALS — BP 131/57 | HR 86 | Temp 98.2°F | Resp 20 | Wt 278.6 lb

## 2015-06-28 VITALS — BP 115/57 | HR 80 | Temp 98.4°F | Resp 15

## 2015-06-28 DIAGNOSIS — R079 Chest pain, unspecified: Secondary | ICD-10-CM

## 2015-06-28 DIAGNOSIS — I4891 Unspecified atrial fibrillation: Secondary | ICD-10-CM

## 2015-06-28 DIAGNOSIS — Z5111 Encounter for antineoplastic chemotherapy: Secondary | ICD-10-CM | POA: Diagnosis not present

## 2015-06-28 DIAGNOSIS — C7971 Secondary malignant neoplasm of right adrenal gland: Secondary | ICD-10-CM

## 2015-06-28 DIAGNOSIS — C649 Malignant neoplasm of unspecified kidney, except renal pelvis: Secondary | ICD-10-CM

## 2015-06-28 DIAGNOSIS — C7910 Secondary malignant neoplasm of unspecified urinary organs: Secondary | ICD-10-CM | POA: Insufficient documentation

## 2015-06-28 DIAGNOSIS — C787 Secondary malignant neoplasm of liver and intrahepatic bile duct: Secondary | ICD-10-CM

## 2015-06-28 DIAGNOSIS — C7972 Secondary malignant neoplasm of left adrenal gland: Secondary | ICD-10-CM

## 2015-06-28 DIAGNOSIS — D6481 Anemia due to antineoplastic chemotherapy: Secondary | ICD-10-CM

## 2015-06-28 DIAGNOSIS — D61818 Other pancytopenia: Secondary | ICD-10-CM

## 2015-06-28 DIAGNOSIS — I214 Non-ST elevation (NSTEMI) myocardial infarction: Secondary | ICD-10-CM

## 2015-06-28 DIAGNOSIS — K219 Gastro-esophageal reflux disease without esophagitis: Secondary | ICD-10-CM

## 2015-06-28 LAB — COMPREHENSIVE METABOLIC PANEL
ALK PHOS: 108 U/L (ref 38–126)
ALT: 19 U/L (ref 17–63)
AST: 28 U/L (ref 15–41)
Albumin: 2.8 g/dL — ABNORMAL LOW (ref 3.5–5.0)
Anion gap: 6 (ref 5–15)
BILIRUBIN TOTAL: 0.6 mg/dL (ref 0.3–1.2)
BUN: 15 mg/dL (ref 6–20)
CHLORIDE: 107 mmol/L (ref 101–111)
CO2: 26 mmol/L (ref 22–32)
CREATININE: 1.1 mg/dL (ref 0.61–1.24)
Calcium: 7.9 mg/dL — ABNORMAL LOW (ref 8.9–10.3)
GFR calc Af Amer: >60 mL/min (ref >60)
GFR calc non Af Amer: >60 mL/min (ref >60)
GLUCOSE: 133 mg/dL — AB (ref 65–99)
POTASSIUM: 3.8 mmol/L (ref 3.5–5.1)
Sodium: 139 mmol/L (ref 135–145)
Total Protein: 6.4 g/dL — ABNORMAL LOW (ref 6.5–8.1)

## 2015-06-28 LAB — CBC WITH DIFFERENTIAL/PLATELET
Basophils Absolute: 0 10*3/uL (ref 0.0–0.1)
Basophils Relative: 0 %
Eosinophils Absolute: 0.4 10*3/uL (ref 0.0–0.7)
Eosinophils Relative: 7 %
HEMATOCRIT: 33.9 % — AB (ref 39.0–52.0)
HEMOGLOBIN: 10.8 g/dL — AB (ref 13.0–17.0)
LYMPHS ABS: 1 10*3/uL (ref 0.7–4.0)
Lymphocytes Relative: 17 %
MCH: 30.8 pg (ref 26.0–34.0)
MCHC: 31.9 g/dL (ref 30.0–36.0)
MCV: 96.6 fL (ref 78.0–100.0)
MONO ABS: 0.9 10*3/uL (ref 0.1–1.0)
MONOS PCT: 16 %
NEUTROS ABS: 3.4 10*3/uL (ref 1.7–7.7)
NEUTROS PCT: 60 %
Platelets: 281 10*3/uL (ref 150–400)
RBC: 3.51 MIL/uL — ABNORMAL LOW (ref 4.22–5.81)
RDW: 18.5 % — AB (ref 11.5–15.5)
WBC: 5.6 10*3/uL (ref 4.0–10.5)

## 2015-06-28 MED ORDER — SODIUM CHLORIDE 0.9 % IV SOLN
10.0000 mg | Freq: Once | INTRAVENOUS | Status: AC
  Administered 2015-06-28: 10 mg via INTRAVENOUS
  Filled 2015-06-28: qty 1

## 2015-06-28 MED ORDER — PALONOSETRON HCL INJECTION 0.25 MG/5ML
0.2500 mg | Freq: Once | INTRAVENOUS | Status: AC
  Administered 2015-06-28: 0.25 mg via INTRAVENOUS

## 2015-06-28 MED ORDER — SODIUM CHLORIDE 0.9 % IV SOLN
Freq: Once | INTRAVENOUS | Status: AC
  Administered 2015-06-28: 10:00:00 via INTRAVENOUS

## 2015-06-28 MED ORDER — SODIUM CHLORIDE 0.9 % IV SOLN
600.0000 mg/m2 | Freq: Once | INTRAVENOUS | Status: AC
  Administered 2015-06-28: 1520 mg via INTRAVENOUS
  Filled 2015-06-28: qty 39.98

## 2015-06-28 MED ORDER — HEPARIN SOD (PORK) LOCK FLUSH 100 UNIT/ML IV SOLN
INTRAVENOUS | Status: AC
  Filled 2015-06-28: qty 5

## 2015-06-28 MED ORDER — SODIUM CHLORIDE 0.9 % IJ SOLN
10.0000 mL | INTRAMUSCULAR | Status: DC | PRN
  Administered 2015-06-28: 10 mL
  Filled 2015-06-28: qty 10

## 2015-06-28 MED ORDER — SODIUM CHLORIDE 0.9 % IV SOLN
517.6000 mg | Freq: Once | INTRAVENOUS | Status: AC
  Administered 2015-06-28: 520 mg via INTRAVENOUS
  Filled 2015-06-28: qty 52

## 2015-06-28 MED ORDER — HEPARIN SOD (PORK) LOCK FLUSH 100 UNIT/ML IV SOLN
500.0000 [IU] | Freq: Once | INTRAVENOUS | Status: AC | PRN
Start: 2015-06-28 — End: 2015-06-28
  Administered 2015-06-28: 500 [IU]

## 2015-06-28 MED ORDER — PALONOSETRON HCL INJECTION 0.25 MG/5ML
INTRAVENOUS | Status: AC
  Filled 2015-06-28: qty 5

## 2015-06-28 NOTE — Telephone Encounter (Signed)
-----   Message from Arnoldo Lenis, MD sent at 06/28/2015 10:32 AM EST ----- Echo shows heart function remains stable, it remains in the mildly decreased range. I'd like to see him this Friday in clinic, can add to my 940AM if needed.   Zandra Abts MD

## 2015-06-28 NOTE — Progress Notes (Signed)
Harry Inches, MD [address] [place] [address]  Metastatic urothelial carcinoma [facility]) - Plan: CBC with Differential, Comprehensive metabolic panel  CURRENT THERAPY: Carboplatin/Gemcitabine in a day 1, 8 every 21 day fashion  INTERVAL HISTORY: LAKAI SEJA 75 y.o. 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    Metastatic urothelial carcinoma ([place])   04/02/2015 Imaging CT abd/pelvis- Innumberable hepatic masses, B/L adrenal mets, abdominopelvic lymphadenopathy, LLL lung lesion 6 mm, Lytic lesion of right L2 vertebral bosy, circumferential wall thickening of lower thoracic esophagus, mild diffuse bladder wall thickening.   04/02/2015 Pathology Results Urine Cytology- cells present suspicious for malignancy.  Suspicious for high-grade urothelial carcinoma.   04/08/2015 Procedure US guided biopsy of hepatic lesion   04/08/2015 Pathology Results Liver, needle/core biopsy, right - METASTATIC HIGH GRADE CARCINOMA,   04/14/2015 -  Chemotherapy Carboplatin/Gemcitabine Days 1, 8 every 21 days   05/25/2015 Imaging CT with interval response to therapy, improvement in multifocal liver mets, improvement in enlarged abdominal and pelvic adenopahty and bilateral adrenal gland metastatese, stable appearance of lytic lesion in L2   06/02/2015 Miscellaneous Xgeva to reduce the risk of SLE.  120 mg SQ every month.   06/08/2015 - 06/11/2015 Hospital Admission NSTEMI, Afib with RVR, Hypertension, Chemotherapy induced pancyopenia    Mr. Beyerle returns to the [place] today accompanied by his daughter and several other family members.   The patient went to the ER on 06/08/15 when he was experiencing arm and chest pain. He was unable to breath while laying down.  It was discovered that he had experienced a minor heart attack. His family member sounded very  concerned in reporting that he drove to [place] while feeling this way.  The patient states that he has been eating well but feels as though he has lost weight. He has taken one anti nausea pill since our last visit. He has recently starting drinking Boost again, admitting that he has not been doing as well as he should with it as he does not like the taste. He takes Vitamin D and calcium supplements.  He has decreased his omeprazole to once daily.  Mr. Bueno admits that he has not been very active. However, he is able to walk to the car without getting out of breath. States that he is unable to walk outside when the weather is under 32 F. He has not been to the grocery store or other places to walk indoors.  His family member reports that he has complained of lower abdominal pain. The patient responds, "Dying isn't fun".  The patient is still getting over a cold and attributes his shortness of breath to this. He has also been congested. He has had a recent follow-up with cardiology. He is here today to discuss ongoing chemotherapy for his stage IV urothelial carcinoma.  Past Medical History  Diagnosis Date  . GERD (gastroesophageal reflux disease)   . Hypokalemia   . Hypertension   . Cataract   . Arthritis   . Macular degeneration, age related   . DVT (deep venous thrombosis) ([place])     developed after traveling x 1, developed another after left foot surgery  . Hydrocele in adult   . Metastatic urothelial carcinoma ([place]) 04/12/2015    has Dysphagia; Encounter for screening colonoscopy; Dysphagia, pharyngoesophageal phase; Reflux esophagitis; Peptic stricture of esophagus; Hiatal hernia; Abdominal pain; Metastatic  urothelial carcinoma ([place]); Dyspnea; Elevated troponin; Atrial fibrillation with rapid ventricular response ([place]); Antineoplastic chemotherapy induced pancytopenia ([place]); and NSTEMI (non-ST elevated myocardial infarction) ([place]) on his problem list.     is allergic to  rabeprazole and vancomycin.  Current Outpatient Prescriptions on File Prior to Visit  Medication Sig Dispense Refill  . atorvastatin (LIPITOR) 80 MG tablet Take 1 tablet (80 mg total) by mouth daily at 6 PM. 90 tablet 3  . calcium carbonate (TUMS EX) 750 MG chewable tablet Chew 2 tablets by mouth daily.     . carvedilol (COREG) 6.25 MG tablet Take 1 & 1/2 tablets two times daily 135 tablet 3  . lisinopril (PRINIVIL,ZESTRIL) 2.5 MG tablet Take 1 tablet (2.5 mg total) by mouth daily. 90 tablet 3  . Multiple Vitamins-Minerals (PRESERVISION AREDS 2 PO) Take 1 tablet by mouth 2 (two) times daily.    Marland Kitchen omeprazole (PRILOSEC) 40 MG capsule Take 40 mg by mouth daily.     . hydrocortisone cream 1 % Apply 1 application topically 2 (two) times daily as needed for itching. Reported on 06/28/2015    . ondansetron (ZOFRAN) 8 MG tablet Take 1 tablet (8 mg total) by mouth every 8 (eight) hours as needed for nausea or vomiting. (Patient not taking: Reported on 06/28/2015) 30 tablet 2  . oxyCODONE (OXY IR/ROXICODONE) 5 MG immediate release tablet Take 1-2 tablets (5-10 mg total) by mouth every 4 (four) hours as needed for severe pain. (Patient not taking: Reported on 06/28/2015) 60 tablet 0   No current facility-administered medications on file prior to visit.    Past Surgical History  Procedure Laterality Date  . Replacement total knee    . Appendectomy    . Hammer toe surgery Left   . Lapidus procedure    . Hallux fusion    . Colonoscopy  03/31/05  . Colonoscopy N/A 10/02/2014    [id number] diverticulosis  . Esophagogastroduodenoscopy N/A 10/02/2014    [id number] ulcerative reflux/s/p dilation/large HH  . [place] dilation N/A 10/02/2014    Procedure: Venia Minks DILATION;  Surgeon: Daneil Dolin, MD;  [facility];  Service: Endoscopy;  Laterality: N/A;  . Esophagogastroduodenoscopy N/A 01/29/2015    Procedure: ESOPHAGOGASTRODUODENOSCOPY (EGD);  Surgeon: Daneil Dolin, MD;  Location: [facility];   Service: Endoscopy;  Laterality: N/A;  0800-rescheduled to 9/9 @ 915 [place] notified pt  . Esophageal dilation  01/29/2015    Procedure: ESOPHAGEAL DILATION;  Surgeon: Daneil Dolin, MD;  Location: [facility];  Service: Endoscopy;;  . Portacath placement Left 04/13/15    REVIEW OF SYSTEMS  Positive for lower abdominal pain. Positive for shortness of breath and congestion. Positive for fall x 1    One episode of falling after making a sudden movement. Denies any headaches, dizziness, double vision, fevers, chills, night sweats, nausea, vomiting, diarrhea, constipation, chest pain, heart palpitations, shortness of breath, blood in stool, black tarry stool, urinary pain, urinary burning, urinary frequency, hematuria.  14 point review of systems was performed and is negative except as detailed under history of present illness and above   PHYSICAL EXAMINATION  ECOG PERFORMANCE STATUS: 0 - Asymptomatic  Vitals with BMI 06/25/2015  Height 6\' 4"   Weight 282 lbs  BMI [id number]  Systolic 0000000  Diastolic 78  Pulse 94  Respirations     GENERAL:alert, no distress, well nourished, well developed, comfortable, cooperative, smiling and accompanied by family. SKIN: skin color, texture, turgor are normal, no rashes or significant lesions HEAD: Normocephalic,  No masses, lesions, tenderness or abnormalities EYES: normal, EOMI, Conjunctiva are pink and non-injected EARS: External ears normal OROPHARYNX:lips, buccal mucosa, and tongue normal and mucous membranes are moist  NECK: supple, no stridor, trachea midline LYMPH:  no palpable lymphadenopathy, no hepatosplenomegaly BREAST:not examined LUNGS: clear to auscultation and percussion HEART: regular rate & rhythm ABDOMEN:abdomen soft, non-tender, normal bowel sounds and no masses or organomegaly BACK: Back symmetric, no curvature. EXTREMITIES:less then 2 second capillary refill, no edema, no skin discoloration, no clubbing, no cyanosis  NEURO: alert &  oriented x 3 with fluent speech, no focal motor/sensory deficits, gait normal   LABORATORY DATA: I have reviewed the data as listed  CBC    Component Value Date/Time   WBC 5.6 06/28/2015 0828   RBC 3.51* 06/28/2015 0828   HGB 10.8* 06/28/2015 0828   HCT 33.9* 06/28/2015 0828   PLT 281 06/28/2015 0828   MCV 96.6 06/28/2015 0828   MCH 30.8 06/28/2015 0828   MCHC 31.9 06/28/2015 0828   RDW 18.5* 06/28/2015 0828   LYMPHSABS 1.0 06/28/2015 0828   MONOABS 0.9 06/28/2015 0828   EOSABS 0.4 06/28/2015 0828   BASOSABS 0.0 06/28/2015 0828      Chemistry      Component Value Date/Time   NA 139 06/28/2015 0828   K 3.8 06/28/2015 0828   CL 107 06/28/2015 0828   CO2 26 06/28/2015 0828   BUN 15 06/28/2015 0828   CREATININE 1.10 06/28/2015 0828      Component Value Date/Time   CALCIUM 7.9* 06/28/2015 0828   ALKPHOS 108 06/28/2015 0828   AST 28 06/28/2015 0828   ALT 19 06/28/2015 0828   BILITOT 0.6 06/28/2015 0828     PENDING LABS:   RADIOGRAPHIC STUDIES: I have personally reviewed the radiological images as listed and agreed with the findings in the report.  Ct Angio Chest Pe W/cm &/or Wo Cm  06/08/2015  CLINICAL DATA:  Shortness of breath, tachycardia starting 05/16/2015, metastatic urothelial cancer on chemotherapy EXAM: CT ANGIOGRAPHY CHEST WITH CONTRAST TECHNIQUE: Multidetector CT imaging of the chest was performed using the standard protocol during bolus administration of intravenous contrast. Multiplanar CT image reconstructions and MIPs were obtained to evaluate the vascular anatomy. CONTRAST:  14mL OMNIPAQUE IOHEXOL 350 MG/ML SOLN COMPARISON:  05/25/2015 FINDINGS: Images of the thoracic inlet are unremarkable. Emphysematous changes bilateral upper lobe again noted. Stable bilateral apical scarring right greater than left. There is no mediastinal hematoma or adenopathy. Small hiatal hernia. The study is of excellent technical quality. No pulmonary embolus is noted. A right  hilar lymph node again noted measures 9 mm short-axis stable from prior exam. There is small bilateral pleural effusion. Mild atelectasis noted bilateral lower lobe posteriorly. Sagittal images of the spine shows degenerative changes thoracic spine. Diffuse osteopenia. No destructive bony lesions are noted. Minimal perihilar bronchitic changes. No segmental infiltrate or pulmonary edema. No destructive rib lesions are noted. The visualized upper abdomen is stable. No adrenal gland mass is noted. Atherosclerotic calcifications of coronary arteries again noted. Heart size is within normal limits. No pericardial effusion. Right IJ Port-A-Cath in place. Review of the MIP images confirms the above findings. IMPRESSION: 1. No pulmonary embolus is noted. 2. Again noted emphysematous changes bilateral upper lobe. 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 Again noted bilateral apical pleural parenchymal scarring right greater than left. 3. No mediastinal hematoma or adenopathy. 4. Stable borderline enlarged right hilar lymph node. 5. No infiltrate or pulmonary edema. Bilateral small pleural effusion with bilateral  lower lobe posterior atelectasis. Electronically Signed   By: Lahoma Crocker M.D.   On: 06/08/2015 10:56   US Venous Img Upper Uni Left  06/10/2015  CLINICAL DATA:  Left upper extremity edema, pain EXAM: LEFT UPPER EXTREMITY VENOUS DOPPLER ULTRASOUND TECHNIQUE: Gray-scale sonography with graded compression, as well as color Doppler and duplex ultrasound were performed to evaluate the upper extremity deep venous system from the level of the subclavian vein and including the jugular, axillary, basilic, radial, ulnar and upper cephalic vein. Spectral Doppler was utilized to evaluate flow at rest and with distal augmentation maneuvers. COMPARISON:  None. FINDINGS: Contralateral Subclavian Vein: Respiratory phasicity is normal and symmetric with the symptomatic side. No evidence of thrombus. Normal  compressibility. Internal Jugular Vein: No evidence of thrombus. Normal compressibility, respiratory phasicity and response to augmentation. Subclavian Vein: No evidence of thrombus. Normal compressibility, respiratory phasicity and response to augmentation. Axillary Vein: No evidence of thrombus. Normal compressibility, respiratory phasicity and response to augmentation. Cephalic Vein: No evidence of thrombus. Normal compressibility, respiratory phasicity and response to augmentation. Basilic Vein: No evidence of thrombus. Normal compressibility, respiratory phasicity and response to augmentation. Brachial Veins: No evidence of thrombus. Normal compressibility, respiratory phasicity and response to augmentation. Radial Veins: No evidence of thrombus. Normal compressibility, respiratory phasicity and response to augmentation. Ulnar Veins: No evidence of thrombus. Normal compressibility, respiratory phasicity and response to augmentation. Venous Reflux:  None visualized. Other Findings:  Forearm region subcutaneous edema noted. IMPRESSION: No evidence of deep venous thrombosis. Electronically Signed   By: Jerilynn Mages.  Shick M.D.   On: 06/10/2015 12:13    ASSESSMENT AND PLAN: Stage IV urothelial carcinoma Liver Metastases Adrenal Metastases  Chest pain/GERD History of esophageal stricture Carboplatin/Gemzar with excellent tolerance and response Anemia, treatment related NSTEMI.  Atrial fibrillation with RVR CARBO/GEMZAR dose reduction secondary to above and pancytopenia  We discussed multiple issues today including discontinuing therapy. He does not wish to do that.  I have recommended dose reducing his therapy secondary to his pancytopenia; following his counts weekly and considering adding ESA therapy if necessary to maintain a HCT >/= to 30.  He is ok to restart his adult aspirin.  Mr. Cottrill will receive treatment today and his next treatment is scheduled for 07/07/15. I will see him again later this  week to see how he is doing.   I have again encouraged him to notify us with any problems or concerns prior to his follow-up later this week.  All questions were answered. The patient knows to call the clinic with any problems, questions or concerns. We can certainly see the patient much sooner if necessary.  This document serves as a record of services personally performed by Ancil Linsey, MD. It was created on her behalf by Arlyce Harman, a trained medical scribe. The creation of this record is based on the scribe's personal observations and the provider's statements to them. This document has been checked and approved by the attending provider.  I have reviewed the above documentation for accuracy and completeness, and I agree with the above.  This note is electronically signed by: Molli Hazard, MD 06/28/2015 3:03 PM

## 2015-06-28 NOTE — Progress Notes (Signed)
Tolerated chemo well. Ambulatory on discharge home with family. 

## 2015-06-28 NOTE — Patient Instructions (Signed)
[facility] Discharge Instructions for Patients Receiving Chemotherapy  Today you received the following chemotherapy agents Gemzar and Carbo.  To help prevent nausea and vomiting after your treatment, we encourage you to take your nausea medication as instructed. If you develop nausea and vomiting that is not controlled by your nausea medication, call the clinic. If it is after clinic hours your family physician or the after hours number for the clinic or go to the Emergency Department. BELOW ARE SYMPTOMS THAT SHOULD BE REPORTED IMMEDIATELY:  *FEVER GREATER THAN 101.0 F  *CHILLS WITH OR WITHOUT FEVER  NAUSEA AND VOMITING THAT IS NOT CONTROLLED WITH YOUR NAUSEA MEDICATION  *UNUSUAL SHORTNESS OF BREATH  *UNUSUAL BRUISING OR BLEEDING  TENDERNESS IN MOUTH AND THROAT WITH OR WITHOUT PRESENCE OF ULCERS  *URINARY PROBLEMS  *BOWEL PROBLEMS  UNUSUAL RASH Items with * indicate a potential emergency and should be followed up as soon as possible.  Return as scheduled.  I have been informed and understand all the instructions given to me. I know to contact the clinic, my physician, or go to the Emergency Department if any problems should occur. I do not have any questions at this time, but understand that I may call the clinic during office hours or the Patient Navigator at [phone] should I have any questions or need assistance in obtaining follow up care.    __________________________________________  _____________  __________ Signature of Patient or Authorized Representative            Date                   Time    __________________________________________ Nurse's Signature

## 2015-06-28 NOTE — Patient Instructions (Addendum)
[place] at [facility] Discharge Instructions  RECOMMENDATIONS MADE BY THE CONSULTANT AND ANY TEST RESULTS WILL BE SENT TO YOUR REFERRING PHYSICIAN.   Exam and discussion by Dr Whitney Muse today  Growth factors: we make them on our own.  Red blood cell growth factors: aranesp and procrit.  These are two different ones that we can give you that can help your body stimulate to make red blood cells.  Aranesp 500 mcg weeks during treatment to help your body to stimulate it to make red blood cells, we have to get this authorized first.  Blood counts weekly.  You can take plain mucinex or Claritin that may help with your cold.  We can cut the doses down on the chemotherapy.  Return to see the doctor at the end of the week.  Continue taking aspirin 325 mg  Please call the clinic if you have any questions or concerns      Thank you for choosing [place] at [facility] to provide your oncology and hematology care.  To afford each patient quality time with our provider, please arrive at least 15 minutes before your scheduled appointment time.   Beginning January 23rd 2017 lab work for the [name] will be done in the  Main lab at [facility] on 1st floor. If you have a lab appointment with the [facility] please come in thru the  Main Entrance and check in at the main information desk  You need to re-schedule your appointment should you arrive 10 or more minutes late.  We strive to give you quality time with our providers, and arriving late affects you and other patients whose appointments are after yours.  Also, if you no show three or more times for appointments you may be dismissed from the clinic at the providers discretion.     Again, thank you for choosing [facility]  Our hope is that these requests will decrease the amount of time that you wait before being seen by our physicians.        _____________________________________________________________  Should you have questions after your visit to [facility], please contact our office at [phone] between the hours of 8:30 a.m. and 4:30 p.m.  Voicemails left after 4:30 p.m. will not be returned until the following business day.  For prescription refill requests, have your pharmacy contact our office.

## 2015-06-28 NOTE — Telephone Encounter (Signed)
Left message for Harry Franklin to return my phone call regarding his test results. Dr. Harl Bowie want's to see him this Friday to discuss his test in detail. His appointment is 07/02/15 @ 9:40

## 2015-06-28 NOTE — Telephone Encounter (Signed)
Pt stopped by the office to get his lab results

## 2015-06-30 ENCOUNTER — Telehealth (HOSPITAL_COMMUNITY): Payer: Self-pay | Admitting: Oncology

## 2015-06-30 NOTE — Telephone Encounter (Signed)
[facility]HEL[facility] [id number] EXT 1945 P[facility] to [place] to withdraw our request for aranesp. Per Vikki Ports pt did not meet medicare guidelines.  HEMOGLOBIN MUST BE 10 OR BELOW HEMATOCRIT MUST BE 30 OR BELOW  PT MUST ALSO HAVE A FERRTIN OF GREATER THAN 100 OR IRON SATURATION LEVEL GREATER THAN  20%

## 2015-07-02 ENCOUNTER — Encounter: Payer: Self-pay | Admitting: Cardiology

## 2015-07-02 ENCOUNTER — Ambulatory Visit (INDEPENDENT_AMBULATORY_CARE_PROVIDER_SITE_OTHER): Payer: Medicare HMO | Admitting: Cardiology

## 2015-07-02 ENCOUNTER — Encounter (HOSPITAL_BASED_OUTPATIENT_CLINIC_OR_DEPARTMENT_OTHER): Payer: Medicare HMO | Admitting: Oncology

## 2015-07-02 ENCOUNTER — Encounter (HOSPITAL_COMMUNITY): Payer: Medicare HMO

## 2015-07-02 ENCOUNTER — Encounter (HOSPITAL_COMMUNITY): Payer: Self-pay | Admitting: Oncology

## 2015-07-02 VITALS — BP 137/76 | HR 79 | Temp 98.0°F | Resp 20 | Wt 281.0 lb

## 2015-07-02 VITALS — BP 120/78 | HR 76 | Ht 76.0 in | Wt 279.0 lb

## 2015-07-02 DIAGNOSIS — I5022 Chronic systolic (congestive) heart failure: Secondary | ICD-10-CM

## 2015-07-02 DIAGNOSIS — I259 Chronic ischemic heart disease, unspecified: Secondary | ICD-10-CM

## 2015-07-02 DIAGNOSIS — C7971 Secondary malignant neoplasm of right adrenal gland: Secondary | ICD-10-CM

## 2015-07-02 DIAGNOSIS — C649 Malignant neoplasm of unspecified kidney, except renal pelvis: Secondary | ICD-10-CM

## 2015-07-02 DIAGNOSIS — C7910 Secondary malignant neoplasm of unspecified urinary organs: Secondary | ICD-10-CM | POA: Diagnosis not present

## 2015-07-02 DIAGNOSIS — R0602 Shortness of breath: Secondary | ICD-10-CM | POA: Diagnosis not present

## 2015-07-02 DIAGNOSIS — I480 Paroxysmal atrial fibrillation: Secondary | ICD-10-CM

## 2015-07-02 DIAGNOSIS — C787 Secondary malignant neoplasm of liver and intrahepatic bile duct: Secondary | ICD-10-CM

## 2015-07-02 DIAGNOSIS — C7972 Secondary malignant neoplasm of left adrenal gland: Secondary | ICD-10-CM

## 2015-07-02 LAB — CBC WITH DIFFERENTIAL/PLATELET
BASOS ABS: 0 10*3/uL (ref 0.0–0.1)
BASOS PCT: 1 %
EOS PCT: 7 %
Eosinophils Absolute: 0.2 10*3/uL (ref 0.0–0.7)
HCT: 32.9 % — ABNORMAL LOW (ref 39.0–52.0)
Hemoglobin: 10.6 g/dL — ABNORMAL LOW (ref 13.0–17.0)
LYMPHS PCT: 25 %
Lymphs Abs: 0.8 10*3/uL (ref 0.7–4.0)
MCH: 31.1 pg (ref 26.0–34.0)
MCHC: 32.2 g/dL (ref 30.0–36.0)
MCV: 96.5 fL (ref 78.0–100.0)
MONO ABS: 0.1 10*3/uL (ref 0.1–1.0)
Monocytes Relative: 4 %
NEUTROS ABS: 2.1 10*3/uL (ref 1.7–7.7)
Neutrophils Relative %: 63 %
PLATELETS: 194 10*3/uL (ref 150–400)
RBC: 3.41 MIL/uL — AB (ref 4.22–5.81)
RDW: 18 % — AB (ref 11.5–15.5)
WBC: 3.3 10*3/uL — AB (ref 4.0–10.5)

## 2015-07-02 LAB — COMPREHENSIVE METABOLIC PANEL
ALBUMIN: 2.9 g/dL — AB (ref 3.5–5.0)
ALT: 106 U/L — AB (ref 17–63)
AST: 131 U/L — AB (ref 15–41)
Alkaline Phosphatase: 123 U/L (ref 38–126)
Anion gap: 6 (ref 5–15)
BUN: 27 mg/dL — AB (ref 6–20)
CHLORIDE: 104 mmol/L (ref 101–111)
CO2: 27 mmol/L (ref 22–32)
CREATININE: 1.26 mg/dL — AB (ref 0.61–1.24)
Calcium: 8.2 mg/dL — ABNORMAL LOW (ref 8.9–10.3)
GFR calc Af Amer: >60 mL/min (ref >60)
GFR, EST NON AFRICAN AMERICAN: 55 mL/min — AB (ref >60)
GLUCOSE: 82 mg/dL (ref 65–99)
POTASSIUM: 4.3 mmol/L (ref 3.5–5.1)
Sodium: 137 mmol/L (ref 135–145)
Total Bilirubin: 0.7 mg/dL (ref 0.3–1.2)
Total Protein: 6.4 g/dL — ABNORMAL LOW (ref 6.5–8.1)

## 2015-07-02 NOTE — Progress Notes (Signed)
Patient to get Xgeva at next chemo appointment per Robynn Pane, PA.

## 2015-07-02 NOTE — Assessment & Plan Note (Signed)
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  Oncology history is updated.  He is Day 5 of Cycle 4.  He is here for a short-interval follow-up following the restart of systemic chemotherapy since his NSTEMI.  He saw Dr. Harl Bowie this AM.  His note is not yet ready to be reviewed.  Harry Franklin reports that he is set-up for a CARDIAC STRESS TEST on 2/14.    His labs are updated today.  He is due for Xgeva, but I will defer this until next week when he is here for chemotherapy.  His corrected calcium today is 9.1.  He will return next week, 2/15 for day 8 of treatment.  He will be seen in follow-up at that time.

## 2015-07-02 NOTE — Patient Instructions (Signed)
Your physician recommends that you schedule a follow-up appointment in:  1 month Dr Harl Bowie   Your physician has requested that you have a lexiscan myoview. For further information please visit [email or web address]. Please follow instruction sheet, as given.    Your physician recommends that you continue on your current medications as directed. Please refer to the Current Medication list given to you today.        Thank you for choosing [facility] !

## 2015-07-02 NOTE — Progress Notes (Signed)
Patient ID: Harry Franklin, male   DOB: [birth year], 75 y.o.   MRN: [id number]     Clinical Summary Harry Franklin is a 75 y.o.male seen today for follow up of the following medical problems.   1. NSTEMI - admit Jan 2017 with NSTEMI with peak trop 8.68, no EKG changes. Echo with LVEF 40-45% with WMAs primarily involving the apex. No cardiopulmonary symptoms. 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 Given his diffusely metastatic bladder cancer and severe pancytopenia due to recent chemotherapy he was managed medically. No ASA or plavix due to significant thrombocytopenia - There is some chance that as opposed to obstructive CAD as the etiology this could be a stress induced CM/Takotsubo, especially given the primarily apical hypokinesis seen on echo. Without invasive testing this cannot be distinguished. Repeat echo however shows persistent WMAs suggesting that ischemic heart disease is more likely  - denies any chest pain. Can have some occasional SOB/DOE.    2. Afib - new diagnosis during recent admit, was not anticoagulated due to severe pancytopenia and metastatic CA.  - denies any palpitations.    3. Bladder CA - From literature review cardiotoxicity of carboplatin/gemcitabine are not common, and primarily are described as case reports within the literature. At this time I do not believe these agents were directly responsible for the cardiac event the patient had. The much more likely diagnosis is acute obstructive CAD vs stress induced CM.  - he continues to follow with oncology, they have recent cut back his regimen due to pancytopena.  Past Medical History  Diagnosis Date  . GERD (gastroesophageal reflux disease)   . Hypokalemia   . Hypertension   . Cataract   . Arthritis   . Macular degeneration, age related   . DVT (deep venous thrombosis) ([place])     developed after traveling x 1, developed another after left foot surgery  . Hydrocele in adult   . 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 Rabeprazole     Other reaction(s): DIARRHEA  . Vancomycin Hives and Rash     Current Outpatient Prescriptions  Medication Sig Dispense Refill  . atorvastatin (LIPITOR) 80 MG tablet Take 1 tablet (80 mg total) by mouth daily at 6 PM. 90 tablet 3  . calcium carbonate (TUMS EX) 750 MG chewable tablet Chew 2 tablets by mouth daily.     . carvedilol (COREG) 6.25 MG tablet Take 1 & 1/2 tablets two times daily 135 tablet 3  . hydrocortisone cream 1 % Apply 1 application topically 2 (two) times daily as needed for itching. Reported on 06/28/2015    . lidocaine-prilocaine (EMLA) cream Reported on 06/28/2015    . lisinopril (PRINIVIL,ZESTRIL) 2.5 MG tablet Take 1 tablet (2.5 mg total) by mouth daily. 90 tablet 3  . Multiple Vitamins-Minerals (PRESERVISION AREDS 2 PO) Take 1 tablet by mouth 2 (two) times daily.    Marland Kitchen omeprazole (PRILOSEC) 40 MG capsule Take 40 mg by mouth daily.     . ondansetron (ZOFRAN) 8 MG tablet Take 1 tablet (8 mg total) by mouth every 8 (eight) hours as needed for nausea or vomiting. (Patient not taking: Reported on 06/28/2015) 30 tablet 2  . oxyCODONE (OXY IR/ROXICODONE) 5 MG immediate release tablet Take 1-2 tablets (5-10 mg total) by mouth every 4 (four) hours as needed for severe pain. (Patient not taking: Reported on 06/28/2015) 60 tablet 0   No current facility-administered medications for this visit.  Past Surgical History  Procedure Laterality Date  . Replacement total knee    . Appendectomy    . Hammer toe surgery Left   . Lapidus procedure    . Hallux fusion    . Colonoscopy  03/31/05  . Colonoscopy N/A 10/02/2014    [id number] diverticulosis  . Esophagogastroduodenoscopy N/A 10/02/2014    [id number] ulcerative reflux/s/p dilation/large HH  . Maloney dilation N/A 10/02/2014    Procedure: Venia Minks DILATION;  Surgeon: Daneil Dolin, MD;  [facility];  Service: Endoscopy;  Laterality: N/A;  .  Esophagogastroduodenoscopy N/A 01/29/2015    Procedure: ESOPHAGOGASTRODUODENOSCOPY (EGD);  Surgeon: Daneil Dolin, MD;  [facility];  Service: Endoscopy;  Laterality: N/A;  0800-rescheduled to 9/9 @ 915 Candy notified pt  . Esophageal dilation  01/29/2015    Procedure: ESOPHAGEAL DILATION;  Surgeon: Daneil Dolin, MD;  [facility];  Service: Endoscopy;;  . Portacath placement Left 04/13/15     Allergies  Allergen Reactions  . Rabeprazole     Other reaction(s): DIARRHEA  . Vancomycin Hives and Rash      Family History  Problem Relation Age of Onset  . Colon cancer Maternal Uncle   . Heart disease Father      Social History Mr. Catala reports that he quit smoking about 31 years ago. His smoking use included Cigarettes. He has a 31 pack-year smoking history. He has never used smokeless tobacco. Mr. Lico reports that he does not drink alcohol.   Review of Systems CONSTITUTIONAL: No weight loss, fever, chills, weakness or fatigue.  HEENT: Eyes: No visual loss, blurred vision, double vision or yellow sclerae.No hearing loss, sneezing, congestion, runny nose or sore throat.  SKIN: No rash or itching.  CARDIOVASCULAR: per HPI RESPIRATORY: No cough or sputum.  GASTROINTESTINAL: No anorexia, nausea, vomiting or diarrhea. No abdominal pain or blood.  GENITOURINARY: No burning on urination, no polyuria NEUROLOGICAL: No headache, dizziness, syncope, paralysis, ataxia, numbness or tingling in the extremities. No change in bowel or bladder control.  MUSCULOSKELETAL: No muscle, back pain, joint pain or stiffness.  LYMPHATICS: No enlarged nodes. No history of splenectomy.  PSYCHIATRIC: No history of depression or anxiety.  ENDOCRINOLOGIC: No reports of sweating, cold or heat intolerance. No polyuria or polydipsia.  Marland Kitchen   Physical Examination Filed Vitals:   07/02/15 0938  BP: 120/78  Pulse: 76   Filed Vitals:   07/02/15 0938  Height: 6\' 4"  (1.93 m)    Weight: 279 lb (126.554 kg)    Gen: resting comfortably, no acute distress HEENT: no scleral icterus, pupils equal round and reactive, no palptable cervical adenopathy,  CV: RRR, no m/r/g, no jvd Resp: Clear to auscultation bilaterally GI: abdomen is soft, non-tender, non-distended, normal bowel sounds, no hepatosplenomegaly MSK: extremities are warm, no edema.  Skin: warm, no rash Neuro:  no focal deficits Psych: appropriate affect   Diagnostic Studies 06/2015 echo - Left ventricle: The cavity size was at the upper limits of normal. Wall thickness was at the upper limits of normal. Systolic function was mildly to moderately reduced. The estimated ejection fraction was in the range of 40% to 45%. There is severe hypokinesis of the mid-apicalanteroseptal, anterior, inferior, and apical myocardium. Features are consistent with a pseudonormal left ventricular filling pattern, with concomitant abnormal relaxation and increased filling pressure (grade 2 diastolic dysfunction). - Aortic valve: Mildly calcified annulus. Trileaflet; mildly calcified leaflets. - Mitral valve: There was mild regurgitation. - Left atrium: The atrium was  mildly dilated. - Right atrium: Central venous pressure (est): 3 mm Hg. - Tricuspid valve: There was trivial regurgitation. - Pulmonary arteries: PA peak pressure: 19 mm Hg (S). - Pericardium, extracardiac: There was no pericardial effusion.  Impressions:  - Upper normal LV wall thickness and chamber size with LVEF approximately 40-45%. No significant improvement noted in wall motion abnormalities described above in comparison to the recent study. Grade 2 diastolic dysfunction with increased filling pressures. Mild left atrial enlargement. Mild mitral regurgitation. Sclerotic aortic valve without stenosis. Trivial tricuspid regurgitation with PASP 19 mmHg.   Jan 2017 echo Study Conclusions  - Left ventricle: Systolic  function was mildly to moderately reduced. The estimated ejection fraction was in the range of 40% to 45%. - Regional wall motion abnormality: Hypokinesis of the mid-apical anterior, basal-mid anteroseptal, mid anterolateral, apical septal, apical lateral, and apical myocardium. - Limited study with echocontrast to evaluate LV function and wall motion.   Assessment and Plan  1. NSTEMI - with persistently decreased LVEF and WMAs CAD is more likely the etiology as opposed to Takotsubo CM, without invasive testing cannot confirm. Have not pursued invasive testing based on comorbidities described above - we will obtain a lexiscan MPI to better risk stratify his probable CAD - continue ASA as long as platelets >50k, 81mg  daily is sufficient  2. Afib - no recent symptoms, continue current mes - no anticoag due to metastatic bladder CA and recurrent severe pancytopenia with ongoing chemo   F/u 1 month    Arnoldo Lenis, M.D.

## 2015-07-02 NOTE — Patient Instructions (Signed)
[place] at [facility] Discharge Instructions  RECOMMENDATIONS MADE BY THE CONSULTANT AND ANY TEST RESULTS WILL BE SENT TO YOUR REFERRING PHYSICIAN.  Exam and discussion today with Kirby Crigler, PA. Hold Xgeva today - will give at next chemo visit. Chemo and office visit as scheduled.   Thank you for choosing [place] at [facility] to provide your oncology and hematology care.  To afford each patient quality time with our provider, please arrive at least 15 minutes before your scheduled appointment time.   Beginning January 23rd 2017 lab work for the [name] will be done in the  Main lab at [facility] on 1st floor. If you have a lab appointment with the [facility] please come in thru the  Main Entrance and check in at the main information desk  You need to re-schedule your appointment should you arrive 10 or more minutes late.  We strive to give you quality time with our providers, and arriving late affects you and other patients whose appointments are after yours.  Also, if you no show three or more times for appointments you may be dismissed from the clinic at the providers discretion.     Again, thank you for choosing [facility].  Our hope is that these requests will decrease the amount of time that you wait before being seen by our physicians.       _____________________________________________________________  Should you have questions after your visit to [facility], please contact our office at [phone] between the hours of 8:30 a.m. and 4:30 p.m.  Voicemails left after 4:30 p.m. will not be returned until the following business day.  For prescription refill requests, have your pharmacy contact our office.

## 2015-07-02 NOTE — Progress Notes (Signed)
Harry Inches, MD [place] [address]  Metastatic urothelial carcinoma [facility])  CURRENT THERAPY: Carboplatin/Gemcitabine.  INTERVAL HISTORY: Harry Franklin 75 y.o. 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    Metastatic urothelial carcinoma ([place])   04/02/2015 Imaging CT abd/pelvis- Innumberable hepatic masses, B/L adrenal mets, abdominopelvic lymphadenopathy, LLL lung lesion 6 mm, Lytic lesion of right L2 vertebral bosy, circumferential wall thickening of lower thoracic esophagus, mild diffuse bladder wall thickening.   04/02/2015 Pathology Results Urine Cytology- cells present suspicious for malignancy.  Suspicious for high-grade urothelial carcinoma.   04/08/2015 Procedure US guided biopsy of hepatic lesion   04/08/2015 Pathology Results Liver, needle/core biopsy, right - METASTATIC HIGH GRADE CARCINOMA,   04/14/2015 -  Chemotherapy Carboplatin/Gemcitabine Days 1, 8 every 21 days   05/25/2015 Imaging CT with interval response to therapy, improvement in multifocal liver mets, improvement in enlarged abdominal and pelvic adenopahty and bilateral adrenal gland metastatese, stable appearance of lytic lesion in L2   06/02/2015 Miscellaneous Xgeva to reduce the risk of SLE.  120 mg SQ every month.   06/08/2015 - 06/11/2015 Hospital Admission NSTEMI, Afib with RVR, Hypertension, Chemotherapy induced pancyopenia   06/28/2015 Treatment Plan Change Carboplatin dose reduced to AUC of 4 and Gemcitabine dose reduced by 20%    I personally reviewed and went over laboratory results with the patient.  The results are noted within this dictation.  His corrected calcium today is 9.1.  He reports that he tolerated treatment well.  He notes that he has felt good the last two days.  He saw Dr. Harl Bowie earlier today.   Past Medical History  Diagnosis Date  . GERD  (gastroesophageal reflux disease)   . Hypokalemia   . Hypertension   . Cataract   . Arthritis   . Macular degeneration, age related   . DVT (deep venous thrombosis) ([place])     developed after traveling x 1, developed another after left foot surgery  . Hydrocele in adult   . Metastatic urothelial carcinoma ([place]) 04/12/2015    has Dysphagia; Encounter for screening colonoscopy; Dysphagia, pharyngoesophageal phase; Reflux esophagitis; Peptic stricture of esophagus; Hiatal hernia; Abdominal pain; Metastatic urothelial carcinoma ([place]); Dyspnea; Elevated troponin; Atrial fibrillation with rapid ventricular response ([place]); Antineoplastic chemotherapy induced pancytopenia ([place]); and NSTEMI (non-ST elevated myocardial infarction) ([place]) on his problem list.     is allergic to rabeprazole and vancomycin.  Current Outpatient Prescriptions on File Prior to Visit  Medication Sig Dispense Refill  . atorvastatin (LIPITOR) 80 MG tablet Take 1 tablet (80 mg total) by mouth daily at 6 PM. 90 tablet 3  . carvedilol (COREG) 6.25 MG tablet Take 1 & 1/2 tablets two times daily 135 tablet 3  . lisinopril (PRINIVIL,ZESTRIL) 2.5 MG tablet Take 1 tablet (2.5 mg total) by mouth daily. 90 tablet 3  . Multiple Vitamins-Minerals (PRESERVISION AREDS 2 PO) Take 1 tablet by mouth 2 (two) times daily.    Marland Kitchen omeprazole (PRILOSEC) 40 MG capsule Take 40 mg by mouth daily.     . ondansetron (ZOFRAN) 8 MG tablet Take 1 tablet (8 mg total) by mouth every 8 (eight) hours as needed for nausea or vomiting. 30 tablet 2   No current facility-administered medications on file prior to visit.    Past Surgical History  Procedure Laterality Date  . Replacement total knee    . Appendectomy    .  Hammer toe surgery Left   . Lapidus procedure    . Hallux fusion    . Colonoscopy  03/31/05  . Colonoscopy N/A 10/02/2014    [id number] diverticulosis  . Esophagogastroduodenoscopy N/A 10/02/2014    [id number] ulcerative reflux/s/p  dilation/large HH  . Maloney dilation N/A 10/02/2014    Procedure: Venia Minks DILATION;  Surgeon: Daneil Dolin, MD;  [facility];  Service: Endoscopy;  Laterality: N/A;  . Esophagogastroduodenoscopy N/A 01/29/2015    Procedure: ESOPHAGOGASTRODUODENOSCOPY (EGD);  Surgeon: Daneil Dolin, MD;  [facility];  Service: Endoscopy;  Laterality: N/A;  0800-rescheduled to 9/9 @ 915 Candy notified pt  . Esophageal dilation  01/29/2015    Procedure: ESOPHAGEAL DILATION;  Surgeon: Daneil Dolin, MD;  [facility];  Service: Endoscopy;;  . Portacath placement Left 04/13/15    Denies any headaches, dizziness, double vision, fevers, chills, night sweats, nausea, vomiting, diarrhea, constipation, chest pain, heart palpitations, shortness of breath, blood in stool, black tarry stool, urinary pain, urinary burning, urinary frequency, hematuria.   PHYSICAL EXAMINATION  ECOG PERFORMANCE STATUS: 0 - Asymptomatic  Filed Vitals:   07/02/15 1410  BP: 137/76  Pulse: 79  Temp: 98 F (36.7 C)  Resp: 20    GENERAL:alert, no distress, well nourished, well developed, comfortable, cooperative, smiling and unaccompanied SKIN: skin color, texture, turgor are normal, no rashes or significant lesions HEAD: Normocephalic, No masses, lesions, tenderness or abnormalities EYES: normal, Conjunctiva are pink and non-injected EARS: External ears normal OROPHARYNX:lips, buccal mucosa, and tongue normal and mucous membranes are moist  NECK: supple, trachea midline LYMPH:  not examined BREAST:not examined LUNGS: not examined HEART: not examined ABDOMEN:normal bowel sounds BACK: Back symmetric, no curvature. EXTREMITIES:less then 2 second capillary refill, no skin discoloration  NEURO: alert & oriented x 3 with fluent speech, no focal motor/sensory deficits, gait normal   LABORATORY DATA: CBC    Component Value Date/Time   WBC 3.3* 07/02/2015 1028   RBC 3.41* 07/02/2015 1028   HGB  10.6* 07/02/2015 1028   HCT 32.9* 07/02/2015 1028   PLT 194 07/02/2015 1028   MCV 96.5 07/02/2015 1028   MCH 31.1 07/02/2015 1028   MCHC 32.2 07/02/2015 1028   RDW 18.0* 07/02/2015 1028   LYMPHSABS 0.8 07/02/2015 1028   MONOABS 0.1 07/02/2015 1028   EOSABS 0.2 07/02/2015 1028   BASOSABS 0.0 07/02/2015 1028      Chemistry      Component Value Date/Time   NA 137 07/02/2015 1028   K 4.3 07/02/2015 1028   CL 104 07/02/2015 1028   CO2 27 07/02/2015 1028   BUN 27* 07/02/2015 1028   CREATININE 1.26* 07/02/2015 1028      Component Value Date/Time   CALCIUM 8.2* 07/02/2015 1028   ALKPHOS 123 07/02/2015 1028   AST 131* 07/02/2015 1028   ALT 106* 07/02/2015 1028   BILITOT 0.7 07/02/2015 1028        PENDING LABS:   RADIOGRAPHIC STUDIES:  Ct Angio Chest Pe W/cm &/or Wo Cm  06/08/2015  CLINICAL DATA:  Shortness of breath, tachycardia starting 05/16/2015, metastatic urothelial cancer on chemotherapy EXAM: CT ANGIOGRAPHY CHEST WITH CONTRAST TECHNIQUE: Multidetector CT imaging of the chest was performed using the standard protocol during bolus administration of intravenous contrast. Multiplanar CT image reconstructions and MIPs were obtained to evaluate the vascular anatomy. CONTRAST:  161mL OMNIPAQUE IOHEXOL 350 MG/ML SOLN COMPARISON:  05/25/2015 FINDINGS: Images of the thoracic inlet are unremarkable. Emphysematous changes bilateral upper lobe  again noted. Stable bilateral apical scarring right greater than left. There is no mediastinal hematoma or adenopathy. Small hiatal hernia. The study is of excellent technical quality. No pulmonary embolus is noted. A right hilar lymph node again noted measures 9 mm short-axis stable from prior exam. There is small bilateral pleural effusion. Mild atelectasis noted bilateral lower lobe posteriorly. Sagittal images of the spine shows degenerative changes thoracic spine. Diffuse osteopenia. No destructive bony lesions are noted. Minimal perihilar  bronchitic changes. No segmental infiltrate or pulmonary edema. No destructive rib lesions are noted. The visualized upper abdomen is stable. No adrenal gland mass is noted. Atherosclerotic calcifications of coronary arteries again noted. Heart size is within normal limits. No pericardial effusion. Right IJ Port-A-Cath in place. Review of the MIP images confirms the above findings. IMPRESSION: 1. No pulmonary embolus is noted. 2. Again noted emphysematous changes bilateral upper lobe. 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 Again noted bilateral apical pleural parenchymal scarring right greater than left. 3. No mediastinal hematoma or adenopathy. 4. Stable borderline enlarged right hilar lymph node. 5. No infiltrate or pulmonary edema. Bilateral small pleural effusion with bilateral lower lobe posterior atelectasis. Electronically Signed   By: Lahoma Crocker M.D.   On: 06/08/2015 10:56   US Venous Img Upper Uni Left  06/10/2015  CLINICAL DATA:  Left upper extremity edema, pain EXAM: LEFT UPPER EXTREMITY VENOUS DOPPLER ULTRASOUND TECHNIQUE: Gray-scale sonography with graded compression, as well as color Doppler and duplex ultrasound were performed to evaluate the upper extremity deep venous system from the level of the subclavian vein and including the jugular, axillary, basilic, radial, ulnar and upper cephalic vein. Spectral Doppler was utilized to evaluate flow at rest and with distal augmentation maneuvers. COMPARISON:  None. FINDINGS: Contralateral Subclavian Vein: Respiratory phasicity is normal and symmetric with the symptomatic side. No evidence of thrombus. Normal compressibility. Internal Jugular Vein: No evidence of thrombus. Normal compressibility, respiratory phasicity and response to augmentation. Subclavian Vein: No evidence of thrombus. Normal compressibility, respiratory phasicity and response to augmentation. Axillary Vein: No evidence of thrombus. Normal compressibility, respiratory  phasicity and response to augmentation. Cephalic Vein: No evidence of thrombus. Normal compressibility, respiratory phasicity and response to augmentation. Basilic Vein: No evidence of thrombus. Normal compressibility, respiratory phasicity and response to augmentation. Brachial Veins: No evidence of thrombus. Normal compressibility, respiratory phasicity and response to augmentation. Radial Veins: No evidence of thrombus. Normal compressibility, respiratory phasicity and response to augmentation. Ulnar Veins: No evidence of thrombus. Normal compressibility, respiratory phasicity and response to augmentation. Venous Reflux:  None visualized. Other Findings:  Forearm region subcutaneous edema noted. IMPRESSION: No evidence of deep venous thrombosis. Electronically Signed   By: Jerilynn Mages.  Shick M.D.   On: 06/10/2015 12:13     PATHOLOGY:    ASSESSMENT AND PLAN:  Metastatic urothelial carcinoma (HCC) Metastatic, high grade urothelial cancer undergoing active treatment with Carboplatin/Gemcitabine on 04/14/2015.  Oncology history is updated.  He is Day 5 of Cycle 4.  He is here for a short-interval follow-up following the restart of systemic chemotherapy since his NSTEMI.  He saw Dr. Harl Bowie this AM.  His note is not yet ready to be reviewed.  Mr. Luedeman reports that he is set-up for a CARDIAC STRESS TEST on 2/14.    His labs are updated today.  He is due for Xgeva, but I will defer this until next week when he is here for chemotherapy.  His corrected calcium today is 9.1.  He will return next week, 2/15  for day 8 of treatment.  He will be seen in follow-up at that time.    THERAPY PLAN:  He will continue with treatment.  His Carboplatin has been reduced to an AUC of 4 and Gemcitabine has been reduced by 20%.  All questions were answered. The patient knows to call the clinic with any problems, questions or concerns. We can certainly see the patient much sooner if necessary.  Patient and plan  discussed with Dr. Ancil Linsey and she is in agreement with the aforementioned.   This note is electronically signed by: Doy Mince 07/02/2015 3:22 PM

## 2015-07-05 ENCOUNTER — Telehealth (HOSPITAL_COMMUNITY): Payer: Self-pay | Admitting: *Deleted

## 2015-07-05 NOTE — Telephone Encounter (Addendum)
Patient called to report that he had to strain just a little bit with having a BM and saw some blood on the tissue. Pt states that it was bright red but it was just a little bit. Dr. Whitney Muse stated that she thought that was ok. I advised pt to continue to monitor his stools. He said ok. Chemo schedule to be made correct also.

## 2015-07-06 ENCOUNTER — Encounter (HOSPITAL_COMMUNITY)
Admission: RE | Admit: 2015-07-06 | Discharge: 2015-07-06 | Disposition: A | Discharge status: Home / Self Care | Payer: Medicare HMO | Source: Ambulatory Visit | Attending: Cardiology | Admitting: Cardiology

## 2015-07-06 ENCOUNTER — Inpatient Hospital Stay (HOSPITAL_COMMUNITY): Admission: RE | Admit: 2015-07-06 | Payer: Medicare HMO | Source: Ambulatory Visit

## 2015-07-06 ENCOUNTER — Encounter (HOSPITAL_COMMUNITY): Payer: Self-pay

## 2015-07-06 DIAGNOSIS — I5189 Other ill-defined heart diseases: Secondary | ICD-10-CM | POA: Diagnosis not present

## 2015-07-06 DIAGNOSIS — I252 Old myocardial infarction: Secondary | ICD-10-CM | POA: Diagnosis not present

## 2015-07-06 DIAGNOSIS — R931 Abnormal findings on diagnostic imaging of heart and coronary circulation: Secondary | ICD-10-CM | POA: Insufficient documentation

## 2015-07-06 DIAGNOSIS — R0602 Shortness of breath: Secondary | ICD-10-CM | POA: Insufficient documentation

## 2015-07-06 LAB — NM MYOCAR MULTI W/SPECT W/WALL MOTION / EF
CHL CUP NUCLEAR SSS: 23
CSEPPHR: 102 {beats}/min
LHR: 0.46
LVDIAVOL: 176 mL
LVSYSVOL: 111 mL
NUC STRESS TID: 1.13
Rest HR: 89 {beats}/min
SDS: 3
SRS: 20

## 2015-07-06 MED ORDER — TECHNETIUM TC 99M SESTAMIBI - CARDIOLITE
30.0000 | Freq: Once | INTRAVENOUS | Status: AC | PRN
  Administered 2015-07-06: 12:00:00 30 via INTRAVENOUS

## 2015-07-06 MED ORDER — TECHNETIUM TC 99M SESTAMIBI GENERIC - CARDIOLITE
10.0000 | Freq: Once | INTRAVENOUS | Status: AC | PRN
  Administered 2015-07-06: 10 via INTRAVENOUS

## 2015-07-06 MED ORDER — REGADENOSON 0.4 MG/5ML IV SOLN
INTRAVENOUS | Status: AC
  Administered 2015-07-06: 0.4 mg via INTRAVENOUS
  Filled 2015-07-06: qty 5

## 2015-07-06 MED ORDER — SODIUM CHLORIDE 0.9% FLUSH
INTRAVENOUS | Status: AC
  Administered 2015-07-06: 10 mL via INTRAVENOUS
  Filled 2015-07-06: qty 10

## 2015-07-07 ENCOUNTER — Encounter (HOSPITAL_COMMUNITY): Payer: Medicare HMO

## 2015-07-07 ENCOUNTER — Encounter (HOSPITAL_BASED_OUTPATIENT_CLINIC_OR_DEPARTMENT_OTHER): Payer: Medicare HMO

## 2015-07-07 VITALS — BP 110/55 | HR 75 | Temp 97.5°F | Resp 18 | Wt 276.4 lb

## 2015-07-07 DIAGNOSIS — C649 Malignant neoplasm of unspecified kidney, except renal pelvis: Secondary | ICD-10-CM | POA: Diagnosis not present

## 2015-07-07 DIAGNOSIS — Z5111 Encounter for antineoplastic chemotherapy: Secondary | ICD-10-CM

## 2015-07-07 DIAGNOSIS — C7910 Secondary malignant neoplasm of unspecified urinary organs: Secondary | ICD-10-CM

## 2015-07-07 DIAGNOSIS — M899 Disorder of bone, unspecified: Secondary | ICD-10-CM | POA: Diagnosis not present

## 2015-07-07 DIAGNOSIS — C787 Secondary malignant neoplasm of liver and intrahepatic bile duct: Secondary | ICD-10-CM

## 2015-07-07 DIAGNOSIS — C7971 Secondary malignant neoplasm of right adrenal gland: Secondary | ICD-10-CM | POA: Diagnosis not present

## 2015-07-07 DIAGNOSIS — C7972 Secondary malignant neoplasm of left adrenal gland: Secondary | ICD-10-CM

## 2015-07-07 LAB — COMPREHENSIVE METABOLIC PANEL
ALBUMIN: 3 g/dL — AB (ref 3.5–5.0)
ALK PHOS: 139 U/L — AB (ref 38–126)
ALT: 64 U/L — AB (ref 17–63)
ANION GAP: 7 (ref 5–15)
AST: 58 U/L — AB (ref 15–41)
BUN: 20 mg/dL (ref 6–20)
CALCIUM: 8.2 mg/dL — AB (ref 8.9–10.3)
CO2: 25 mmol/L (ref 22–32)
CREATININE: 1.04 mg/dL (ref 0.61–1.24)
Chloride: 108 mmol/L (ref 101–111)
GFR calc Af Amer: >60 mL/min (ref >60)
GFR calc non Af Amer: >60 mL/min (ref >60)
GLUCOSE: 110 mg/dL — AB (ref 65–99)
Potassium: 4 mmol/L (ref 3.5–5.1)
SODIUM: 140 mmol/L (ref 135–145)
Total Bilirubin: 0.7 mg/dL (ref 0.3–1.2)
Total Protein: 6.6 g/dL (ref 6.5–8.1)

## 2015-07-07 LAB — CBC WITH DIFFERENTIAL/PLATELET
BASOS PCT: 0 %
Basophils Absolute: 0 10*3/uL (ref 0.0–0.1)
EOS ABS: 0.1 10*3/uL (ref 0.0–0.7)
Eosinophils Relative: 3 %
HEMATOCRIT: 31.2 % — AB (ref 39.0–52.0)
HEMOGLOBIN: 10.2 g/dL — AB (ref 13.0–17.0)
Lymphocytes Relative: 24 %
Lymphs Abs: 0.8 10*3/uL (ref 0.7–4.0)
MCH: 31.4 pg (ref 26.0–34.0)
MCHC: 32.7 g/dL (ref 30.0–36.0)
MCV: 96 fL (ref 78.0–100.0)
Monocytes Absolute: 0.4 10*3/uL (ref 0.1–1.0)
Monocytes Relative: 11 %
NEUTROS ABS: 2.1 10*3/uL (ref 1.7–7.7)
NEUTROS PCT: 62 %
Platelets: 107 10*3/uL — ABNORMAL LOW (ref 150–400)
RBC: 3.25 MIL/uL — AB (ref 4.22–5.81)
RDW: 17.9 % — ABNORMAL HIGH (ref 11.5–15.5)
WBC: 3.4 10*3/uL — AB (ref 4.0–10.5)

## 2015-07-07 MED ORDER — DENOSUMAB 120 MG/1.7ML ~~LOC~~ SOLN
120.0000 mg | Freq: Once | SUBCUTANEOUS | Status: AC
  Administered 2015-07-07: 120 mg via SUBCUTANEOUS
  Filled 2015-07-07: qty 1.7

## 2015-07-07 MED ORDER — SODIUM CHLORIDE 0.9 % IJ SOLN
10.0000 mL | INTRAMUSCULAR | Status: DC | PRN
  Administered 2015-07-07: 10 mL
  Filled 2015-07-07: qty 10

## 2015-07-07 MED ORDER — SODIUM CHLORIDE 0.9 % IV SOLN
Freq: Once | INTRAVENOUS | Status: AC
  Administered 2015-07-07: 10:00:00 via INTRAVENOUS

## 2015-07-07 MED ORDER — SODIUM CHLORIDE 0.9 % IV SOLN
600.0000 mg/m2 | Freq: Once | INTRAVENOUS | Status: AC
  Administered 2015-07-07: 1520 mg via INTRAVENOUS
  Filled 2015-07-07: qty 39.98

## 2015-07-07 MED ORDER — HEPARIN SOD (PORK) LOCK FLUSH 100 UNIT/ML IV SOLN
500.0000 [IU] | Freq: Once | INTRAVENOUS | Status: AC | PRN
  Administered 2015-07-07: 500 [IU]
  Filled 2015-07-07: qty 5

## 2015-07-07 MED ORDER — SODIUM CHLORIDE 0.9 % IV SOLN
8.0000 mg | Freq: Once | INTRAVENOUS | Status: DC
  Filled 2015-07-07: qty 4

## 2015-07-07 MED ORDER — SODIUM CHLORIDE 0.9 % IV SOLN
Freq: Once | INTRAVENOUS | Status: AC
  Administered 2015-07-07: 8 mg via INTRAVENOUS
  Filled 2015-07-07: qty 4

## 2015-07-07 MED ORDER — PROCHLORPERAZINE MALEATE 10 MG PO TABS
10.0000 mg | ORAL_TABLET | Freq: Once | ORAL | Status: AC
  Administered 2015-07-07: 10 mg via ORAL

## 2015-07-07 MED ORDER — PROCHLORPERAZINE MALEATE 10 MG PO TABS
ORAL_TABLET | ORAL | Status: AC
  Filled 2015-07-07: qty 1

## 2015-07-07 MED ORDER — SODIUM CHLORIDE 0.9 % IV SOLN
10.0000 mg | Freq: Once | INTRAVENOUS | Status: DC
  Filled 2015-07-07: qty 1

## 2015-07-07 NOTE — Patient Instructions (Signed)
[facility] Discharge Instructions for Patients Receiving Chemotherapy  Today you received the following chemotherapy agents Gemzar Day 8. Xgeva 120 mg injection given as ordered. Lab work again next week to monitor platelet count.  To help prevent nausea and vomiting after your treatment, we encourage you to take your nausea medication as instructed. If you develop nausea and vomiting that is not controlled by your nausea medication, call the clinic. If it is after clinic hours your family physician or the after hours number for the clinic or go to the Emergency Department. BELOW ARE SYMPTOMS THAT SHOULD BE REPORTED IMMEDIATELY:  *FEVER GREATER THAN 101.0 F  *CHILLS WITH OR WITHOUT FEVER  NAUSEA AND VOMITING THAT IS NOT CONTROLLED WITH YOUR NAUSEA MEDICATION  *UNUSUAL SHORTNESS OF BREATH  *UNUSUAL BRUISING OR BLEEDING  TENDERNESS IN MOUTH AND THROAT WITH OR WITHOUT PRESENCE OF ULCERS  *URINARY PROBLEMS  *BOWEL PROBLEMS  UNUSUAL RASH Items with * indicate a potential emergency and should be followed up as soon as possible.  Return as scheduled.  I have been informed and understand all the instructions given to me. I know to contact the clinic, my physician, or go to the Emergency Department if any problems should occur. I do not have any questions at this time, but understand that I may call the clinic during office hours or the Patient Navigator at [phone] should I have any questions or need assistance in obtaining follow up care.    __________________________________________  _____________  __________ Signature of Patient or Authorized Representative            Date                   Time    __________________________________________ Nurse's Signature

## 2015-07-07 NOTE — Progress Notes (Signed)
Reports increased fatigue last Wednesday after chemo on Monday. Reports he was back to his normal self on Thursday.  Hemoglobin 10.2 today. Aranesp not needed, treatment parameters not met.  Per Dr.Penland OK for Xgeva injection today.  Paul Dykes presents today for injection per MD orders. Xgeva 120 mg  administered SQ in left Abdomen. Administration without incident. Patient tolerated well.   Patient tolerated chemo well. Ambulatory on discharge home to self with son.

## 2015-07-14 ENCOUNTER — Encounter (HOSPITAL_COMMUNITY): Payer: Medicare HMO

## 2015-07-14 ENCOUNTER — Inpatient Hospital Stay (HOSPITAL_COMMUNITY): Payer: Medicare HMO

## 2015-07-14 DIAGNOSIS — C7910 Secondary malignant neoplasm of unspecified urinary organs: Secondary | ICD-10-CM | POA: Diagnosis not present

## 2015-07-14 LAB — CBC WITH DIFFERENTIAL/PLATELET
BASOS ABS: 0 10*3/uL (ref 0.0–0.1)
Basophils Relative: 0 %
EOS PCT: 1 %
Eosinophils Absolute: 0 10*3/uL (ref 0.0–0.7)
HCT: 29.8 % — ABNORMAL LOW (ref 39.0–52.0)
HEMOGLOBIN: 9.8 g/dL — AB (ref 13.0–17.0)
LYMPHS ABS: 0.7 10*3/uL (ref 0.7–4.0)
LYMPHS PCT: 40 %
MCH: 31.8 pg (ref 26.0–34.0)
MCHC: 32.9 g/dL (ref 30.0–36.0)
MCV: 96.8 fL (ref 78.0–100.0)
MONOS PCT: 26 %
Monocytes Absolute: 0.5 10*3/uL (ref 0.1–1.0)
NEUTROS ABS: 0.6 10*3/uL — AB (ref 1.7–7.7)
Neutrophils Relative %: 34 %
PLATELETS: 50 10*3/uL — AB (ref 150–400)
RBC: 3.08 MIL/uL — ABNORMAL LOW (ref 4.22–5.81)
RDW: 18.5 % — ABNORMAL HIGH (ref 11.5–15.5)
SMEAR REVIEW: DECREASED
WBC: 1.9 10*3/uL — ABNORMAL LOW (ref 4.0–10.5)

## 2015-07-21 ENCOUNTER — Encounter (HOSPITAL_COMMUNITY): Payer: Medicare HMO | Attending: Hematology & Oncology

## 2015-07-21 ENCOUNTER — Encounter (HOSPITAL_COMMUNITY): Payer: Self-pay | Admitting: *Deleted

## 2015-07-21 ENCOUNTER — Encounter (HOSPITAL_COMMUNITY): Payer: Self-pay

## 2015-07-21 VITALS — BP 119/59 | HR 71 | Temp 98.0°F | Resp 18 | Wt 278.8 lb

## 2015-07-21 DIAGNOSIS — C787 Secondary malignant neoplasm of liver and intrahepatic bile duct: Secondary | ICD-10-CM

## 2015-07-21 DIAGNOSIS — Z5111 Encounter for antineoplastic chemotherapy: Secondary | ICD-10-CM

## 2015-07-21 DIAGNOSIS — C649 Malignant neoplasm of unspecified kidney, except renal pelvis: Secondary | ICD-10-CM | POA: Diagnosis not present

## 2015-07-21 DIAGNOSIS — C7972 Secondary malignant neoplasm of left adrenal gland: Secondary | ICD-10-CM

## 2015-07-21 DIAGNOSIS — D6481 Anemia due to antineoplastic chemotherapy: Secondary | ICD-10-CM

## 2015-07-21 DIAGNOSIS — T451X5A Adverse effect of antineoplastic and immunosuppressive drugs, initial encounter: Secondary | ICD-10-CM

## 2015-07-21 DIAGNOSIS — C7910 Secondary malignant neoplasm of unspecified urinary organs: Secondary | ICD-10-CM | POA: Diagnosis not present

## 2015-07-21 DIAGNOSIS — C7971 Secondary malignant neoplasm of right adrenal gland: Secondary | ICD-10-CM | POA: Diagnosis not present

## 2015-07-21 DIAGNOSIS — R296 Repeated falls: Secondary | ICD-10-CM

## 2015-07-21 LAB — IRON AND TIBC
Iron: 76 ug/dL (ref 45–182)
SATURATION RATIOS: 23 % (ref 17.9–39.5)
TIBC: 326 ug/dL (ref 250–450)
UIBC: 250 ug/dL

## 2015-07-21 LAB — COMPREHENSIVE METABOLIC PANEL
ALBUMIN: 3 g/dL — AB (ref 3.5–5.0)
ALT: 22 U/L (ref 17–63)
ANION GAP: 5 (ref 5–15)
AST: 28 U/L (ref 15–41)
Alkaline Phosphatase: 104 U/L (ref 38–126)
BILIRUBIN TOTAL: 0.7 mg/dL (ref 0.3–1.2)
BUN: 21 mg/dL — AB (ref 6–20)
CO2: 26 mmol/L (ref 22–32)
Calcium: 8 mg/dL — ABNORMAL LOW (ref 8.9–10.3)
Chloride: 106 mmol/L (ref 101–111)
Creatinine, Ser: 1.03 mg/dL (ref 0.61–1.24)
GFR calc Af Amer: >60 mL/min (ref >60)
GFR calc non Af Amer: >60 mL/min (ref >60)
GLUCOSE: 80 mg/dL (ref 65–99)
POTASSIUM: 4.2 mmol/L (ref 3.5–5.1)
SODIUM: 137 mmol/L (ref 135–145)
TOTAL PROTEIN: 6.1 g/dL — AB (ref 6.5–8.1)

## 2015-07-21 LAB — CBC WITH DIFFERENTIAL/PLATELET
BASOS ABS: 0 10*3/uL (ref 0.0–0.1)
BASOS PCT: 0 %
EOS ABS: 0.1 10*3/uL (ref 0.0–0.7)
Eosinophils Relative: 2 %
HEMATOCRIT: 29.2 % — AB (ref 39.0–52.0)
HEMOGLOBIN: 9.5 g/dL — AB (ref 13.0–17.0)
Lymphocytes Relative: 26 %
Lymphs Abs: 1 10*3/uL (ref 0.7–4.0)
MCH: 32.4 pg (ref 26.0–34.0)
MCHC: 32.5 g/dL (ref 30.0–36.0)
MCV: 99.7 fL (ref 78.0–100.0)
Monocytes Absolute: 0.7 10*3/uL (ref 0.1–1.0)
Monocytes Relative: 18 %
NEUTROS ABS: 2 10*3/uL (ref 1.7–7.7)
NEUTROS PCT: 53 %
Platelets: 105 10*3/uL — ABNORMAL LOW (ref 150–400)
RBC: 2.93 MIL/uL — ABNORMAL LOW (ref 4.22–5.81)
RDW: 20.2 % — AB (ref 11.5–15.5)
WBC: 3.8 10*3/uL — ABNORMAL LOW (ref 4.0–10.5)

## 2015-07-21 LAB — FERRITIN: Ferritin: 377 ng/mL — ABNORMAL HIGH (ref 24–336)

## 2015-07-21 MED ORDER — SODIUM CHLORIDE 0.9 % IV SOLN
546.0000 mg | Freq: Once | INTRAVENOUS | Status: AC
  Administered 2015-07-21: 550 mg via INTRAVENOUS
  Filled 2015-07-21: qty 55

## 2015-07-21 MED ORDER — SODIUM CHLORIDE 0.9 % IJ SOLN: 10.0000 mL | INTRAMUSCULAR | Status: DC | PRN

## 2015-07-21 MED ORDER — SODIUM CHLORIDE 0.9 % IV SOLN
Freq: Once | INTRAVENOUS | Status: AC
  Administered 2015-07-21: 11:00:00 via INTRAVENOUS

## 2015-07-21 MED ORDER — SODIUM CHLORIDE 0.9 % IV SOLN
10.0000 mg | Freq: Once | INTRAVENOUS | Status: AC
  Administered 2015-07-21: 10 mg via INTRAVENOUS
  Filled 2015-07-21: qty 1

## 2015-07-21 MED ORDER — SODIUM CHLORIDE 0.9 % IV SOLN
600.0000 mg/m2 | Freq: Once | INTRAVENOUS | Status: AC
  Administered 2015-07-21: 1520 mg via INTRAVENOUS
  Filled 2015-07-21: qty 39.98

## 2015-07-21 MED ORDER — HEPARIN SOD (PORK) LOCK FLUSH 100 UNIT/ML IV SOLN
500.0000 [IU] | Freq: Once | INTRAVENOUS | Status: AC | PRN
  Administered 2015-07-21: 500 [IU]
  Filled 2015-07-21: qty 5

## 2015-07-21 MED ORDER — PALONOSETRON HCL INJECTION 0.25 MG/5ML
0.2500 mg | Freq: Once | INTRAVENOUS | Status: AC
Start: 2015-07-21 — End: 2015-07-21
  Administered 2015-07-21: 0.25 mg via INTRAVENOUS
  Filled 2015-07-21: qty 5

## 2015-07-21 NOTE — Progress Notes (Signed)
..  Harry Franklin arrives today for chemo. Reports having fallen x w last week. Denies headache or blurred vision, states that he stumbled both times. He did not come to ED for a check up after falling. He has bruising under rt chest and soreness and laceration on rt palm/wrist. Tolerated chemo well

## 2015-07-21 NOTE — Patient Instructions (Signed)
..  [facility] Discharge Instructions for Patients Receiving Chemotherapy   Beginning January 23rd 2017 lab work for the [facility] will be done in the  Main lab at [facility] on 1st floor. If you have a lab appointment with the [facility] please come in thru the  Main Entrance and check in at the main information desk   Today you received the following chemotherapy agents gemzar and carboplatin Chemo day 8 next week with appointment with Gershon Mussel also  We will give you your xgeva on 3/15 as ordered We are trying to get you approved for a growth hormone called aranesp to help increase your red blood cell.   If you have another fall please let us know and come to ED if you have injury/bleeding with the fall.   If you develop nausea and vomiting, or diarrhea that is not controlled by your medication, call the clinic.  The clinic phone number is [phone]. Office hours are Monday-Friday 8:30am-5:00pm.  BELOW ARE SYMPTOMS THAT SHOULD BE REPORTED IMMEDIATELY:  *FEVER GREATER THAN 101.0 F  *CHILLS WITH OR WITHOUT FEVER  NAUSEA AND VOMITING THAT IS NOT CONTROLLED WITH YOUR NAUSEA MEDICATION  *UNUSUAL SHORTNESS OF BREATH  *UNUSUAL BRUISING OR BLEEDING  TENDERNESS IN MOUTH AND THROAT WITH OR WITHOUT PRESENCE OF ULCERS  *URINARY PROBLEMS  *BOWEL PROBLEMS  UNUSUAL RASH Items with * indicate a potential emergency and should be followed up as soon as possible. If you have an emergency after office hours please contact your primary care physician or go to the nearest emergency department.  Please call the clinic during office hours if you have any questions or concerns.   You may also contact the Patient Navigator at [phone] should you have any questions or need assistance in obtaining follow up care.

## 2015-07-22 ENCOUNTER — Emergency Department (HOSPITAL_COMMUNITY)
Admission: EM | Admit: 2015-07-22 | Discharge: 2015-07-22 | Disposition: A | Discharge status: Home / Self Care | Payer: Medicare HMO | Attending: Emergency Medicine | Admitting: Emergency Medicine

## 2015-07-22 ENCOUNTER — Encounter (HOSPITAL_COMMUNITY): Payer: Self-pay

## 2015-07-22 ENCOUNTER — Other Ambulatory Visit: Payer: Self-pay

## 2015-07-22 ENCOUNTER — Encounter (HOSPITAL_BASED_OUTPATIENT_CLINIC_OR_DEPARTMENT_OTHER): Payer: Medicare HMO

## 2015-07-22 VITALS — BP 141/71 | HR 85 | Temp 97.8°F | Resp 18

## 2015-07-22 DIAGNOSIS — Z79899 Other long term (current) drug therapy: Secondary | ICD-10-CM | POA: Insufficient documentation

## 2015-07-22 DIAGNOSIS — Z8669 Personal history of other diseases of the nervous system and sense organs: Secondary | ICD-10-CM | POA: Diagnosis not present

## 2015-07-22 DIAGNOSIS — Z8551 Personal history of malignant neoplasm of bladder: Secondary | ICD-10-CM | POA: Insufficient documentation

## 2015-07-22 DIAGNOSIS — D6481 Anemia due to antineoplastic chemotherapy: Secondary | ICD-10-CM

## 2015-07-22 DIAGNOSIS — M1990 Unspecified osteoarthritis, unspecified site: Secondary | ICD-10-CM | POA: Diagnosis not present

## 2015-07-22 DIAGNOSIS — K219 Gastro-esophageal reflux disease without esophagitis: Secondary | ICD-10-CM | POA: Insufficient documentation

## 2015-07-22 DIAGNOSIS — Z87891 Personal history of nicotine dependence: Secondary | ICD-10-CM | POA: Insufficient documentation

## 2015-07-22 DIAGNOSIS — C7910 Secondary malignant neoplasm of unspecified urinary organs: Secondary | ICD-10-CM | POA: Diagnosis not present

## 2015-07-22 DIAGNOSIS — R002 Palpitations: Secondary | ICD-10-CM | POA: Insufficient documentation

## 2015-07-22 DIAGNOSIS — Z8639 Personal history of other endocrine, nutritional and metabolic disease: Secondary | ICD-10-CM | POA: Insufficient documentation

## 2015-07-22 DIAGNOSIS — Z86718 Personal history of other venous thrombosis and embolism: Secondary | ICD-10-CM | POA: Insufficient documentation

## 2015-07-22 DIAGNOSIS — I10 Essential (primary) hypertension: Secondary | ICD-10-CM | POA: Diagnosis not present

## 2015-07-22 DIAGNOSIS — Z87448 Personal history of other diseases of urinary system: Secondary | ICD-10-CM | POA: Insufficient documentation

## 2015-07-22 DIAGNOSIS — R000 Tachycardia, unspecified: Secondary | ICD-10-CM | POA: Diagnosis present

## 2015-07-22 DIAGNOSIS — Z7982 Long term (current) use of aspirin: Secondary | ICD-10-CM | POA: Insufficient documentation

## 2015-07-22 LAB — TROPONIN I

## 2015-07-22 MED ORDER — DARBEPOETIN ALFA 500 MCG/ML IJ SOSY
500.0000 ug | PREFILLED_SYRINGE | Freq: Once | INTRAMUSCULAR | Status: AC
  Administered 2015-07-22: 500 ug via SUBCUTANEOUS

## 2015-07-22 MED ORDER — HEPARIN SOD (PORK) LOCK FLUSH 100 UNIT/ML IV SOLN
500.0000 [IU] | Freq: Once | INTRAVENOUS | Status: AC
  Administered 2015-07-22: 500 [IU]
  Filled 2015-07-22: qty 5

## 2015-07-22 MED ORDER — DARBEPOETIN ALFA 500 MCG/ML IJ SOSY
PREFILLED_SYRINGE | INTRAMUSCULAR | Status: AC
  Filled 2015-07-22: qty 1

## 2015-07-22 NOTE — Patient Instructions (Signed)
[place] at [facility] Discharge Instructions  RECOMMENDATIONS MADE BY THE CONSULTANT AND ANY TEST RESULTS WILL BE SENT TO YOUR REFERRING PHYSICIAN.  Aranesp 500 mcg injection given as ordered.  Thank you for choosing [place] at [facility] to provide your oncology and hematology care.  To afford each patient quality time with our provider, please arrive at least 15 minutes before your scheduled appointment time.   Beginning January 23rd 2017 lab work for the [name] will be done in the  Main lab at [facility] on 1st floor. If you have a lab appointment with the [facility] please come in thru the  Main Entrance and check in at the main information desk  You need to re-schedule your appointment should you arrive 10 or more minutes late.  We strive to give you quality time with our providers, and arriving late affects you and other patients whose appointments are after yours.  Also, if you no show three or more times for appointments you may be dismissed from the clinic at the providers discretion.     Again, thank you for choosing [facility].  Our hope is that these requests will decrease the amount of time that you wait before being seen by our physicians.       _____________________________________________________________  Should you have questions after your visit to [facility], please contact our office at [phone] between the hours of 8:30 a.m. and 4:30 p.m.  Voicemails left after 4:30 p.m. will not be returned until the following business day.  For prescription refill requests, have your pharmacy contact our office.         Resources For Cancer Patients and their Caregivers ? American Cancer Society: Can assist with transportation, wigs, general needs, runs Look Good Feel Better.        681 057 7906 ? Cancer Care: Provides financial assistance, online support groups, medication/co-pay  assistance.  1-800-813-HOPE 4377268173) ? [place] Assists [place] cancer patients and their families through emotional , educational and financial support.  541-139-1323 ? [place] DSS Where to apply for food stamps, Medicaid and utility assistance. 2083834996 ? [facility]: Transportation to medical appointments. (564) 308-2852 ? Social Security Administration: May apply for disability if have a Stage IV cancer. (770)758-0077 (636)349-6882 ? [employer], Disability and Transit Services: Assists with nutrition, care and transit needs. [phone]

## 2015-07-22 NOTE — Discharge Instructions (Signed)

## 2015-07-22 NOTE — Progress Notes (Signed)
Harry Franklin presents today for injection per MD orders. Aranesp 500 mcg administered SQ in left Abdomen. Administration without incident. Patient tolerated well.

## 2015-07-22 NOTE — ED Provider Notes (Signed)
CSN: [id number]     Arrival date & time 07/22/15  [id number] History   First MD Initiated Contact with Patient 07/22/15 0542     Chief Complaint  Patient presents with  . Tachycardia    Patient is a 75 y.o. male presenting with palpitations. The history is provided by the patient.  Palpitations Palpitations quality:  Regular Onset quality:  Sudden Timing:  Constant Progression:  Resolved Chronicity:  New Relieved by:  None tried Worsened by:  Nothing Associated symptoms: no chest pain, no cough, no shortness of breath, no syncope and no vomiting   pt reports he woke up to go to bathroom at around 330am He reports he noted his HR was 109 on his fit bit He felt palpitations He reports his "Chest felt funny" but no CP No syncope No fever/vomiting No cough No pleuritic CP No SOB This resolved around 4am He went to be feeling well  He has h/o metastatic bladder CA.  He is currently on chemotherapy, last treatment yesterday  Past Medical History  Diagnosis Date  . GERD (gastroesophageal reflux disease)   . Hypokalemia   . Hypertension   . Cataract   . Arthritis   . Macular degeneration, age related   . DVT (deep venous thrombosis) ([place])     developed after traveling x 1, developed another after left foot surgery  . Hydrocele in adult   . 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 Replacement total knee    . Appendectomy    . Hammer toe surgery Left   . Lapidus procedure    . Hallux fusion    . Colonoscopy  03/31/05  . Colonoscopy N/A 10/02/2014    [id number] diverticulosis  . Esophagogastroduodenoscopy N/A 10/02/2014    [id number] ulcerative reflux/s/p dilation/large HH  . Maloney dilation N/A 10/02/2014    Procedure: Venia Minks DILATION;  Surgeon: Daneil Dolin, MD;  Location[facility];  Service: Endoscopy;  Laterality: N/A;  . Esophagogastroduodenoscopy N/A 01/29/2015    Procedure: ESOPHAGOGASTRODUODENOSCOPY  (EGD);  Surgeon: Daneil Dolin, MD;  Location: [facility] ENDO SU[facility];  Service: Endoscopy;  Laterality: N/A;  0800-rescheduled to 9/9 @ 915 [place] notified pt  . Esophageal dilation  01/29/2015    Procedure: ESOPHAGEAL DILATION;  Surgeon: Daneil Dolin, MD;  Location: [facility]O [facility];  Service: Endoscopy;;  . Portacath placement Left 04/13/15   Family History  Problem Relation Age of Onset  . Colon cancer Maternal Uncle   . Heart disease Father    Social History  Substance Use Topics  . Smoking status: Former Smoker -- 1.00 packs/day for 31 years    Types: Cigarettes    Quit date: 01/22/1984  . Smokeless tobacco: Never Used     Comment: qUIT IN 1985  . Alcohol Use: No    Review of Systems  Constitutional: Negative for fever.  Respiratory: Negative for cough and shortness of breath.   Cardiovascular: Positive for palpitations. Negative for chest pain and syncope.  Gastrointestinal: Negative for vomiting.  Neurological: Negative for syncope.  All other systems reviewed and are negative.     Allergies  Rabeprazole and Vancomycin  Home Medications   Prior to Admission medications   Medication Sig Start Date End Date Taking? Authorizing Provider  aspirin 81 MG tablet Take 81 mg by mouth daily. Take 2 tabs (162 mg total) daily   Yes Historical Provider, MD  atorvastatin (LIPITOR) 80 MG tablet Take 1 tablet (80  mg total) by mouth daily at 6 PM. 06/25/15  Yes Lendon Colonel, NP  carvedilol (COREG) 6.25 MG tablet Take 1 & 1/2 tablets two times daily 06/25/15  Yes Lendon Colonel, NP  lisinopril (PRINIVIL,ZESTRIL) 2.5 MG tablet Take 1 tablet (2.5 mg total) by mouth daily. 06/25/15  Yes Lendon Colonel, NP  Multiple Vitamins-Minerals (PRESERVISION AREDS 2 PO) Take 1 tablet by mouth 2 (two) times daily.   Yes Historical Provider, MD  omeprazole (PRILOSEC) 40 MG capsule Take 40 mg by mouth daily.    Yes Historical Provider, MD  ondansetron (ZOFRAN) 8 MG tablet Take 1 tablet (8 mg total) by  mouth every 8 (eight) hours as needed for nausea or vomiting. 04/12/15  Yes Patrici Ranks, MD   BP 118/69 mmHg  Pulse 90  Temp(Src) 97.8 F (36.6 C) (Oral)  Resp 18  Ht 6\' 4"  (1.93 m)  Wt 127.914 kg  BMI 34.34 kg/m2  SpO2 98% Physical Exam CONSTITUTIONAL: Well developed/well nourished HEAD: Normocephalic/atraumatic EYES: EOMI/PERRL ENMT: Mucous membranes moist NECK: supple no meningeal signs SPINE/BACK:entire spine nontender CV: S1/S2 noted, no murmurs/rubs/gallops noted LUNGS: Lungs are clear to auscultation bilaterally, no apparent distress ABDOMEN: soft, nontender NEURO: Pt is awake/alert/appropriate, moves all extremitiesx4.  No facial droop.   EXTREMITIES: pulses normal/equal, full ROM SKIN: warm, color normal PSYCH: no abnormalities of mood noted, alert and oriented to situation  ED Course  Procedures   6:24 AM Pt with h/o metastatic bladder CA He had chemo yesterday and had cbc/bmp performed yesterday He is asymptomatic at this time He did have recent admission for afib and Non-stemi - due to co-morbidities and his thrombocytopenia he is not candidate for invasive cardiovascular procedure His EKG is in sinus rhythm Will order one troponin to ensure it is getting back to baseline Otherwise pt will be discharged homed  7:13 AM Pt feels well No recurrent palpitations No CP Pt would like to go home  Labs Review Labs Reviewed  TROPONIN I    I have personally reviewed and evaluated these lab results as part of my medical decision-making.   EKG Interpretation   Date/Time:  Thursday July 22 2015 05:24:34 EST Ventricular Rate:  90 PR Interval:  169 QRS Duration: 111 QT Interval:  390 QTC Calculation: 477 R Axis:   64 Text Interpretation:  Sinus rhythm Abnormal R-wave progression, early  transition Borderline repolarization abnormality Borderline prolonged QT  interval Baseline wander in lead(s) V5 V6 No significant change since last  tracing  Confirmed by Christy Gentles  MD, Elenore Rota ([id number]) on 07/22/2015 5:37:52 AM      MDM   Final diagnoses:  Palpitations    Nursing notes including past medical history and social history reviewed and considered in documentation Previous records reviewed and considered      Ripley Fraise, MD 07/22/15 [phone]

## 2015-07-22 NOTE — ED Notes (Signed)
Pt states he awoke approx 0300 to use the restroom when he felt like his heart was racing, states he got a reading of 109 on his fitbit.  Pt states his "chest felt a little funny"  But denies pain or discomfort at this time.

## 2015-07-23 ENCOUNTER — Ambulatory Visit: Payer: Medicare HMO | Admitting: Adult Health

## 2015-07-23 ENCOUNTER — Ambulatory Visit (HOSPITAL_COMMUNITY): Payer: Medicare HMO

## 2015-07-23 ENCOUNTER — Telehealth (HOSPITAL_COMMUNITY): Payer: Self-pay | Admitting: *Deleted

## 2015-07-23 NOTE — Telephone Encounter (Signed)
Pt called and left a message stating that he has chills. His temp was 99.1. Pt took Tylenol 2 tabs. Pt still has chills but hasn't rechecked temperature. He states that he has been drinking cold drink. Pt can't remember if he has had chills in the past after chemo but does state that the 2nd day is when he tends to have problems. Pt states that he has always gotten a low grade temp of 99 after chemo. Titus Dubin was the first day that he received Aranesp. Patient is having muscle aches/body aches also. No green or yellow drainage/mucus. No cough. No sinus issues but does have a runny nose that he thinks is clear. No problems with urinating. Left foot is swollen some so pt took a fluid pill this morning. Pt has a screw in the left toe of left foot. Pt states that the left leg swelling is not abnormal for him.

## 2015-07-26 ENCOUNTER — Telehealth (HOSPITAL_COMMUNITY): Payer: Self-pay | Admitting: *Deleted

## 2015-07-26 NOTE — Telephone Encounter (Signed)
Patient had bad days on Friday an Saturday. Chills went away on Saturday. Temp on Saturday was 99.1 to the best of his recollection. Muscle aches went away on Saturday also. Patient states that he survived. Today, Monday, 07/26/15, pt states he is better. Pt states that he went to the ER on 07/22/15 and they accessed his port and left a red place on his port area and has 2 red dots also in that area. He states that he is putting cortisone cream on it - he states that he doesn't know if it is a rash. He states that there is a scab-like area to that area. Area is not any worse looking than it was the other day.

## 2015-07-26 NOTE — Telephone Encounter (Signed)
Addendum to previous note: In addition to the previous note, we will assess patient's port site when he returns on Wednesday 07/28/15 to see [place]. Pt to call us if he has any problems or trouble.

## 2015-07-28 ENCOUNTER — Encounter (HOSPITAL_BASED_OUTPATIENT_CLINIC_OR_DEPARTMENT_OTHER): Payer: Medicare HMO | Admitting: Hematology & Oncology

## 2015-07-28 ENCOUNTER — Encounter (HOSPITAL_COMMUNITY): Payer: Self-pay | Admitting: Hematology & Oncology

## 2015-07-28 ENCOUNTER — Inpatient Hospital Stay (HOSPITAL_COMMUNITY): Payer: Medicare HMO

## 2015-07-28 ENCOUNTER — Encounter (HOSPITAL_BASED_OUTPATIENT_CLINIC_OR_DEPARTMENT_OTHER): Payer: Medicare HMO

## 2015-07-28 VITALS — BP 138/66 | HR 73 | Temp 98.0°F | Resp 18 | Wt 282.2 lb

## 2015-07-28 DIAGNOSIS — C7910 Secondary malignant neoplasm of unspecified urinary organs: Secondary | ICD-10-CM

## 2015-07-28 DIAGNOSIS — D61818 Other pancytopenia: Secondary | ICD-10-CM

## 2015-07-28 DIAGNOSIS — K219 Gastro-esophageal reflux disease without esophagitis: Secondary | ICD-10-CM

## 2015-07-28 DIAGNOSIS — C649 Malignant neoplasm of unspecified kidney, except renal pelvis: Secondary | ICD-10-CM

## 2015-07-28 DIAGNOSIS — C787 Secondary malignant neoplasm of liver and intrahepatic bile duct: Secondary | ICD-10-CM

## 2015-07-28 DIAGNOSIS — T451X5A Adverse effect of antineoplastic and immunosuppressive drugs, initial encounter: Secondary | ICD-10-CM

## 2015-07-28 DIAGNOSIS — D6481 Anemia due to antineoplastic chemotherapy: Secondary | ICD-10-CM | POA: Diagnosis not present

## 2015-07-28 DIAGNOSIS — Z5189 Encounter for other specified aftercare: Secondary | ICD-10-CM

## 2015-07-28 DIAGNOSIS — C7971 Secondary malignant neoplasm of right adrenal gland: Secondary | ICD-10-CM

## 2015-07-28 DIAGNOSIS — I4891 Unspecified atrial fibrillation: Secondary | ICD-10-CM

## 2015-07-28 DIAGNOSIS — D61810 Antineoplastic chemotherapy induced pancytopenia: Secondary | ICD-10-CM

## 2015-07-28 DIAGNOSIS — Z5111 Encounter for antineoplastic chemotherapy: Secondary | ICD-10-CM

## 2015-07-28 DIAGNOSIS — I214 Non-ST elevation (NSTEMI) myocardial infarction: Secondary | ICD-10-CM

## 2015-07-28 DIAGNOSIS — C7972 Secondary malignant neoplasm of left adrenal gland: Secondary | ICD-10-CM

## 2015-07-28 DIAGNOSIS — R079 Chest pain, unspecified: Secondary | ICD-10-CM

## 2015-07-28 LAB — CBC WITH DIFFERENTIAL/PLATELET
Basophils Absolute: 0 10*3/uL (ref 0.0–0.1)
Basophils Relative: 1 %
EOS ABS: 0 10*3/uL (ref 0.0–0.7)
Eosinophils Relative: 3 %
HCT: 26.9 % — ABNORMAL LOW (ref 39.0–52.0)
HEMOGLOBIN: 8.7 g/dL — AB (ref 13.0–17.0)
LYMPHS ABS: 0.5 10*3/uL — AB (ref 0.7–4.0)
LYMPHS PCT: 42 %
MCH: 32.5 pg (ref 26.0–34.0)
MCHC: 32.3 g/dL (ref 30.0–36.0)
MCV: 100.4 fL — AB (ref 78.0–100.0)
Monocytes Absolute: 0.1 10*3/uL (ref 0.1–1.0)
Monocytes Relative: 7 %
NEUTROS PCT: 47 %
Neutro Abs: 0.6 10*3/uL — ABNORMAL LOW (ref 1.7–7.7)
Platelets: 61 10*3/uL — ABNORMAL LOW (ref 150–400)
RBC: 2.68 MIL/uL — AB (ref 4.22–5.81)
RDW: 18.8 % — ABNORMAL HIGH (ref 11.5–15.5)
WBC: 1.3 10*3/uL — AB (ref 4.0–10.5)

## 2015-07-28 LAB — COMPREHENSIVE METABOLIC PANEL
ALK PHOS: 110 U/L (ref 38–126)
ALT: 48 U/L (ref 17–63)
AST: 55 U/L — ABNORMAL HIGH (ref 15–41)
Albumin: 3 g/dL — ABNORMAL LOW (ref 3.5–5.0)
Anion gap: 4 — ABNORMAL LOW (ref 5–15)
BUN: 30 mg/dL — ABNORMAL HIGH (ref 6–20)
CALCIUM: 8.1 mg/dL — AB (ref 8.9–10.3)
CO2: 26 mmol/L (ref 22–32)
CREATININE: 0.99 mg/dL (ref 0.61–1.24)
Chloride: 106 mmol/L (ref 101–111)
GFR calc non Af Amer: >60 mL/min (ref >60)
Glucose, Bld: 105 mg/dL — ABNORMAL HIGH (ref 65–99)
Potassium: 4.2 mmol/L (ref 3.5–5.1)
SODIUM: 136 mmol/L (ref 135–145)
Total Bilirubin: 0.8 mg/dL (ref 0.3–1.2)
Total Protein: 5.9 g/dL — ABNORMAL LOW (ref 6.5–8.1)

## 2015-07-28 LAB — PREPARE RBC (CROSSMATCH)

## 2015-07-28 MED ORDER — DIPHENHYDRAMINE HCL 25 MG PO CAPS
25.0000 mg | ORAL_CAPSULE | Freq: Once | ORAL | Status: AC
  Administered 2015-07-28: 25 mg via ORAL

## 2015-07-28 MED ORDER — DIPHENHYDRAMINE HCL 25 MG PO CAPS
ORAL_CAPSULE | ORAL | Status: AC
  Filled 2015-07-28: qty 1

## 2015-07-28 MED ORDER — HEPARIN SOD (PORK) LOCK FLUSH 100 UNIT/ML IV SOLN
500.0000 [IU] | Freq: Every day | INTRAVENOUS | Status: AC | PRN
  Administered 2015-07-28: 500 [IU]

## 2015-07-28 MED ORDER — ACETAMINOPHEN 325 MG PO TABS
650.0000 mg | ORAL_TABLET | Freq: Once | ORAL | Status: AC
  Administered 2015-07-28: 650 mg via ORAL

## 2015-07-28 MED ORDER — PEGFILGRASTIM INJECTION 6 MG/0.6ML ~~LOC~~
6.0000 mg | PREFILLED_SYRINGE | Freq: Once | SUBCUTANEOUS | Status: AC
  Administered 2015-07-28: 6 mg via SUBCUTANEOUS
  Filled 2015-07-28: qty 0.6

## 2015-07-28 MED ORDER — SODIUM CHLORIDE 0.9 % IV SOLN
250.0000 mL | Freq: Once | INTRAVENOUS | Status: AC
  Administered 2015-07-28: 250 mL via INTRAVENOUS

## 2015-07-28 MED ORDER — SODIUM CHLORIDE 0.9% FLUSH
10.0000 mL | INTRAVENOUS | Status: AC | PRN
  Administered 2015-07-28: 10 mL

## 2015-07-28 MED ORDER — ACETAMINOPHEN 325 MG PO TABS
ORAL_TABLET | ORAL | Status: AC
  Filled 2015-07-28: qty 2

## 2015-07-28 NOTE — Progress Notes (Signed)
Harry Inches, MD [place] [address]  Metastatic urothelial carcinoma [facility]) - Plan: pegfilgrastim (NEULASTA) injection 6 mg, CBC with Differential, Practitioner attestation of consent, Complete patient signature process for consent form, Care order/instruction, 0.9 %  sodium chloride infusion, Type and screen, Prepare RBC, Prepare RBC, CT Abdomen W Contrast, CT Chest W Contrast, Type and screen, Practitioner attestation of consent, Complete patient signature process for consent form, Care order/instruction, DISCONTINUED: sodium chloride flush (NS) 0.9 % injection 10 mL, DISCONTINUED: heparin lock flush 100 unit/mL, CANCELED: Transfuse RBC, DISCONTINUED: diphenhydrAMINE (BENADRYL) capsule 25 mg, DISCONTINUED: acetaminophen (TYLENOL) tablet 650 mg  CURRENT THERAPY: Carboplatin/Gemcitabine in a day 1, 8 every 21 day fashion  INTERVAL HISTORY: Harry Franklin 75 y.o. 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    Metastatic urothelial carcinoma ([place])   04/02/2015 Imaging CT abd/pelvis- Innumberable hepatic masses, B/L adrenal mets, abdominopelvic lymphadenopathy, LLL lung lesion 6 mm, Lytic lesion of right L2 vertebral bosy, circumferential wall thickening of lower thoracic esophagus, mild diffuse bladder wall thickening.   04/02/2015 Pathology Results Urine Cytology- cells present suspicious for malignancy.  Suspicious for high-grade urothelial carcinoma.   04/08/2015 Procedure US guided biopsy of hepatic lesion   04/08/2015 Pathology Results Liver, needle/core biopsy, right - METASTATIC HIGH GRADE CARCINOMA,   04/14/2015 -  Chemotherapy Carboplatin/Gemcitabine Days 1, 8 every 21 days   05/25/2015 Imaging CT with interval response to therapy, improvement in multifocal liver mets, improvement in enlarged abdominal and pelvic adenopahty and bilateral adrenal gland  metastatese, stable appearance of lytic lesion in L2   06/02/2015 Miscellaneous Xgeva to reduce the risk of SLE.  120 mg SQ every month.   06/08/2015 - 06/11/2015 Hospital Admission NSTEMI, Afib with RVR, Hypertension, Chemotherapy induced pancyopenia   06/28/2015 Treatment Plan Change Carboplatin dose reduced to AUC of 4 and Gemcitabine dose reduced by 20%    Harry Franklin is accompanied by two family members. He is scheduled for day 8, cycle #5 Carboplatin/Gemcitabine today.  The last week has been kind of rough. Stating that Fridays are usually bad without any get up and go. This past Friday, he had chills with a temperature of 99.9 F. He took tylenol which decreased his temperature to 99.1 F but he was still having chills. Typically by Saturday he is feeling better, however this past Saturday he still felt fatigued and had to make himself do things. Monday was good, though his port was giving him trouble - he attributes this to the tape they used in the ER breaking him out.    He went to the ER on 3/2 when he felt as though his blood pressure was high. He was not having chest pain, but rather "heart fluttering". An EKG and blood work were performed but nothing was found to be abnormal.   He does not believe that his mood is keeping him down. He stays optimistic.  He has been eating well, though on Friday while he was feeling bad he did not eat as much.   He experienced shortness of breath walking over to his neighbor's house recently. Notes he was able to get there without stopping, however on the return trip he had to stop several times due to shortness of breath.    Denies chest pain, no current chills, fever, nausea or vomiting. No diarrhea or constipation.    Past Medical History  Diagnosis  Date  . GERD (gastroesophageal reflux disease)   . Hypokalemia   . Hypertension   . Cataract   . Arthritis   . Macular degeneration, age related   . DVT (deep venous thrombosis) (HCC)      developed after traveling x 1, developed another after left foot surgery  . Hydrocele in adult   . Metastatic urothelial carcinoma (HCC) 04/12/2015    has Dysphagia; Encounter for screening colonoscopy; Dysphagia, pharyngoesophageal phase; Reflux esophagitis; Peptic stricture of esophagus; Hiatal hernia; Abdominal pain; Metastatic urothelial carcinoma (HCC); Dyspnea; Elevated troponin; Atrial fibrillation with rapid ventricular response (HCC); Antineoplastic chemotherapy induced pancytopenia (HCC); NSTEMI (non-ST elevated myocardial infarction) (HCC); and Recurrent falls on his problem list.     is allergic to neulasta; rabeprazole; and vancomycin.  Current Outpatient Prescriptions on File Prior to Visit  Medication Sig Dispense Refill  . atorvastatin (LIPITOR) 80 MG tablet Take 1 tablet (80 mg total) by mouth daily at 6 PM. 90 tablet 3  . carvedilol (COREG) 6.25 MG tablet Take 1 & 1/2 tablets two times daily 135 tablet 3  . lisinopril (PRINIVIL,ZESTRIL) 2.5 MG tablet Take 1 tablet (2.5 mg total) by mouth daily. 90 tablet 3  . Multiple Vitamins-Minerals (PRESERVISION AREDS 2 PO) Take 1 tablet by mouth 2 (two) times daily.    Marland Kitchen omeprazole (PRILOSEC) 40 MG capsule Take 40 mg by mouth daily.     . ondansetron (ZOFRAN) 8 MG tablet Take 1 tablet (8 mg total) by mouth every 8 (eight) hours as needed for nausea or vomiting. 30 tablet 2   No current facility-administered medications on file prior to visit.    Past Surgical History  Procedure Laterality Date  . Replacement total knee    . Appendectomy    . Hammer toe surgery Left   . Lapidus procedure    . Hallux fusion    . Colonoscopy  03/31/05  . Colonoscopy N/A 10/02/2014    EPP:IRJJOAC diverticulosis  . Esophagogastroduodenoscopy N/A 10/02/2014    ZYS:AYTKZ ulcerative reflux/s/p dilation/large HH  . Maloney dilation N/A 10/02/2014    Procedure: Elease Hashimoto DILATION;  Surgeon: Corbin Ade, MD;  [facility];  Service: Endoscopy;   Laterality: N/A;  . Esophagogastroduodenoscopy N/A 01/29/2015    Procedure: ESOPHAGOGASTRODUODENOSCOPY (EGD);  Surgeon: Corbin Ade, MD;  Location: [facility];  Service: Endoscopy;  Laterality: N/A;  0800-rescheduled to 9/9 @ 915 [place]andy notified pt  . Esophageal dilation  01/29/2015    Procedure: ESOPHAGEAL DILATION;  Surgeon: Corbin Ade, MD;  Location: [facility];  Service: Endoscopy;;  . Portacath placement Left 04/13/15    REVIEW OF SYSTEMS  Positive for lower abdominal pain. Positive for shortness of breath. Denies any headaches, dizziness, double vision, fevers, chills, night sweats, nausea, vomiting, diarrhea, constipation, chest pain, heart palpitations, shortness of breath, blood in stool, black tarry stool, urinary pain, urinary burning, urinary frequency, hematuria.  14 point review of systems was performed and is negative except as detailed under history of present illness and above  PHYSICAL EXAMINATION  ECOG PERFORMANCE STATUS: 0 - Asymptomatic   Vitals with BMI 07/28/2015  Height   Weight 282 lbs 3 oz  BMI   Systolic 138  Diastolic 66  Pulse 73  Respirations 18   GENERAL:alert, no distress, well nourished, well developed, comfortable, cooperative, smiling and accompanied by family. SKIN: skin color, texture, turgor are normal, no rashes or significant lesions HEAD: Normocephalic, No masses, lesions, tenderness or abnormalities EYES: normal, EOMI, Conjunctiva are  pink and non-injected EARS: External ears normal OROPHARYNX:lips, buccal mucosa, and tongue normal and mucous membranes are moist  NECK: supple, no stridor, trachea midline LYMPH:  no palpable lymphadenopathy, no hepatosplenomegaly BREAST:not examined LUNGS: clear to auscultation and percussion HEART: regular rate & rhythm ABDOMEN:abdomen soft, non-tender, normal bowel sounds and no masses or organomegaly BACK: Back symmetric, no curvature. EXTREMITIES:less then 2 second capillary refill, no  edema, no skin discoloration, no clubbing, no cyanosis. Right leg larger than left, chronic from prior right knee surgery. NEURO: alert & oriented x 3 with fluent speech, no focal motor/sensory deficits, gait normal   LABORATORY DATA: I have reviewed the data as listed   Results for Harry, Franklin (MRN [id number]) as of 07/28/2015 10:38  Ref. Range 07/28/2015 09:55  Sodium Latest Ref Range: 135-145 mmol/L 136  Potassium Latest Ref Range: 3.5-5.1 mmol/L 4.2  Chloride Latest Ref Range: 101-111 mmol/L 106  CO2 Latest Ref Range: 22-32 mmol/L 26  BUN Latest Ref Range: 6-20 mg/dL 30 (H)  Creatinine Latest Ref Range: 0.61-1.24 mg/dL 0.99  Calcium Latest Ref Range: 8.9-10.3 mg/dL 8.1 (L)  EGFR (Non-African Amer.) Latest Ref Range: >60 mL/min >60  EGFR (African American) Latest Ref Range: >60 mL/min >60  Glucose Latest Ref Range: 65-99 mg/dL 105 (H)  Anion gap Latest Ref Range: 5-15  4 (L)  Alkaline Phosphatase Latest Ref Range: 38-126 U/L 110  Albumin Latest Ref Range: 3.5-5.0 g/dL 3.0 (L)  AST Latest Ref Range: 15-41 U/L 55 (H)  ALT Latest Ref Range: 17-63 U/L 48  Total Protein Latest Ref Range: 6.5-8.1 g/dL 5.9 (L)  Total Bilirubin Latest Ref Range: 0.3-1.2 mg/dL 0.8  WBC Latest Ref Range: 4.0-10.5 K/uL 1.3 (LL)  RBC Latest Ref Range: 4.22-5.81 MIL/uL 2.68 (L)  Hemoglobin Latest Ref Range: 13.0-17.0 g/dL 8.7 (L)  HCT Latest Ref Range: 39.0-52.0 % 26.9 (L)  MCV Latest Ref Range: 78.0-100.0 fL 100.4 (H)  MCH Latest Ref Range: 26.0-34.0 pg 32.5  MCHC Latest Ref Range: 30.0-36.0 g/dL 32.3  RDW Latest Ref Range: 11.5-15.5 % 18.8 (H)  Platelets Latest Ref Range: 150-400 K/uL 61 (L)  Neutrophils Latest Units: % 47  Lymphocytes Latest Units: % 42  Monocytes Relative Latest Units: % 7  Eosinophil Latest Units: % 3  Basophil Latest Units: % 1  NEUT# Latest Ref Range: 1.7-7.7 K/uL 0.6 (L)  Lymphocyte # Latest Ref Range: 0.7-4.0 K/uL 0.5 (L)  Monocyte # Latest Ref Range: 0.1-1.0 K/uL 0.1    Eosinophils Absolute Latest Ref Range: 0.0-0.7 K/uL 0.0  Basophils Absolute Latest Ref Range: 0.0-0.1 K/uL 0.0     RADIOGRAPHIC STUDIES: I have personally reviewed the radiological images as listed and agreed with the findings in the report. NM Myocar Multi W/Spect W/Wall Motion / EF   Status: Final result       PACS Images     Show images for NM Myocar Multi W/Spect W/Wall Motion / EF     Study Result      There was no ST segment deviation noted during stress.  Findings consistent with prior myocardial infarction with peri-infarct ischemia in the apex and in the septal/inferoseptal/inferior wall territories. Both defects are primarily scar with fairly mild peri-infarct ischemia.  This is a high risk study. High risk based on low ejection fraction and multiple defects. There is a fairly minimal amount of myocardium currently at jeopardy.  The left ventricular ejection fraction is moderately decreased (30-44%).     ASSESSMENT AND PLAN: Stage IV urothelial carcinoma Liver Metastases  Adrenal Metastases  Chest pain/GERD History of esophageal stricture Carboplatin/Gemzar with excellent tolerance and response Anemia, treatment related NSTEMI.  Atrial fibrillation with RVR CARBO/GEMZAR dose reduction secondary to above and pancytopenia  The patient is scheduled for day 8, cycle #5 Carboplatin/Gemcitabine today. Based on today's CBC results, we will hold chemotherapy treatment today. I advised the patient that I recommend we consider repeating imaging and pending results consider taking a break from therapy. He is agreeable.  I have arranged for a transfusion of PRBC's based upon his SOB and recent MI. His chemotherapy has already been dose reduced.   I have ordered repeat imaging studies for next week with follow up afterwards to discuss these results. We again reviewed our goals of care which principally include quality of life.   All questions were answered. The patient  knows to call the clinic with any problems, questions or concerns. We can certainly see the patient much sooner if necessary.  This document serves as a record of services personally performed by Ancil Linsey, MD. It was created on her behalf by Arlyce Harman, a trained medical scribe. The creation of this record is based on the scribe's personal observations and the provider's statements to them. This document has been checked and approved by the attending provider.  I have reviewed the above documentation for accuracy and completeness, and I agree with the above.  This note is electronically signed by: Molli Hazard, MD  07/29/2015 6:17 PM

## 2015-07-28 NOTE — Progress Notes (Signed)
1610:  Tolerated transfusion w/o adverse reaction.  VSS.  Discharged ambulatory in c/o family for transport home.

## 2015-07-28 NOTE — Patient Instructions (Signed)
..  [place] at [facility] Discharge Instructions  RECOMMENDATIONS MADE BY THE CONSULTANT AND ANY TEST RESULTS WILL BE SENT TO YOUR REFERRING PHYSICIAN.  We have other options if counts prevent [place] from treatment - immunotherapy and or giving you a break if scans are better    Thank you for choosing [place] at [facility] to provide your oncology and hematology care.  To afford each patient quality time with our provider, please arrive at least 15 minutes before your scheduled appointment time.   Beginning January 23rd 2017 lab work for the [name] will be done in the  Main lab at [facility] on 1st floor. If you have a lab appointment with the [place] please come in thru the  Main Entrance and check in at the main information desk  You need to re-schedule your appointment should you arrive 10 or more minutes late.  We strive to give you quality time with our providers, and arriving late affects you and other patients whose appointments are after yours.  Also, if you no show three or more times for appointments you may be dismissed from the clinic at the providers discretion.     Again, thank you for choosing [facility].  Our hope is that these requests will decrease the amount of time that you wait before being seen by our physicians.       _____________________________________________________________  Should you have questions after your visit to [facility], please contact our office at [phone] between the hours of 8:30 a.m. and 4:30 p.m.  Voicemails left after 4:30 p.m. will not be returned until the following business day.  For prescription refill requests, have your pharmacy contact our office.         Resources For Cancer Patients and their Caregivers ? American Cancer Society: Can assist with transportation, wigs, general needs, runs Look Good Feel Better.         [phone] ? Cancer Care: Provides financial assistance, online support groups, medication/co-pay assistance.  1-800-813-HOPE 956-760-8601) ? [place] Assists [place] cancer patients and their families through emotional , educational and financial support.  858-204-3753 ? [place] DSS Where to apply for food stamps, Medicaid and utility assistance. 313-474-3341 ? [employer]: Transportation to medical appointments. 406-396-2465 ? Social Security Administration: May apply for disability if have a Stage IV cancer. 435-552-3549 402-799-0579 ? [employer], Disability and Transit Services: Assists with nutrition, care and transit needs. 7738057710

## 2015-07-28 NOTE — Patient Instructions (Signed)
[place] at [facility] Discharge Instructions  RECOMMENDATIONS MADE BY THE CONSULTANT AND ANY TEST RESULTS WILL BE SENT TO YOUR REFERRING PHYSICIAN.  Today you received 2 units of blood. You also received a Neulasta injection, which will help your body produce blood cells to fight infection. Return as scheduled for lab work and CT scans. Call the clinic if you have any questions or concerns.   Thank you for choosing [place] at [facility] to provide your oncology and hematology care.  To afford each patient quality time with our provider, please arrive at least 15 minutes before your scheduled appointment time.   Beginning January 23rd 2017 lab work for the [name] will be done in the  Main lab at [facility] on 1st floor. If you have a lab appointment with the [place] please come in thru the  Main Entrance and check in at the main information desk  You need to re-schedule your appointment should you arrive 10 or more minutes late.  We strive to give you quality time with our providers, and arriving late affects you and other patients whose appointments are after yours.  Also, if you no show three or more times for appointments you may be dismissed from the clinic at the providers discretion.     Again, thank you for choosing [facility].  Our hope is that these requests will decrease the amount of time that you wait before being seen by our physicians.       _____________________________________________________________  Should you have questions after your visit to [facility], please contact our office at [phone] between the hours of 8:30 a.m. and 4:30 p.m.  Voicemails left after 4:30 p.m. will not be returned until the following business day.  For prescription refill requests, have your pharmacy contact our office.         Resources For Cancer Patients and their Caregivers ? American Cancer  Society: Can assist with transportation, wigs, general needs, runs Look Good Feel Better.        (365)431-7603 ? Cancer Care: Provides financial assistance, online support groups, medication/co-pay assistance.  1-800-813-HOPE 661-184-7933) ? [place] Assists [place] cancer patients and their families through emotional , educational and financial support.  843-376-5082 ? [place] DSS Where to apply for food stamps, Medicaid and utility assistance. (878)812-5758 ? RCATS: Transportation to medical appointments. 367-093-7183 ? Social Security Administration: May apply for disability if have a Stage IV cancer. [phone] ? [employer], Disability and Transit Services: Assists with nutrition, care and transit needs. 219-284-1425

## 2015-07-29 ENCOUNTER — Emergency Department (HOSPITAL_COMMUNITY): Payer: Medicare HMO

## 2015-07-29 ENCOUNTER — Emergency Department (HOSPITAL_COMMUNITY)
Admission: EM | Admit: 2015-07-29 | Discharge: 2015-07-29 | Disposition: A | Discharge status: Home / Self Care | Payer: Medicare HMO | Attending: Emergency Medicine | Admitting: Emergency Medicine

## 2015-07-29 ENCOUNTER — Encounter (HOSPITAL_COMMUNITY): Payer: Self-pay | Admitting: Emergency Medicine

## 2015-07-29 ENCOUNTER — Encounter (HOSPITAL_COMMUNITY): Payer: Medicare HMO

## 2015-07-29 ENCOUNTER — Telehealth (HOSPITAL_COMMUNITY): Payer: Self-pay | Admitting: *Deleted

## 2015-07-29 DIAGNOSIS — Z79811 Long term (current) use of aromatase inhibitors: Secondary | ICD-10-CM | POA: Diagnosis not present

## 2015-07-29 DIAGNOSIS — I10 Essential (primary) hypertension: Secondary | ICD-10-CM | POA: Insufficient documentation

## 2015-07-29 DIAGNOSIS — R0602 Shortness of breath: Secondary | ICD-10-CM | POA: Diagnosis present

## 2015-07-29 DIAGNOSIS — T7840XA Allergy, unspecified, initial encounter: Secondary | ICD-10-CM

## 2015-07-29 DIAGNOSIS — Z79899 Other long term (current) drug therapy: Secondary | ICD-10-CM | POA: Diagnosis not present

## 2015-07-29 DIAGNOSIS — Z87891 Personal history of nicotine dependence: Secondary | ICD-10-CM | POA: Insufficient documentation

## 2015-07-29 DIAGNOSIS — L500 Allergic urticaria: Secondary | ICD-10-CM | POA: Diagnosis not present

## 2015-07-29 DIAGNOSIS — E876 Hypokalemia: Secondary | ICD-10-CM | POA: Diagnosis not present

## 2015-07-29 DIAGNOSIS — Z7982 Long term (current) use of aspirin: Secondary | ICD-10-CM | POA: Insufficient documentation

## 2015-07-29 DIAGNOSIS — L509 Urticaria, unspecified: Secondary | ICD-10-CM

## 2015-07-29 LAB — CBC WITH DIFFERENTIAL/PLATELET
BASOS ABS: 0 10*3/uL (ref 0.0–0.1)
BASOS PCT: 0 %
EOS ABS: 0 10*3/uL (ref 0.0–0.7)
Eosinophils Relative: 0 %
HEMATOCRIT: 36.9 % — AB (ref 39.0–52.0)
HEMOGLOBIN: 12.3 g/dL — AB (ref 13.0–17.0)
LYMPHS PCT: 7 %
Lymphs Abs: 0.6 10*3/uL — ABNORMAL LOW (ref 0.7–4.0)
MCH: 32.1 pg (ref 26.0–34.0)
MCHC: 33.3 g/dL (ref 30.0–36.0)
MCV: 96.3 fL (ref 78.0–100.0)
MONO ABS: 0.3 10*3/uL (ref 0.1–1.0)
Monocytes Relative: 4 %
NEUTROS ABS: 7.8 10*3/uL — AB (ref 1.7–7.7)
NEUTROS PCT: 89 %
Platelets: 49 10*3/uL — ABNORMAL LOW (ref 150–400)
RBC: 3.83 MIL/uL — ABNORMAL LOW (ref 4.22–5.81)
RDW: 19.1 % — AB (ref 11.5–15.5)
WBC: 8.7 10*3/uL (ref 4.0–10.5)

## 2015-07-29 LAB — TYPE AND SCREEN
ABO/RH(D): A NEG
Antibody Screen: NEGATIVE
UNIT DIVISION: 0
Unit division: 0

## 2015-07-29 LAB — COMPREHENSIVE METABOLIC PANEL
ALK PHOS: 112 U/L (ref 38–126)
ALT: 43 U/L (ref 17–63)
ANION GAP: 6 (ref 5–15)
AST: 51 U/L — ABNORMAL HIGH (ref 15–41)
Albumin: 2.9 g/dL — ABNORMAL LOW (ref 3.5–5.0)
BILIRUBIN TOTAL: 1.1 mg/dL (ref 0.3–1.2)
BUN: 28 mg/dL — ABNORMAL HIGH (ref 6–20)
CALCIUM: 8.3 mg/dL — AB (ref 8.9–10.3)
CO2: 23 mmol/L (ref 22–32)
Chloride: 107 mmol/L (ref 101–111)
Creatinine, Ser: 1.09 mg/dL (ref 0.61–1.24)
Glucose, Bld: 150 mg/dL — ABNORMAL HIGH (ref 65–99)
Potassium: 4.4 mmol/L (ref 3.5–5.1)
Sodium: 136 mmol/L (ref 135–145)
TOTAL PROTEIN: 5.9 g/dL — AB (ref 6.5–8.1)

## 2015-07-29 MED ORDER — PREDNISONE 20 MG PO TABS: 40.0000 mg | ORAL_TABLET | Freq: Every day | ORAL | Status: DC

## 2015-07-29 MED ORDER — FAMOTIDINE IN NACL 20-0.9 MG/50ML-% IV SOLN
20.0000 mg | Freq: Once | INTRAVENOUS | Status: AC
  Administered 2015-07-29: 20 mg via INTRAVENOUS
  Filled 2015-07-29: qty 50

## 2015-07-29 MED ORDER — METHYLPREDNISOLONE SODIUM SUCC 125 MG IJ SOLR
125.0000 mg | Freq: Once | INTRAMUSCULAR | Status: AC
  Administered 2015-07-29: 125 mg via INTRAVENOUS
  Filled 2015-07-29: qty 2

## 2015-07-29 MED ORDER — DIPHENHYDRAMINE HCL 50 MG/ML IJ SOLN
25.0000 mg | Freq: Once | INTRAMUSCULAR | Status: AC
  Administered 2015-07-29: 25 mg via INTRAVENOUS
  Filled 2015-07-29: qty 1

## 2015-07-29 MED ORDER — IPRATROPIUM-ALBUTEROL 0.5-2.5 (3) MG/3ML IN SOLN
3.0000 mL | Freq: Once | RESPIRATORY_TRACT | Status: AC
  Administered 2015-07-29: 3 mL via RESPIRATORY_TRACT
  Filled 2015-07-29: qty 3

## 2015-07-29 NOTE — Telephone Encounter (Signed)
Pt called to let me know that he was itching on arms and back and had a rash that was under the skin on the right arm. By the time I got to call the patient back which is now @ 10:12 am. The patient is in the ER with shortness of breath and hives currently and states they are going to give him Benadryl & Prednisone.

## 2015-07-29 NOTE — ED Provider Notes (Signed)
CSN: [id number]     Arrival date & time 07/29/15  [id number] History   First MD Initiated Contact with Patient 07/29/15 1000     Chief Complaint  Patient presents with  . Shortness of Breath  . Urticaria      HPI Pt was seen at 1000. Per pt, c/o gradual onset and persistence of constant "hives all over" that began this morning at 0630. Pt took his usual meds and ate his usual breakfast this morning. Pt states yesterday he received 2 units of blood and neulasta injection, both also not new for him. Has been associated with mild SOB. Denies CP/palpitations, no cough, no sore throat/dysphagia, no abd pain, no N/V/D, no fevers.    Past Medical History  Diagnosis Date  . GERD (gastroesophageal reflux disease)   . Hypokalemia   . Hypertension   . Cataract   . Arthritis   . Macular degeneration, age related   . DVT (deep venous thrombosis) ([place])     developed after traveling x 1, developed another after left foot surgery  . Hydrocele in adult   . 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 Replacement total knee    . Appendectomy    . Hammer toe surgery Left   . Lapidus procedure    . Hallux fusion    . Colonoscopy  03/31/05  . Colonoscopy N/A 10/02/2014    [id number] diverticulosis  . Esophagogastroduodenoscopy N/A 10/02/2014    [id number] ulcerative reflux/s/p dilation/large HH  . Maloney dilation N/A 10/02/2014    Procedure: Venia Minks DILATION;  Surgeon: Daneil Dolin, MD;  [facility]: [facility];  Service: Endoscopy;  Laterality: N/A;  . Esophagogastroduodenoscopy N/A 01/29/2015    Procedure: ESOPHAGOGASTRODUODENOSCOPY (EGD);  Surgeon: Daneil Dolin, MD;  Location: [facility];  Service: Endoscopy;  Laterality: N/A;  0800-rescheduled to 9/9 @ 915 Candy notified pt  . Esophageal dilation  01/29/2015    Procedure: ESOPHAGEAL DILATION;  Surgeon: Daneil Dolin, MD;  Location: [facility];  Service: Endoscopy;;  . Portacath  placement Left 04/13/15   Family History  Problem Relation Age of Onset  . Colon cancer Maternal Uncle   . Heart disease Father    Social History  Substance Use Topics  . Smoking status: Former Smoker -- 1.00 packs/day for 31 years    Types: Cigarettes    Quit date: 01/22/1984  . Smokeless tobacco: Never Used     Comment: qUIT IN 1985  . Alcohol Use: No    Review of Systems ROS: Statement: All systems negative except as marked or noted in the HPI; Constitutional: Negative for fever and chills. ; ; Eyes: Negative for eye pain, redness and discharge. ; ; ENMT: Negative for ear pain, hoarseness, nasal congestion, sinus pressure and sore throat. ; ; Cardiovascular: Negative for chest pain, palpitations, diaphoresis, and peripheral edema. ; ; Respiratory: +SOB. Negative for cough, wheezing and stridor. ; ; Gastrointestinal: Negative for nausea, vomiting, diarrhea, abdominal pain, blood in stool, hematemesis, jaundice and rectal bleeding. . ; ; Genitourinary: Negative for dysuria, flank pain and hematuria. ; ; Musculoskeletal: Negative for back pain and neck pain. Negative for swelling and trauma.; ; Skin: +itching rash. Negative for abrasions, blisters, bruising and skin lesion.; ; Neuro: Negative for headache, lightheadedness and neck stiffness. Negative for weakness, altered level of consciousness , altered mental status, extremity weakness, paresthesias, involuntary movement, seizure and syncope.      Allergies  Neulasta; Rabeprazole;  and Vancomycin  Home Medications   Prior to Admission medications   Medication Sig Start Date End Date Taking? Authorizing Provider  acetaminophen (TYLENOL) 325 MG tablet Take 650 mg by mouth every 6 (six) hours as needed for mild pain or fever.   Yes Historical Provider, MD  aspirin 325 MG tablet Take 162.5 mg by mouth at bedtime.   Yes Historical Provider, MD  atorvastatin (LIPITOR) 80 MG tablet Take 1 tablet (80 mg total) by mouth daily at 6 PM. 06/25/15   Yes Lendon Colonel, NP  Calcium Carb-Cholecalciferol (CALCIUM 500 + D3) 500-600 MG-UNIT TABS Take 2 tablets by mouth daily.   Yes Historical Provider, MD  carvedilol (COREG) 6.25 MG tablet Take 1 & 1/2 tablets two times daily 06/25/15  Yes Lendon Colonel, NP  feeding supplement (BOOST / RESOURCE BREEZE) LIQD Take 1 Container by mouth daily.   Yes Historical Provider, MD  hydrocortisone cream 1 % Apply 1 application topically 2 (two) times daily as needed for itching (skin irritation on chest).   Yes Historical Provider, MD  lisinopril (PRINIVIL,ZESTRIL) 2.5 MG tablet Take 1 tablet (2.5 mg total) by mouth daily. 06/25/15  Yes Lendon Colonel, NP  Multiple Vitamins-Minerals (PRESERVISION AREDS 2 PO) Take 1 tablet by mouth 2 (two) times daily.   Yes Historical Provider, MD  omeprazole (PRILOSEC) 40 MG capsule Take 40 mg by mouth daily.    Yes Historical Provider, MD  ondansetron (ZOFRAN) 8 MG tablet Take 1 tablet (8 mg total) by mouth every 8 (eight) hours as needed for nausea or vomiting. 04/12/15  Yes Patrici Ranks, MD  aspirin 81 MG tablet Take 81 mg by mouth daily. Take 2 tabs (162 mg total) daily    Historical Provider, MD   BP 112/75 mmHg  Pulse 73  Temp(Src) 97.6 F (36.4 C) (Oral)  Resp 18  Ht 6\' 4"  (1.93 m)  Wt 282 lb (127.914 kg)  BMI 34.34 kg/m2  SpO2 95% Physical Exam 1005: Physical examination:  Nursing notes reviewed; Vital signs and O2 SAT reviewed;  Constitutional: Well developed, Well nourished, Well hydrated, In no acute distress; Head:  Normocephalic, atraumatic; Eyes: EOMI, PERRL, No scleral icterus; ENMT: Mouth and pharynx normal, Mucous membranes moist. Mouth and pharynx without lesions. No tonsillar exudates. No intra-oral edema. No submandibular or sublingual edema. No hoarse voice, no drooling, no stridor. No pain with manipulation of larynx. No trismus.; Neck: Supple, Full range of motion, No lymphadenopathy; Cardiovascular: Regular rate and rhythm, No gallop;  Respiratory: Breath sounds diminished & equal bilaterally. No wheezing. Speaking full sentences with ease, Normal respiratory effort/excursion; Chest: Nontender, Movement normal; Abdomen: Soft, Nontender, Nondistended, Normal bowel sounds; Genitourinary: No CVA tenderness; Extremities: Pulses normal, No tenderness, No edema, No calf edema or asymmetry.; Neuro: AA&Ox3, Major CN grossly intact.  Speech clear. No gross focal motor or sensory deficits in extremities.; Skin: Color normal, Warm, Dry, +scattered hives.   ED Course  Procedures (including critical care time) Labs Review   Imaging Review  I have personally reviewed and evaluated these images and lab results as part of my medical decision-making.   EKG Interpretation None      MDM  MDM Reviewed: previous chart, nursing note and vitals Reviewed previous: labs Interpretation: labs and x-ray     Results for orders placed or performed during the hospital encounter of 07/29/15  CBC with Differential  Result Value Ref Range   WBC 8.7 4.0 - 10.5 K/uL   RBC 3.83 (L) 4.22 -  5.81 MIL/uL   Hemoglobin 12.3 (L) 13.0 - 17.0 g/dL   HCT 36.9 (L) 39.0 - 52.0 %   MCV 96.3 78.0 - 100.0 fL   MCH 32.1 26.0 - 34.0 pg   MCHC 33.3 30.0 - 36.0 g/dL   RDW 19.1 (H) 11.5 - 15.5 %   Platelets 49 (L) 150 - 400 K/uL   Neutrophils Relative % 89 %   Lymphocytes Relative 7 %   Monocytes Relative 4 %   Eosinophils Relative 0 %   Basophils Relative 0 %   Neutro Abs 7.8 (H) 1.7 - 7.7 K/uL   Lymphs Abs 0.6 (L) 0.7 - 4.0 K/uL   Monocytes Absolute 0.3 0.1 - 1.0 K/uL   Eosinophils Absolute 0.0 0.0 - 0.7 K/uL   Basophils Absolute 0.0 0.0 - 0.1 K/uL   WBC Morphology MILD LEFT SHIFT (1-5% METAS, OCC MYELO, OCC BANDS)   Comprehensive metabolic panel  Result Value Ref Range   Sodium 136 135 - 145 mmol/L   Potassium 4.4 3.5 - 5.1 mmol/L   Chloride 107 101 - 111 mmol/L   CO2 23 22 - 32 mmol/L   Glucose, Bld 150 (H) 65 - 99 mg/dL   BUN 28 (H) 6 - 20  mg/dL   Creatinine, Ser 1.09 0.61 - 1.24 mg/dL   Calcium 8.3 (L) 8.9 - 10.3 mg/dL   Total Protein 5.9 (L) 6.5 - 8.1 g/dL   Albumin 2.9 (L) 3.5 - 5.0 g/dL   AST 51 (H) 15 - 41 U/L   ALT 43 17 - 63 U/L   Alkaline Phosphatase 112 38 - 126 U/L   Total Bilirubin 1.1 0.3 - 1.2 mg/dL   GFR calc non Af Amer >60 >60 mL/min   GFR calc Af Amer >60 >60 mL/min   Anion gap 6 5 - 15    Dg Chest Port 1 View 07/29/2015  CLINICAL DATA:  Cancer patient, metastatic urothelial carcinoma, shortness of breath, itching all over, with received 2 units of blood transfusion yesterday and Neulasta injection yesterday, history hypertension, former smoker EXAM: PORTABLE CHEST 1 VIEW COMPARISON:  CT chest 06/08/2015 ; chest radiograph 06/30/2013 FINDINGS: RIGHT jugular Port-A-Cath stable tip projecting over SVC. Mild enlargement of cardiac silhouette. Mediastinal contours and pulmonary vascularity normal. COPD changes with minimal bibasilar opacities unchanged since 2015. No definite acute infiltrate or pleural effusion. Bullous change versus less likely small loculated pneumothorax RIGHT apex unchanged. No LEFT pneumothorax identified. Osseous structures unremarkable. 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 RIGHT apex scarring at question bullous disease versus less likely small loculated pneumothorax unchanged since 06/08/2015 CT. No acute abnormalities. Electronically Signed   By: Lavonia Dana M.D.   On: 07/29/2015 10:33    1025:  +hives. Tx IV pepcid, benadryl, solumedrol.  T/C to Oncology Dr. Whitney Muse, case discussed, including:  HPI, pertinent PM/SHx, VS/PE, dx testing, ED course and treatment:  Agrees with ED treatment, pt can develop hives from neulasta injection; if VS remain stable, pt can be d/c and continue tx symptomatically for allergic rxn, f/u office. Will obs for 4 hours after meds. Dx and testing, as well as d/w Onc MD, d/w pt.  Questions answered.  Verb understanding, agreeable with  plan.   1445:  Pt has tol PO well while in the ED without N/V. Pt has ambulated with steady gait, easy resps, Sats remain 96-98% R/A, NAD. Hives and SOB improved after meds; continues improved after 4 hours observation. Pt states he is ready to go  home now. Agreeable to d/c home with outpt f/u.   Francine Graven, DO 08/02/15 [phone]

## 2015-07-29 NOTE — Discharge Instructions (Signed)
Take the prescription as directed.  Take over the counter benadryl, as directed on packaging, as needed for itching.  If the benadryl is too sedating, take an over the counter non-sedating antihistamine such as claritin, allegra or zyrtec, as directed on packaging.  Call your regular Oncologist today to schedule a follow up appointment within the next 2 days.  Return to the Emergency Department immediately sooner if worsening.

## 2015-07-29 NOTE — ED Notes (Signed)
Cancer pt, c/o itching to entire body.  Notice itching this am.  Got 2 units of blood yesterday and Neulasta injection yesterday at [facility].  C/o SOB.

## 2015-07-30 ENCOUNTER — Ambulatory Visit (HOSPITAL_COMMUNITY): Payer: Medicare HMO

## 2015-07-30 ENCOUNTER — Other Ambulatory Visit (HOSPITAL_COMMUNITY): Payer: Medicare HMO

## 2015-08-02 ENCOUNTER — Inpatient Hospital Stay (HOSPITAL_COMMUNITY): Payer: Medicare HMO

## 2015-08-02 ENCOUNTER — Emergency Department (HOSPITAL_COMMUNITY): Payer: Medicare HMO

## 2015-08-02 ENCOUNTER — Encounter (HOSPITAL_COMMUNITY): Payer: Medicare HMO

## 2015-08-02 ENCOUNTER — Other Ambulatory Visit: Payer: Self-pay

## 2015-08-02 ENCOUNTER — Inpatient Hospital Stay (HOSPITAL_COMMUNITY)
Admission: EM | Admit: 2015-08-02 | Discharge: 2015-08-09 | DRG: 166 | Disposition: A | Discharge status: Home Health | Payer: Medicare HMO | Attending: Internal Medicine | Admitting: Internal Medicine

## 2015-08-02 ENCOUNTER — Encounter (HOSPITAL_COMMUNITY): Payer: Self-pay | Admitting: Cardiology

## 2015-08-02 DIAGNOSIS — D61810 Antineoplastic chemotherapy induced pancytopenia: Secondary | ICD-10-CM | POA: Diagnosis not present

## 2015-08-02 DIAGNOSIS — Z79899 Other long term (current) drug therapy: Secondary | ICD-10-CM

## 2015-08-02 DIAGNOSIS — I82413 Acute embolism and thrombosis of femoral vein, bilateral: Secondary | ICD-10-CM | POA: Diagnosis present

## 2015-08-02 DIAGNOSIS — Z888 Allergy status to other drugs, medicaments and biological substances status: Secondary | ICD-10-CM

## 2015-08-02 DIAGNOSIS — I468 Cardiac arrest due to other underlying condition: Secondary | ICD-10-CM | POA: Diagnosis not present

## 2015-08-02 DIAGNOSIS — I469 Cardiac arrest, cause unspecified: Secondary | ICD-10-CM | POA: Diagnosis present

## 2015-08-02 DIAGNOSIS — R092 Respiratory arrest: Secondary | ICD-10-CM | POA: Diagnosis not present

## 2015-08-02 DIAGNOSIS — K219 Gastro-esophageal reflux disease without esophagitis: Secondary | ICD-10-CM | POA: Diagnosis present

## 2015-08-02 DIAGNOSIS — C7910 Secondary malignant neoplasm of unspecified urinary organs: Secondary | ICD-10-CM

## 2015-08-02 DIAGNOSIS — I5023 Acute on chronic systolic (congestive) heart failure: Secondary | ICD-10-CM | POA: Diagnosis present

## 2015-08-02 DIAGNOSIS — I82433 Acute embolism and thrombosis of popliteal vein, bilateral: Secondary | ICD-10-CM | POA: Diagnosis present

## 2015-08-02 DIAGNOSIS — C787 Secondary malignant neoplasm of liver and intrahepatic bile duct: Secondary | ICD-10-CM | POA: Diagnosis not present

## 2015-08-02 DIAGNOSIS — Z881 Allergy status to other antibiotic agents status: Secondary | ICD-10-CM | POA: Diagnosis not present

## 2015-08-02 DIAGNOSIS — J810 Acute pulmonary edema: Secondary | ICD-10-CM | POA: Diagnosis not present

## 2015-08-02 DIAGNOSIS — C680 Malignant neoplasm of urethra: Secondary | ICD-10-CM | POA: Diagnosis present

## 2015-08-02 DIAGNOSIS — Z66 Do not resuscitate: Secondary | ICD-10-CM | POA: Diagnosis not present

## 2015-08-02 DIAGNOSIS — J9690 Respiratory failure, unspecified, unspecified whether with hypoxia or hypercapnia: Secondary | ICD-10-CM

## 2015-08-02 DIAGNOSIS — J9601 Acute respiratory failure with hypoxia: Secondary | ICD-10-CM

## 2015-08-02 DIAGNOSIS — D62 Acute posthemorrhagic anemia: Secondary | ICD-10-CM | POA: Diagnosis not present

## 2015-08-02 DIAGNOSIS — Z87891 Personal history of nicotine dependence: Secondary | ICD-10-CM | POA: Diagnosis not present

## 2015-08-02 DIAGNOSIS — I2510 Atherosclerotic heart disease of native coronary artery without angina pectoris: Secondary | ICD-10-CM | POA: Diagnosis present

## 2015-08-02 DIAGNOSIS — D649 Anemia, unspecified: Secondary | ICD-10-CM | POA: Insufficient documentation

## 2015-08-02 DIAGNOSIS — T451X5A Adverse effect of antineoplastic and immunosuppressive drugs, initial encounter: Secondary | ICD-10-CM | POA: Diagnosis not present

## 2015-08-02 DIAGNOSIS — I4891 Unspecified atrial fibrillation: Secondary | ICD-10-CM | POA: Diagnosis not present

## 2015-08-02 DIAGNOSIS — I480 Paroxysmal atrial fibrillation: Secondary | ICD-10-CM | POA: Diagnosis present

## 2015-08-02 DIAGNOSIS — I2699 Other pulmonary embolism without acute cor pulmonale: Secondary | ICD-10-CM | POA: Diagnosis present

## 2015-08-02 DIAGNOSIS — Z86718 Personal history of other venous thrombosis and embolism: Secondary | ICD-10-CM | POA: Diagnosis not present

## 2015-08-02 DIAGNOSIS — H3530 Unspecified macular degeneration: Secondary | ICD-10-CM | POA: Diagnosis present

## 2015-08-02 DIAGNOSIS — R739 Hyperglycemia, unspecified: Secondary | ICD-10-CM | POA: Diagnosis present

## 2015-08-02 DIAGNOSIS — R0600 Dyspnea, unspecified: Secondary | ICD-10-CM | POA: Diagnosis not present

## 2015-08-02 DIAGNOSIS — N179 Acute kidney failure, unspecified: Secondary | ICD-10-CM

## 2015-08-02 DIAGNOSIS — C679 Malignant neoplasm of bladder, unspecified: Secondary | ICD-10-CM

## 2015-08-02 DIAGNOSIS — I129 Hypertensive chronic kidney disease with stage 1 through stage 4 chronic kidney disease, or unspecified chronic kidney disease: Secondary | ICD-10-CM | POA: Diagnosis not present

## 2015-08-02 DIAGNOSIS — I252 Old myocardial infarction: Secondary | ICD-10-CM | POA: Diagnosis not present

## 2015-08-02 DIAGNOSIS — Z7982 Long term (current) use of aspirin: Secondary | ICD-10-CM | POA: Diagnosis not present

## 2015-08-02 DIAGNOSIS — N182 Chronic kidney disease, stage 2 (mild): Secondary | ICD-10-CM | POA: Diagnosis not present

## 2015-08-02 DIAGNOSIS — G9341 Metabolic encephalopathy: Secondary | ICD-10-CM | POA: Diagnosis not present

## 2015-08-02 DIAGNOSIS — E785 Hyperlipidemia, unspecified: Secondary | ICD-10-CM | POA: Diagnosis not present

## 2015-08-02 DIAGNOSIS — D696 Thrombocytopenia, unspecified: Secondary | ICD-10-CM | POA: Insufficient documentation

## 2015-08-02 DIAGNOSIS — C7970 Secondary malignant neoplasm of unspecified adrenal gland: Secondary | ICD-10-CM | POA: Diagnosis present

## 2015-08-02 LAB — URINALYSIS, ROUTINE W REFLEX MICROSCOPIC
Bilirubin Urine: NEGATIVE
GLUCOSE, UA: NEGATIVE mg/dL
Ketones, ur: NEGATIVE mg/dL
LEUKOCYTES UA: NEGATIVE
NITRITE: NEGATIVE
PH: 5 (ref 5.0–8.0)
Protein, ur: NEGATIVE mg/dL
SPECIFIC GRAVITY, URINE: 1.01 (ref 1.005–1.030)

## 2015-08-02 LAB — COMPREHENSIVE METABOLIC PANEL
ALBUMIN: 3.4 g/dL — AB (ref 3.5–5.0)
ALK PHOS: 149 U/L — AB (ref 38–126)
ALT: 44 U/L (ref 17–63)
ALT: 44 U/L (ref 17–63)
ANION GAP: 6 (ref 5–15)
ANION GAP: 10 (ref 5–15)
AST: 43 U/L — ABNORMAL HIGH (ref 15–41)
AST: 48 U/L — AB (ref 15–41)
Albumin: 3.3 g/dL — ABNORMAL LOW (ref 3.5–5.0)
Alkaline Phosphatase: 172 U/L — ABNORMAL HIGH (ref 38–126)
BILIRUBIN TOTAL: 1.1 mg/dL (ref 0.3–1.2)
BUN: 31 mg/dL — ABNORMAL HIGH (ref 6–20)
BUN: 30 mg/dL — AB (ref 6–20)
CALCIUM: 8.8 mg/dL — AB (ref 8.9–10.3)
CHLORIDE: 105 mmol/L (ref 101–111)
CO2: 27 mmol/L (ref 22–32)
CO2: 22 mmol/L (ref 22–32)
Calcium: 8.6 mg/dL — ABNORMAL LOW (ref 8.9–10.3)
Chloride: 106 mmol/L (ref 101–111)
Creatinine, Ser: 1.34 mg/dL — ABNORMAL HIGH (ref 0.61–1.24)
Creatinine, Ser: 1.54 mg/dL — ABNORMAL HIGH (ref 0.61–1.24)
GFR calc non Af Amer: 51 mL/min — ABNORMAL LOW (ref >60)
GFR, EST AFRICAN AMERICAN: 59 mL/min — AB (ref >60)
GFR, EST AFRICAN AMERICAN: 50 mL/min — AB (ref >60)
GFR, EST NON AFRICAN AMERICAN: 43 mL/min — AB (ref >60)
Glucose, Bld: 161 mg/dL — ABNORMAL HIGH (ref 65–99)
Glucose, Bld: 262 mg/dL — ABNORMAL HIGH (ref 65–99)
POTASSIUM: 4.9 mmol/L (ref 3.5–5.1)
POTASSIUM: 4.2 mmol/L (ref 3.5–5.1)
SODIUM: 139 mmol/L (ref 135–145)
Sodium: 137 mmol/L (ref 135–145)
TOTAL PROTEIN: 6.4 g/dL — AB (ref 6.5–8.1)
Total Bilirubin: 1.2 mg/dL (ref 0.3–1.2)
Total Protein: 6.4 g/dL — ABNORMAL LOW (ref 6.5–8.1)

## 2015-08-02 LAB — CBC WITH DIFFERENTIAL/PLATELET
BASOS ABS: 0 10*3/uL (ref 0.0–0.1)
BASOS ABS: 0 10*3/uL (ref 0.0–0.1)
BASOS PCT: 0 %
BASOS PCT: 0 %
EOS PCT: 0 %
Eosinophils Absolute: 0 10*3/uL (ref 0.0–0.7)
Eosinophils Absolute: 0 10*3/uL (ref 0.0–0.7)
Eosinophils Relative: 0 %
HEMATOCRIT: 34.1 % — AB (ref 39.0–52.0)
HEMATOCRIT: 37.6 % — AB (ref 39.0–52.0)
HEMOGLOBIN: 10.7 g/dL — AB (ref 13.0–17.0)
Hemoglobin: 11.6 g/dL — ABNORMAL LOW (ref 13.0–17.0)
LYMPHS PCT: 6 %
LYMPHS PCT: 12 %
Lymphs Abs: 1 10*3/uL (ref 0.7–4.0)
Lymphs Abs: 3.9 10*3/uL (ref 0.7–4.0)
MCH: 32.4 pg (ref 26.0–34.0)
MCH: 32.4 pg (ref 26.0–34.0)
MCHC: 31.4 g/dL (ref 30.0–36.0)
MCHC: 30.9 g/dL (ref 30.0–36.0)
MCV: 103.3 fL — ABNORMAL HIGH (ref 78.0–100.0)
MCV: 105 fL — ABNORMAL HIGH (ref 78.0–100.0)
MONO ABS: 6.3 10*3/uL — AB (ref 0.1–1.0)
Monocytes Absolute: 2.8 10*3/uL — ABNORMAL HIGH (ref 0.1–1.0)
Monocytes Relative: 16 %
Monocytes Relative: 19 %
NEUTROS ABS: 13.8 10*3/uL — AB (ref 1.7–7.7)
NEUTROS PCT: 79 %
NEUTROS PCT: 69 %
Neutro Abs: 22.9 10*3/uL — ABNORMAL HIGH (ref 1.7–7.7)
Platelets: 32 10*3/uL — ABNORMAL LOW (ref 150–400)
Platelets: 37 10*3/uL — ABNORMAL LOW (ref 150–400)
RBC: 3.3 MIL/uL — AB (ref 4.22–5.81)
RBC: 3.58 MIL/uL — ABNORMAL LOW (ref 4.22–5.81)
RDW: 20.5 % — ABNORMAL HIGH (ref 11.5–15.5)
RDW: 20.8 % — AB (ref 11.5–15.5)
SMEAR REVIEW: DECREASED
Smear Review: DECREASED
WBC: 17.5 10*3/uL — ABNORMAL HIGH (ref 4.0–10.5)
WBC: 33.2 10*3/uL — AB (ref 4.0–10.5)

## 2015-08-02 LAB — BLOOD GAS, ARTERIAL
ACID-BASE EXCESS: 1.3 mmol/L (ref 0.0–2.0)
Acid-base deficit: 3.3 mmol/L — ABNORMAL HIGH (ref 0.0–2.0)
BICARBONATE: 25.6 meq/L — AB (ref 20.0–24.0)
Bicarbonate: 20.5 mEq/L (ref 20.0–24.0)
Drawn by: 23534
Drawn by: 23534
LHR: 12 {breaths}/min
LHR: 15 {breaths}/min
O2 CONTENT: 100 L/min
O2 CONTENT: 100 L/min
O2 Saturation: 93.6 %
O2 Saturation: 98.7 %
PEEP/CPAP: 5 cmH2O
PEEP: 5 cmH2O
VT: 690 mL
VT: 690 mL
pCO2 arterial: 62.7 mmHg (ref 35.0–45.0)
pCO2 arterial: 40.4 mmHg (ref 35.0–45.0)
pH, Arterial: 7.202 — ABNORMAL LOW (ref 7.350–7.450)
pH, Arterial: 7.415 (ref 7.350–7.450)
pO2, Arterial: 90.3 mmHg (ref 80.0–100.0)
pO2, Arterial: 139 mmHg — ABNORMAL HIGH (ref 80.0–100.0)

## 2015-08-02 LAB — I-STAT CHEM 8, ED
BUN: 31 mg/dL — ABNORMAL HIGH (ref 6–20)
CALCIUM ION: 1.27 mmol/L (ref 1.13–1.30)
CREATININE: 1.3 mg/dL — AB (ref 0.61–1.24)
Chloride: 103 mmol/L (ref 101–111)
Glucose, Bld: 257 mg/dL — ABNORMAL HIGH (ref 65–99)
HEMATOCRIT: 40 % (ref 39.0–52.0)
HEMOGLOBIN: 13.6 g/dL (ref 13.0–17.0)
Potassium: 4.1 mmol/L (ref 3.5–5.1)
Sodium: 140 mmol/L (ref 135–145)
TCO2: 24 mmol/L (ref 0–100)

## 2015-08-02 LAB — URINE MICROSCOPIC-ADD ON: BACTERIA UA: NONE SEEN

## 2015-08-02 LAB — I-STAT CG4 LACTIC ACID, ED: LACTIC ACID, VENOUS: 2.29 mmol/L — AB (ref 0.5–2.0)

## 2015-08-02 LAB — TROPONIN I: TROPONIN I: 0.69 ng/mL — AB (ref <0.031)

## 2015-08-02 LAB — GLUCOSE, CAPILLARY
GLUCOSE-CAPILLARY: 80 mg/dL (ref 65–99)
Glucose-Capillary: 99 mg/dL (ref 65–99)
Glucose-Capillary: 77 mg/dL (ref 65–99)

## 2015-08-02 LAB — I-STAT TROPONIN, ED: TROPONIN I, POC: 0.18 ng/mL — AB (ref 0.00–0.08)

## 2015-08-02 LAB — PROTIME-INR
INR: 1.51 — AB (ref 0.00–1.49)
Prothrombin Time: 18.3 seconds — ABNORMAL HIGH (ref 11.6–15.2)

## 2015-08-02 LAB — LACTIC ACID, PLASMA: Lactic Acid, Venous: 1.4 mmol/L (ref 0.5–2.0)

## 2015-08-02 LAB — APTT: aPTT: 106 seconds — ABNORMAL HIGH (ref 24–37)

## 2015-08-02 LAB — PHOSPHORUS: PHOSPHORUS: 4 mg/dL (ref 2.5–4.6)

## 2015-08-02 LAB — BRAIN NATRIURETIC PEPTIDE: B NATRIURETIC PEPTIDE 5: 721 pg/mL — AB (ref 0.0–100.0)

## 2015-08-02 LAB — TRIGLYCERIDES: Triglycerides: 49 mg/dL (ref <150)

## 2015-08-02 LAB — MAGNESIUM: MAGNESIUM: 1.2 mg/dL — AB (ref 1.7–2.4)

## 2015-08-02 LAB — MRSA PCR SCREENING: MRSA by PCR: NEGATIVE

## 2015-08-02 LAB — CBG MONITORING, ED: GLUCOSE-CAPILLARY: 224 mg/dL — AB (ref 65–99)

## 2015-08-02 MED ORDER — ATORVASTATIN CALCIUM 80 MG PO TABS
80.0000 mg | ORAL_TABLET | Freq: Every day | ORAL | Status: DC
  Administered 2015-08-02: 80 mg
  Filled 2015-08-02: qty 1

## 2015-08-02 MED ORDER — MIDAZOLAM HCL 2 MG/2ML IJ SOLN
2.0000 mg | Freq: Once | INTRAMUSCULAR | Status: AC
Start: 2015-08-02 — End: 2015-08-02
  Administered 2015-08-02: 2 mg via INTRAVENOUS

## 2015-08-02 MED ORDER — ANTISEPTIC ORAL RINSE SOLUTION (CORINZ)
7.0000 mL | Freq: Four times a day (QID) | OROMUCOSAL | Status: DC
  Administered 2015-08-02: 7 mL via OROMUCOSAL

## 2015-08-02 MED ORDER — PROPOFOL 1000 MG/100ML IV EMUL
10.0000 ug/kg/min | Freq: Once | INTRAVENOUS | Status: AC
  Administered 2015-08-02: 10 ug/kg/min via INTRAVENOUS

## 2015-08-02 MED ORDER — FAMOTIDINE IN NACL 20-0.9 MG/50ML-% IV SOLN
20.0000 mg | Freq: Two times a day (BID) | INTRAVENOUS | Status: DC
  Administered 2015-08-02: 20 mg via INTRAVENOUS
  Filled 2015-08-02: qty 50

## 2015-08-02 MED ORDER — CHLORHEXIDINE GLUCONATE 0.12% ORAL RINSE (MEDLINE KIT)
15.0000 mL | Freq: Two times a day (BID) | OROMUCOSAL | Status: DC
  Administered 2015-08-02 – 2015-08-03 (×2): 15 mL via OROMUCOSAL

## 2015-08-02 MED ORDER — LINEZOLID 600 MG/300ML IV SOLN
600.0000 mg | Freq: Two times a day (BID) | INTRAVENOUS | Status: DC
  Administered 2015-08-02: 600 mg via INTRAVENOUS
  Filled 2015-08-02 (×2): qty 300

## 2015-08-02 MED ORDER — PIPERACILLIN-TAZOBACTAM 3.375 G IVPB
3.3750 g | Freq: Three times a day (TID) | INTRAVENOUS | Status: DC
  Administered 2015-08-02: 3.375 g via INTRAVENOUS
  Filled 2015-08-02 (×3): qty 50

## 2015-08-02 MED ORDER — DEXTROSE 5 % IV SOLN
1.0000 g | Freq: Once | INTRAVENOUS | Status: AC
  Administered 2015-08-02: 1 g via INTRAVENOUS
  Filled 2015-08-02: qty 1

## 2015-08-02 MED ORDER — DEXTROSE 5 % IV SOLN
0.0000 ug/min | INTRAVENOUS | Status: DC
  Filled 2015-08-02: qty 4

## 2015-08-02 MED ORDER — MIDAZOLAM HCL 2 MG/2ML IJ SOLN
INTRAMUSCULAR | Status: AC
  Administered 2015-08-02: 2 mg via INTRAVENOUS
  Filled 2015-08-02: qty 2

## 2015-08-02 MED ORDER — MIDAZOLAM HCL 2 MG/2ML IJ SOLN
INTRAMUSCULAR | Status: AC
  Filled 2015-08-02: qty 4

## 2015-08-02 MED ORDER — CHLORHEXIDINE GLUCONATE 0.12% ORAL RINSE (MEDLINE KIT): 15.0000 mL | Freq: Two times a day (BID) | OROMUCOSAL | Status: DC

## 2015-08-02 MED ORDER — FENTANYL CITRATE (PF) 2500 MCG/50ML IJ SOLN
10.0000 ug/h | INTRAMUSCULAR | Status: DC
  Administered 2015-08-02: 50 ug/h via INTRAVENOUS
  Filled 2015-08-02: qty 50

## 2015-08-02 MED ORDER — FENTANYL BOLUS VIA INFUSION
25.0000 ug | INTRAVENOUS | Status: DC | PRN
  Filled 2015-08-02: qty 25

## 2015-08-02 MED ORDER — SODIUM CHLORIDE 0.9 % IV SOLN
INTRAVENOUS | Status: DC
  Administered 2015-08-03 – 2015-08-09 (×4): via INTRAVENOUS

## 2015-08-02 MED ORDER — MIDAZOLAM HCL 2 MG/2ML IJ SOLN
4.0000 mg | Freq: Once | INTRAMUSCULAR | Status: AC
  Administered 2015-08-02: 4 mg via INTRAVENOUS

## 2015-08-02 MED ORDER — SODIUM CHLORIDE 0.9 % IV SOLN: 250.0000 mL | INTRAVENOUS | Status: DC | PRN

## 2015-08-02 MED ORDER — INSULIN ASPART 100 UNIT/ML ~~LOC~~ SOLN: 0.0000 [IU] | SUBCUTANEOUS | Status: DC

## 2015-08-02 MED ORDER — PROPOFOL 1000 MG/100ML IV EMUL
5.0000 ug/kg/min | Freq: Once | INTRAVENOUS | Status: AC
  Administered 2015-08-02: 40 ug/kg/min via INTRAVENOUS
  Administered 2015-08-02: 15 ug/kg/min via INTRAVENOUS

## 2015-08-02 MED ORDER — ANTISEPTIC ORAL RINSE SOLUTION (CORINZ)
7.0000 mL | Freq: Four times a day (QID) | OROMUCOSAL | Status: DC
  Administered 2015-08-02 – 2015-08-03 (×3): 7 mL via OROMUCOSAL

## 2015-08-02 MED ORDER — PROPOFOL 1000 MG/100ML IV EMUL
INTRAVENOUS | Status: AC
  Filled 2015-08-02: qty 100

## 2015-08-02 MED ORDER — ASPIRIN 325 MG PO TABS
162.5000 mg | ORAL_TABLET | Freq: Every day | ORAL | Status: DC
  Administered 2015-08-02: 162.5 mg
  Filled 2015-08-02: qty 1

## 2015-08-02 MED ORDER — HEPARIN BOLUS VIA INFUSION
3000.0000 [IU] | Freq: Once | INTRAVENOUS | Status: AC
  Administered 2015-08-02: 3000 [IU] via INTRAVENOUS
  Filled 2015-08-02: qty 3000

## 2015-08-02 MED ORDER — FENTANYL CITRATE (PF) 100 MCG/2ML IJ SOLN: 50.0000 ug | Freq: Once | INTRAMUSCULAR | Status: DC

## 2015-08-02 MED ORDER — PROPOFOL 1000 MG/100ML IV EMUL
INTRAVENOUS | Status: AC
Start: 2015-08-02 — End: 2015-08-03
  Filled 2015-08-02: qty 100

## 2015-08-02 MED ORDER — LINEZOLID 600 MG/300ML IV SOLN
600.0000 mg | Freq: Once | INTRAVENOUS | Status: AC
  Administered 2015-08-02: 600 mg via INTRAVENOUS
  Filled 2015-08-02: qty 300

## 2015-08-02 MED ORDER — PROPOFOL 1000 MG/100ML IV EMUL
5.0000 ug/kg/min | INTRAVENOUS | Status: DC
  Administered 2015-08-02 (×3): 40 ug/kg/min via INTRAVENOUS
  Administered 2015-08-03 (×2): 35 ug/kg/min via INTRAVENOUS
  Filled 2015-08-02 (×4): qty 100

## 2015-08-02 MED ORDER — FUROSEMIDE 10 MG/ML IJ SOLN
40.0000 mg | INTRAMUSCULAR | Status: AC
  Administered 2015-08-02: 40 mg via INTRAVENOUS
  Filled 2015-08-02: qty 4

## 2015-08-02 MED ORDER — ALBUTEROL SULFATE (2.5 MG/3ML) 0.083% IN NEBU
2.5000 mg | INHALATION_SOLUTION | RESPIRATORY_TRACT | Status: DC
  Administered 2015-08-02 – 2015-08-03 (×5): 2.5 mg via RESPIRATORY_TRACT
  Filled 2015-08-02 (×5): qty 3

## 2015-08-02 MED ORDER — SODIUM CHLORIDE 0.9 % IV SOLN
25.0000 ug/h | INTRAVENOUS | Status: DC
  Administered 2015-08-02: 75 ug/h via INTRAVENOUS
  Filled 2015-08-02: qty 50

## 2015-08-02 MED ORDER — HEPARIN (PORCINE) IN NACL 100-0.45 UNIT/ML-% IJ SOLN
1850.0000 [IU]/h | INTRAMUSCULAR | Status: DC
  Administered 2015-08-02: 1500 [IU]/h via INTRAVENOUS
  Administered 2015-08-03: 1600 [IU]/h via INTRAVENOUS
  Administered 2015-08-04 – 2015-08-07 (×4): 1700 [IU]/h via INTRAVENOUS
  Administered 2015-08-07 – 2015-08-08 (×2): 1850 [IU]/h via INTRAVENOUS
  Filled 2015-08-02 (×10): qty 250

## 2015-08-02 MED ORDER — HEPARIN SODIUM (PORCINE) 5000 UNIT/ML IJ SOLN
5000.0000 [IU] | Freq: Three times a day (TID) | INTRAMUSCULAR | Status: DC
  Administered 2015-08-02: 5000 [IU] via SUBCUTANEOUS
  Filled 2015-08-02: qty 1

## 2015-08-02 NOTE — ED Notes (Signed)
Bilateral hearing aids removed at this time and placed in denture cup and labeled.

## 2015-08-02 NOTE — ED Notes (Signed)
CPR started at this time.

## 2015-08-02 NOTE — Progress Notes (Signed)
Pharmacy Antibiotic Note  Harry Franklin is a 75 y.o. male admitted on 08/02/2015 with sepsis. He was found in his car with severe respiratory distress. The patient has a history of metastatic bladder cancer and DVTs.  Pharmacy has been consulted for Zyvox and Zosyn dosing for likely pneumonia. WBC elevated at 33.2. Afebrile. CrCl ~ 75 mL/min. LA 2.29. Of note, patient is allergic to Vancomycin and has already received a dose of Cefepime.  Plan: -Start Zyvox 600 mg IV Q 12 hours and Zosyn 3.375 gm IV Q 8 hours -Monitor CBC, renal fx, cultures and clinical progress   Height: 6\' 4"  (193 cm) Weight: 282 lb (127.914 kg) IBW/kg (Calculated) : 86.8  Temp (24hrs), Avg:97.1 F (36.2 C), Min:96.8 F (36 C), Max:97.4 F (36.3 C)   Recent Labs Lab 07/28/15 0955 07/29/15 0916 08/02/15 0912 08/02/15 1039 08/02/15 1040 08/02/15 1154  WBC 1.3* 8.7 17.5* 33.2*  --   --   CREATININE 0.99 1.09 1.34* 1.54* 1.30*  --   LATICACIDVEN  --   --   --   --   --  2.29*    Estimated Creatinine Clearance: 75 mL/min (by C-G formula based on Cr of 1.3).    Allergies  Allergen Reactions  . Neulasta [Pegfilgrastim] Hives    Pt reports breaking out in hives less than 24 hours after taking neulasta. Hives appear on arms and legs.  . Rabeprazole     Other reaction(s): DIARRHEA  . Vancomycin Hives and Rash    Antimicrobials this admission: 3/13 Zosyn>> 3/13 Zyvox>> 3/13 Cefepime x 1 dose   Dose adjustments this admission: None   Microbiology results: 3/13 UCx>> 3/13 BCx2>>  Thank you for allowing pharmacy to be a part of this patient's care.  Albertina Parr, PharmD., BCPS Clinical Pharmacist Pager [phone]

## 2015-08-02 NOTE — ED Notes (Signed)
Was here as an outpatient this morning for lab work.  States he had a sudden onset of sob after leaving this morning. Pt on bi pap upon arrival .  Initial sat 72%.  Non rebreather came up to 89%.

## 2015-08-02 NOTE — ED Notes (Signed)
Pt transferred via [facility].  

## 2015-08-02 NOTE — ED Notes (Signed)
PT intubated with 8.0 at this time secured @25  at the lip.

## 2015-08-02 NOTE — ED Notes (Signed)
Rocuronum 50mg  given IV at this time for sedation after intubation.

## 2015-08-02 NOTE — Progress Notes (Signed)
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 No evidence of superficial thrombosis. No Baker's cyst. Left leg- Acute thrombus DVT noted in mid-distal section of femoral vein and popliteal vein.   eICU Interventions  Will order: 1. D/C Heparin [place]. 2. Heparin IV infusion per pharmacy consult.      Intervention Category Major Interventions: Other:  Lysle Dingwall 08/02/2015, 7:15 PM

## 2015-08-02 NOTE — ED Notes (Signed)
Dr. Sabra Heck present at bedside at this time and asking pt's permission to be ventilated and pt stated he would like a breathing machine if needed.

## 2015-08-02 NOTE — ED Notes (Signed)
Rosc achieved at this time and pulses returned at this time with ETT tube in place for airway.

## 2015-08-02 NOTE — Progress Notes (Signed)
VASCULAR LAB PRELIMINARY  PRELIMINARY  PRELIMINARY  PRELIMINARY  Bilateral lower extremity venous duplex completed.     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 No evidence of superficial thrombosis.  No Baker's cyst.  Left leg- Acute thrombus DVT noted in mid-distal section of  femoral vein and popliteal vein.    Janifer Adie, RVT, RDMS 08/02/2015, 6:40 PM

## 2015-08-02 NOTE — ED Provider Notes (Signed)
CSN: [id number]     Arrival date & time 08/02/15  1018 History   First MD Initiated Contact with Patient 08/02/15 1040     Chief Complaint  Patient presents with  . Shortness of Breath     (Consider location/radiation/quality/duration/timing/severity/associated sxs/prior Treatment) HPI Comments: The patient is a 75 year old male here rise from home by ambulance, reportedly the patient was found in his car in severe respiratory distress after being at the hospital this morning getting a blood draw at the cancer center. The patient is unable to give me any information, he is in severe respiratory distress, diaphoretic, unstable, paramedics started with nonrebreather however he decompensated and they required BiPAP which helped with oxygenation though not with his work of breathing. The patient does have a history of metastatic bladder cancer, DVTs, there is no list of anticoagulation other than aspirin on his list.  Patient is a 75 y.o. male presenting with shortness of breath. The history is provided by the EMS personnel.  Shortness of Breath   Past Medical History  Diagnosis Date  . GERD (gastroesophageal reflux disease)   . Hypokalemia   . Hypertension   . Cataract   . Arthritis   . Macular degeneration, age related   . DVT (deep venous thrombosis) ([place])     developed after traveling x 1, developed another after left foot surgery  . Hydrocele in adult   . 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 Replacement total knee    . Appendectomy    . Hammer toe surgery Left   . Lapidus procedure    . Hallux fusion    . Colonoscopy  03/31/05  . Colonoscopy N/A 10/02/2014    [id number] diverticulosis  . Esophagogastroduodenoscopy N/A 10/02/2014    [id number] ulcerative reflux/s/p dilation/large HH  . Maloney dilation N/A 10/02/2014    Procedure: Venia Minks DILATION;  Surgeon: Daneil Dolin, MD;  [facility];  Service:  Endoscopy;  Laterality: N/A;  . Esophagogastroduodenoscopy N/A 01/29/2015    Procedure: ESOPHAGOGASTRODUODENOSCOPY (EGD);  Surgeon: Daneil Dolin, MD;  Location: [facility];  Service: Endoscopy;  Laterality: N/A;  0800-rescheduled to 9/9 @ 915 [place] notified pt  . Esophageal dilation  01/29/2015    Procedure: ESOPHAGEAL DILATION;  Surgeon: Daneil Dolin, MD;  Location: [facility];  Service: Endoscopy;;  . Portacath placement Left 04/13/15   Family History  Problem Relation Age of Onset  . Colon cancer Maternal Uncle   . Heart disease Father    Social History  Substance Use Topics  . Smoking status: Former Smoker -- 1.00 packs/day for 31 years    Types: Cigarettes    Quit date: 01/22/1984  . Smokeless tobacco: Never Used     Comment: qUIT IN 1985  . Alcohol Use: No    Review of Systems  Respiratory: Positive for shortness of breath.   All other systems reviewed and are negative.     Allergies  Neulasta; Rabeprazole; and Vancomycin  Home Medications   Prior to Admission medications   Medication Sig Start Date End Date Taking? Authorizing Provider  acetaminophen (TYLENOL) 325 MG tablet Take 650 mg by mouth every 6 (six) hours as needed for mild pain or fever.   Yes Historical Provider, MD  aspirin 325 MG tablet Take 162.5 mg by mouth at bedtime.   Yes Historical Provider, MD  atorvastatin (LIPITOR) 80 MG tablet Take 1 tablet (80 mg total) by mouth daily  at 6 PM. 06/25/15  Yes Lendon Colonel, NP  Calcium Carb-Cholecalciferol (CALCIUM 500 + D3) 500-600 MG-UNIT TABS Take 2 tablets by mouth daily.   Yes Historical Provider, MD  carvedilol (COREG) 6.25 MG tablet Take 1 & 1/2 tablets two times daily 06/25/15  Yes Lendon Colonel, NP  feeding supplement (BOOST / RESOURCE BREEZE) LIQD Take 1 Container by mouth daily.   Yes Historical Provider, MD  hydrocortisone cream 1 % Apply 1 application topically 2 (two) times daily as needed for itching (skin irritation on chest).   Yes  Historical Provider, MD  lisinopril (PRINIVIL,ZESTRIL) 2.5 MG tablet Take 1 tablet (2.5 mg total) by mouth daily. 06/25/15  Yes Lendon Colonel, NP  Multiple Vitamins-Minerals (PRESERVISION AREDS 2 PO) Take 1 tablet by mouth 2 (two) times daily.   Yes Historical Provider, MD  omeprazole (PRILOSEC) 40 MG capsule Take 40 mg by mouth daily.    Yes Historical Provider, MD  ondansetron (ZOFRAN) 8 MG tablet Take 1 tablet (8 mg total) by mouth every 8 (eight) hours as needed for nausea or vomiting. 04/12/15  Yes Patrici Ranks, MD  predniSONE (DELTASONE) 20 MG tablet Take 2 tablets (40 mg total) by mouth daily. Start 07/30/15 07/29/15  Yes Francine Graven, DO   BP 95/73 mmHg  Pulse 75  Resp 16  Ht 6\' 4"  (1.93 m)  Wt 282 lb (127.914 kg)  BMI 34.34 kg/m2  SpO2 93% Physical Exam  Constitutional: He appears well-developed. He appears distressed.  HENT:  Head: Normocephalic and atraumatic.  Mouth/Throat: No oropharyngeal exudate.  Voluminous amount of frothy sputum in the oropharynx  Eyes: Conjunctivae and EOM are normal. Pupils are equal, round, and reactive to light. Right eye exhibits no discharge. Left eye exhibits no discharge. No scleral icterus.  Neck: Normal range of motion. Neck supple. No JVD present. No thyromegaly present.  Cardiovascular: Regular rhythm, normal heart sounds and intact distal pulses.  Exam reveals no gallop and no friction rub.   No murmur heard. Tachycardic, weak pulses  Pulmonary/Chest: He is in respiratory distress. He has no wheezes. He has rales.  Acute respiratory distress, tachypnea, accessory muscle use, weak inspirations, decreased air movement diffusely  Abdominal: Soft. Bowel sounds are normal. He exhibits no distension and no mass. There is no tenderness.  Nontender, nondistended, very soft  Musculoskeletal: Normal range of motion. He exhibits no edema or tenderness.  Lymphadenopathy:    He has no cervical adenopathy.  Neurological: Coordination normal.   Obtunded  Skin: Skin is warm. No rash noted. He is diaphoretic. No erythema.  Psychiatric: He has a normal mood and affect. His behavior is normal.  Nursing note and vitals reviewed.   ED Course  .Intubation Date/Time: 08/02/2015 11:09 AM Performed by: Noemi Chapel Authorized by: Noemi Chapel Consent: The procedure was performed in an emergent situation. Verbal consent obtained. Consent given by: patient Time out: Immediately prior to procedure a "time out" was called to verify the correct patient, procedure, equipment, support staff and site/side marked as required. Indications: respiratory failure and  hypoxemia Intubation method: direct Patient status: unconscious Preoxygenation: nonrebreather mask Pretreatment medications: none Laryngoscope size: Mac 4 Tube size: 8.0 mm Tube type: cuffed Number of attempts: 1 Cricoid pressure: no Cords visualized: yes Post-procedure assessment: chest rise and CO2 detector Breath sounds: equal Cuff inflated: yes Tube secured with: ETT holder Chest x-ray interpreted by me. Chest x-ray findings: endotracheal tube in appropriate position Patient tolerance: Patient tolerated the procedure well with no immediate  complications Comments: After intubation the patient's oxygenation improved significantly, he stopped being diaphoretic, his cardiac arrest resolved and he regained a normal sinus rhythm and a mild sinus tachycardia.  OG placement Performed by: Noemi Chapel Authorized by: Noemi Chapel Consent: The procedure was performed in an emergent situation. Time out: Immediately prior to procedure a "time out" was called to verify the correct patient, procedure, equipment, support staff and site/side marked as required. Patient sedated: no Patient tolerance: Patient tolerated the procedure well with no immediate complications Comments: OG placed by myself afte rintubation   (including critical care time) Labs Review Labs Reviewed  BRAIN  NATRIURETIC PEPTIDE - Abnormal; Notable for the following:    B Natriuretic Peptide 721.0 (*)    All other components within normal limits  CBC WITH DIFFERENTIAL/PLATELET - Abnormal; Notable for the following:    WBC 33.2 (*)    RBC 3.58 (*)    Hemoglobin 11.6 (*)    HCT 37.6 (*)    MCV 105.0 (*)    RDW 20.8 (*)    Platelets 37 (*)    Neutro Abs 22.9 (*)    Monocytes Absolute 6.3 (*)    All other components within normal limits  COMPREHENSIVE METABOLIC PANEL - Abnormal; Notable for the following:    Glucose, Bld 262 (*)    BUN 30 (*)    Creatinine, Ser 1.54 (*)    Calcium 8.6 (*)    Total Protein 6.4 (*)    Albumin 3.3 (*)    AST 48 (*)    Alkaline Phosphatase 172 (*)    GFR calc non Af Amer 43 (*)    GFR calc Af Amer 50 (*)    All other components within normal limits  BLOOD GAS, ARTERIAL - Abnormal; Notable for the following:    pH, Arterial 7.202 (*)    pCO2 arterial 62.7 (*)    Acid-base deficit 3.3 (*)    All other components within normal limits  I-STAT CHEM 8, ED - Abnormal; Notable for the following:    BUN 31 (*)    Creatinine, Ser 1.30 (*)    Glucose, Bld 257 (*)    All other components within normal limits  CBG MONITORING, ED - Abnormal; Notable for the following:    Glucose-Capillary 224 (*)    All other components within normal limits  I-STAT TROPOININ, ED - Abnormal; Notable for the following:    Troponin i, poc 0.18 (*)    All other components within normal limits  I-STAT CG4 LACTIC ACID, ED - Abnormal; Notable for the following:    Lactic Acid, Venous 2.29 (*)    All other components within normal limits  CULTURE, BLOOD (ROUTINE X 2)  CULTURE, BLOOD (ROUTINE X 2)    Imaging Review Dg Chest Portable 1 View  08/02/2015  CLINICAL DATA:  75 year old male with shortness breath and endotracheal tube placement. Subsequent encounter. EXAM: PORTABLE CHEST 1 VIEW COMPARISON:  07/29/2015 chest x-ray and 06/08/2015 chest CT. FINDINGS: Endotracheal tube tip 3.2  cm above the carina. Right central line tip seen to level of the distal superior vena cava. Nasogastric tube courses below the diaphragm. Tip is not included on the present exam. Diffuse asymmetric airspace disease greater on right may represent pulmonary edema with pleural effusions limiting evaluation for lower lobe infiltrate or mass. No gross pneumothorax. Cardiomegaly. IMPRESSION: Endotracheal tube tip 3.2 cm above the carina. Right central line tip seen to level of the distal superior vena cava. Nasogastric tube courses  below the diaphragm. Tip is not included on the present exam. Diffuse asymmetric airspace disease greater on right may represent pulmonary edema with pleural effusions limiting evaluation for lower lobe infiltrate or mass. Cardiomegaly. Electronically Signed   By: Genia Del M.D.   On: 08/02/2015 11:05   I have personally reviewed and evaluated these images and lab results as part of my medical decision-making.  ED ECG REPORT  I personally interpreted this EKG   Date: 08/02/2015   Rate: 101  Rhythm: sinus tachycardia  QRS Axis: normal  Intervals: normal  ST/T Wave abnormalities: nonspecific T wave changes  Conduction Disutrbances:nonspecific intraventricular conduction delay  Narrative Interpretation:   Old EKG Reviewed: none available   MDM   Final diagnoses:  Respiratory arrest ([place])  Cardiac arrest ([place])  Acute pulmonary edema ([facility])    The patient appears in severe respiratory distress, diaphoretic, on arrival the patient went into cardiac arrest, he was transitioned from BiPAP on to bag valve mask with assisted CPR which I directed. The patient went into a bradycardia arrhythmia without pulses, he was intubated successfully on one attempt without sedation, after the patient was oxygenated he regained his pulses and has return of spontaneous circulation. At this time the patient has a heart rate of approximately 110 and narrow complex rhythm without signs of  ischemia on EKG, stat chest x-ray ordered, I placed the orogastric tube, IV access was obtained both from his port in the right chest as well as an 18-gauge IV in the right antecubital fossa. His oxygenation is approximately 94% after intubation, chest x-ray pending. The patient is critically ill and will need an intensive care unit.  Review of the medical record shows that the patient was recently admitted to the hospital in January of this year with a non-ST elevation MI, no pulmonary embolism seen on CT scan, echocardiogram showed reduced ejection fraction of 40%. - Echo suggestive of Takotsubo CM.   When I look at the chest x-ray it appears that there is bilateral pulmonary infiltrates consistent with likely flash pulmonary edema. He will need Lasix, admission to the hospital, his troponin was 0.18, renal function seems to be preserved, the patient will go to the intensive care unit.  WBC severely elevated L shift CXR with bil pulm infiltrates Will d/w ICU intensivist Propofol and Versed given for sedation - pt waking up a bit and has some chewing on ETT and opening eyes - promising - needed to titrate sedation   D/w Dr. Nelda Marseille who has accepted care of pt  Cardiopulmonary Resuscitation (CPR) Procedure Note Directed/Performed by: Johnna Acosta I personally directed ancillary staff and/or performed CPR in an effort to regain return of spontaneous circulation and to maintain cardiac, neuro and systemic perfusion.   CRITICAL CARE Performed by: Johnna Acosta Total critical care time: 75 minutes Critical care time was exclusive of separately billable procedures and treating other patients. Critical care was necessary to treat or prevent imminent or life-threatening deterioration. Critical care was time spent personally by me on the following activities: development of treatment plan with patient and/or surrogate as well as nursing, discussions with consultants, evaluation of patient's response  to treatment, examination of patient, obtaining history from patient or surrogate, ordering and performing treatments and interventions, ordering and review of laboratory studies, ordering and review of radiographic studies, pulse oximetry and re-evaluation of patient's condition.     Noemi Chapel, MD 08/02/15 [phone]

## 2015-08-02 NOTE — ED Notes (Signed)
CRITICAL VALUE ALERT  Critical value received:  ABG: PH 7.202, PCo2 62.7, PO2 90.3, Bicarb 20.5, SO2 93.6  Date of notification:  08/02/15  Time of notification:  [id number]  Critical value read back:Yes.    Nurse who received alert:  RMinter, RN  MD notified (1st page):  Dr. Sabra Heck  Time of first page:  [phone]  MD notified (2nd page):  Time of second page:  Responding MD:  Dr. Sabra Heck  Time MD responded:  [phone]

## 2015-08-02 NOTE — ED Notes (Signed)
Fentanyl rate and dosage verified with [facility]. Charlestine Massed, RN.

## 2015-08-02 NOTE — ED Notes (Signed)
Lab drawing second blood culture.

## 2015-08-02 NOTE — Progress Notes (Signed)
CRITICAL VALUE ALERT  Critical value received:  Troponin  Date of notification:  08/02/15  Time of notification:  2300  Critical value read back:Yes.    Nurse who received alert:  Thurmond Butts  MD notified (1st page):  [facility]  Time of first page:  2305   Responding MD:  [facility]  Time MD responded:  2305

## 2015-08-02 NOTE — Care Management Note (Signed)
Case Management Note  Patient Details  Name: JUSTIN ALCANTAR MRN: [id number] Date of Birth: Jul 11, 1940  Subjective/Objective:      Adm w arrest, vent  Action/Plan:lives w fam, pcp dr Coletta Memos   Expected Discharge Date:                  Expected Discharge Plan:     In-House Referral:     Discharge planning Services  CM Consult  Post Acute Care Choice:    Choice offered to:     DME Arranged:    DME Agency:     HH Arranged:    [place] Agency:     Status of Service:  In process, will continue to follow  Medicare Important Message Given:    Date Medicare IM Given:    Medicare IM give by:    Date Additional Medicare IM Given:    Additional Medicare Important Message give by:     If discussed at [employer] of Stay Meetings, dates discussed:    Additional Comments: ur review done  Lacretia Leigh, RN 08/02/2015, 3:24 PM

## 2015-08-02 NOTE — Progress Notes (Signed)
Acute DVT in R and L popliteal reported to [employer] in the box.

## 2015-08-02 NOTE — Progress Notes (Signed)
ANTICOAGULATION CONSULT NOTE - Initial Consult  Pharmacy Consult for heparin Indication: DVT  Allergies  Allergen Reactions  . Neulasta [Pegfilgrastim] Hives    Pt reports breaking out in hives less than 24 hours after taking neulasta. Hives appear on arms and legs.  . Rabeprazole     Other reaction(s): DIARRHEA  . Vancomycin Hives and Rash    Patient Measurements: Height: 6\' 4"  (193 cm) Weight: 282 lb (127.914 kg) IBW/kg (Calculated) : 86.8 Heparin Dosing Weight: 114.3kg  Vital Signs: Temp: 97.4 F (36.3 C) (03/13 1530) Temp Source: Oral (03/13 1530) BP: 92/57 mmHg (03/13 1900) Pulse Rate: 65 (03/13 1900)  Labs:  Recent Labs  08/02/15 0912 08/02/15 1039 08/02/15 1040  HGB 10.7* 11.6* 13.6  HCT 34.1* 37.6* 40.0  PLT 32* 37*  --   CREATININE 1.34* 1.54* 1.30*    Estimated Creatinine Clearance: 75 mL/min (by C-G formula based on Cr of 1.3).   Assessment: 25 YOM with history of bladder cancer currently on chemotherapy and DVTs here with acute dyspnea. Found to have bilateral DVTs, to start heparin. He is not on anticoagulation PTA. Baseline Hgb 11.6, plts 37 (on chemo). No bleeding noted at baseline.  Goal of Therapy:  Heparin level 0.3-0.7 units/ml Monitor platelets by anticoagulation protocol: Yes   Plan:  -since platelets are so low, will give a small bolus of 25units/kg of heparin which = 3000 units IV x1, then start infusion at 1500 units/hr -heparin level in 8h -daily HL and CBC -follow closely for s/s bleeding -follow for long term AC plans  Nikyla Navedo D. Chelsy Parrales, PharmD, BCPS Clinical Pharmacist Pager: [phone] 08/02/2015 7:25 PM

## 2015-08-02 NOTE — H&P (Signed)
PULMONARY / CRITICAL CARE MEDICINE   Name: Harry Franklin MRN: [id number] DOB: November 04, 1940    ADMISSION DATE:  08/02/2015 CONSULTATION DATE:  08/02/15  REFERRING MD:  EDP at [place]:  Cardiac Arrest  HISTORY OF PRESENT ILLNESS:  Pt is encephelopathic; therefore, this HPI is obtained from chart review. Harry Franklin is a 75 y.o. male with PMH as outlined below including stage IV urothelial CA, currently undergoing chemo and being followed by Dr. Whitney Muse.  On 08/02/15, he went to the [facility] center for blood work and after leaving, he suddenly became very dyspneic with respiratory distress while in his car.  He called EMS who found him significantly hypoxic.  He was started on NRB which was then changed to BiPAP due to decompensation.  He was transported to ED where he later developed bradycardia followed by cardiac arrest.  He was intubated without any sedation and ACLS was performed for roughly 3 minutes before ROSC.  After he was stabilized, he was transferred to [facility] ICU for further evaluation and management.  He was admitted in Jan 2017 for NSTEMI.  At the time, he had echo which revealed EF of 40-45% and grade 2 DD.  Of note he has a hx of DVT's and is not on anticoagulation.  CTA during his admission in January was negative for PE.  PAST MEDICAL HISTORY :  He  has a past medical history of GERD (gastroesophageal reflux disease); Hypokalemia; Hypertension; Cataract; Arthritis; Macular degeneration, age related; DVT (deep venous thrombosis) ([place]); Hydrocele in adult; and Metastatic urothelial carcinoma ([place]) (04/12/2015).  PAST SURGICAL HISTORY: He  has past surgical history that includes Replacement total knee; Appendectomy; Hammer toe surgery (Left); lapidus procedure; hallux fusion; Colonoscopy (03/31/05); Colonoscopy (N/A, 10/02/2014); Esophagogastroduodenoscopy (N/A, 10/02/2014); maloney dilation (N/A, 10/02/2014); Esophagogastroduodenoscopy (N/A, 01/29/2015); Esophageal dilation  (01/29/2015); and Portacath placement (Left, 04/13/15).  Allergies  Allergen Reactions  . Neulasta [Pegfilgrastim] Hives    Pt reports breaking out in hives less than 24 hours after taking neulasta. Hives appear on arms and legs.  . Rabeprazole     Other reaction(s): DIARRHEA  . Vancomycin Hives and Rash    No current facility-administered medications on file prior to encounter.   Current Outpatient Prescriptions on File Prior to Encounter  Medication Sig  . acetaminophen (TYLENOL) 325 MG tablet Take 650 mg by mouth every 6 (six) hours as needed for mild pain or fever.  Marland Kitchen aspirin 325 MG tablet Take 162.5 mg by mouth at bedtime.  Marland Kitchen atorvastatin (LIPITOR) 80 MG tablet Take 1 tablet (80 mg total) by mouth daily at 6 PM.  . Calcium Carb-Cholecalciferol (CALCIUM 500 + D3) 500-600 MG-UNIT TABS Take 2 tablets by mouth daily.  . carvedilol (COREG) 6.25 MG tablet Take 1 & 1/2 tablets two times daily  . feeding supplement (BOOST / RESOURCE BREEZE) LIQD Take 1 Container by mouth daily.  . hydrocortisone cream 1 % Apply 1 application topically 2 (two) times daily as needed for itching (skin irritation on chest).  Marland Kitchen lisinopril (PRINIVIL,ZESTRIL) 2.5 MG tablet Take 1 tablet (2.5 mg total) by mouth daily.  . Multiple Vitamins-Minerals (PRESERVISION AREDS 2 PO) Take 1 tablet by mouth 2 (two) times daily.  Marland Kitchen omeprazole (PRILOSEC) 40 MG capsule Take 40 mg by mouth daily.   . ondansetron (ZOFRAN) 8 MG tablet Take 1 tablet (8 mg total) by mouth every 8 (eight) hours as needed for nausea or vomiting.  . predniSONE (DELTASONE) 20 MG tablet Take 2 tablets (40  mg total) by mouth daily. Start 07/30/15    FAMILY HISTORY:  His indicated that his mother is deceased. He indicated that his father is deceased. He indicated that his sister is alive. He indicated that his maternal grandmother is deceased. He indicated that his maternal grandfather is deceased. He indicated that his paternal grandmother is deceased. He  indicated that his paternal grandfather is deceased. He indicated that his daughter is alive. He indicated that his son is alive. He indicated that his maternal uncle is deceased.   SOCIAL HISTORY: He  reports that he quit smoking about 31 years ago. His smoking use included Cigarettes. He has a 31 pack-year smoking history. He has never used smokeless tobacco. He reports that he does not drink alcohol or use illicit drugs.  REVIEW OF SYSTEMS:  Unable to obtain as pt is encephalopathic.  SUBJECTIVE:  On vent, unresponsive.  VITAL SIGNS: BP 96/63 mmHg  Pulse 71  Temp(Src) 97.4 F (36.3 C) (Oral)  Resp 24  Ht 6\' 4"  (1.93 m)  Wt 127.914 kg (282 lb)  BMI 34.34 kg/m2  SpO2 99%  HEMODYNAMICS:    VENTILATOR SETTINGS: Vent Mode:  [-] PRVC FiO2 (%):  [100 %] 100 % Set Rate:  [12 bmp-15 bmp] 15 bmp Vt Set:  NQ:660337 mL] 690 mL PEEP:  [5 cmH20] 5 cmH20 Plateau Pressure:  [25 cmH20-29 cmH20] 25 cmH20  INTAKE / OUTPUT:     PHYSICAL EXAMINATION: General: Adult male, in NAD. Neuro: Sedated, non-responsive. HEENT: [place]/AT. PERRL, sclerae anicteric. ETT in place. Cardiovascular: RRR, no M/R/G.  R chest port, clean. Lungs: Respirations even and unlabored.  Coarse bilaterally. Abdomen: BS x 4, soft, NT/ND.  Musculoskeletal: No gross deformities, 1+ edema.  Skin: Intact, warm, no rashes.  LABS:  BMET  Recent Labs Lab 07/29/15 0916 08/02/15 0912 08/02/15 1039 08/02/15 1040  NA 136 139 137 140  K 4.4 4.9 4.2 4.1  CL 107 106 105 103  CO2 23 27 22   --   BUN 28* 31* 30* 31*  CREATININE 1.09 1.34* 1.54* 1.30*  GLUCOSE 150* 161* 262* 257*    Electrolytes  Recent Labs Lab 07/29/15 0916 08/02/15 0912 08/02/15 1039  CALCIUM 8.3* 8.8* 8.6*    CBC  Recent Labs Lab 07/29/15 0916 08/02/15 0912 08/02/15 1039 08/02/15 1040  WBC 8.7 17.5* 33.2*  --   HGB 12.3* 10.7* 11.6* 13.6  HCT 36.9* 34.1* 37.6* 40.0  PLT 49* 32* 37*  --     Coag's No results for input(s): APTT, INR  in the last 168 hours.  Sepsis Markers  Recent Labs Lab 08/02/15 1154  LATICACIDVEN 2.29*    ABG  Recent Labs Lab 08/02/15 1119 08/02/15 1402  PHART 7.202* 7.415  PCO2ART 62.7* 40.4  PO2ART 90.3 139*    Liver Enzymes  Recent Labs Lab 07/29/15 0916 08/02/15 0912 08/02/15 1039  AST 51* 43* 48*  ALT 43 44 44  ALKPHOS 112 149* 172*  BILITOT 1.1 1.1 1.2  ALBUMIN 2.9* 3.4* 3.3*    Cardiac Enzymes No results for input(s): TROPONINI, PROBNP in the last 168 hours.  Glucose  Recent Labs Lab 08/02/15 1036  GLUCAP 224*    Imaging Dg Chest Portable 1 View  08/02/2015  CLINICAL DATA:  75 year old male with shortness breath and endotracheal tube placement. Subsequent encounter. EXAM: PORTABLE CHEST 1 VIEW COMPARISON:  07/29/2015 chest x-ray and 06/08/2015 chest CT. FINDINGS: Endotracheal tube tip 3.2 cm above the carina. Right central line tip seen to level of the  distal superior vena cava. Nasogastric tube courses below the diaphragm. Tip is not included on the present exam. Diffuse asymmetric airspace disease greater on right may represent pulmonary edema with pleural effusions limiting evaluation for lower lobe infiltrate or mass. No gross pneumothorax. Cardiomegaly. IMPRESSION: Endotracheal tube tip 3.2 cm above the carina. Right central line tip seen to level of the distal superior vena cava. Nasogastric tube courses below the diaphragm. Tip is not included on the present exam. Diffuse asymmetric airspace disease greater on right may represent pulmonary edema with pleural effusions limiting evaluation for lower lobe infiltrate or mass. Cardiomegaly. Electronically Signed   By: Genia Del M.D.   On: 08/02/2015 11:05     STUDIES:  CXR 03/13 > bilateral airpsace disease, R > L. Echo 03/13 > LE duplex 03/13 >  CULTURES: Blood 03/13 > Urine 03/13 > Sputum 03/13 >  ANTIBIOTICS: Zyvox (vanc allergy) 03/13 > Zosyn 03/13 >  SIGNIFICANT EVENTS: 03/13 > admitted after  cardiac arrest - presumed due to hypoxia from pulmonary edema and possibly PE (has hx of DVT's and is not on anticoagulation).  LINES/TUBES: ETT 03/13 >  DISCUSSION: 75 y.o. M with hx stage IV urothelial CA (with known mets to liver and adrenals), admitted 03/13 after cardiac arrest.  Had significant hypoxia and resp distress prior and CXR shows bilateral airspace disease.  Suspect arrest due to pulmonary edema vs possible PE given his hx of DVT's for which he is not on anticoagulation.  ASSESSMENT / PLAN:  CARDIOVASCULAR A:  S/p PEA arrest - unclear etiology. 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 Troponin bump - likely demand in setting cardiac arrest. DNR Status. P:  Monitor hemodynamics. Trend troponins, lactate. Assess echo.  PULMONARY A: Acute hypoxic respiratory failure - required intubation. Pulmonary edema vs HCAP. Possible PE - of note has hx of DVT's and is not on anticoagulation. Former smoker. P:   Full vent support. Wean as able. VAP prevention measures. SBT in AM if able. Assess STAT echo, LE duplex. Assess CT chest to further evaluate bilateral airspace disease. Hold heparin for now given significant thrombocytopenia. CXR in AM.  HEMATOLOGIC / ONCOLOGIC A:   Hx stage IV urothelial CA - currently undergoing chemo and being followed by Dr. Whitney Muse. Mild anemia. Thrombocytopenia - chronic. ? PE - has hx DVT - not on anticoagulation. P:  Dr. Whitney Muse (oncologist) called by Dr. Corrie Dandy and voicemail left to call back. Transfuse for Hgb < 7. Monitor platelet counts. Defer anticoagulation for now given significant thrombocytopenia. CBC in AM.  RENAL A:   AKI. Pseudohypocalcemia - corrects to 9.16. P:   NS @ 75. BMP in AM.  GASTROINTESTINAL A: GERD. Nutrition. P:   SUP: famotidine. NPO.  INFECTIOUS A:   Concern for HCAP - has significant leukocytosis but also received neulasta in past 2 weeks. P:   Abx as  above (zyvox / zosyn).  Follow cultures as above. PCT not reliable given immunocompromised state.  ENDOCRINE A:   Hyperglycemia - no hx DM. P:   SSI. Assess Hgb A1c.  NEUROLOGIC A:   Acute metabolic encephalopathy. P:   Sedation:  Propofol gtt / fentanyl PRN. RASS goal: 0 to -1. Daily WUA. Assess CT head.   Family updated: Daughter and son.  Dr. Corrie Dandy had extensive discussions with pt's daughter and son. They discussed Mr. Rudisill history, current circumstances and organ failures. They also discussed patient's prior wishes under circumstances  such as this. The family has decided not to perform resuscitation if arrest were to occur, but to otherwise continue with current medical support / therapies.   Interdisciplinary Family Meeting v Palliative Care Meeting:  Due by: 03/20.  CC time: 35 minutes.   Montey Hora, [facility] Pager: [phone]  or [phone] 08/02/2015, 3:53 PM

## 2015-08-02 NOTE — ED Notes (Signed)
Top and Bottom dentures removed and placed in denture cup.

## 2015-08-02 NOTE — ED Notes (Signed)
Levophed given to [facility] for titration PRN.

## 2015-08-03 ENCOUNTER — Inpatient Hospital Stay (HOSPITAL_COMMUNITY): Payer: Medicare HMO

## 2015-08-03 DIAGNOSIS — R0600 Dyspnea, unspecified: Secondary | ICD-10-CM

## 2015-08-03 LAB — MAGNESIUM: Magnesium: 1.2 mg/dL — ABNORMAL LOW (ref 1.7–2.4)

## 2015-08-03 LAB — CBC
HEMATOCRIT: 30.9 % — AB (ref 39.0–52.0)
HEMOGLOBIN: 9.6 g/dL — AB (ref 13.0–17.0)
MCH: 31.8 pg (ref 26.0–34.0)
MCHC: 31.1 g/dL (ref 30.0–36.0)
MCV: 102.3 fL — ABNORMAL HIGH (ref 78.0–100.0)
Platelets: 25 10*3/uL — CL (ref 150–400)
RBC: 3.02 MIL/uL — ABNORMAL LOW (ref 4.22–5.81)
RDW: 20.3 % — ABNORMAL HIGH (ref 11.5–15.5)
WBC: 11 10*3/uL — ABNORMAL HIGH (ref 4.0–10.5)

## 2015-08-03 LAB — ECHOCARDIOGRAM COMPLETE
AORTIC ROOT 2D: 34 mm
CHL CUP LA SIZE INDEX: 1.4 mm/m2
FS: 21 % — AB (ref 28–44)
HEIGHTINCHES: 76 in
IV/PV OW: 1.24
LA VOL 2D INDEX: 34 mL/m2
LA VOL 2D: 87.5 mL
LA diam end sys: 36 mm
LASIZE: 36 mm
LAVOL: 108 mL
LAVOLIN: 42 mL/m2
LDCA: 4.15 cm2
LVIDD: 49.6 mm — AB (ref 3.5–6.0)
LVIDS: 39.2 mm — AB (ref 2.1–4.0)
LVOT diameter: 23 mm
PW: 12.2 mm — AB (ref 0.6–1.1)
PWSYS: 12.2 mm
Weight: 4543.24 oz

## 2015-08-03 LAB — GLUCOSE, CAPILLARY
GLUCOSE-CAPILLARY: 65 mg/dL (ref 65–99)
GLUCOSE-CAPILLARY: 81 mg/dL (ref 65–99)
GLUCOSE-CAPILLARY: 68 mg/dL (ref 65–99)
Glucose-Capillary: 78 mg/dL (ref 65–99)
Glucose-Capillary: 94 mg/dL (ref 65–99)

## 2015-08-03 LAB — HEPARIN LEVEL (UNFRACTIONATED)
Heparin Unfractionated: 0.27 IU/mL — ABNORMAL LOW (ref 0.30–0.70)
Heparin Unfractionated: 0.38 IU/mL (ref 0.30–0.70)

## 2015-08-03 LAB — BLOOD GAS, ARTERIAL
ACID-BASE EXCESS: 2.5 mmol/L — AB (ref 0.0–2.0)
BICARBONATE: 26.2 meq/L — AB (ref 20.0–24.0)
Drawn by: 31101
FIO2: 0.5
LHR: 13 {breaths}/min
O2 SAT: 99.1 %
PATIENT TEMPERATURE: 98.6
PCO2 ART: 37.8 mmHg (ref 35.0–45.0)
PEEP/CPAP: 5 cmH2O
PO2 ART: 129 mmHg — AB (ref 80.0–100.0)
TCO2: 27.3 mmol/L (ref 0–100)
VT: 690 mL
pH, Arterial: 7.455 — ABNORMAL HIGH (ref 7.350–7.450)

## 2015-08-03 LAB — URINE CULTURE
CULTURE: NO GROWTH
Special Requests: NORMAL

## 2015-08-03 LAB — TROPONIN I: TROPONIN I: 1.29 ng/mL — AB (ref <0.031)

## 2015-08-03 LAB — BASIC METABOLIC PANEL
ANION GAP: 8 (ref 5–15)
BUN: 27 mg/dL — ABNORMAL HIGH (ref 6–20)
CALCIUM: 8.2 mg/dL — AB (ref 8.9–10.3)
CHLORIDE: 104 mmol/L (ref 101–111)
CO2: 27 mmol/L (ref 22–32)
Creatinine, Ser: 1.34 mg/dL — ABNORMAL HIGH (ref 0.61–1.24)
GFR calc Af Amer: 59 mL/min — ABNORMAL LOW (ref >60)
GFR calc non Af Amer: 51 mL/min — ABNORMAL LOW (ref >60)
GLUCOSE: 83 mg/dL (ref 65–99)
POTASSIUM: 3.8 mmol/L (ref 3.5–5.1)
Sodium: 139 mmol/L (ref 135–145)

## 2015-08-03 LAB — PHOSPHORUS: Phosphorus: 4 mg/dL (ref 2.5–4.6)

## 2015-08-03 MED ORDER — ALBUTEROL SULFATE (2.5 MG/3ML) 0.083% IN NEBU: 2.5000 mg | INHALATION_SOLUTION | RESPIRATORY_TRACT | Status: DC | PRN

## 2015-08-03 MED ORDER — ATORVASTATIN CALCIUM 80 MG PO TABS
80.0000 mg | ORAL_TABLET | Freq: Every day | ORAL | Status: DC
  Administered 2015-08-03 – 2015-08-09 (×6): 80 mg via ORAL
  Filled 2015-08-03 (×7): qty 1

## 2015-08-03 MED ORDER — CETYLPYRIDINIUM CHLORIDE 0.05 % MT LIQD
7.0000 mL | Freq: Two times a day (BID) | OROMUCOSAL | Status: DC
  Administered 2015-08-03 – 2015-08-07 (×4): 7 mL via OROMUCOSAL

## 2015-08-03 MED ORDER — ACETAMINOPHEN 325 MG PO TABS: 650.0000 mg | ORAL_TABLET | Freq: Four times a day (QID) | ORAL | Status: DC | PRN

## 2015-08-03 MED ORDER — FUROSEMIDE 10 MG/ML IJ SOLN
40.0000 mg | Freq: Once | INTRAMUSCULAR | Status: AC
  Administered 2015-08-03: 40 mg via INTRAVENOUS
  Filled 2015-08-03: qty 4

## 2015-08-03 MED ORDER — ASPIRIN 81 MG PO CHEW
81.0000 mg | CHEWABLE_TABLET | Freq: Every day | ORAL | Status: DC
  Administered 2015-08-03 – 2015-08-09 (×7): 81 mg via ORAL
  Filled 2015-08-03 (×7): qty 1

## 2015-08-03 MED ORDER — DEXTROSE 50 % IV SOLN
INTRAVENOUS | Status: AC
Start: 2015-08-03 — End: 2015-08-03
  Administered 2015-08-03: 25 mL
  Filled 2015-08-03: qty 50

## 2015-08-03 MED ORDER — PANTOPRAZOLE SODIUM 40 MG PO TBEC
40.0000 mg | DELAYED_RELEASE_TABLET | Freq: Every day | ORAL | Status: DC
  Administered 2015-08-03 – 2015-08-09 (×6): 40 mg via ORAL
  Filled 2015-08-03 (×6): qty 1

## 2015-08-03 NOTE — Progress Notes (Signed)
CRITICAL VALUE ALERT  Critical value received:  Platelet  Date of notification:  08/03/15  Time of notification:  0500  Critical value read back:Yes.    Nurse who received alert:  Thurmond Butts  MD notified (1st page):  E-Link  Time of first page:  0510   Responding MD:  E-Link  Time MD responded:  [phone]

## 2015-08-03 NOTE — Progress Notes (Signed)
PULMONARY / CRITICAL CARE MEDICINE   Name: Harry Franklin MRN: [id number] DOB: Aug 19, 1940    ADMISSION DATE:  08/02/2015  REFERRING MD:  EDP at [place]:  Cardiac Arrest  SUBJECTIVE:  Doing well with SBT.  VITAL SIGNS: BP 96/53 mmHg  Pulse 70  Temp(Src) 98.2 F (36.8 C) (Oral)  Resp 12  Ht 6\' 4"  (1.93 m)  Wt 283 lb 15.2 oz (128.8 kg)  BMI 34.58 kg/m2  SpO2 96%  VENTILATOR SETTINGS: Vent Mode:  [-] PRVC FiO2 (%):  [50 %-100 %] 50 % Set Rate:  [12 bmp-15 bmp] 13 bmp Vt Set:  [690 mL] 690 mL PEEP:  [5 cmH20] 5 cmH20 Plateau Pressure:  [12 cmH20-29 cmH20] 12 cmH20  INTAKE / OUTPUT: I/O last 3 completed shifts: In: 1786.7 [I.V.:1266.7; NG/GT:120; IV Piggyback:400] Out: [id number] [Urine:3550]   PHYSICAL EXAMINATION: General: alert Neuro: follows commands, moves extremities HEENT: wears glasses, ETT in place Cardiac: regular, no murmur Chest: basilar crackles, no wheeze Abd: soft, non tender Ext: 2+ edema Skin: venous stasis changes  LABS:  BMET  Recent Labs Lab 08/02/15 0912 08/02/15 1039 08/02/15 1040 08/03/15 0318  NA 139 137 140 139  K 4.9 4.2 4.1 3.8  CL 106 105 103 104  CO2 27 22  --  27  BUN 31* 30* 31* 27*  CREATININE 1.34* 1.54* 1.30* 1.34*  GLUCOSE 161* 262* 257* 83    Electrolytes  Recent Labs Lab 08/02/15 0912 08/02/15 1039 08/02/15 2211 08/03/15 0318  CALCIUM 8.8* 8.6*  --  8.2*  MG  --   --  1.2* 1.2*  PHOS  --   --  4.0 4.0    CBC  Recent Labs Lab 08/02/15 0912 08/02/15 1039 08/02/15 1040 08/03/15 0319  WBC 17.5* 33.2*  --  11.0*  HGB 10.7* 11.6* 13.6 9.6*  HCT 34.1* 37.6* 40.0 30.9*  PLT 32* 37*  --  25*    Coag's  Recent Labs Lab 08/02/15 2211  APTT 106*  INR 1.51*    Sepsis Markers  Recent Labs Lab 08/02/15 1154 08/02/15 2215  LATICACIDVEN 2.29* 1.4    ABG  Recent Labs Lab 08/02/15 1119 08/02/15 1402 08/03/15 0339  PHART 7.202* 7.415 7.455*  PCO2ART 62.7* 40.4 37.8  PO2ART 90.3  139* 129*    Liver Enzymes  Recent Labs Lab 07/29/15 0916 08/02/15 0912 08/02/15 1039  AST 51* 43* 48*  ALT 43 44 44  ALKPHOS 112 149* 172*  BILITOT 1.1 1.1 1.2  ALBUMIN 2.9* 3.4* 3.3*    Cardiac Enzymes  Recent Labs Lab 08/02/15 2211 08/03/15 0318  TROPONINI 0.69* 1.29*    Glucose  Recent Labs Lab 08/02/15 1036 08/02/15 1540 08/02/15 2027 08/02/15 2318 08/03/15 0348  GLUCAP 224* 99 77 80 78    Imaging Ct Head Wo Contrast  08/02/2015  CLINICAL DATA:  Altered mental status/ cardiac arrest EXAM: CT HEAD WITHOUT CONTRAST TECHNIQUE: Contiguous axial images were obtained from the base of the skull through the vertex without intravenous contrast. COMPARISON:  None. FINDINGS: There is age related volume loss. There is no intracranial mass, hemorrhage, extra-axial fluid collection, or midline shift. There is slight small vessel disease in the centra semiovale bilaterally. There is preservation of the gray -white differentiation. There is subtle decreased attenuation in the mid to lower pons on the left. No other evidence of potential acute infarct. Bony calvarium appears intact. Mastoid air cells are clear. There is mucosal thickening in several ethmoid air cells bilaterally. No intraorbital  lesions are identified. IMPRESSION: No brain edema is evident. No hemorrhage. There is subtle decreased attenuation in the medial left mid to lower pons. This finding may be artifactual. However, a subtle early infarct in the left pons must be of concern. No other findings suggesting potential acute infarct evident on this study. There is slight small vessel disease in the centra semiovale bilaterally. There are areas of ethmoid sinus disease bilaterally. Electronically Signed   By: Lowella Grip III M.D.   On: 08/02/2015 17:26   Ct Chest Wo Contrast  08/02/2015  CLINICAL DATA:  Follow-up after cardiac arrest. History of hypertension. EXAM: CT CHEST WITHOUT CONTRAST TECHNIQUE: Multidetector  CT imaging of the chest was performed following the standard protocol without IV contrast. COMPARISON:  Chest CT dated 06/08/2015. FINDINGS: Mediastinum/Lymph Nodes: Endotracheal tube well positioned with tip above the level of the carina. Enteric tube passes into the stomach. Right subclavian central line appears well positioned. Mild cardiomegaly is unchanged. Trace pericardial effusion again noted at the heart base. Diffuse coronary artery calcifications again noted. Scattered atherosclerotic changes are seen along the walls of the normal-caliber thoracic aorta. No mass or enlarged lymph nodes seen within the mediastinum. Lungs/Pleura: Bilateral pleural effusions, moderate to large in size, increased compared to the previous study. Additional patchy consolidations throughout both lungs, most likely atelectasis. Mild emphysematous change noted at each lung apex. Upper abdomen: Enteric tube in the stomach. Limited images of the upper abdomen are otherwise unremarkable. Musculoskeletal: No chest wall mass or suspicious bone lesions identified. Degenerative changes throughout the thoracic spine, moderate in degree. IMPRESSION: 1. Bilateral pleural effusions, moderate to large in size, increased compared to the previous study. Additional patchy consolidations scattered within both lungs, most likely atelectasis, less likely pneumonia or aspiration. 2. Cardiomegaly, stable. Diffuse coronary artery calcifications again noted. Trace pericardial effusion at the heart base appears stable. 3. Tubes and lines appear adequately positioned, as detailed above. Electronically Signed   By: Franki Cabot M.D.   On: 08/02/2015 17:35   Dg Chest Portable 1 View  08/02/2015  CLINICAL DATA:  74 year old male with shortness breath and endotracheal tube placement. Subsequent encounter. EXAM: PORTABLE CHEST 1 VIEW COMPARISON:  07/29/2015 chest x-ray and 06/08/2015 chest CT. FINDINGS: Endotracheal tube tip 3.2 cm above the carina. Right  central line tip seen to level of the distal superior vena cava. Nasogastric tube courses below the diaphragm. Tip is not included on the present exam. Diffuse asymmetric airspace disease greater on right may represent pulmonary edema with pleural effusions limiting evaluation for lower lobe infiltrate or mass. No gross pneumothorax. Cardiomegaly. IMPRESSION: Endotracheal tube tip 3.2 cm above the carina. Right central line tip seen to level of the distal superior vena cava. Nasogastric tube courses below the diaphragm. Tip is not included on the present exam. Diffuse asymmetric airspace disease greater on right may represent pulmonary edema with pleural effusions limiting evaluation for lower lobe infiltrate or mass. Cardiomegaly. Electronically Signed   By: Genia Del M.D.   On: 08/02/2015 11:05     STUDIES:  LE duplex 03/13 > Acute DVT Lt femoral and popliteal vein, Acute DVT Rt popliteal vein, chronic thrombus mid femoral vein CT head 3/13 >> ?early infarct Lt pons versus artifact CT chest 3/13 > b/l effusions, ATX, mild emphysema Echo 3/13 >   CULTURES: Blood 03/13 > Urine 03/13 > Sputum 03/13 >  ANTIBIOTICS: Zyvox (vanc allergy) 03/13 > 3/14 Zosyn 03/13 > 3/14  SIGNIFICANT EVENTS: 03/13 > admitted after cardiac arrest -  presumed due to hypoxia from pulmonary edema and possibly PE (has hx of DVT's and is not on anticoagulation).  LINES/TUBES: Port Rt chest 04/13/15 >  ETT 03/13 >  DISCUSSION: 75 yo male with respiratory leading to cardiac arrest.  Found to have acute b/l DVT and presumed to have PE.  He has hx of DVT and was not on anticoagulation due pancytopenia.  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  He also has hx of CAD, systolic CHF, A fib.  ASSESSMENT / PLAN:  CARDIOVASCULAR A: PEA cardiac arrest likely 2nd to acute PE with acute b/l DVT. Chronic systolic CHF, CAD, Hx of HTN, HLD. P:  Heparin gtt ASA,  lipitor F/u Echo Hold coreg, linisopril for now  PULMONARY A: Acute hypoxic respiratory failure from acute PE, pulmonary edema with pleural effusions. P:   Proceed with extubation Follow up CXR  HEMATOLOGIC / ONCOLOGIC A:   Stage 4 urothelial cancer. Chemotherapy induced pancytopenia. P:  F/u CBC  RENAL A:   CKD stage 2. P:   Monitor renal fx, urine outpt  GASTROINTESTINAL A: Hx of GERD. P:   Protonix for SUP Advance diet after extubation  INFECTIOUS A:   Initial concern for HCAP >> less likely now. P:   D/c Abx 3/14  ENDOCRINE A:   Hyperglycemia - no hx DM > improved. P:   D/c SSI F/u HbA1C  NEUROLOGIC A:   Acute metabolic encephalopathy >> resolved. P:   Limit sedation while weaning vent   Goals of care >> DNR.  Updated pt's family at bedside.  CC time 32 minutes.  Chesley Mires, MD [facility] 08/03/2015, 8:37 AM Pager:  [phone] After 3pm call: [phone]

## 2015-08-03 NOTE — Progress Notes (Signed)
ANTICOAGULATION CONSULT NOTE - Follow Up Consult  Pharmacy Consult for heparin Indication: DVT   Labs:  Recent Labs  08/02/15 0912 08/02/15 1039 08/02/15 1040 08/02/15 2211 08/03/15 0319  HGB 10.7* 11.6* 13.6  --  9.6*  HCT 34.1* 37.6* 40.0  --  30.9*  PLT 32* 37*  --   --  PENDING  APTT  --   --   --  106*  --   LABPROT  --   --   --  18.3*  --   INR  --   --   --  1.51*  --   HEPARINUNFRC  --   --   --   --  0.27*  CREATININE 1.34* 1.54* 1.30*  --   --   TROPONINI  --   --   --  0.69*  --      Assessment: 74yo male slightly subtherapeutic on heparin with initial dosing for DVT; known chronic thrombocytopenia (on chemo), this am's Plt since pending; Hgb down but in line w/ historical CBCs, no signs of bleeding per RN.  Goal of Therapy:  Heparin level 0.3-0.7 units/ml   Plan:  Will increase heparin gtt cautiously to 1600 units/hr and check level in 6hr.  Wynona Neat, PharmD, BCPS  08/03/2015,3:53 AM

## 2015-08-03 NOTE — Progress Notes (Signed)
ANTICOAGULATION CONSULT NOTE - Initial Consult  Pharmacy Consult for heparin Indication: DVT  Allergies  Allergen Reactions  . Neulasta [Pegfilgrastim] Hives    Pt reports breaking out in hives less than 24 hours after taking neulasta. Hives appear on arms and legs.  . Rabeprazole     Other reaction(s): DIARRHEA  . Vancomycin Hives and Rash    Patient Measurements: Height: 6\' 4"  (193 cm) Weight: 283 lb 15.2 oz (128.8 kg) IBW/kg (Calculated) : 86.8 Heparin Dosing Weight: 114.3kg  Vital Signs: Temp: 97.8 F (36.6 C) (03/14 0800) Temp Source: Oral (03/14 0800) BP: 104/68 mmHg (03/14 0845) Pulse Rate: 91 (03/14 0845)  Labs:  Recent Labs  08/02/15 0912 08/02/15 1039 08/02/15 1040 08/02/15 2211 08/03/15 0318 08/03/15 0319 08/03/15 0943  HGB 10.7* 11.6* 13.6  --   --  9.6*  --   HCT 34.1* 37.6* 40.0  --   --  30.9*  --   PLT 32* 37*  --   --   --  25*  --   APTT  --   --   --  106*  --   --   --   LABPROT  --   --   --  18.3*  --   --   --   INR  --   --   --  1.51*  --   --   --   HEPARINUNFRC  --   --   --   --   --  0.27* 0.38  CREATININE 1.34* 1.54* 1.30*  --  1.34*  --   --   TROPONINI  --   --   --  0.69* 1.29*  --   --     Estimated Creatinine Clearance: 73 mL/min (by C-G formula based on Cr of 1.34).   Assessment: 3 YOM with history of bladder cancer currently on chemotherapy and here with acute dyspnea. Found to have bilateral DVTs on heparin (also with history of DVT not on anticoagulation due to low platelets. -HL= 0.38 and at goal, Hg= 9.6, plt= 25  Goal of Therapy:  Heparin level 0.3-0.7 units/ml Monitor platelets by anticoagulation protocol: Yes   Plan:  -No heparin changes needed -daily HL and CBC -follow for long term AC plans  Hildred Laser, Pharm D 08/03/2015 11:03 AM

## 2015-08-03 NOTE — Progress Notes (Signed)
250 ml fentanyl wasted in sink with Zenovia Jordan, RN Zenovia Jordan, RN

## 2015-08-03 NOTE — Procedures (Signed)
Extubation Procedure Note  Patient Details:   Name: Harry Franklin DOB: 27-May-1940 MRN: [id number]   Airway Documentation:     Evaluation  O2 sats: stable throughout Complications: No apparent complications Patient did tolerate procedure well. Bilateral Breath Sounds: Clear, Diminished Suctioning: Oral, Airway Yes   Positive cuff leak noted prior to extubation. No stridor. Pt placed on [place] 08/03/2015, 9:04 AM

## 2015-08-03 NOTE — Progress Notes (Signed)
Echocardiogram 2D Echocardiogram has been performed.  Harry Franklin 08/03/2015, 10:22 AM

## 2015-08-03 NOTE — Progress Notes (Signed)
100 ml fentanyl wasted in sink with Ranell Patrick, RN

## 2015-08-04 ENCOUNTER — Inpatient Hospital Stay (HOSPITAL_COMMUNITY): Payer: Medicare HMO

## 2015-08-04 ENCOUNTER — Ambulatory Visit (HOSPITAL_COMMUNITY): Payer: Medicare HMO

## 2015-08-04 ENCOUNTER — Other Ambulatory Visit (HOSPITAL_COMMUNITY): Payer: Medicare HMO

## 2015-08-04 DIAGNOSIS — I5023 Acute on chronic systolic (congestive) heart failure: Secondary | ICD-10-CM

## 2015-08-04 DIAGNOSIS — I469 Cardiac arrest, cause unspecified: Secondary | ICD-10-CM

## 2015-08-04 DIAGNOSIS — I214 Non-ST elevation (NSTEMI) myocardial infarction: Secondary | ICD-10-CM

## 2015-08-04 LAB — BASIC METABOLIC PANEL
ANION GAP: 11 (ref 5–15)
BUN: 26 mg/dL — ABNORMAL HIGH (ref 6–20)
CALCIUM: 7.9 mg/dL — AB (ref 8.9–10.3)
CHLORIDE: 101 mmol/L (ref 101–111)
CO2: 28 mmol/L (ref 22–32)
Creatinine, Ser: 1.62 mg/dL — ABNORMAL HIGH (ref 0.61–1.24)
GFR calc non Af Amer: 41 mL/min — ABNORMAL LOW (ref >60)
GFR, EST AFRICAN AMERICAN: 47 mL/min — AB (ref >60)
Glucose, Bld: 85 mg/dL (ref 65–99)
POTASSIUM: 3.7 mmol/L (ref 3.5–5.1)
Sodium: 140 mmol/L (ref 135–145)

## 2015-08-04 LAB — HEPARIN LEVEL (UNFRACTIONATED): HEPARIN UNFRACTIONATED: 0.3 [IU]/mL (ref 0.30–0.70)

## 2015-08-04 LAB — CBC
HEMATOCRIT: 31.5 % — AB (ref 39.0–52.0)
HEMOGLOBIN: 9.8 g/dL — AB (ref 13.0–17.0)
MCH: 31.5 pg (ref 26.0–34.0)
MCHC: 31.1 g/dL (ref 30.0–36.0)
MCV: 101.3 fL — AB (ref 78.0–100.0)
Platelets: 32 10*3/uL — ABNORMAL LOW (ref 150–400)
RBC: 3.11 MIL/uL — AB (ref 4.22–5.81)
RDW: 21.2 % — ABNORMAL HIGH (ref 11.5–15.5)
WBC: 11.2 10*3/uL — AB (ref 4.0–10.5)

## 2015-08-04 LAB — HEMOGLOBIN A1C
HEMOGLOBIN A1C: 5.6 % (ref 4.8–5.6)
MEAN PLASMA GLUCOSE: 114 mg/dL

## 2015-08-04 MED ORDER — CARVEDILOL 3.125 MG PO TABS
3.1250 mg | ORAL_TABLET | Freq: Two times a day (BID) | ORAL | Status: DC
  Administered 2015-08-04 – 2015-08-09 (×10): 3.125 mg via ORAL
  Filled 2015-08-04 (×11): qty 1

## 2015-08-04 MED ORDER — FUROSEMIDE 10 MG/ML IJ SOLN
20.0000 mg | Freq: Two times a day (BID) | INTRAMUSCULAR | Status: DC
  Administered 2015-08-04 – 2015-08-06 (×4): 20 mg via INTRAVENOUS
  Filled 2015-08-04 (×4): qty 2

## 2015-08-04 MED ORDER — SODIUM CHLORIDE 0.9% FLUSH
10.0000 mL | Freq: Two times a day (BID) | INTRAVENOUS | Status: DC
  Administered 2015-08-04 – 2015-08-06 (×4): 10 mL

## 2015-08-04 MED FILL — Medication: Qty: 1 | Status: AC

## 2015-08-04 NOTE — Progress Notes (Signed)
[place] for heparin Indication: DVT  Allergies  Allergen Reactions  . Neulasta [Pegfilgrastim] Hives    Pt reports breaking out in hives less than 24 hours after taking neulasta. Hives appear on arms and legs.  . Rabeprazole     Other reaction(s): DIARRHEA  . Vancomycin Hives and Rash    Patient Measurements: Height: 6\' 4"  (193 cm) Weight: 274 lb 11.1 oz (124.6 kg) IBW/kg (Calculated) : 86.8 Heparin Dosing Weight: 114.3kg  Vital Signs: Temp: 98.6 F (37 C) (03/15 0800) Temp Source: Oral (03/15 0800) BP: 130/71 mmHg (03/15 0800) Pulse Rate: 87 (03/15 0800)  Labs:  Recent Labs  08/02/15 1039 08/02/15 1040 08/02/15 2211 08/03/15 0318 08/03/15 0319 08/03/15 0943 08/04/15 0416 08/04/15 0417  HGB 11.6* 13.6  --   --  9.6*  --   --  9.8*  HCT 37.6* 40.0  --   --  30.9*  --   --  31.5*  PLT 37*  --   --   --  25*  --   --  32*  APTT  --   --  106*  --   --   --   --   --   LABPROT  --   --  18.3*  --   --   --   --   --   INR  --   --  1.51*  --   --   --   --   --   HEPARINUNFRC  --   --   --   --  0.27* 0.38 0.30  --   CREATININE 1.54* 1.30*  --  1.34*  --   --   --  1.62*  TROPONINI  --   --  0.69* 1.29*  --   --   --   --     Estimated Creatinine Clearance: 59.4 mL/min (by C-G formula based on Cr of 1.62).   Assessment: Harry Franklin with history of bladder cancer currently on chemotherapy and here with acute dyspnea. Found to have bilateral DVTs and presumed PE on heparin (also with history of DVT not on anticoagulation due to low platelets. -HL= 0.30 and at goal, Hg= 9.8, plt= 32  Goal of Therapy:  Heparin level 0.3-0.7 units/ml Monitor platelets by anticoagulation protocol: Yes   Plan:  -Increase heparin to 1700 units/hr -Daily HL and CBC>>follow plt count closely -Follow for long term [facility] plans  Erin Hearing PharmD., BCPS Clinical Pharmacist Pager [phone] 08/04/2015 8:38 AM

## 2015-08-04 NOTE — Progress Notes (Signed)
[facility] called to give report. Nurse unavailable at this time.

## 2015-08-04 NOTE — Progress Notes (Signed)
[facility] called again. RN still unavailable to receive report.

## 2015-08-04 NOTE — Progress Notes (Signed)
PULMONARY / CRITICAL CARE MEDICINE   Name: Harry Franklin MRN: [id number] DOB: 1941/04/26    ADMISSION DATE:  08/02/2015  REFERRING MD:  EDP at [place]:  Cardiac Arrest  SUBJECTIVE:  Breathing better.  VITAL SIGNS: BP 123/82 mmHg  Pulse 98  Temp(Src) 98.6 F (37 C) (Oral)  Resp 23  Ht 6\' 4"  (1.93 m)  Wt 274 lb 11.1 oz (124.6 kg)  BMI 33.45 kg/m2  SpO2 99%  INTAKE / OUTPUT: I/O last 3 completed shifts: In: 2325.4 [P.O.:120; I.V.:1725.4; NG/GT:80; IV Piggyback:400] Out: 4525 [Urine:4525]   PHYSICAL EXAMINATION: General: alert, sitting in chair Neuro: follows commands, moves extremities HEENT: wears glasses, no stridor Cardiac: regular, no murmur Chest: better air movement Abd: soft, non tender Ext: 1+ edema Skin: venous stasis changes  LABS:  BMET  Recent Labs Lab 08/02/15 1039 08/02/15 1040 08/03/15 0318 08/04/15 0417  NA 137 140 139 140  K 4.2 4.1 3.8 3.7  CL 105 103 104 101  CO2 22  --  27 28  BUN 30* 31* 27* 26*  CREATININE 1.54* 1.30* 1.34* 1.62*  GLUCOSE 262* 257* 83 85    Electrolytes  Recent Labs Lab 08/02/15 1039 08/02/15 2211 08/03/15 0318 08/04/15 0417  CALCIUM 8.6*  --  8.2* 7.9*  MG  --  1.2* 1.2*  --   PHOS  --  4.0 4.0  --     CBC  Recent Labs Lab 08/02/15 1039 08/02/15 1040 08/03/15 0319 08/04/15 0417  WBC 33.2*  --  11.0* 11.2*  HGB 11.6* 13.6 9.6* 9.8*  HCT 37.6* 40.0 30.9* 31.5*  PLT 37*  --  25* 32*    Coag's  Recent Labs Lab 08/02/15 2211  APTT 106*  INR 1.51*    Sepsis Markers  Recent Labs Lab 08/02/15 1154 08/02/15 2215  LATICACIDVEN 2.29* 1.4    ABG  Recent Labs Lab 08/02/15 1119 08/02/15 1402 08/03/15 0339  PHART 7.202* 7.415 7.455*  PCO2ART 62.7* 40.4 37.8  PO2ART 90.3 139* 129*    Liver Enzymes  Recent Labs Lab 07/29/15 0916 08/02/15 0912 08/02/15 1039  AST 51* 43* 48*  ALT 43 44 44  ALKPHOS 112 149* 172*  BILITOT 1.1 1.1 1.2  ALBUMIN 2.9* 3.4* 3.3*     Cardiac Enzymes  Recent Labs Lab 08/02/15 2211 08/03/15 0318  TROPONINI 0.69* 1.29*    Glucose  Recent Labs Lab 08/02/15 2318 08/03/15 0348 08/03/15 0823 08/03/15 0926 08/03/15 1149 08/03/15 1730  GLUCAP 80 78 65 81 68 94    Imaging Dg Chest Port 1 View  08/04/2015  CLINICAL DATA:  Respiratory failure/ shortness of breath EXAM: PORTABLE CHEST 1 VIEW COMPARISON:  August 03, 2015 FINDINGS: Endotracheal tube and nasogastric tube have been removed. Port-A-Cath tip is in the superior vena cava. No pneumothorax. There is atelectatic change in the medial left base. The lungs elsewhere clear. Heart remains mildly enlarged with pulmonary vascularity within normal limits. No adenopathy. There is degenerative change in each shoulder. IMPRESSION: No pneumothorax. No frank edema or consolidation. Stable cardiac prominence. Electronically Signed   By: Lowella Grip III M.D.   On: 08/04/2015 07:24     STUDIES:  LE duplex 03/13 > Acute DVT Lt femoral and popliteal vein, Acute DVT Rt popliteal vein, chronic thrombus mid femoral vein CT head 3/13 >> ?early infarct Lt pons versus artifact CT chest 3/13 > b/l effusions, ATX, mild emphysema Echo 3/14 > EF 25 to 30%  CULTURES: Blood 03/13 > Urine 03/13 >  negative Sputum 03/13 >  ANTIBIOTICS: Zyvox (vanc allergy) 03/13 > 3/14 Zosyn 03/13 > 3/14  SIGNIFICANT EVENTS: 3/13 Respiratory leading to cardiac arrest 3/15 Cardiology consulted, transfer to tele  LINES/TUBES: Port Rt chest 04/13/15 >  ETT 03/13 > 3/14  DISCUSSION: 75 yo male with respiratory leading to cardiac arrest.  Found to have acute b/l DVT and presumed to have PE.  He has hx of DVT and was not on anticoagulation due pancytopenia.  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  He also has hx of CAD, systolic CHF, A fib.  ASSESSMENT / PLAN:  CARDIOVASCULAR A: PEA cardiac arrest likely 2nd to presumed acute PE with  acute b/l DVT. Acute on chronic systolic CHF, CAD, Hx of HTN, HLD. P:  Heparin gtt >> likely transition to coumadin if his blood counts stay up; if PLT drops or he has bleeding, then will need IVC filter ASA, lipitor Cardiology consulted 3/15 Hold coreg, linisopril for now >> defer to cardiology  PULMONARY A: Acute hypoxic respiratory failure from acute PE, pulmonary edema with pleural effusions >> resolved. P:   Oxygen to keep O2 > 92% Follow up CXR as needed  HEMATOLOGIC / ONCOLOGIC A:   Stage 4 urothelial cancer >> plan was to transition off chemo to  Chemotherapy induced pancytopenia. P:  F/u CBC  RENAL A:   CKD stage 2. P:   Monitor renal fx, urine outpt  GASTROINTESTINAL A: Hx of GERD. P:   Protonix for SUP Heart healthy diet  INFECTIOUS A:   Initial concern for HCAP >> less likely now >> Abx d/c'ed 3/14. P:   Monitor off Abx  ENDOCRINE A:   Hyperglycemia - no hx DM > improved; Hb A1C 5.6 from 08/02/15. P:   Monitor blood sugar on BMET  NEUROLOGIC A:   Acute metabolic encephalopathy >> resolved. P:   Monitor mental status   Goals of care >> He wants to live, and would be okay with therapies to keep him going.  If his quality of life is severely impacted, then he would not want prolonged therapies.  Changed to full code status.  Updated pt's family at bedside.  Transfer to tele 3/15 >> to triad 3/16 and PCCM off.  Chesley Mires, MD [facility] Pulmonary/Critical Care 08/04/2015, 10:21 AM Pager:  [phone] After 3pm call: [phone]

## 2015-08-04 NOTE — Consult Note (Addendum)
CARDIOLOGY CONSULT NOTE   Patient ID: BEAR GALA MRN: [id number], DOB/AGE: [birth year]   Admit date: 08/02/2015 Date of Consult: 08/04/2015  Primary Physician: [name], MD Primary Cardiologist: Dr Harl Bowie  Reason for consult:  Cardiac arrest, CHF  Problem List  Past Medical History  Diagnosis Date  . GERD (gastroesophageal reflux disease)   . Hypokalemia   . Hypertension   . Cataract   . Arthritis   . Macular degeneration, age related   . DVT (deep venous thrombosis) ([place])     developed after traveling x 1, developed another after left foot surgery  . Hydrocele in adult   . Metastatic urothelial carcinoma ([place]) 04/12/2015    Past Surgical History  Procedure Laterality Date  . Replacement total knee    . Appendectomy    . Hammer toe surgery Left   . Lapidus procedure    . Hallux fusion    . Colonoscopy  03/31/05  . Colonoscopy N/A 10/02/2014    [id number] diverticulosis  . Esophagogastroduodenoscopy N/A 10/02/2014    [id number] ulcerative reflux/s/p dilation/large HH  . Maloney dilation N/A 10/02/2014    Procedure: Venia Minks DILATION;  Surgeon: Daneil Dolin, MD;  [facility];  Service: Endoscopy;  Laterality: N/A;  . Esophagogastroduodenoscopy N/A 01/29/2015    Procedure: ESOPHAGOGASTRODUODENOSCOPY (EGD);  Surgeon: Daneil Dolin, MD;  Location: [facility];  Service: Endoscopy;  Laterality: N/A;  0800-rescheduled to 9/9 @ 915 [place] notified pt  . Esophageal dilation  01/29/2015    Procedure: ESOPHAGEAL DILATION;  Surgeon: Daneil Dolin, MD;  Location: [facility];  Service: Endoscopy;;  . Portacath placement Left 04/13/15    Allergies  Allergies  Allergen Reactions  . Neulasta [Pegfilgrastim] Hives    Pt reports breaking out in hives less than 24 hours after taking neulasta. Hives appear on arms and legs.  . Rabeprazole     Other reaction(s): DIARRHEA  . Vancomycin Hives and Rash   HPI   75 year old male with bladder ca,  carboplatin/gemcitabine, CAD, s/p NSTEMI in Jan 2017 with NSTEMI with peak trop 8.68, no EKG changes. Echo with LVEF 40-45% with WMAs primarily involving the apex. No cardiopulmonary symptoms. 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 Given his diffusely metastatic bladder cancer and severe pancytopenia due to recent chemotherapy he was managed medically. No ASA or plavix due to significant thrombocytopenia, the patient had a follow up [place] nuclear stres test on 07/06/15 that showed prior myocardial infarction with minimal peri-infarct ischemia in the apex and in the septal/inferoseptal/inferior wall territories. Both defects are primarily scar with fairly mild peri-infarct ischemia. She was also diagnosed with a new a-fib, nosis during recent admit, was not anticoagulated due to severe pancytopenia and metastatic CA.   She was readmitted yesterday with progressively worsening SOB. She was treated with Neulasta and developed an allergic reaction treated with prednisone and benadryl. She woke up with SOB, went to the ER where she developed bradycardia and cardiac arrest. Revived after 3 minutes.  He was diuresed and now feels better.  Inpatient Medications  . antiseptic oral rinse  7 mL Mouth Rinse BID  . aspirin  81 mg Oral Daily  . atorvastatin  80 mg Oral q1800  . pantoprazole  40 mg Oral Q1200  . sodium chloride flush  10-40 mL Intracatheter Q12H    Family History Family History  Problem Relation Age of Onset  . Colon cancer Maternal Uncle   . Heart disease Father  Social History Social History   Social History  . Marital Status: Married    Spouse Name: N/A  . Number of Children: N/A  . Years of Education: N/A   Occupational History  . Not on file.   Social History Main Topics  . Smoking status: Former Smoker -- 1.00 packs/day for 31 years    Types: Cigarettes    Quit date: 01/22/1984  . Smokeless tobacco: Never Used     Comment: qUIT IN 1985  . Alcohol Use: No  . Drug Use:  No  . Sexual Activity: Not on file   Other Topics Concern  . Not on file   Social History Narrative     Review of Systems  General:  No chills, fever, night sweats or weight changes.  Cardiovascular:  No chest pain, dyspnea on exertion, edema, orthopnea, palpitations, paroxysmal nocturnal dyspnea. Dermatological: No rash, lesions/masses Respiratory: No cough, dyspnea Urologic: No hematuria, dysuria Abdominal:   No nausea, vomiting, diarrhea, bright red blood per rectum, melena, or hematemesis Neurologic:  No visual changes, wkns, changes in mental status. All other systems reviewed and are otherwise negative except as noted above.  Physical Exam  Blood pressure 114/71, pulse 88, temperature 98.6 F (37 C), temperature source Oral, resp. rate 16, height 6\' 4"  (1.93 m), weight 274 lb 11.1 oz (124.6 kg), SpO2 97 %.  General: Pleasant, NAD Psych: Normal affect. Neuro: Alert and oriented X 3. Moves all extremities spontaneously. HEENT: Normal  Neck: Supple without bruits or JVD. Lungs:  Resp regular and unlabored, mild crackles at the basis. Heart: RRR no s3, s4, 2/6 systolic murmur Abdomen: Soft, non-tender, non-distended, BS + x 4.  Extremities: No clubbing, cyanosis, B/L LE edema. DP/PT/Radials 2+ and equal bilaterally.  Labs  Recent Labs  08/02/15 2211 08/03/15 0318  TROPONINI 0.69* 1.29*   Lab Results  Component Value Date   WBC 11.2* 08/04/2015   HGB 9.8* 08/04/2015   HCT 31.5* 08/04/2015   MCV 101.3* 08/04/2015   PLT 32* 08/04/2015    Recent Labs Lab 08/02/15 1039  08/04/15 0417  NA 137  < > 140  K 4.2  < > 3.7  CL 105  < > 101  CO2 22  < > 28  BUN 30*  < > 26*  CREATININE 1.54*  < > 1.62*  CALCIUM 8.6*  < > 7.9*  PROT 6.4*  --   --   BILITOT 1.2  --   --   ALKPHOS 172*  --   --   ALT 44  --   --   AST 48*  --   --   GLUCOSE 262*  < > 85  < > = values in this interval not displayed. Lab Results  Component Value Date   TRIG 49 08/02/2015   Ct  Chest Wo Contrast  08/02/2015  CLINICAL DATA:  Follow-up after cardiac arrest. History of hypertension.  IMPRESSION: 1. Bilateral pleural effusions, moderate to large in size, increased compared to the previous study. Additional patchy consolidations scattered within both lungs, most likely atelectasis, less likely pneumonia or aspiration. 2. Cardiomegaly, stable. Diffuse coronary artery calcifications again noted. Trace pericardial effusion at the heart base appears stable. 3. Tubes and lines appear adequately positioned, as detailed above.   Nm Myocar Multi W/spect W/wall Motion / Ef  07/06/2015   There was no ST segment deviation noted during stress.  Findings consistent with prior myocardial infarction with peri-infarct ischemia in the apex and in the septal/inferoseptal/inferior wall  territories. Both defects are primarily scar with fairly mild peri-infarct ischemia.  This is a high risk study. High risk based on low ejection fraction and multiple defects. There is a fairly minimal amount of myocardium currently at jeopardy.  The left ventricular ejection fraction is moderately decreased (30-44%).    Dg Chest Port 1 View  08/04/2015  CLINICAL DATA:  Respiratory failure/ shortness of breath EXAM: PORTABLE CHEST 1 VIEW COMPARISON:  August 03, 2015 FINDINGS: Endotracheal tube and nasogastric tube have been removed. Port-A-Cath tip is in the superior vena cava. No pneumothorax. There is atelectatic change in the medial left base. The lungs elsewhere clear. Heart remains mildly enlarged with pulmonary vascularity within normal limits. No adenopathy. There is degenerative change in each shoulder. IMPRESSION: No pneumothorax. No frank edema or consolidation. Stable cardiac prominence. Electronically Signed   By: Lowella Grip III M.D.   On: 08/04/2015 07:24   Dg Chest Port 1 View  08/03/2015  CLINICAL DATA:  Acute respiratory failure. EXAM: PORTABLE CHEST 1 VIEW COMPARISON:  08/02/2015 FINDINGS:  Endotracheal tube remains with the tip approximately 2 cm above the carina. Gastric decompression tube extends below the diaphragm. Port-A-Cath shows stable positioning. Temporary pacing pads present. Lungs show improved aeration bilaterally and decrease in pulmonary edema with suggestion of some residual mild interstitial edema/ CHF and potential bilateral pleural effusions. Atelectasis versus consolidation noted in both lower lobes. IMPRESSION: Decreasing pulmonary edema with improved bilateral aeration. Suggestion of mild remaining interstitial edema and potential bilateral pleural effusions. Electronically Signed   Dg Chest Port 1 View  07/29/2015  CLINICAL DATA:  Cancer patient, metastatic urothelial carcinoma, shortness of breath, itching all over, with received 2 units of blood transfusion yesterday and Neulasta injection yesterday, history hypertension, former smoker  IMPRESSION: COPD changes with chronic accentuation of basilar markings. RIGHT apex scarring at question bullous disease versus less likely small loculated pneumothorax unchanged since 06/08/2015 CT. No acute abnormalities. Electronically Signed   By: Lavonia Dana M.D.   On: 07/29/2015 10:33   Echocardiogram - 08/03/2015 - Left ventricle: The cavity size was normal. There was mild  concentric hypertrophy with moderate focal basal hypertrophy.  Systolic function was severely reduced. The estimated ejection  fraction was in the range of 25% to 30%. There is akinesis of the  mid-apicalanteroseptal and inferoseptal myocardium. The study is  not technically sufficient to allow evaluation of LV diastolic  function. - Aortic valve: Trileaflet; mildly thickened, mildly calcified  leaflets. - Left atrium: The atrium was mildly dilated.  Echo - 06/24/2015 - Left ventricle: The cavity size was at the upper limits of  normal. Wall thickness was at the upper limits of normal.  Systolic function was mildly to moderately reduced. The  estimated  ejection fraction was in the range of 40% to 45%. There is severe  hypokinesis of the mid-apicalanteroseptal, anterior, inferior,  and apical myocardium. Features are consistent with a  pseudonormal left ventricular filling pattern, with concomitant  abnormal relaxation and increased filling pressure (grade 2  diastolic dysfunction). - Aortic valve: Mildly calcified annulus. Trileaflet; mildly  calcified leaflets. - Mitral valve: There was mild regurgitation. - Left atrium: The atrium was mildly dilated. - Right atrium: Central venous pressure (est): 3 mm Hg. - Tricuspid valve: There was trivial regurgitation. - Pulmonary arteries: PA peak pressure: 19 mm Hg (S). - Pericardium, extracardiac: There was no pericardial effusion.   ECG:  [place] nuclear stress test: 07/06/2015  There was no ST segment deviation noted during stress.  Findings consistent with prior myocardial infarction with peri-infarct ischemia in the apex and in the septal/inferoseptal/inferior wall territories. Both defects are primarily scar with fairly mild peri-infarct ischemia.  This is a high risk study. High risk based on low ejection fraction and multiple defects. There is a fairly minimal amount of myocardium currently at jeopardy.  The left ventricular ejection fraction is moderately decreased (30-44%).    ASSESSMENT AND PLAN  1. CAD - NSTEMI - heparin drip, severely decreased platelets, we will monitor closely, he is not a canddate for a cath given his low counts, metastatic cancer  2. Acute on chronic systolic CHF - start lasix 20 iv BID, carvedilol 3.125 mg PO BID, LVEF 25-30%, previosuly 40-45%, we wil restart carvedilol, hold lisinopril for now as Crea is elevated.  Chemo is being discontinued -Carboplatin/Gemcitabine , oncology is considering immunotherapy instead.  3. Paroxysmal a-fib - in SR now, no long term anticoagulation sec low Ptl count  4. Cardiac arrest - possibly sec  to allergic reaction   Signed, Dorothy Spark, MD, [facility] 08/04/2015, 11:39 AM

## 2015-08-05 ENCOUNTER — Other Ambulatory Visit (HOSPITAL_COMMUNITY): Payer: Self-pay | Admitting: Oncology

## 2015-08-05 ENCOUNTER — Ambulatory Visit (HOSPITAL_COMMUNITY): Payer: Medicare HMO

## 2015-08-05 DIAGNOSIS — D696 Thrombocytopenia, unspecified: Secondary | ICD-10-CM

## 2015-08-05 DIAGNOSIS — I480 Paroxysmal atrial fibrillation: Secondary | ICD-10-CM

## 2015-08-05 DIAGNOSIS — Z86718 Personal history of other venous thrombosis and embolism: Secondary | ICD-10-CM

## 2015-08-05 LAB — BASIC METABOLIC PANEL
ANION GAP: 11 (ref 5–15)
BUN: 23 mg/dL — AB (ref 6–20)
CALCIUM: 7.7 mg/dL — AB (ref 8.9–10.3)
CO2: 27 mmol/L (ref 22–32)
Chloride: 101 mmol/L (ref 101–111)
Creatinine, Ser: 1.42 mg/dL — ABNORMAL HIGH (ref 0.61–1.24)
GFR calc Af Amer: 55 mL/min — ABNORMAL LOW (ref >60)
GFR, EST NON AFRICAN AMERICAN: 48 mL/min — AB (ref >60)
Glucose, Bld: 92 mg/dL (ref 65–99)
POTASSIUM: 3.8 mmol/L (ref 3.5–5.1)
SODIUM: 139 mmol/L (ref 135–145)

## 2015-08-05 LAB — TROPONIN I: Troponin I: 0.5 ng/mL (ref <0.031)

## 2015-08-05 LAB — CBC
HCT: 31 % — ABNORMAL LOW (ref 39.0–52.0)
Hemoglobin: 9.6 g/dL — ABNORMAL LOW (ref 13.0–17.0)
MCH: 31.5 pg (ref 26.0–34.0)
MCHC: 31 g/dL (ref 30.0–36.0)
MCV: 101.6 fL — ABNORMAL HIGH (ref 78.0–100.0)
Platelets: 35 10*3/uL — ABNORMAL LOW (ref 150–400)
RBC: 3.05 MIL/uL — AB (ref 4.22–5.81)
RDW: 21.1 % — ABNORMAL HIGH (ref 11.5–15.5)
WBC: 11.9 10*3/uL — AB (ref 4.0–10.5)

## 2015-08-05 LAB — GLUCOSE, CAPILLARY: GLUCOSE-CAPILLARY: 97 mg/dL (ref 65–99)

## 2015-08-05 LAB — CULTURE, RESPIRATORY: SPECIAL REQUESTS: NORMAL

## 2015-08-05 LAB — CULTURE, RESPIRATORY W GRAM STAIN

## 2015-08-05 LAB — HEPARIN LEVEL (UNFRACTIONATED): HEPARIN UNFRACTIONATED: 0.4 [IU]/mL (ref 0.30–0.70)

## 2015-08-05 MED ORDER — WARFARIN - PHARMACIST DOSING INPATIENT
Freq: Every day | Status: DC
  Administered 2015-08-05 – 2015-08-06 (×2)

## 2015-08-05 MED ORDER — WARFARIN SODIUM 7.5 MG PO TABS
7.5000 mg | ORAL_TABLET | Freq: Once | ORAL | Status: AC
  Administered 2015-08-05: 7.5 mg via ORAL
  Filled 2015-08-05: qty 1

## 2015-08-05 NOTE — Progress Notes (Signed)
[facility] PROGRESS NOTE  Harry Franklin [id number] DOB: 02-Feb-1941 DOA: 08/02/2015 PCP: [name], MD  HPI/Brief narrative 75 yo male with respiratory leading to cardiac arrest. Found to have acute b/l DVT and presumed to have PE. He has hx of DVT and was not on anticoagulation due pancytopenia. 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 He also has hx of CAD, systolic CHF, A fib.  Assessment/Plan: 1. Bilateral DVT, likely PE 1. B LE DVT noted on 3/13 dopplers, with presumed PE 2. Currently on heparin gtt 3. Plts remain low, albeit stable 4. Discussed case with pt's Oncologist who strongly agrees with starting coumadin. Will order and monitor closely for bleed 2. Cardiac arrest 1. Pt s/p PEA arrest suspected from acute PE 2. Cardiology following 3. Start coumadin per Pulm note 3. CAD 1. Not good candidate for intervention  2. Cont medical management per Cardiology 4. Paroxysmal Afib 1. CHADS-VASc of 2 2. Per Cardiology, Afib alone not indication for anticoagulation, however given acute DVT and PE, would cautiously begin coumadin 5. Acute on chronic systolic CHF 1. Followed by Cardiology 2. Recs for lasix on discharge 6. Hypoxic respiratory failure 1. Resolved 2. Stable at present 7. Stage 4 urethral cancer 1. Followed by Dr. Whitney Muse 2. Next appt on 3/22 8. CKD2 1. Stable 9. Metabolic encephalopathy 1. Seems to be at baseline mental status  Code Status: Full Family Communication: Pt in room, family at bedside Disposition Plan: possible in 3-5 days when INR therapeutic   Consultants:  Pulmonary  Cardiology  Oncology over phone  Procedures:    Antibiotics: Anti-infectives    Start     Dose/Rate Route Frequency Ordered Stop   08/03/15 0000  linezolid (ZYVOX) IVPB 600 mg  Status:  Discontinued     600 mg 300 mL/hr over 60 Minutes Intravenous Every 12 hours 08/02/15 1549 08/03/15 0829   08/03/15 0000  piperacillin-tazobactam (ZOSYN) IVPB 3.375 g  Status:  Discontinued     3.375 g 12.5 mL/hr over 240 Minutes Intravenous Every 8 hours 08/02/15 1554 08/03/15 0829   08/02/15 1200  linezolid (ZYVOX) IVPB 600 mg     600 mg 300 mL/hr over 60 Minutes Intravenous  Once 08/02/15 1152 08/02/15 1330   08/02/15 1145  ceFEPIme (MAXIPIME) 1 g in dextrose 5 % 50 mL IVPB     1 g 100 mL/hr over 30 Minutes Intravenous  Once 08/02/15 1130 08/02/15 1248      HPI/Subjective: Eager to go home  Objective: Filed Vitals:   08/04/15 1734 08/04/15 2024 08/05/15 0410 08/05/15 1338  BP: 113/72 125/71 117/55 111/66  Pulse: 83 83 84 85  Temp: 98.3 F (36.8 C) 97.8 F (36.6 C) 98.4 F (36.9 C) 98.7 F (37.1 C)  TempSrc: Oral Oral Oral Oral  Resp: 18 18 18 18   Height:      Weight:   122.426 kg (269 lb 14.4 oz)   SpO2: 100% 99% 96% 97%    Intake/Output Summary (Last 24 hours) at 08/05/15 1517 Last data filed at 08/05/15 1311  Gross per 24 hour  Intake    877 ml  Output   3245 ml  Net  -2368 ml   Filed Weights   08/03/15 0319 08/04/15 0300 08/05/15 0410  Weight: 128.8 kg (283 lb 15.2 oz) 124.6 kg (274 lb 11.1 oz) 122.426 kg (269 lb 14.4 oz)    Exam:   General:  Awake, in nad  Cardiovascular: regular, s1, s2  Respiratory: normal resp effort, no wheezing  Abdomen: soft, nondistended  Musculoskeletal: perfused, no clubbing   Data Reviewed: Basic Metabolic Panel:  Recent Labs Lab 08/02/15 0912 08/02/15 1039 08/02/15 1040 08/02/15 2211 08/03/15 0318 08/04/15 0417 08/05/15 0410  NA 139 137 140  --  139 140 139  K 4.9 4.2 4.1  --  3.8 3.7 3.8  CL 106 105 103  --  104 101 101  CO2 27 22  --   --  27 28 27   GLUCOSE 161* 262* 257*  --  83 85 92  BUN 31* 30* 31*  --  27* 26* 23*  CREATININE 1.34* 1.54* 1.30*  --  1.34* 1.62* 1.42*  CALCIUM 8.8* 8.6*  --   --  8.2* 7.9* 7.7*  MG  --   --   --  1.2* 1.2*  --   --   PHOS  --   --   --  4.0 4.0  --   --    Liver  Function Tests:  Recent Labs Lab 08/02/15 0912 08/02/15 1039  AST 43* 48*  ALT 44 44  ALKPHOS 149* 172*  BILITOT 1.1 1.2  PROT 6.4* 6.4*  ALBUMIN 3.4* 3.3*   No results for input(s): LIPASE, AMYLASE in the last 168 hours. No results for input(s): AMMONIA in the last 168 hours. CBC:  Recent Labs Lab 08/02/15 0912 08/02/15 1039 08/02/15 1040 08/03/15 0319 08/04/15 0417 08/05/15 0410  WBC 17.5* 33.2*  --  11.0* 11.2* 11.9*  NEUTROABS 13.8* 22.9*  --   --   --   --   HGB 10.7* 11.6* 13.6 9.6* 9.8* 9.6*  HCT 34.1* 37.6* 40.0 30.9* 31.5* 31.0*  MCV 103.3* 105.0*  --  102.3* 101.3* 101.6*  PLT 32* 37*  --  25* 32* 35*   Cardiac Enzymes:  Recent Labs Lab 08/02/15 2211 08/03/15 0318  TROPONINI 0.69* 1.29*   BNP (last 3 results)  Recent Labs  06/08/15 0830 08/02/15 1039  BNP 657.0* 721.0*    ProBNP (last 3 results) No results for input(s): PROBNP in the last 8760 hours.  CBG:  Recent Labs Lab 08/03/15 0823 08/03/15 0926 08/03/15 1149 08/03/15 1730 08/05/15 1116  GLUCAP 65 81 68 94 97    Recent Results (from the past 240 hour(s))  Blood culture (routine x 2)     Status: None (Preliminary result)   Collection Time: 08/02/15 12:00 PM  Result Value Ref Range Status   Specimen Description BLOOD PORTA CATH DRAWN BY RN  Final   Special Requests BOTTLES DRAWN AEROBIC AND ANAEROBIC 10 CC EACH  Final   Culture NO GROWTH 3 DAYS  Final   Report Status PENDING  Incomplete  Blood culture (routine x 2)     Status: None (Preliminary result)   Collection Time: 08/02/15 12:15 PM  Result Value Ref Range Status   Specimen Description BLOOD LEFT HAND  Final   Special Requests BOTTLES DRAWN AEROBIC ONLY 8 CC  Final   Culture NO GROWTH 3 DAYS  Final   Report Status PENDING  Incomplete  MRSA PCR Screening     Status: None   Collection Time: 08/02/15  3:37 PM  Result Value Ref Range Status   MRSA by PCR NEGATIVE NEGATIVE Final    Comment:        The GeneXpert MRSA  Assay (FDA approved for NASAL specimens only), is one component of a comprehensive MRSA colonization surveillance program. It is not intended to diagnose MRSA infection nor  to guide or monitor treatment for MRSA infections.   Culture, respiratory (tracheal aspirate)     Status: None   Collection Time: 08/02/15  4:00 PM  Result Value Ref Range Status   Specimen Description TRACHEAL ASPIRATE  Final   Special Requests Normal  Final   Gram Stain   Final    ABUNDANT WBC PRESENT,BOTH PMN AND MONONUCLEAR NO SQUAMOUS EPITHELIAL CELLS SEEN NO ORGANISMS SEEN Performed at [facility]    Culture   Final    RARE STREPTOCOCCUS GROUP G Note: Beta hemolytic streptococci are predictably susceptible to penicillin and other beta lactams. Susceptibility testing not routinely performed. Performed at [facility]    Report Status 08/05/2015 FINAL  Final  Urine culture     Status: None   Collection Time: 08/02/15  4:27 PM  Result Value Ref Range Status   Specimen Description URINE, CATHETERIZED  Final   Special Requests Normal  Final   Culture NO GROWTH 1 DAY  Final   Report Status 08/03/2015 FINAL  Final     Studies: Dg Chest Port 1 View  08/04/2015  CLINICAL DATA:  Respiratory failure/ shortness of breath EXAM: PORTABLE CHEST 1 VIEW COMPARISON:  August 03, 2015 FINDINGS: Endotracheal tube and nasogastric tube have been removed. Port-A-Cath tip is in the superior vena cava. No pneumothorax. There is atelectatic change in the medial left base. The lungs elsewhere clear. Heart remains mildly enlarged with pulmonary vascularity within normal limits. No adenopathy. There is degenerative change in each shoulder. IMPRESSION: No pneumothorax. No frank edema or consolidation. Stable cardiac prominence. Electronically Signed   By: Lowella Grip III M.D.   On: 08/04/2015 07:24    Scheduled Meds: . antiseptic oral rinse  7 mL Mouth Rinse BID  . aspirin  81 mg Oral Daily  . atorvastatin   80 mg Oral q1800  . carvedilol  3.125 mg Oral BID WC  . furosemide  20 mg Intravenous BID  . pantoprazole  40 mg Oral Q1200  . sodium chloride flush  10-40 mL Intracatheter Q12H  . warfarin  7.5 mg Oral ONCE-1800  . Warfarin - Pharmacist Dosing Inpatient   Does not apply q1800   Continuous Infusions: . sodium chloride 10 mL/hr at 08/04/15 1100  . heparin 1,700 Units/hr (08/05/15 [id number])    Active Problems:   Atrial fibrillation with rapid ventricular response (HCC)   Acute pulmonary edema (HCC)   Cardiac arrest ([place])   Respiratory arrest (HCC)   AKI (acute kidney injury) ([place])   Thrombocytopenia (HCC)   Absolute anemia   Acute hypoxemic respiratory failure ([place])   Malignant neoplasm of urinary bladder ([place])   [facility], [facility] Pager [phone]. If 7PM-7AM, please contact night-coverage at [email or web address], password [facility] 08/05/2015, 3:17 PM  LOS: 3 days

## 2015-08-05 NOTE — Progress Notes (Signed)
ANTICOAGULATION CONSULT NOTE  Pharmacy Consult:  Heparin Indication: DVT  Allergies  Allergen Reactions  . Neulasta [Pegfilgrastim] Hives    Pt reports breaking out in hives less than 24 hours after taking neulasta. Hives appear on arms and legs.  . Rabeprazole     Other reaction(s): DIARRHEA  . Vancomycin Hives and Rash    Patient Measurements: Height: 6\' 4"  ([id number] cm) Weight: 269 lb 14.4 oz (122.426 kg) IBW/kg (Calculated) : 86.8 Heparin Dosing Weight: 114 kg  Vital Signs: Temp: 98.7 F (37.1 C) (03/16 1338) Temp Source: Oral (03/16 1338) BP: 111/66 mmHg (03/16 1338) Pulse Rate: 85 (03/16 1338)  Labs:  Recent Labs  08/02/15 2211 08/03/15 0318  08/03/15 0319 08/03/15 0943 08/04/15 0416 08/04/15 0417 08/05/15 0410  HGB  --   --   < > 9.6*  --   --  9.8* 9.6*  HCT  --   --   --  30.9*  --   --  31.5* 31.0*  PLT  --   --   --  25*  --   --  32* 35*  APTT 106*  --   --   --   --   --   --   --   LABPROT 18.3*  --   --   --   --   --   --   --   INR 1.51*  --   --   --   --   --   --   --   HEPARINUNFRC  --   --   < > 0.27* 0.38 0.30  --  0.40  CREATININE  --  1.34*  --   --   --   --  1.62* 1.42*  TROPONINI 0.69* 1.29*  --   --   --   --   --   --   < > = values in this interval not displayed.  Estimated Creatinine Clearance: 67.2 mL/min (by C-G formula based on Cr of 1.42).    Assessment: 18 YOM with history of bladder cancer with metastasis and pancytopenia due to chemotherapy.  He has new bilateral DVTs and presumed PE to continue on IV heparin.  Heparin level is therapeutic and stable.  Now starting Coumadin. Will provide Coumadin + Heparin bridge for at least 5 days AND 24 hours of INR >2. Today is D#1/5 of overlap.    Goal of Therapy:  Heparin level 0.3-0.7 units/ml Monitor platelets by anticoagulation protocol: Yes    Plan:  - Coumadin 7.5 mg once  - Continue heparin gtt at 1700 units/hr - Daily HL / CBC, and PT/INR  - F/U plans for IVC filters vs  stopping AC   Albertina Parr, PharmD., BCPS Clinical Pharmacist Pager [phone]

## 2015-08-05 NOTE — Progress Notes (Signed)
ANTICOAGULATION CONSULT NOTE  Pharmacy Consult:  Heparin Indication: DVT  Allergies  Allergen Reactions  . Neulasta [Pegfilgrastim] Hives    Pt reports breaking out in hives less than 24 hours after taking neulasta. Hives appear on arms and legs.  . Rabeprazole     Other reaction(s): DIARRHEA  . Vancomycin Hives and Rash    Patient Measurements: Height: 6\' 4"  (193 cm) Weight: 269 lb 14.4 oz (122.426 kg) IBW/kg (Calculated) : 86.8 Heparin Dosing Weight: 114 kg  Vital Signs: Temp: 98.4 F (36.9 C) (03/16 0410) Temp Source: Oral (03/16 0410) BP: 117/55 mmHg (03/16 0410) Pulse Rate: 84 (03/16 0410)  Labs:  Recent Labs  08/02/15 2211 08/03/15 0318  08/03/15 0319 08/03/15 0943 08/04/15 0416 08/04/15 0417 08/05/15 0410  HGB  --   --   --  9.6*  --   --  9.8* 9.6*  HCT  --   --   --  30.9*  --   --  31.5* 31.0*  PLT  --   --   --  25*  --   --  32* 35*  APTT 106*  --   --   --   --   --   --   --   LABPROT 18.3*  --   --   --   --   --   --   --   INR 1.51*  --   --   --   --   --   --   --   HEPARINUNFRC  --   --   < > 0.27* 0.38 0.30  --  0.40  CREATININE  --  1.34*  --   --   --   --  1.62* 1.42*  TROPONINI 0.69* 1.29*  --   --   --   --   --   --   < > = values in this interval not displayed.  Estimated Creatinine Clearance: 67.2 mL/min (by C-G formula based on Cr of 1.42).    Assessment: 67 YOM with history of bladder cancer with metastasis and pancytopenia due to chemotherapy.  He has new bilateral DVTs and presumed PE to continue on IV heparin.  Heparin level is therapeutic and stable.  Per Cards, no longer-term AC due to thrombocytopenia.  No overt bleeding reported.   Goal of Therapy:  Heparin level 0.3-0.7 units/ml Monitor platelets by anticoagulation protocol: Yes    Plan:  - Continue heparin gtt at 1700 units/hr - Daily HL / CBC - F/U plans for IVC filters vs stopping AC   Mylie Mccurley D. Mina Marble, PharmD, BCPS Pager:  [phone] 08/05/2015, 8:02  AM

## 2015-08-05 NOTE — Evaluation (Addendum)
Physical Therapy Evaluation Patient Details Name: Harry Franklin MRN: [id number] DOB: [birth year] Today's Date: 08/05/2015   History of Present Illness  Patient is a 75 y/o male with hx of metastatic urinary bladder malignancy, on chemotherapy, CAD, CHF, CKD, admitted for acute dyspnea. s/p cardiac arrest with CPR for 3 mins requiring intubation. Found to have Acute hypoxemic respiratory failure most likely secondary to acute on chronic pulmonary edema. Pt with acute DVTs BLEs.  Clinical Impression  Patient presents with decreased strength, balance, endurance and respiratory status impacting mobility. Tolerated gait training with Min guard assist for safety. Pt with 2/4 DOE. Not able to tell his limits and is impulsive at times. Encouraged ambulation 3 times/day with nursing. Recommend use of RW at home for safety. Anticipate with increased mobility and pt's motivation, pt will be able to return home with support from family. Will follow acutely to maximize independence and mobility prior to return home.    Follow Up Recommendations Supervision - Intermittent;No PT follow up    Equipment Recommendations  None recommended by PT    Recommendations for Other Services OT consult     Precautions / Restrictions Precautions Precautions: Fall Restrictions Weight Bearing Restrictions: No      Mobility  Bed Mobility Overal bed mobility: Needs Assistance Bed Mobility: Supine to Sit     Supine to sit: Modified independent (Device/Increase time);HOB elevated     General bed mobility comments: No assist needed. Impulsively got to EOB.  Transfers Overall transfer level: Needs assistance Equipment used: Rolling walker (2 wheeled) Transfers: Sit to/from Stand Sit to Stand: Min guard         General transfer comment: Slow to rise but no assist needed. Transferred to chair post ambulation bout.  Ambulation/Gait Ambulation/Gait assistance: Min guard Ambulation Distance (Feet): 200  Feet Assistive device: Rolling walker (2 wheeled) Gait Pattern/deviations: Step-through pattern;Decreased stride length;Trunk flexed Gait velocity: decreased   General Gait Details: Slow, mostly steady gait with 2/4 DOE. Fatigues but does not like to admit it.   Stairs            Wheelchair Mobility    Modified Rankin (Stroke Patients Only)       Balance Overall balance assessment: Needs assistance Sitting-balance support: Feet supported;No upper extremity supported Sitting balance-Leahy Scale: Normal     Standing balance support: During functional activity Standing balance-Leahy Scale: Fair Standing balance comment: Able to perform within room ambulation furniture walking. Requires UE support for ambulation.                             Pertinent Vitals/Pain Pain Assessment: No/denies pain    Home Living Family/patient expects to be discharged to:: Private residence Living Arrangements: Spouse/significant other Available Help at Discharge: Family;Available 24 hours/day Type of Home: House Home Access: Stairs to enter Entrance Stairs-Rails: None Entrance Stairs-Number of Steps: 1 + 1 wide steps (can fit a walker) Home Layout: One level Home Equipment: Walker - 2 wheels;Shower seat;Walker - standard      Prior Function Level of Independence: Independent         Comments: Using RW as needed right before admission to hospital due to fall and hurting right hand.     Hand Dominance        Extremity/Trunk Assessment   Upper Extremity Assessment: Defer to OT evaluation           Lower Extremity Assessment:  (left foot drop- chronic, getting an AFO  already)         Communication   Communication: HOH (wears hearing aids)  Cognition Arousal/Alertness: Awake/alert Behavior During Therapy: Impulsive Overall Cognitive Status: Within Functional Limits for tasks assessed                      General Comments General comments (skin  integrity, edema, etc.): Daughter present in room during session.    Exercises        Assessment/Plan    PT Assessment Patient needs continued PT services  PT Diagnosis Difficulty walking;Generalized weakness   PT Problem List Decreased strength;Cardiopulmonary status limiting activity;Decreased activity tolerance;Decreased balance;Decreased mobility;Decreased safety awareness  PT Treatment Interventions Balance training;Gait training;Stair training;Functional mobility training;Therapeutic activities;Therapeutic exercise;Patient/family education;DME instruction   PT Goals (Current goals can be found in the Care Plan section) Acute Rehab PT Goals Patient Stated Goal: to return to PLOF PT Goal Formulation: With patient Time For Goal Achievement: 08/19/15 Potential to Achieve Goals: Fair    Frequency Min 3X/week   Barriers to discharge        Co-evaluation               End of Session Equipment Utilized During Treatment: Gait belt Activity Tolerance: Patient tolerated treatment well Patient left: in chair;with call bell/phone within reach;with family/visitor present Nurse Communication: Mobility status         Time: [id number] PT Time Calculation (min) (ACUTE ONLY): 29 min   Charges:   PT Evaluation $PT Eval Moderate Complexity: 1 Procedure PT Treatments $Gait Training: 8-22 mins   PT G Codes:        Steward Sames A Loralye Loberg 08/05/2015, 11:05 AM Wray Kearns, [place], DPT [phone]

## 2015-08-05 NOTE — Progress Notes (Signed)
Patient Name: Harry Franklin Date of Encounter: 08/05/2015   SUBJECTIVE  Feeling well. No chest pain or palpitations. SOB improved. Ambulated well.   CURRENT MEDS . antiseptic oral rinse  7 mL Mouth Rinse BID  . aspirin  81 mg Oral Daily  . atorvastatin  80 mg Oral q1800  . carvedilol  3.125 mg Oral BID WC  . furosemide  20 mg Intravenous BID  . pantoprazole  40 mg Oral Q1200  . sodium chloride flush  10-40 mL Intracatheter Q12H    OBJECTIVE  Filed Vitals:   08/04/15 1600 08/04/15 1734 08/04/15 2024 08/05/15 0410  BP: 129/112 113/72 125/71 117/55  Pulse: 88 83 83 84  Temp: 98.5 F (36.9 C) 98.3 F (36.8 C) 97.8 F (36.6 C) 98.4 F (36.9 C)  TempSrc: Oral Oral Oral Oral  Resp: 18 18 18 18   Height:      Weight:    269 lb 14.4 oz (122.426 kg)  SpO2: 96% 100% 99% 96%    Intake/Output Summary (Last 24 hours) at 08/05/15 1326 Last data filed at 08/05/15 0557  Gross per 24 hour  Intake    424 ml  Output   2520 ml  Net  -2096 ml   Filed Weights   08/03/15 0319 08/04/15 0300 08/05/15 0410  Weight: 283 lb 15.2 oz (128.8 kg) 274 lb 11.1 oz (124.6 kg) 269 lb 14.4 oz (122.426 kg)    PHYSICAL EXAM  General: Pleasant, NAD. Neuro: Alert and oriented X 3. Moves all extremities spontaneously. Psych: Normal affect. HEENT:  Normal  Neck: Supple without bruits or JVD. Lungs:  Resp regular and unlabored, CTA. Heart: RRR no s3, s4, systolic murmurs. Abdomen: Soft, non-tender, non-distended, BS + x 4.  Extremities: No clubbing, cyanosis. Trace BL LE edema. DP/PT/Radials 2+ and equal bilaterally.  Accessory Clinical Findings  CBC  Recent Labs  08/04/15 0417 08/05/15 0410  WBC 11.2* 11.9*  HGB 9.8* 9.6*  HCT 31.5* 31.0*  MCV 101.3* 101.6*  PLT 32* 35*   Basic Metabolic Panel  Recent Labs  08/02/15 2211  08/03/15 0318 08/04/15 0417 08/05/15 0410  NA  --   < > 139 140 139  K  --   < > 3.8 3.7 3.8  CL  --   < > 104 101 101  CO2  --   < > 27 28 27   GLUCOSE   --   < > 83 85 92  BUN  --   < > 27* 26* 23*  CREATININE  --   < > 1.34* 1.62* 1.42*  CALCIUM  --   < > 8.2* 7.9* 7.7*  MG 1.2*  --  1.2*  --   --   PHOS 4.0  --  4.0  --   --   < > = values in this interval not displayed. Liver Function Tests No results for input(s): AST, ALT, ALKPHOS, BILITOT, PROT, ALBUMIN in the last 72 hours. No results for input(s): LIPASE, AMYLASE in the last 72 hours. Cardiac Enzymes  Recent Labs  08/02/15 2211 08/03/15 0318  TROPONINI 0.69* 1.29*   BNP Invalid input(s): POCBNP D-Dimer No results for input(s): DDIMER in the last 72 hours. Hemoglobin A1C  Recent Labs  08/02/15 2211  HGBA1C 5.6   Fasting Lipid Panel  Recent Labs  08/02/15 2212  TRIG 49    TELE  sinusrhythm  Radiology/Studies  Ct Head Wo Contrast  08/02/2015  CLINICAL DATA:  Altered mental status/ cardiac arrest EXAM: CT  HEAD WITHOUT CONTRAST TECHNIQUE: Contiguous axial images were obtained from the base of the skull through the vertex without intravenous contrast. COMPARISON:  None. FINDINGS: There is age related volume loss. There is no intracranial mass, hemorrhage, extra-axial fluid collection, or midline shift. There is slight small vessel disease in the centra semiovale bilaterally. There is preservation of the gray -white differentiation. There is subtle decreased attenuation in the mid to lower pons on the left. No other evidence of potential acute infarct. Bony calvarium appears intact. Mastoid air cells are clear. There is mucosal thickening in several ethmoid air cells bilaterally. No intraorbital lesions are identified. IMPRESSION: No brain edema is evident. No hemorrhage. There is subtle decreased attenuation in the medial left mid to lower pons. This finding may be artifactual. However, a subtle early infarct in the left pons must be of concern. No other findings suggesting potential acute infarct evident on this study. There is slight small vessel disease in the centra  semiovale bilaterally. There are areas of ethmoid sinus disease bilaterally. Electronically Signed   By: Lowella Grip III M.D.   On: 08/02/2015 17:26   Ct Chest Wo Contrast  08/02/2015  CLINICAL DATA:  Follow-up after cardiac arrest. History of hypertension. EXAM: CT CHEST WITHOUT CONTRAST TECHNIQUE: Multidetector CT imaging of the chest was performed following the standard protocol without IV contrast. COMPARISON:  Chest CT dated 06/08/2015. FINDINGS: Mediastinum/Lymph Nodes: Endotracheal tube well positioned with tip above the level of the carina. Enteric tube passes into the stomach. Right subclavian central line appears well positioned. Mild cardiomegaly is unchanged. Trace pericardial effusion again noted at the heart base. Diffuse coronary artery calcifications again noted. Scattered atherosclerotic changes are seen along the walls of the normal-caliber thoracic aorta. No mass or enlarged lymph nodes seen within the mediastinum. Lungs/Pleura: Bilateral pleural effusions, moderate to large in size, increased compared to the previous study. Additional patchy consolidations throughout both lungs, most likely atelectasis. Mild emphysematous change noted at each lung apex. Upper abdomen: Enteric tube in the stomach. Limited images of the upper abdomen are otherwise unremarkable. Musculoskeletal: No chest wall mass or suspicious bone lesions identified. Degenerative changes throughout the thoracic spine, moderate in degree. IMPRESSION: 1. Bilateral pleural effusions, moderate to large in size, increased compared to the previous study. Additional patchy consolidations scattered within both lungs, most likely atelectasis, less likely pneumonia or aspiration. 2. Cardiomegaly, stable. Diffuse coronary artery calcifications again noted. Trace pericardial effusion at the heart base appears stable. 3. Tubes and lines appear adequately positioned, as detailed above. Electronically Signed   By: Franki Cabot M.D.    On: 08/02/2015 17:35   Dg Chest Port 1 View  08/04/2015  CLINICAL DATA:  Respiratory failure/ shortness of breath EXAM: PORTABLE CHEST 1 VIEW COMPARISON:  August 03, 2015 FINDINGS: Endotracheal tube and nasogastric tube have been removed. Port-A-Cath tip is in the superior vena cava. No pneumothorax. There is atelectatic change in the medial left base. The lungs elsewhere clear. Heart remains mildly enlarged with pulmonary vascularity within normal limits. No adenopathy. There is degenerative change in each shoulder. IMPRESSION: No pneumothorax. No frank edema or consolidation. Stable cardiac prominence. Electronically Signed   By: Lowella Grip III M.D.   On: 08/04/2015 07:24   Dg Chest Port 1 View  08/03/2015  CLINICAL DATA:  Acute respiratory failure. EXAM: PORTABLE CHEST 1 VIEW COMPARISON:  08/02/2015 FINDINGS: Endotracheal tube remains with the tip approximately 2 cm above the carina. Gastric decompression tube extends below the diaphragm. Port-A-Cath shows  stable positioning. Temporary pacing pads present. Lungs show improved aeration bilaterally and decrease in pulmonary edema with suggestion of some residual mild interstitial edema/ CHF and potential bilateral pleural effusions. Atelectasis versus consolidation noted in both lower lobes. IMPRESSION: Decreasing pulmonary edema with improved bilateral aeration. Suggestion of mild remaining interstitial edema and potential bilateral pleural effusions. Electronically Signed   By: Aletta Edouard M.D.   On: 08/03/2015 08:24   Dg Chest Portable 1 View  08/02/2015  CLINICAL DATA:  75 year old male with shortness breath and endotracheal tube placement. Subsequent encounter. EXAM: PORTABLE CHEST 1 VIEW COMPARISON:  07/29/2015 chest x-ray and 06/08/2015 chest CT. FINDINGS: Endotracheal tube tip 3.2 cm above the carina. Right central line tip seen to level of the distal superior vena cava. Nasogastric tube courses below the diaphragm. Tip is not included on  the present exam. Diffuse asymmetric airspace disease greater on right may represent pulmonary edema with pleural effusions limiting evaluation for lower lobe infiltrate or mass. No gross pneumothorax. Cardiomegaly. IMPRESSION: Endotracheal tube tip 3.2 cm above the carina. Right central line tip seen to level of the distal superior vena cava. Nasogastric tube courses below the diaphragm. Tip is not included on the present exam. Diffuse asymmetric airspace disease greater on right may represent pulmonary edema with pleural effusions limiting evaluation for lower lobe infiltrate or mass. Cardiomegaly. Electronically Signed   By: Genia Del M.D.   On: 08/02/2015 11:05   Dg Chest Port 1 View  07/29/2015  CLINICAL DATA:  Cancer patient, metastatic urothelial carcinoma, shortness of breath, itching all over, with received 2 units of blood transfusion yesterday and Neulasta injection yesterday, history hypertension, former smoker EXAM: PORTABLE CHEST 1 VIEW COMPARISON:  CT chest 06/08/2015 ; chest radiograph 06/30/2013 FINDINGS: RIGHT jugular Port-A-Cath stable tip projecting over SVC. Mild enlargement of cardiac silhouette. Mediastinal contours and pulmonary vascularity normal. COPD changes with minimal bibasilar opacities unchanged since 2015. No definite acute infiltrate or pleural effusion. Bullous change versus less likely small loculated pneumothorax RIGHT apex unchanged. No LEFT pneumothorax identified. Osseous structures unremarkable. 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 RIGHT apex scarring at question bullous disease versus less likely small loculated pneumothorax unchanged since 06/08/2015 CT. No acute abnormalities. Electronically Signed   By: Lavonia Dana M.D.   On: 07/29/2015 10:33    ASSESSMENT AND PLAN  1. CAD - NSTEMI  - severely decreased platelets,  he is not a canddate for a cath given his low counts, metastatic cancer.   2. Acute on chronic systolic CHF -  diuresed neg 6.9L with 13lb weight loss (282-->269lb). Appears Close euvolemic. Scr improved with Diuresis. Consider switching lasix to PO.  - Continue carvedilol 3.125 mg PO BID. Held lisinopril for now as Crea is elevated. Resume once stable.  - LVEF 25-30%, previosuly 40-45%,   3. Paroxysmal a-fib - in SR now, Rate is stable. low Ptl count.   4. Cardiac arrest - possibly sec to allergic reaction and acute PE - Currently on heparin --> PCCM has recommenced coumadin. Will defer to MD and primary.   5. Stage 4 urothelial cancer  - Chemo is being discontinued -Carboplatin/Gemcitabine , oncology is considering immunotherapy instead.  Signed, Leanor Kail PA-C Pager [phone]   The patient was seen, examined and discussed with Bhagat,Bhavinkumar PA-C and I agree with the above.   ASSESSMENT AND PLAN  1. CAD - NSTEMI - heparin drip, severely decreased platelets, we will monitor closely, he is not a candidate for a cath given his  low counts, metastatic cancer, repeat troponin, if downtrending d/c heparin.   2. Acute on chronic systolic CHF - started lasix 20 iv BID with -2L, carvedilol 3.125 mg PO BID, LVEF 25-30%, previosuly 40-45%, hold lisinopril for now as Crea is elevated.  Chemo is being discontinued -Carboplatin/Gemcitabine , oncology is considering immunotherapy instead.  3. Paroxysmal a-fib - in SR now, no long term anticoagulation sec low Ptl count  4. Cardiac arrest - possibly sec to allergic reaction  Anticipated discharge tomorrow on Lasix 40 mg po daily and KCl 10 mEq daily.  Signed, Dorothy Spark MD, [facility] 08/05/2015

## 2015-08-05 NOTE — Progress Notes (Signed)
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 Improved SOB.   CURRENT MEDS  . antiseptic oral rinse  7 mL Mouth Rinse BID  . aspirin  81 mg Oral Daily  . atorvastatin  80 mg Oral q1800  . carvedilol  3.125 mg Oral BID WC  . furosemide  20 mg Intravenous BID  . pantoprazole  40 mg Oral Q1200  . sodium chloride flush  10-40 mL Intracatheter Q12H   OBJECTIVE  Filed Vitals:   08/04/15 1600 08/04/15 1734 08/04/15 2024 08/05/15 0410  BP: 129/112 113/72 125/71 117/55  Pulse: 88 83 83 84  Temp: 98.5 F (36.9 C) 98.3 F (36.8 C) 97.8 F (36.6 C) 98.4 F (36.9 C)  TempSrc: Oral Oral Oral Oral  Resp: 18 18 18 18   Height:      Weight:    269 lb 14.4 oz (122.426 kg)  SpO2: 96% 100% 99% 96%    Intake/Output Summary (Last 24 hours) at 08/05/15 1256 Last data filed at 08/05/15 0557  Gross per 24 hour  Intake    451 ml  Output   2520 ml  Net  -2069 ml   Filed Weights   08/03/15 0319 08/04/15 0300 08/05/15 0410  Weight: 283 lb 15.2 oz (128.8 kg) 274 lb 11.1 oz (124.6 kg) 269 lb 14.4 oz (122.426 kg)   PHYSICAL EXAM  General: Pleasant, NAD. Neuro: Alert and oriented X 3. Moves all extremities spontaneously. Psych: Normal affect. HEENT:  Normal  Neck: Supple without bruits or JVD. Lungs:  Resp regular and unlabored, CTA. Heart: RRR no s3, s4, or murmurs. Abdomen: Soft, non-tender, non-distended, BS + x 4.  Extremities: No clubbing, cyanosis, 2+ edema B/L. DP/PT/Radials 2+ and equal bilaterally.  Accessory Clinical Findings  CBC  Recent Labs  08/04/15 0417 08/05/15 0410  WBC 11.2* 11.9*  HGB 9.8* 9.6*    HCT 31.5* 31.0*  MCV 101.3* 101.6*  PLT 32* 35*   Basic Metabolic Panel  Recent Labs  08/02/15 2211  08/03/15 0318 08/04/15 0417 08/05/15 0410  NA  --   < > 139 140 139  K  --   < > 3.8 3.7 3.8  CL  --   < > 104 101 101  CO2  --   < > 27 28 27   GLUCOSE  --   < > 83 85 92  BUN  --   < > 27* 26* 23*  CREATININE  --   < > 1.34* 1.62* 1.42*  CALCIUM  --   < > 8.2* 7.9* 7.7*  MG 1.2*  --  1.2*  --   --   PHOS 4.0  --  4.0  --   --   < > = values in this interval not displayed.  Recent Labs  08/02/15 2211 08/03/15 0318  TROPONINI 0.69* 1.29*    Recent Labs  08/02/15 2211  HGBA1C 5.6   Fasting Lipid Panel  Recent Labs  08/02/15 2212  TRIG 49   Dg Chest Port 1 View  08/04/2015  CLINICAL DATA:  Respiratory failure/ shortness of breath EXAM: PORTABLE CHEST 1 VIEW COMPARISON:  August 03, 2015 FINDINGS: Endotracheal tube and nasogastric tube have been removed. Port-A-Cath tip is in the superior vena cava. No pneumothorax. There is atelectatic change in the medial left base. The lungs elsewhere clear. Heart remains mildly enlarged with pulmonary vascularity within normal limits. No adenopathy. There is degenerative change in each shoulder. IMPRESSION: No pneumothorax. No frank edema or consolidation. Stable cardiac prominence.   TELE: SR    ASSESSMENT AND PLAN  1. CAD - NSTEMI - heparin drip, severely decreased platelets, we will monitor closely, he is not a candidate for a cath given his low counts, metastatic cancer, repeat troponin, if downtrending d/c heparin.   2. Acute on chronic systolic CHF - started lasix 20 iv BID with -2L, carvedilol 3.125 mg PO BID, LVEF 25-30%, previosuly 40-45%,  hold lisinopril for now as Crea is elevated.  Chemo is being discontinued -Carboplatin/Gemcitabine , oncology is considering immunotherapy instead.  3. Paroxysmal a-fib - in SR now, no long term anticoagulation sec low Ptl count  4. Cardiac arrest - possibly sec to allergic  reaction  Anticipated discharge tomorrow on Lasix 40 mg po daily and KCl 10 mEq daily.  Signed, Dorothy Spark MD, [facility] 08/05/2015

## 2015-08-05 NOTE — Care Management Important Message (Signed)
Important Message  Patient Details  Name: Harry Franklin MRN: [id number] Date of Birth: 03-16-1941   Medicare Important Message Given:  Yes    [name] Harry Franklin 08/05/2015, 3:14 PM

## 2015-08-06 ENCOUNTER — Ambulatory Visit (HOSPITAL_COMMUNITY): Payer: Medicare HMO | Admitting: Hematology & Oncology

## 2015-08-06 DIAGNOSIS — I4891 Unspecified atrial fibrillation: Secondary | ICD-10-CM

## 2015-08-06 LAB — BASIC METABOLIC PANEL
ANION GAP: 9 (ref 5–15)
BUN: 22 mg/dL — ABNORMAL HIGH (ref 6–20)
CALCIUM: 7.7 mg/dL — AB (ref 8.9–10.3)
CO2: 28 mmol/L (ref 22–32)
Chloride: 102 mmol/L (ref 101–111)
Creatinine, Ser: 1.29 mg/dL — ABNORMAL HIGH (ref 0.61–1.24)
GFR, EST NON AFRICAN AMERICAN: 54 mL/min — AB (ref >60)
GLUCOSE: 89 mg/dL (ref 65–99)
POTASSIUM: 3.6 mmol/L (ref 3.5–5.1)
Sodium: 139 mmol/L (ref 135–145)

## 2015-08-06 LAB — HEPARIN LEVEL (UNFRACTIONATED): Heparin Unfractionated: 0.35 IU/mL (ref 0.30–0.70)

## 2015-08-06 LAB — CBC
HEMATOCRIT: 25.3 % — AB (ref 39.0–52.0)
HEMOGLOBIN: 8 g/dL — AB (ref 13.0–17.0)
MCH: 31.9 pg (ref 26.0–34.0)
MCHC: 31.6 g/dL (ref 30.0–36.0)
MCV: 100.8 fL — ABNORMAL HIGH (ref 78.0–100.0)
Platelets: 38 10*3/uL — ABNORMAL LOW (ref 150–400)
RBC: 2.51 MIL/uL — AB (ref 4.22–5.81)
RDW: 20.8 % — ABNORMAL HIGH (ref 11.5–15.5)
WBC: 9.8 10*3/uL (ref 4.0–10.5)

## 2015-08-06 LAB — HEMOGLOBIN AND HEMATOCRIT, BLOOD
HCT: 25.1 % — ABNORMAL LOW (ref 39.0–52.0)
HEMATOCRIT: 26.8 % — AB (ref 39.0–52.0)
HEMATOCRIT: 27.3 % — AB (ref 39.0–52.0)
HEMOGLOBIN: 8.6 g/dL — AB (ref 13.0–17.0)
HEMOGLOBIN: 8 g/dL — AB (ref 13.0–17.0)
Hemoglobin: 8.6 g/dL — ABNORMAL LOW (ref 13.0–17.0)

## 2015-08-06 LAB — PROTIME-INR
INR: 1.31 (ref 0.00–1.49)
PROTHROMBIN TIME: 16.4 s — AB (ref 11.6–15.2)

## 2015-08-06 LAB — OCCULT BLOOD X 1 CARD TO LAB, STOOL: Fecal Occult Bld: NEGATIVE

## 2015-08-06 MED ORDER — FUROSEMIDE 40 MG PO TABS
40.0000 mg | ORAL_TABLET | Freq: Every day | ORAL | Status: DC
  Administered 2015-08-07 – 2015-08-09 (×3): 40 mg via ORAL
  Filled 2015-08-06 (×3): qty 1

## 2015-08-06 MED ORDER — POTASSIUM CHLORIDE ER 10 MEQ PO TBCR
20.0000 meq | EXTENDED_RELEASE_TABLET | Freq: Every day | ORAL | Status: DC
  Administered 2015-08-06 – 2015-08-09 (×4): 20 meq via ORAL
  Filled 2015-08-06 (×11): qty 2

## 2015-08-06 MED ORDER — COUMADIN BOOK
Freq: Once | Status: AC
  Administered 2015-08-06: 09:00:00
  Filled 2015-08-06: qty 1

## 2015-08-06 MED ORDER — WARFARIN SODIUM 7.5 MG PO TABS
7.5000 mg | ORAL_TABLET | Freq: Once | ORAL | Status: AC
  Administered 2015-08-06: 7.5 mg via ORAL
  Filled 2015-08-06: qty 1

## 2015-08-06 MED ORDER — WARFARIN VIDEO: Freq: Once | Status: DC

## 2015-08-06 NOTE — Progress Notes (Signed)
[facility] PROGRESS NOTE  Harry Franklin [id number] DOB: 1940/08/03 DOA: 08/02/2015 PCP: [name], MD  HPI/Brief narrative 75 yo male with respiratory leading to cardiac arrest. Found to have acute b/l DVT and presumed to have PE. He has hx of DVT and was not on anticoagulation due pancytopenia. 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 He also has hx of CAD, systolic CHF, A fib.  Assessment/Plan: 1. Bilateral DVT, likely PE 1. B LE DVT noted on 3/13 dopplers, with presumed PE 2. Currently remains on heparin gtt 3. Plts remain low, albeit stable 4. Discussed case with pt's Oncologist 3/16 who strongly agrees with starting coumadin. 5. Hgb in around 8.5. Follow serial hgb. If continued decline of hgb, would then consider stopping anticoagulation and placing filter 2. Cardiac arrest 1. Pt s/p PEA arrest suspected from acute PE 2. Cardiology following 3. Start coumadin per Pulm note 3. CAD 1. Not good candidate for intervention  2. Cont medical management per Cardiology 4. Paroxysmal Afib 1. CHADS-VASc of 2 2. Per Cardiology, Afib alone not indication for anticoagulation, however given acute DVT and PE, would cautiously continue coumadin 5. Acute on chronic systolic CHF 1. Recs for lasix on discharge 6. Hypoxic respiratory failure 1. Resolved 2. Stable at present 7. Stage 4 urethral cancer 1. Followed by Dr. Whitney Muse 2. Next appt on 3/22, scheduled for outpatient CT on 3/21 8. CKD2 1. Stable 9. Metabolic encephalopathy 1. Seems to be at baseline mental status  Code Status: Full Family Communication: Pt in room, family at bedside Disposition Plan: possible in 3-5 days when INR therapeutic   Consultants:  Pulmonary  Cardiology  Oncology over phone  Procedures:    Antibiotics: Anti-infectives    Start     Dose/Rate Route Frequency Ordered Stop   08/03/15 0000  linezolid (ZYVOX) IVPB 600 mg   Status:  Discontinued     600 mg 300 mL/hr over 60 Minutes Intravenous Every 12 hours 08/02/15 1549 08/03/15 0829   08/03/15 0000  piperacillin-tazobactam (ZOSYN) IVPB 3.375 g  Status:  Discontinued     3.375 g 12.5 mL/hr over 240 Minutes Intravenous Every 8 hours 08/02/15 1554 08/03/15 0829   08/02/15 1200  linezolid (ZYVOX) IVPB 600 mg     600 mg 300 mL/hr over 60 Minutes Intravenous  Once 08/02/15 1152 08/02/15 1330   08/02/15 1145  ceFEPIme (MAXIPIME) 1 g in dextrose 5 % 50 mL IVPB     1 g 100 mL/hr over 30 Minutes Intravenous  Once 08/02/15 1130 08/02/15 1248      HPI/Subjective: Still asking about going home  Objective: Filed Vitals:   08/05/15 1338 08/05/15 2122 08/06/15 0528 08/06/15 1339  BP: 111/66 104/50 111/57 119/61  Pulse: 85 93 79 91  Temp: 98.7 F (37.1 C) 98.1 F (36.7 C) 97.9 F (36.6 C) 98 F (36.7 C)  TempSrc: Oral Oral Oral Oral  Resp: 18 18 18 17   Height:      Weight:   121.519 kg (267 lb 14.4 oz)   SpO2: 97% 100% 98% 99%    Intake/Output Summary (Last 24 hours) at 08/06/15 1411 Last data filed at 08/06/15 1339  Gross per 24 hour  Intake    970 ml  Output   3250 ml  Net  -2280 ml   Filed Weights   08/04/15 0300 08/05/15 0410 08/06/15 0528  Weight: 124.6 kg (274 lb 11.1 oz) 122.426 kg (269 lb 14.4 oz) 121.519 kg (267  lb 14.4 oz)    Exam:   General:  Awake, in nad, sitting at side of bed  Cardiovascular: regular, s1, s2  Respiratory: normal resp effort, no wheezing  Abdomen: soft, nondistended, pos BS  Musculoskeletal: perfused, no clubbing   Data Reviewed: Basic Metabolic Panel:  Recent Labs Lab 08/02/15 1039 08/02/15 1040 08/02/15 2211 08/03/15 0318 08/04/15 0417 08/05/15 0410 08/06/15 0419  NA 137 140  --  139 140 139 139  K 4.2 4.1  --  3.8 3.7 3.8 3.6  CL 105 103  --  104 101 101 102  CO2 22  --   --  27 28 27 28   GLUCOSE 262* 257*  --  83 85 92 89  BUN 30* 31*  --  27* 26* 23* 22*  CREATININE 1.54* 1.30*  --   1.34* 1.62* 1.42* 1.29*  CALCIUM 8.6*  --   --  8.2* 7.9* 7.7* 7.7*  MG  --   --  1.2* 1.2*  --   --   --   PHOS  --   --  4.0 4.0  --   --   --    Liver Function Tests:  Recent Labs Lab 08/02/15 0912 08/02/15 1039  AST 43* 48*  ALT 44 44  ALKPHOS 149* 172*  BILITOT 1.1 1.2  PROT 6.4* 6.4*  ALBUMIN 3.4* 3.3*   No results for input(s): LIPASE, AMYLASE in the last 168 hours. No results for input(s): AMMONIA in the last 168 hours. CBC:  Recent Labs Lab 08/02/15 0912 08/02/15 1039  08/03/15 0319 08/04/15 0417 08/05/15 0410 08/06/15 0419 08/06/15 1100  WBC 17.5* 33.2*  --  11.0* 11.2* 11.9* 9.8  --   NEUTROABS 13.8* 22.9*  --   --   --   --   --   --   HGB 10.7* 11.6*  < > 9.6* 9.8* 9.6* 8.0* 8.6*  HCT 34.1* 37.6*  < > 30.9* 31.5* 31.0* 25.3* 26.8*  MCV 103.3* 105.0*  --  102.3* 101.3* 101.6* 100.8*  --   PLT 32* 37*  --  25* 32* 35* 38*  --   < > = values in this interval not displayed. Cardiac Enzymes:  Recent Labs Lab 08/02/15 2211 08/03/15 0318 08/05/15 1620  TROPONINI 0.69* 1.29* 0.50*   BNP (last 3 results)  Recent Labs  06/08/15 0830 08/02/15 1039  BNP 657.0* 721.0*    ProBNP (last 3 results) No results for input(s): PROBNP in the last 8760 hours.  CBG:  Recent Labs Lab 08/03/15 0823 08/03/15 0926 08/03/15 1149 08/03/15 1730 08/05/15 1116  GLUCAP 65 81 68 94 97    Recent Results (from the past 240 hour(s))  Blood culture (routine x 2)     Status: None (Preliminary result)   Collection Time: 08/02/15 12:00 PM  Result Value Ref Range Status   Specimen Description BLOOD PORTA CATH DRAWN BY RN  Final   Special Requests BOTTLES DRAWN AEROBIC AND ANAEROBIC 10 CC EACH  Final   Culture NO GROWTH 3 DAYS  Final   Report Status PENDING  Incomplete  Blood culture (routine x 2)     Status: None (Preliminary result)   Collection Time: 08/02/15 12:15 PM  Result Value Ref Range Status   Specimen Description BLOOD LEFT HAND  Final   Special Requests  BOTTLES DRAWN AEROBIC ONLY 8 CC  Final   Culture NO GROWTH 3 DAYS  Final   Report Status PENDING  Incomplete  MRSA PCR Screening  Status: None   Collection Time: 08/02/15  3:37 PM  Result Value Ref Range Status   MRSA by PCR NEGATIVE NEGATIVE Final    Comment:        The GeneXpert MRSA Assay (FDA approved for NASAL specimens only), is one component of a comprehensive MRSA colonization surveillance program. It is not intended to diagnose MRSA infection nor to guide or monitor treatment for MRSA infections.   Culture, respiratory (tracheal aspirate)     Status: None   Collection Time: 08/02/15  4:00 PM  Result Value Ref Range Status   Specimen Description TRACHEAL ASPIRATE  Final   Special Requests Normal  Final   Gram Stain   Final    ABUNDANT WBC PRESENT,BOTH PMN AND MONONUCLEAR NO SQUAMOUS EPITHELIAL CELLS SEEN NO ORGANISMS SEEN Performed at [facility]    Culture   Final    RARE STREPTOCOCCUS GROUP G Note: Beta hemolytic streptococci are predictably susceptible to penicillin and other beta lactams. Susceptibility testing not routinely performed. Performed at [facility]    Report Status 08/05/2015 FINAL  Final  Urine culture     Status: None   Collection Time: 08/02/15  4:27 PM  Result Value Ref Range Status   Specimen Description URINE, CATHETERIZED  Final   Special Requests Normal  Final   Culture NO GROWTH 1 DAY  Final   Report Status 08/03/2015 FINAL  Final     Studies: No results found.  Scheduled Meds: . antiseptic oral rinse  7 mL Mouth Rinse BID  . aspirin  81 mg Oral Daily  . atorvastatin  80 mg Oral q1800  . carvedilol  3.125 mg Oral BID WC  . furosemide  40 mg Oral Daily  . pantoprazole  40 mg Oral Q1200  . potassium chloride  20 mEq Oral Daily  . sodium chloride flush  10-40 mL Intracatheter Q12H  . warfarin  7.5 mg Oral ONCE-1800  . warfarin   Does not apply Once  . Warfarin - Pharmacist Dosing Inpatient   Does not apply  q1800   Continuous Infusions: . sodium chloride 10 mL/hr at 08/06/15 0550  . heparin 1,700 Units/hr (08/05/15 2143)    Active Problems:   Atrial fibrillation with rapid ventricular response ([facility])   Acute pulmonary edema ([facility])   Cardiac arrest ([place])   Respiratory arrest ([place])   AKI (acute kidney injury) ([place])   Thrombocytopenia ([place])   Absolute anemia   Acute hypoxemic respiratory failure ([place])   Malignant neoplasm of urinary bladder ([place])   Karie Skowron, [facility] Pager [phone]. If 7PM-7AM, please contact night-coverage at [email or web address], password [facility] 08/06/2015, 2:11 PM  LOS: 4 days

## 2015-08-06 NOTE — Progress Notes (Signed)
Physical Therapy Treatment Patient Details Name: Harry Franklin MRN: [id number] DOB: July 25, 1940 Today's Date: 08/06/2015    History of Present Illness Patient is a 75 y/o male with hx of metastatic urinary bladder malignancy, on chemotherapy, CAD, CHF, CKD, admitted for acute dyspnea. s/p cardiac arrest with CPR for 3 mins requiring intubation. Found to have Acute hypoxemic respiratory failure most likely secondary to acute on chronic pulmonary edema. Pt with DVTs BLEs.    PT Comments    Patient tolerating ambulation on RA with SpO2 98-99% and only mild dyspnea.  Seems stable with walker and using appropriately in open controlled environment.  Do not feel continued PT needed upon D/C.  Will attempt again if not home over weekend to actually practice one step entry, but pt verbalized appropriate technique during discussion this session.  Follow Up Recommendations  No PT follow up;Supervision - Intermittent     Equipment Recommendations  None recommended by PT    Recommendations for Other Services       Precautions / Restrictions Precautions Precautions: Fall Restrictions Weight Bearing Restrictions: No    Mobility  Bed Mobility               General bed mobility comments: NT, up in chair  Transfers Overall transfer level: Modified independent   Transfers: Sit to/from Stand           General transfer comment: up in room toileting upon my entry  Ambulation/Gait Ambulation/Gait assistance: Supervision Ambulation Distance (Feet): 300 Feet Assistive device: Rolling walker (2 wheeled) Gait Pattern/deviations: Step-through pattern;Trunk flexed;Decreased stride length     General Gait Details: relies on UE support with walker and flexed over walker, mild dyspnea, but SpO2 100% ambulating on 2L O2, then 98 % ambulating on RA   Stairs Stairs:  (discussed technique for steps with walker for home entry )          Wheelchair Mobility    Modified Rankin  (Stroke Patients Only)       Balance Overall balance assessment: Needs assistance   Sitting balance-Leahy Scale: Normal       Standing balance-Leahy Scale: Fair Standing balance comment: can stand without UE support, but reaching for sink or IV pole when without walker for support when taking steps                    Cognition Arousal/Alertness: Awake/alert Behavior During Therapy: WFL for tasks assessed/performed Overall Cognitive Status: Within Functional Limits for tasks assessed                      Exercises      General Comments        Pertinent Vitals/Pain Pain Assessment: No/denies pain  SpO2 100% ambulating on 2L O2; 98% on RA; resting on RA 99%    Home Living                      Prior Function            PT Goals (current goals can now be found in the care plan section) Progress towards PT goals: Progressing toward goals    Frequency  Min 3X/week    PT Plan Current plan remains appropriate    Co-evaluation             End of Session Equipment Utilized During Treatment: Gait belt;Oxygen Activity Tolerance: Patient tolerated treatment well Patient left: in chair;with call bell/phone within reach  Time: [id number] PT Time Calculation (min) (ACUTE ONLY): 29 min  Charges:  $Gait Training: 23-37 mins                    G Codes:      Reginia Naas 08-16-2015, 11:27 AM  Magda Kiel, [place] 16-Aug-2015

## 2015-08-06 NOTE — Progress Notes (Signed)
ANTICOAGULATION CONSULT NOTE - Follow Up Consult  Pharmacy Consult for Heparin/Coumadin Indication: DVT + PE  Allergies  Allergen Reactions  . Neulasta [Pegfilgrastim] Hives    Pt reports breaking out in hives less than 24 hours after taking neulasta. Hives appear on arms and legs.  . Rabeprazole     Other reaction(s): DIARRHEA  . Vancomycin Hives and Rash    Patient Measurements: Height: 6\' 4"  (193 cm) Weight: 267 lb 14.4 oz (121.519 kg) IBW/kg (Calculated) : 86.8 Heparin Dosing Weight:  114 kg  Vital Signs: Temp: 97.9 F (36.6 C) (03/17 0528) Temp Source: Oral (03/17 0528) BP: 111/57 mmHg (03/17 0528) Pulse Rate: 79 (03/17 0528)  Labs:  Recent Labs  08/04/15 0416  08/04/15 0417 08/05/15 0410 08/05/15 1620 08/06/15 0419  HGB  --   < > 9.8* 9.6*  --  8.0*  HCT  --   --  31.5* 31.0*  --  25.3*  PLT  --   --  32* 35*  --  38*  LABPROT  --   --   --   --   --  16.4*  INR  --   --   --   --   --  1.31  HEPARINUNFRC 0.30  --   --  0.40  --  0.35  CREATININE  --   --  1.62* 1.42*  --  1.29*  TROPONINI  --   --   --   --  0.50*  --   < > = values in this interval not displayed.  Estimated Creatinine Clearance: 73.7 mL/min (by C-G formula based on Cr of 1.29).  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 Recent dx afib (CHADS-VASc of 2). HL 0.35.  Hgb 9.6>8 and plts 38. No long-term AC d/t thrombocytopenia per Cards but wanted by oncology. May need IVC filter per CCM. Day #2/5 overlap of Coumadin/heparin.  Goal of Therapy:  Heparin level 0.3-0.7 units/ml  INR 2-3 Monitor platelets by anticoagulation protocol: Yes   Plan:  - D/C ASA for thrombocytopenia now on Hep/Coum?  - Continue heparin gtt at 1700 units/hr - Repeat Coumadin 7.5mg  po x 1 tonight.-will order education book/video - Daily HL / CBC / INR - If discharged over the weekend, pt will need home Lovenox 120mg  BID   Sheridan Hew S. Alford Highland,  PharmD, BCPS Clinical Staff Pharmacist Pager [phone]  Eilene Ghazi Stillinger 08/06/2015,8:30 AM

## 2015-08-06 NOTE — Progress Notes (Signed)
Patient Name: FAROUK MOULDS Date of Encounter: 08/06/2015   SUBJECTIVE  Feeling significantly well. No chest pain or palpitations. SOB improved. Ambulated well.   CURRENT MEDS . antiseptic oral rinse  7 mL Mouth Rinse BID  . aspirin  81 mg Oral Daily  . atorvastatin  80 mg Oral q1800  . carvedilol  3.125 mg Oral BID WC  . furosemide  20 mg Intravenous BID  . pantoprazole  40 mg Oral Q1200  . sodium chloride flush  10-40 mL Intracatheter Q12H  . warfarin  7.5 mg Oral ONCE-1800  . warfarin   Does not apply Once  . Warfarin - Pharmacist Dosing Inpatient   Does not apply q1800    OBJECTIVE  Filed Vitals:   08/05/15 0410 08/05/15 1338 08/05/15 2122 08/06/15 0528  BP: 117/55 111/66 104/50 111/57  Pulse: 84 85 93 79  Temp: 98.4 F (36.9 C) 98.7 F (37.1 C) 98.1 F (36.7 C) 97.9 F (36.6 C)  TempSrc: Oral Oral Oral Oral  Resp: 18 18 18 18   Height:      Weight: 269 lb 14.4 oz (122.426 kg)   267 lb 14.4 oz (121.519 kg)  SpO2: 96% 97% 100% 98%    Intake/Output Summary (Last 24 hours) at 08/06/15 1102 Last data filed at 08/06/15 1041  Gross per 24 hour  Intake    970 ml  Output   3600 ml  Net  -2630 ml   Filed Weights   08/04/15 0300 08/05/15 0410 08/06/15 0528  Weight: 274 lb 11.1 oz (124.6 kg) 269 lb 14.4 oz (122.426 kg) 267 lb 14.4 oz (121.519 kg)    PHYSICAL EXAM  General: Pleasant, NAD. Neuro: Alert and oriented X 3. Moves all extremities spontaneously. Psych: Normal affect. HEENT:  Normal  Neck: Supple without bruits or JVD. Lungs:  Resp regular and unlabored, CTA. Heart: RRR no s3, s4, systolic murmurs. Abdomen: Soft, non-tender, non-distended, BS + x 4.  Extremities: No clubbing, cyanosis. Trace BL LE edema. DP/PT/Radials 2+ and equal bilaterally.  Accessory Clinical Findings  CBC  Recent Labs  08/05/15 0410 08/06/15 0419  WBC 11.9* 9.8  HGB 9.6* 8.0*  HCT 31.0* 25.3*  MCV 101.6* 100.8*  PLT 35* 38*   Basic Metabolic Panel  Recent Labs  08/05/15 0410 08/06/15 0419  NA 139 139  K 3.8 3.6  CL 101 102  CO2 27 28  GLUCOSE 92 89  BUN 23* 22*  CREATININE 1.42* 1.29*  CALCIUM 7.7* 7.7*    Recent Labs  08/05/15 1620  TROPONINI 0.50*   TELE  sinusrhythm  Radiology/Studies  Ct Head Wo Contrast  08/02/2015  CLINICAL DATA:  Altered mental status/ cardiac arrest EXAM: CT HEAD WITHOUT CONTRAST TECHNIQUE: Contiguous axial images were obtained from the base of the skull through the vertex without intravenous contrast. COMPARISON:  None. FINDINGS: There is age related volume loss. There is no intracranial mass, hemorrhage, extra-axial fluid collection, or midline shift. There is slight small vessel disease in the centra semiovale bilaterally. There is preservation of the gray -white differentiation. There is subtle decreased attenuation in the mid to lower pons on the left. No other evidence of potential acute infarct. Bony calvarium appears intact. Mastoid air cells are clear. There is mucosal thickening in several ethmoid air cells bilaterally. No intraorbital lesions are identified. IMPRESSION: No brain edema is evident. No hemorrhage. There is subtle decreased attenuation in the medial left mid to lower pons. This finding may be artifactual. However, a subtle early  infarct in the left pons must be of concern. No other findings suggesting potential acute infarct evident on this study. There is slight small vessel disease in the centra semiovale bilaterally. There are areas of ethmoid sinus disease bilaterally. Electronically Signed   By: Lowella Grip III M.D.   On: 08/02/2015 17:26   Ct Chest Wo Contrast  08/02/2015  CLINICAL DATA:  Follow-up after cardiac arrest. History of hypertension. EXAM: CT CHEST WITHOUT CONTRAST TECHNIQUE: Multidetector CT imaging of the chest was performed following the standard protocol without IV contrast. COMPARISON:  Chest CT dated 06/08/2015. FINDINGS: Mediastinum/Lymph Nodes: Endotracheal tube well  positioned with tip above the level of the carina. Enteric tube passes into the stomach. Right subclavian central line appears well positioned. Mild cardiomegaly is unchanged. Trace pericardial effusion again noted at the heart base. Diffuse coronary artery calcifications again noted. Scattered atherosclerotic changes are seen along the walls of the normal-caliber thoracic aorta. No mass or enlarged lymph nodes seen within the mediastinum. Lungs/Pleura: Bilateral pleural effusions, moderate to large in size, increased compared to the previous study. Additional patchy consolidations throughout both lungs, most likely atelectasis. Mild emphysematous change noted at each lung apex. Upper abdomen: Enteric tube in the stomach. Limited images of the upper abdomen are otherwise unremarkable. Musculoskeletal: No chest wall mass or suspicious bone lesions identified. Degenerative changes throughout the thoracic spine, moderate in degree. IMPRESSION: 1. Bilateral pleural effusions, moderate to large in size, increased compared to the previous study. Additional patchy consolidations scattered within both lungs, most likely atelectasis, less likely pneumonia or aspiration. 2. Cardiomegaly, stable. Diffuse coronary artery calcifications again noted. Trace pericardial effusion at the heart base appears stable. 3. Tubes and lines appear adequately positioned, as detailed above. Electronically Signed   By: Franki Cabot M.D.   On: 08/02/2015 17:35   Dg Chest Port 1 View  08/04/2015  CLINICAL DATA:  Respiratory failure/ shortness of breath EXAM: PORTABLE CHEST 1 VIEW COMPARISON:  August 03, 2015 FINDINGS: Endotracheal tube and nasogastric tube have been removed. Port-A-Cath tip is in the superior vena cava. No pneumothorax. There is atelectatic change in the medial left base. The lungs elsewhere clear. Heart remains mildly enlarged with pulmonary vascularity within normal limits. No adenopathy. There is degenerative change in each  shoulder. IMPRESSION: No pneumothorax. No frank edema or consolidation. Stable cardiac prominence. Electronically Signed   By: Lowella Grip III M.D.   On: 08/04/2015 07:24   Dg Chest Port 1 View  08/03/2015  CLINICAL DATA:  Acute respiratory failure. EXAM: PORTABLE CHEST 1 VIEW COMPARISON:  08/02/2015 FINDINGS: Endotracheal tube remains with the tip approximately 2 cm above the carina. Gastric decompression tube extends below the diaphragm. Port-A-Cath shows stable positioning. Temporary pacing pads present. Lungs show improved aeration bilaterally and decrease in pulmonary edema with suggestion of some residual mild interstitial edema/ CHF and potential bilateral pleural effusions. Atelectasis versus consolidation noted in both lower lobes. IMPRESSION: Decreasing pulmonary edema with improved bilateral aeration. Suggestion of mild remaining interstitial edema and potential bilateral pleural effusions. Electronically Signed   By: Aletta Edouard M.D.   On: 08/03/2015 08:24   Dg Chest Portable 1 View  08/02/2015  CLINICAL DATA:  75 year old male with shortness breath and endotracheal tube placement. Subsequent encounter. EXAM: PORTABLE CHEST 1 VIEW COMPARISON:  07/29/2015 chest x-ray and 06/08/2015 chest CT. FINDINGS: Endotracheal tube tip 3.2 cm above the carina. Right central line tip seen to level of the distal superior vena cava. Nasogastric tube courses below the diaphragm.  Tip is not included on the present exam. Diffuse asymmetric airspace disease greater on right may represent pulmonary edema with pleural effusions limiting evaluation for lower lobe infiltrate or mass. No gross pneumothorax. Cardiomegaly. IMPRESSION: Endotracheal tube tip 3.2 cm above the carina. Right central line tip seen to level of the distal superior vena cava. Nasogastric tube courses below the diaphragm. Tip is not included on the present exam. Diffuse asymmetric airspace disease greater on right may represent pulmonary  edema with pleural effusions limiting evaluation for lower lobe infiltrate or mass. Cardiomegaly. Electronically Signed   By: Genia Del M.D.   On: 08/02/2015 11:05   Dg Chest Port 1 View  07/29/2015  CLINICAL DATA:  Cancer patient, metastatic urothelial carcinoma, shortness of breath, itching all over, with received 2 units of blood transfusion yesterday and Neulasta injection yesterday, history hypertension, former smoker EXAM: PORTABLE CHEST 1 VIEW COMPARISON:  CT chest 06/08/2015 ; chest radiograph 06/30/2013 FINDINGS: RIGHT jugular Port-A-Cath stable tip projecting over SVC. Mild enlargement of cardiac silhouette. Mediastinal contours and pulmonary vascularity normal. COPD changes with minimal bibasilar opacities unchanged since 2015. No definite acute infiltrate or pleural effusion. Bullous change versus less likely small loculated pneumothorax RIGHT apex unchanged. No LEFT pneumothorax identified. Osseous structures unremarkable. 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 RIGHT apex scarring at question bullous disease versus less likely small loculated pneumothorax unchanged since 06/08/2015 CT. No acute abnormalities. Electronically Signed   By: Lavonia Dana M.D.   On: 07/29/2015 10:33    ASSESSMENT AND PLAN  1. CAD - NSTEMI  - severely decreased platelets,  he is not a canddate for a cath given his low counts, metastatic cancer.   2. Acute on chronic systolic CHF - diuresed neg 6.9L with 13lb weight loss (282-->269lb). Appears Close euvolemic. Scr improved with Diuresis. Consider switching lasix to PO.  - Continue carvedilol 3.125 mg PO BID. Held lisinopril for now as Crea is elevated. Resume once stable.  - LVEF 25-30%, previosuly 40-45%,   3. Paroxysmal a-fib - in SR now, Rate is stable. low Ptl count.   4. Cardiac arrest - possibly sec to allergic reaction and acute PE - Currently on heparin --> PCCM has recommenced coumadin. Will defer to MD and primary.    5. Stage 4 urothelial cancer  - Chemo is being discontinued -Carboplatin/Gemcitabine , oncology is considering immunotherapy instead.  Signed, Dorothy Spark PA-C Pager [phone]   The patient was seen, examined and discussed with Bhagat,Bhavinkumar PA-C and I agree with the above.   ASSESSMENT AND PLAN  1. CAD - NSTEMI - heparin drip, severely decreased platelets, we will monitor closely, he is not a candidate for a cath given his low counts, metastatic cancer, repeat troponin, if downtrending d/c heparin.   2. Acute on chronic systolic CHF - started lasix 20 iv BID with -5L, carvedilol 3.125 mg PO BID, LVEF 25-30%, previosuly 40-45%, hold lisinopril for now as Crea is elevated.  Chemo is being discontinued -Carboplatin/Gemcitabine , oncology is considering immunotherapy instead. We can switch to lasix 40 mg po daily and KCl 10 mEq daily, discharge today, we will arrange for an outpatient follow up in 1 week.   3. Paroxysmal a-fib - in SR now, no long term anticoagulation sec low platelet count, from cardiac stanndpoint the risk of bleeding exceeds risk of stroke. Started on coumadin for DVT.   4. Cardiac arrest - possibly sec to allergic reaction  Anticipated discharge today. We will sign off.  Signed, Dorothy Spark MD, [facility] 08/05/2015

## 2015-08-07 ENCOUNTER — Encounter (HOSPITAL_COMMUNITY): Payer: Self-pay | Admitting: General Surgery

## 2015-08-07 LAB — CULTURE, BLOOD (ROUTINE X 2)
Culture: NO GROWTH
Culture: NO GROWTH

## 2015-08-07 LAB — CBC
HCT: 24.4 % — ABNORMAL LOW (ref 39.0–52.0)
HEMOGLOBIN: 7.7 g/dL — AB (ref 13.0–17.0)
MCH: 32.1 pg (ref 26.0–34.0)
MCHC: 31.6 g/dL (ref 30.0–36.0)
MCV: 101.7 fL — ABNORMAL HIGH (ref 78.0–100.0)
Platelets: 45 10*3/uL — ABNORMAL LOW (ref 150–400)
RBC: 2.4 MIL/uL — AB (ref 4.22–5.81)
RDW: 21 % — ABNORMAL HIGH (ref 11.5–15.5)
WBC: 9.1 10*3/uL (ref 4.0–10.5)

## 2015-08-07 LAB — PROTIME-INR
INR: 1.33 (ref 0.00–1.49)
PROTHROMBIN TIME: 16.6 s — AB (ref 11.6–15.2)

## 2015-08-07 LAB — HEMOGLOBIN AND HEMATOCRIT, BLOOD
HCT: 28 % — ABNORMAL LOW (ref 39.0–52.0)
HCT: 27.7 % — ABNORMAL LOW (ref 39.0–52.0)
HEMATOCRIT: 26.2 % — AB (ref 39.0–52.0)
HEMOGLOBIN: 8.2 g/dL — AB (ref 13.0–17.0)
HEMOGLOBIN: 8.6 g/dL — AB (ref 13.0–17.0)
Hemoglobin: 9.1 g/dL — ABNORMAL LOW (ref 13.0–17.0)

## 2015-08-07 LAB — HEPARIN LEVEL (UNFRACTIONATED): Heparin Unfractionated: 0.27 IU/mL — ABNORMAL LOW (ref 0.30–0.70)

## 2015-08-07 MED ORDER — SODIUM CHLORIDE 0.9% FLUSH
10.0000 mL | INTRAVENOUS | Status: DC | PRN
  Administered 2015-08-07 – 2015-08-09 (×2): 10 mL
  Filled 2015-08-07: qty 40

## 2015-08-07 NOTE — Progress Notes (Signed)
ANTICOAGULATION CONSULT NOTE - Follow Up Consult  Pharmacy Consult for Heparin Indication: DVT + PE  Allergies  Allergen Reactions  . Neulasta [Pegfilgrastim] Hives    Pt reports breaking out in hives less than 24 hours after taking neulasta. Hives appear on arms and legs.  . Rabeprazole     Other reaction(s): DIARRHEA  . Vancomycin Hives and Rash    Patient Measurements: Height: 6\' 4"  (193 cm) Weight: 268 lb 11.9 oz (121.9 kg) IBW/kg (Calculated) : 86.8 Heparin Dosing Weight:  114 kg  Vital Signs: Temp: 97.9 F (36.6 C) (03/18 0433) Temp Source: Oral (03/18 0433) BP: 107/58 mmHg (03/18 0433) Pulse Rate: 82 (03/18 0433)  Labs:  Recent Labs  08/05/15 0410 08/05/15 1620 08/06/15 0419  08/06/15 1430 08/06/15 1850 08/07/15 0432  HGB 9.6*  --  8.0*  < > 8.6* 8.0* 7.7*  HCT 31.0*  --  25.3*  < > 27.3* 25.1* 24.4*  PLT 35*  --  38*  --   --   --  45*  LABPROT  --   --  16.4*  --   --   --  16.6*  INR  --   --  1.31  --   --   --  1.33  HEPARINUNFRC 0.40  --  0.35  --   --   --  0.27*  CREATININE 1.42*  --  1.29*  --   --   --   --   TROPONINI  --  0.50*  --   --   --   --   --   < > = values in this interval not displayed.  Estimated Creatinine Clearance: 73.8 mL/min (by C-G formula based on Cr of 1.29).  Assessment: Anticoagulation: Heparin for new B/L DVTs (presumed PE), with hx DVT not on AC PTA d/t low plts. Recent dx afib (CHADS-VASc of 2). HL 0.27.  Hgb 9.6>8>7.7 and plts 45. Continue heparin until IVC filter can be placed. D/c Coumadin per Dr. Wyline Copas  Goal of Therapy:  Heparin level 0.3-0.7 units/ml  INR 2-3 Monitor platelets by anticoagulation protocol: Yes   Plan:  - D/C ASA?  - Increase IV heparin to 1850 units/hr - d/c Coumadin - Daily HL / CBC   Dewitte Vannice S. Alford Highland, PharmD, [facility] Clinical Staff Pharmacist Pager [phone]  Eilene Ghazi Stillinger 08/07/2015,10:17 AM

## 2015-08-07 NOTE — Consult Note (Signed)
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 Marylu Lund  Supervising Physician: Markus Daft  HPI: Harry Franklin is an 75 y.o. male who was diagnosed with (B) LE DVTs.  He then had a cardiopulmonary arrest at [facility] secondary to a presumed PE.  He was transferred to [facility].  He was started on heparin and trying to start coumadin, however his hgb continues to fall as do his platelets which are 45 today.  Hematology has weighed in and felt the patient would need an IVC filter so he could be off anticoagulation now and all how labs to improve with the hope of getting him back on coumadin as an outpatient.  We have been asked to see him today for IVC filter placement.  Past Medical History:  Past Medical History  Diagnosis Date  . GERD (gastroesophageal reflux disease)   . Hypokalemia   . Hypertension   . Cataract   . Arthritis   . Macular degeneration, age related   . DVT (deep venous thrombosis) ([place])     developed after traveling x 1, developed another after left foot surgery  . Hydrocele in adult   . 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 Replacement total knee    . Appendectomy    . Hammer toe surgery Left   . Lapidus procedure    . Hallux fusion    . Colonoscopy  03/31/05  . Colonoscopy N/A 10/02/2014    AVW:UJWJXBJ diverticulosis  . Esophagogastroduodenoscopy N/A 10/02/2014    YNW:GNFAO ulcerative reflux/s/p dilation/large HH  . Maloney dilation N/A 10/02/2014    Procedure: Venia Minks DILATION;  Surgeon: Daneil Dolin, MD;  [facility];  Service: Endoscopy;  Laterality: N/A;  . Esophagogastroduodenoscopy N/A 01/29/2015    Procedure: ESOPHAGOGASTRODUODENOSCOPY (EGD);  Surgeon: Daneil Dolin, MD;  Location: [facility];  Service: Endoscopy;  Laterality: N/A;  0800-rescheduled to 9/9 @ 915 Candy notified pt  . Esophageal dilation   01/29/2015    Procedure: ESOPHAGEAL DILATION;  Surgeon: Daneil Dolin, MD;  Location: [facility];  Service: Endoscopy;;  . Portacath placement Left 04/13/15    Family History:  Family History  Problem Relation Age of Onset  . Colon cancer Maternal Uncle   . Heart disease Father     Social History:  reports that he quit smoking about 31 years ago. His smoking use included Cigarettes. He has a 31 pack-year smoking history. He has never used smokeless tobacco. He reports that he does not drink alcohol or use illicit drugs.  Allergies:  Allergies  Allergen Reactions  . Neulasta [Pegfilgrastim] Hives    Pt reports breaking out in hives less than 24 hours after taking neulasta. Hives appear on arms and legs.  . Rabeprazole     Other reaction(s): DIARRHEA  . Vancomycin Hives and Rash    Medications:   Medication List    ASK your doctor about these medications        acetaminophen 325 MG tablet  Commonly known as:  TYLENOL  Take 650 mg by mouth every 6 (six) hours as needed for mild pain or fever.     aspirin 325 MG tablet  Take 162.5 mg by mouth at bedtime.     atorvastatin 80 MG tablet  Commonly known as:  LIPITOR  Take 1 tablet (80 mg total) by mouth daily at 6 PM.  CALCIUM 500 + D3 500-600 MG-UNIT Tabs  Generic drug:  Calcium Carb-Cholecalciferol  Take 2 tablets by mouth daily.     carvedilol 6.25 MG tablet  Commonly known as:  COREG  Take 1 & 1/2 tablets two times daily     feeding supplement Liqd  Take 1 Container by mouth daily.     hydrocortisone cream 1 %  Apply 1 application topically 2 (two) times daily as needed for itching (skin irritation on chest).     lisinopril 2.5 MG tablet  Commonly known as:  PRINIVIL,ZESTRIL  Take 1 tablet (2.5 mg total) by mouth daily.     omeprazole 40 MG capsule  Commonly known as:  PRILOSEC  Take 40 mg by mouth daily.     ondansetron 8 MG tablet  Commonly known as:  ZOFRAN  Take 1 tablet (8 mg total) by mouth  every 8 (eight) hours as needed for nausea or vomiting.     predniSONE 20 MG tablet  Commonly known as:  DELTASONE  Take 2 tablets (40 mg total) by mouth daily. Start 07/30/15     PRESERVISION AREDS 2 PO  Take 1 tablet by mouth 2 (two) times daily.        Please HPI for pertinent positives, otherwise complete 10 system ROS negative.  Mallampati Score: MD Evaluation Airway: WNL Heart: WNL Abdomen: WNL Chest/ Lungs: WNL ASA  Classification: 3 Mallampati/Airway Score: One  Physical Exam: BP 107/58 mmHg  Pulse 82  Temp(Src) 97.9 F (36.6 C) (Oral)  Resp 18  Ht '6\' 4"'$  (1.93 m)  Wt 268 lb 11.9 oz (121.9 kg)  BMI 32.73 kg/m2  SpO2 99% Body mass index is 32.73 kg/(m^2). General: pleasant, WD, WN white male who is sitting up in his chair in NAD HEENT: head is normocephalic, atraumatic.  Sclera are noninjected.  PERRL.  Ears and nose without any masses or lesions.  Mouth is pink and moist Heart: regular, rate, and rhythm.  Normal s1,s2. No obvious murmurs, gallops, or rubs noted.  Palpable radial and pedal pulses bilaterally Lungs: CTAB, no wheezes, rhonchi, or rales noted.  Respiratory effort nonlabored Abd: soft, NT, ND, +BS, no masses, hernias, or organomegaly MS: all 4 extremities are symmetrical with no cyanosis, clubbing, or edema. Skin: warm and dry with no masses, lesions, or rashes, but he does have ecchymoses all along his upper extremities from prior sticks, etc Psych: A&Ox3 with an appropriate affect.   Labs: Results for orders placed or performed during the hospital encounter of 08/02/15 (from the past 48 hour(s))  Troponin I     Status: Abnormal   Collection Time: 08/05/15  4:20 PM  Result Value Ref Range   Troponin I 0.50 (HH) <0.031 ng/mL    Comment:        POSSIBLE MYOCARDIAL ISCHEMIA. SERIAL TESTING RECOMMENDED. CRITICAL RESULT CALLED TO, READ BACK BY AND VERIFIED WITH: Doylene Canard 1719 08/05/2015 WBOND   Heparin level (unfractionated)     Status: None    Collection Time: 08/06/15  4:19 AM  Result Value Ref Range   Heparin Unfractionated 0.35 0.30 - 0.70 IU/mL    Comment:        IF HEPARIN RESULTS ARE BELOW EXPECTED VALUES, AND PATIENT DOSAGE HAS BEEN CONFIRMED, SUGGEST FOLLOW UP TESTING OF ANTITHROMBIN III LEVELS.   Basic metabolic panel     Status: Abnormal   Collection Time: 08/06/15  4:19 AM  Result Value Ref Range   Sodium 139 135 - 145 mmol/L   Potassium  3.6 3.5 - 5.1 mmol/L   Chloride 102 101 - 111 mmol/L   CO2 28 22 - 32 mmol/L   Glucose, Bld 89 65 - 99 mg/dL   BUN 22 (H) 6 - 20 mg/dL   Creatinine, Ser 1.29 (H) 0.61 - 1.24 mg/dL   Calcium 7.7 (L) 8.9 - 10.3 mg/dL   GFR calc non Af Amer 54 (L) >60 mL/min   GFR calc Af Amer >60 >60 mL/min    Comment: (NOTE) The eGFR has been calculated using the CKD EPI equation. This calculation has not been validated in all clinical situations. eGFR's persistently <60 mL/min signify possible Chronic Kidney Disease.    Anion gap 9 5 - 15  CBC     Status: Abnormal   Collection Time: 08/06/15  4:19 AM  Result Value Ref Range   WBC 9.8 4.0 - 10.5 K/uL   RBC 2.51 (L) 4.22 - 5.81 MIL/uL   Hemoglobin 8.0 (L) 13.0 - 17.0 g/dL   HCT 25.3 (L) 39.0 - 52.0 %   MCV 100.8 (H) 78.0 - 100.0 fL   MCH 31.9 26.0 - 34.0 pg   MCHC 31.6 30.0 - 36.0 g/dL   RDW 20.8 (H) 11.5 - 15.5 %   Platelets 38 (L) 150 - 400 K/uL    Comment: CONSISTENT WITH PREVIOUS RESULT  Protime-INR     Status: Abnormal   Collection Time: 08/06/15  4:19 AM  Result Value Ref Range   Prothrombin Time 16.4 (H) 11.6 - 15.2 seconds   INR 1.31 0.00 - 1.49  Hemoglobin and hematocrit, blood     Status: Abnormal   Collection Time: 08/06/15 11:00 AM  Result Value Ref Range   Hemoglobin 8.6 (L) 13.0 - 17.0 g/dL   HCT 26.8 (L) 39.0 - 52.0 %  Hemoglobin and hematocrit, blood     Status: Abnormal   Collection Time: 08/06/15  2:30 PM  Result Value Ref Range   Hemoglobin 8.6 (L) 13.0 - 17.0 g/dL   HCT 27.3 (L) 39.0 - 52.0 %    Hemoglobin and hematocrit, blood     Status: Abnormal   Collection Time: 08/06/15  6:50 PM  Result Value Ref Range   Hemoglobin 8.0 (L) 13.0 - 17.0 g/dL   HCT 25.1 (L) 39.0 - 52.0 %  Occult blood card to lab, stool     Status: None   Collection Time: 08/06/15  8:36 PM  Result Value Ref Range   Fecal Occult Bld NEGATIVE NEGATIVE  Heparin level (unfractionated)     Status: Abnormal   Collection Time: 08/07/15  4:32 AM  Result Value Ref Range   Heparin Unfractionated 0.27 (L) 0.30 - 0.70 IU/mL    Comment:        IF HEPARIN RESULTS ARE BELOW EXPECTED VALUES, AND PATIENT DOSAGE HAS BEEN CONFIRMED, SUGGEST FOLLOW UP TESTING OF ANTITHROMBIN III LEVELS.   CBC     Status: Abnormal   Collection Time: 08/07/15  4:32 AM  Result Value Ref Range   WBC 9.1 4.0 - 10.5 K/uL   RBC 2.40 (L) 4.22 - 5.81 MIL/uL   Hemoglobin 7.7 (L) 13.0 - 17.0 g/dL   HCT 24.4 (L) 39.0 - 52.0 %   MCV 101.7 (H) 78.0 - 100.0 fL   MCH 32.1 26.0 - 34.0 pg   MCHC 31.6 30.0 - 36.0 g/dL   RDW 21.0 (H) 11.5 - 15.5 %   Platelets 45 (L) 150 - 400 K/uL    Comment: CONSISTENT WITH PREVIOUS RESULT  Protime-INR     Status: Abnormal   Collection Time: 08/07/15  4:32 AM  Result Value Ref Range   Prothrombin Time 16.6 (H) 11.6 - 15.2 seconds   INR 1.33 0.00 - 1.49    Imaging: No results found.  Assessment/Plan 1. (B) LE DVTs with presumed PE -we will plan on placing an IVC filter in the patient today. -cont NPO -labs have been reviewed by myself and with Dr. Anselm Pancoast. -heparin drip may continue for the procedure - i have discussed the procedure either being done through his IJ for his groin, +/- sedation, just pain meds, or no medications at all, given he ate 2 pretzels around 1030. -Risks and Benefits discussed with the patient including, but not limited to bleeding, infection, contrast induced renal failure, filter fracture or migration which can lead to emergency surgery or even death, strut penetration with damage or  irritation to adjacent structures and caval thrombosis. All of the patient's questions were answered, patient is agreeable to proceed. Consent signed and in chart.   Thank you for this interesting consult.  I greatly enjoyed meeting TRIPTON NED and look forward to participating in their care.  A copy of this report was sent to the requesting provider on this date.  Electronically Signed: Henreitta Cea 08/07/2015, 12:09 PM   I spent a total of 40 Minutes   in face to face in clinical consultation, greater than 50% of which was counseling/coordinating care for LE DVTs, needs IVC filter

## 2015-08-07 NOTE — Treatment Plan (Signed)
Pt seen and examined. Full note to follow. Briefly, hgb noted to slowly trend down to 7.7 this AM (was just below 10 prior to starting coumadin). No obvious source of bleed. Discussed case with primary Oncologist, Dr. Whitney Muse. Recs to d/c coumadin for now and place IVC filter. As blood counts improve, would then consider transition to coumadin as outpatient. Updated family at bedside and all are in agreement with plan.

## 2015-08-07 NOTE — Progress Notes (Signed)
[facility] PROGRESS NOTE  KAIREN CLASSON [id number] DOB: 03-31-1941 DOA: 08/02/2015 PCP: [name], MD  HPI/Brief narrative 75 yo male with respiratory leading to cardiac arrest. Found to have acute b/l DVT and presumed to have PE. He has hx of DVT and was not on anticoagulation due pancytopenia. 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 He also has hx of CAD, systolic CHF, A fib.  Assessment/Plan: 1. Bilateral DVT, likely PE 1. B LE DVT noted on 3/13 dopplers, with presumed PE 2. Currently remains on heparin gtt 3. Plts remain low, albeit stable 4. Earlier discussed case with pt's Oncologist on 3/16 who strongly agreed with starting coumadin. 5. Hgb slowly trended down to 7.7 this AM. Discussed case with primary Oncologist, Dr. Whitney Muse, who recommended stop coumadin and place IVC filter placement until blood counts improve and ultimately plans to transition to coumadin at that time 2. Cardiac arrest 1. Pt s/p PEA arrest suspected from acute PE 2. Cardiology had been following 3. Initially started coumadin. Anticoagulation per above 3. CAD 1. Not good candidate for intervention  2. Cont medical management per Cardiology 4. Paroxysmal Afib 1. CHADS-VASc of 2 2. Per Cardiology, Afib alone not indication for anticoagulation, however given acute DVT and PE, would ultimately benefit from coumadin per above 5. Acute on chronic systolic CHF 1. Recs for lasix on discharge 6. Hypoxic respiratory failure 1. Resolved 2. Stable at present 7. Stage 4 urethral cancer 1. Followed by Dr. Whitney Muse 2. Next appt on 3/22, scheduled for outpatient CT on 3/21 8. CKD2 1. Stable 9. Metabolic encephalopathy 1. Seems to be at baseline mental status  Code Status: Full Family Communication: Pt in room, family at bedside Disposition Plan: possible in 48-72hrs   Consultants:  Pulmonary  Cardiology  Oncology over  phone  IR  Procedures:    Antibiotics: Anti-infectives    Start     Dose/Rate Route Frequency Ordered Stop   08/03/15 0000  linezolid (ZYVOX) IVPB 600 mg  Status:  Discontinued     600 mg 300 mL/hr over 60 Minutes Intravenous Every 12 hours 08/02/15 1549 08/03/15 0829   08/03/15 0000  piperacillin-tazobactam (ZOSYN) IVPB 3.375 g  Status:  Discontinued     3.375 g 12.5 mL/hr over 240 Minutes Intravenous Every 8 hours 08/02/15 1554 08/03/15 0829   08/02/15 1200  linezolid (ZYVOX) IVPB 600 mg     600 mg 300 mL/hr over 60 Minutes Intravenous  Once 08/02/15 1152 08/02/15 1330   08/02/15 1145  ceFEPIme (MAXIPIME) 1 g in dextrose 5 % 50 mL IVPB     1 g 100 mL/hr over 30 Minutes Intravenous  Once 08/02/15 1130 08/02/15 1248      HPI/Subjective: No complaints this AM  Objective: Filed Vitals:   08/06/15 0528 08/06/15 1339 08/06/15 2126 08/07/15 0433  BP: 111/57 119/61 112/58 107/58  Pulse: 79 91 81 82  Temp: 97.9 F (36.6 C) 98 F (36.7 C) 97.5 F (36.4 C) 97.9 F (36.6 C)  TempSrc: Oral Oral Oral Oral  Resp: 18 17 18 18   Height:      Weight: 121.519 kg (267 lb 14.4 oz)   121.9 kg (268 lb 11.9 oz)  SpO2: 98% 99% 99% 99%    Intake/Output Summary (Last 24 hours) at 08/07/15 1516 Last data filed at 08/07/15 0356  Gross per 24 hour  Intake    240 ml  Output    800 ml  Net   -560  ml   Filed Weights   08/05/15 0410 08/06/15 0528 08/07/15 0433  Weight: 122.426 kg (269 lb 14.4 oz) 121.519 kg (267 lb 14.4 oz) 121.9 kg (268 lb 11.9 oz)    Exam:   General:  Awake, in nad, sitting in chair  Cardiovascular: regular, s1, s2  Respiratory: normal resp effort, no wheezing  Abdomen: soft, nondistended, pos BS  Musculoskeletal: perfused, no clubbing, no cyanosis  Data Reviewed: Basic Metabolic Panel:  Recent Labs Lab 08/02/15 1039 08/02/15 1040 08/02/15 2211 08/03/15 0318 08/04/15 0417 08/05/15 0410 08/06/15 0419  NA 137 140  --  139 140 139 139  K 4.2 4.1  --   3.8 3.7 3.8 3.6  CL 105 103  --  104 101 101 102  CO2 22  --   --  27 28 27 28   GLUCOSE 262* 257*  --  83 85 92 89  BUN 30* 31*  --  27* 26* 23* 22*  CREATININE 1.54* 1.30*  --  1.34* 1.62* 1.42* 1.29*  CALCIUM 8.6*  --   --  8.2* 7.9* 7.7* 7.7*  MG  --   --  1.2* 1.2*  --   --   --   PHOS  --   --  4.0 4.0  --   --   --    Liver Function Tests:  Recent Labs Lab 08/02/15 0912 08/02/15 1039  AST 43* 48*  ALT 44 44  ALKPHOS 149* 172*  BILITOT 1.1 1.2  PROT 6.4* 6.4*  ALBUMIN 3.4* 3.3*   No results for input(s): LIPASE, AMYLASE in the last 168 hours. No results for input(s): AMMONIA in the last 168 hours. CBC:  Recent Labs Lab 08/02/15 0912 08/02/15 1039  08/03/15 0319 08/04/15 0417 08/05/15 0410 08/06/15 0419 08/06/15 1100 08/06/15 1430 08/06/15 1850 08/07/15 0432 08/07/15 1150  WBC 17.5* 33.2*  --  11.0* 11.2* 11.9* 9.8  --   --   --  9.1  --   NEUTROABS 13.8* 22.9*  --   --   --   --   --   --   --   --   --   --   HGB 10.7* 11.6*  < > 9.6* 9.8* 9.6* 8.0* 8.6* 8.6* 8.0* 7.7* 8.2*  HCT 34.1* 37.6*  < > 30.9* 31.5* 31.0* 25.3* 26.8* 27.3* 25.1* 24.4* 26.2*  MCV 103.3* 105.0*  --  102.3* 101.3* 101.6* 100.8*  --   --   --  101.7*  --   PLT 32* 37*  --  25* 32* 35* 38*  --   --   --  45*  --   < > = values in this interval not displayed. Cardiac Enzymes:  Recent Labs Lab 08/02/15 2211 08/03/15 0318 08/05/15 1620  TROPONINI 0.69* 1.29* 0.50*   BNP (last 3 results)  Recent Labs  06/08/15 0830 08/02/15 1039  BNP 657.0* 721.0*    ProBNP (last 3 results) No results for input(s): PROBNP in the last 8760 hours.  CBG:  Recent Labs Lab 08/03/15 0823 08/03/15 0926 08/03/15 1149 08/03/15 1730 08/05/15 1116  GLUCAP 65 81 68 94 97    Recent Results (from the past 240 hour(s))  Blood culture (routine x 2)     Status: None   Collection Time: 08/02/15 12:00 PM  Result Value Ref Range Status   Specimen Description BLOOD PORTA CATH DRAWN BY RN  Final    Special Requests BOTTLES DRAWN AEROBIC AND ANAEROBIC 10 CC EACH  Final  Culture NO GROWTH 5 DAYS  Final   Report Status 08/07/2015 FINAL  Final  Blood culture (routine x 2)     Status: None   Collection Time: 08/02/15 12:15 PM  Result Value Ref Range Status   Specimen Description BLOOD LEFT HAND  Final   Special Requests BOTTLES DRAWN AEROBIC ONLY 8 CC  Final   Culture NO GROWTH 5 DAYS  Final   Report Status 08/07/2015 FINAL  Final  MRSA PCR Screening     Status: None   Collection Time: 08/02/15  3:37 PM  Result Value Ref Range Status   MRSA by PCR NEGATIVE NEGATIVE Final    Comment:        The GeneXpert MRSA Assay (FDA approved for NASAL specimens only), is one component of a comprehensive MRSA colonization surveillance program. It is not intended to diagnose MRSA infection nor to guide or monitor treatment for MRSA infections.   Culture, respiratory (tracheal aspirate)     Status: None   Collection Time: 08/02/15  4:00 PM  Result Value Ref Range Status   Specimen Description TRACHEAL ASPIRATE  Final   Special Requests Normal  Final   Gram Stain   Final    ABUNDANT WBC PRESENT,BOTH PMN AND MONONUCLEAR NO SQUAMOUS EPITHELIAL CELLS SEEN NO ORGANISMS SEEN Performed at [facility]    Culture   Final    RARE STREPTOCOCCUS GROUP G Note: Beta hemolytic streptococci are predictably susceptible to penicillin and other beta lactams. Susceptibility testing not routinely performed. Performed at [facility]    Report Status 08/05/2015 FINAL  Final  Urine culture     Status: None   Collection Time: 08/02/15  4:27 PM  Result Value Ref Range Status   Specimen Description URINE, CATHETERIZED  Final   Special Requests Normal  Final   Culture NO GROWTH 1 DAY  Final   Report Status 08/03/2015 FINAL  Final     Studies: No results found.  Scheduled Meds: . antiseptic oral rinse  7 mL Mouth Rinse BID  . aspirin  81 mg Oral Daily  . atorvastatin  80 mg Oral q1800   . carvedilol  3.125 mg Oral BID WC  . furosemide  40 mg Oral Daily  . pantoprazole  40 mg Oral Q1200  . potassium chloride  20 mEq Oral Daily  . sodium chloride flush  10-40 mL Intracatheter Q12H   Continuous Infusions: . sodium chloride 10 mL/hr at 08/06/15 0550  . 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 If 7PM-7AM, please contact night-coverage at [email or web address], password [facility] 08/07/2015, 3:16 PM  LOS: 5 days

## 2015-08-08 ENCOUNTER — Inpatient Hospital Stay (HOSPITAL_COMMUNITY): Payer: Medicare HMO

## 2015-08-08 LAB — CBC
HEMATOCRIT: 23.9 % — AB (ref 39.0–52.0)
HEMOGLOBIN: 7.6 g/dL — AB (ref 13.0–17.0)
MCH: 32.3 pg (ref 26.0–34.0)
MCHC: 31.8 g/dL (ref 30.0–36.0)
MCV: 101.7 fL — AB (ref 78.0–100.0)
Platelets: 52 10*3/uL — ABNORMAL LOW (ref 150–400)
RBC: 2.35 MIL/uL — AB (ref 4.22–5.81)
RDW: 21.3 % — ABNORMAL HIGH (ref 11.5–15.5)
WBC: 6.9 10*3/uL (ref 4.0–10.5)

## 2015-08-08 LAB — HEMOGLOBIN AND HEMATOCRIT, BLOOD
HEMATOCRIT: 24.4 % — AB (ref 39.0–52.0)
HEMATOCRIT: 23.7 % — AB (ref 39.0–52.0)
HEMOGLOBIN: 7.8 g/dL — AB (ref 13.0–17.0)
HEMOGLOBIN: 7.4 g/dL — AB (ref 13.0–17.0)

## 2015-08-08 LAB — PREPARE RBC (CROSSMATCH)

## 2015-08-08 LAB — HEPARIN LEVEL (UNFRACTIONATED): Heparin Unfractionated: 0.34 IU/mL (ref 0.30–0.70)

## 2015-08-08 LAB — PROTIME-INR
INR: 1.34 (ref 0.00–1.49)
Prothrombin Time: 16.7 seconds — ABNORMAL HIGH (ref 11.6–15.2)

## 2015-08-08 LAB — ABO/RH: ABO/RH(D): A NEG

## 2015-08-08 MED ORDER — FENTANYL CITRATE (PF) 100 MCG/2ML IJ SOLN
INTRAMUSCULAR | Status: AC
  Filled 2015-08-08: qty 2

## 2015-08-08 MED ORDER — MIDAZOLAM HCL 2 MG/2ML IJ SOLN
INTRAMUSCULAR | Status: AC | PRN
  Administered 2015-08-08 (×2): 1 mg via INTRAVENOUS

## 2015-08-08 MED ORDER — LIDOCAINE HCL 1 % IJ SOLN
INTRAMUSCULAR | Status: AC
  Filled 2015-08-08: qty 20

## 2015-08-08 MED ORDER — SODIUM CHLORIDE 0.9 % IV SOLN
Freq: Once | INTRAVENOUS | Status: AC
  Administered 2015-08-08: 21:00:00 via INTRAVENOUS

## 2015-08-08 MED ORDER — IOHEXOL 300 MG/ML  SOLN
150.0000 mL | Freq: Once | INTRAMUSCULAR | Status: AC | PRN
  Administered 2015-08-08: 50 mL via INTRAVENOUS

## 2015-08-08 MED ORDER — SODIUM CHLORIDE 0.9 % IV SOLN: Freq: Once | INTRAVENOUS | Status: DC

## 2015-08-08 MED ORDER — MIDAZOLAM HCL 2 MG/2ML IJ SOLN
INTRAMUSCULAR | Status: AC
  Filled 2015-08-08: qty 2

## 2015-08-08 MED ORDER — FENTANYL CITRATE (PF) 100 MCG/2ML IJ SOLN
INTRAMUSCULAR | Status: AC | PRN
  Administered 2015-08-08 (×2): 25 ug via INTRAVENOUS

## 2015-08-08 NOTE — Progress Notes (Signed)
ANTICOAGULATION CONSULT NOTE - Follow Up Consult  Pharmacy Consult for Heparin Indication: DVT + PE  Allergies  Allergen Reactions  . Neulasta [Pegfilgrastim] Hives    Pt reports breaking out in hives less than 24 hours after taking neulasta. Hives appear on arms and legs.  . Rabeprazole     Other reaction(s): DIARRHEA  . Vancomycin Hives and Rash    Patient Measurements: Height: 6\' 4"  (193 cm) Weight: 267 lb 12.8 oz (121.473 kg) IBW/kg (Calculated) : 86.8 Heparin Dosing Weight:  114 kg  Vital Signs: Temp: 98.3 F (36.8 C) (03/19 0632) Temp Source: Oral (03/19 [id number]) BP: 107/60 mmHg (03/19 [id number]) Pulse Rate: 84 (03/19 0632)  Labs:  Recent Labs  08/05/15 1620 08/06/15 0419  08/07/15 0432  08/07/15 2050 08/08/15 0500 08/08/15 0540 08/08/15 1000  HGB  --  8.0*  < > 7.7*  < > 9.1*  --  7.6* 7.8*  HCT  --  25.3*  < > 24.4*  < > 27.7*  --  23.9* 24.4*  PLT  --  38*  --  45*  --   --   --  52*  --   LABPROT  --  16.4*  --  16.6*  --   --  16.7*  --   --   INR  --  1.31  --  1.33  --   --  1.34  --   --   HEPARINUNFRC  --  0.35  --  0.27*  --   --  0.34  --   --   CREATININE  --  1.29*  --   --   --   --   --   --   --   TROPONINI 0.50*  --   --   --   --   --   --   --   --   < > = values in this interval not displayed.  Estimated Creatinine Clearance: 73.7 mL/min (by C-G formula based on Cr of 1.29).  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 Recent dx afib (CHADS-VASc of 2). HL 0.34 in goal. Hgb 9.6>8>7.8 remains low and plts 52. Continue heparin until IVC filter can be placed; was placed 3/18. D/c'd Coumadin  Goal of Therapy:  Heparin level 0.3-0.7 units/ml  INR 2-3 Monitor platelets by anticoagulation protocol: Yes   Plan:  - IV heparin  1850 units/hr - Daily HL / CBC  - f/u anticoagulation plans   Arnel Wymer S. Alford Highland, PharmD, BCPS Clinical Staff Pharmacist Pager [phone]  Eilene Ghazi Stillinger 08/08/2015,11:59 AM

## 2015-08-08 NOTE — Progress Notes (Signed)
Phone call from Dr. Earlie Counts - informed this RN patient would be getting Unit of PRBC's today, to administer when patient returns from interventional radiology later today following filter placement. Informed patient of Dr. Vincente Poli order.

## 2015-08-08 NOTE — Progress Notes (Signed)
Utilization review completedUtilization review completed 

## 2015-08-08 NOTE — Sedation Documentation (Signed)
Patient is resting comfortably. Vitals stable. 

## 2015-08-08 NOTE — Progress Notes (Addendum)
[facility] PROGRESS NOTE  Harry Franklin [id number] DOB: 14-Jun-1940 DOA: 08/02/2015 PCP: [name], MD  HPI/Brief narrative 75 yo male with respiratory leading to cardiac arrest. Found to have acute b/l DVT and presumed to have PE. He has hx of DVT and was not on anticoagulation due pancytopenia. 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 He also has hx of CAD, systolic CHF, A fib.  Assessment/Plan: 1. Bilateral DVT, likely PE 1. B LE DVT noted on 3/13 dopplers, with presumed PE 2. Currently remains on heparin gtt 3. Plts remain low, slightly higher to 52k this AM 4. Had initially discussed case with pt's Oncologist on 3/16 who strongly agreed with starting coumadin. 5. Hgb slowly trended down to the 7 range thus discussed case with primary Oncologist, Dr. Whitney Muse on 3/18, who recommended stopping coumadin and placing IVC filter until blood counts improve with ultimate plans to transition to coumadin at that time 2. Acute blood loss anemia 1. Will give 1 unit of prbc's as hgb is holding in the upper 7 range 3. Cardiac arrest 1. Pt s/p PEA arrest suspected from acute PE 2. Cardiology had been following 3. Initially started coumadin. Anticoagulation per above 4. CAD 1. Not good candidate for intervention  2. Cont medical management per Cardiology 5. Paroxysmal Afib 1. CHADS-VASc of 2 2. Per Cardiology, Afib alone not indication for anticoagulation, however given acute DVT and PE, would ultimately benefit from coumadin per above 6. Acute on chronic systolic CHF 1. Recs for lasix on discharge 7. Hypoxic respiratory failure 1. Resolved 2. Stable at present 8. Stage 4 urethral cancer 1. Followed by Dr. Whitney Muse 2. Next appt on 3/22, scheduled for outpatient CT on 3/21 9. CKD2 1. Stable 10. Metabolic encephalopathy 1. Seems to be at baseline mental status  Code Status: Full Family Communication: Pt in room, family at  bedside Disposition Plan: possible in 24-48hrs   Consultants:  Pulmonary  Cardiology  Oncology over phone  IR  Procedures:    Antibiotics: Anti-infectives    Start     Dose/Rate Route Frequency Ordered Stop   08/03/15 0000  linezolid (ZYVOX) IVPB 600 mg  Status:  Discontinued     600 mg 300 mL/hr over 60 Minutes Intravenous Every 12 hours 08/02/15 1549 08/03/15 0829   08/03/15 0000  piperacillin-tazobactam (ZOSYN) IVPB 3.375 g  Status:  Discontinued     3.375 g 12.5 mL/hr over 240 Minutes Intravenous Every 8 hours 08/02/15 1554 08/03/15 0829   08/02/15 1200  linezolid (ZYVOX) IVPB 600 mg     600 mg 300 mL/hr over 60 Minutes Intravenous  Once 08/02/15 1152 08/02/15 1330   08/02/15 1145  ceFEPIme (MAXIPIME) 1 g in dextrose 5 % 50 mL IVPB     1 g 100 mL/hr over 30 Minutes Intravenous  Once 08/02/15 1130 08/02/15 1248      HPI/Subjective: Eager to have IVC filter placed  Objective: Filed Vitals:   08/07/15 0433 08/07/15 1547 08/07/15 2101 08/08/15 0632  BP: 107/58 139/77 108/58 107/60  Pulse: 82 75 88 84  Temp: 97.9 F (36.6 C) 98.1 F (36.7 C) 97.4 F (36.3 C) 98.3 F (36.8 C)  TempSrc: Oral Oral Oral Oral  Resp: 18 17 18 18   Height:      Weight: 121.9 kg (268 lb 11.9 oz)   121.473 kg (267 lb 12.8 oz)  SpO2: 99% 100% 100% 98%   No intake or output data in the 24 hours  ending 08/08/15 1423 Filed Weights   08/06/15 0528 08/07/15 0433 08/08/15 [id number]  Weight: 121.519 kg (267 lb 14.4 oz) 121.9 kg (268 lb 11.9 oz) 121.473 kg (267 lb 12.8 oz)    Exam:   General:  Awake, in nad, sitting in bed  Cardiovascular: regular, s1, s2  Respiratory: normal resp effort, no wheezing  Abdomen: soft, nondistended, pos BS  Musculoskeletal: perfused, no clubbing, no cyanosis, hematoma over L forearm  Data Reviewed: Basic Metabolic Panel:  Recent Labs Lab 08/02/15 1039 08/02/15 1040 08/02/15 2211 08/03/15 0318 08/04/15 0417 08/05/15 0410 08/06/15 0419  NA 137  140  --  139 140 139 139  K 4.2 4.1  --  3.8 3.7 3.8 3.6  CL 105 103  --  104 101 101 102  CO2 22  --   --  27 28 27 28   GLUCOSE 262* 257*  --  83 85 92 89  BUN 30* 31*  --  27* 26* 23* 22*  CREATININE 1.54* 1.30*  --  1.34* 1.62* 1.42* 1.29*  CALCIUM 8.6*  --   --  8.2* 7.9* 7.7* 7.7*  MG  --   --  1.2* 1.2*  --   --   --   PHOS  --   --  4.0 4.0  --   --   --    Liver Function Tests:  Recent Labs Lab 08/02/15 0912 08/02/15 1039  AST 43* 48*  ALT 44 44  ALKPHOS 149* 172*  BILITOT 1.1 1.2  PROT 6.4* 6.4*  ALBUMIN 3.4* 3.3*   No results for input(s): LIPASE, AMYLASE in the last 168 hours. No results for input(s): AMMONIA in the last 168 hours. CBC:  Recent Labs Lab 08/02/15 0912 08/02/15 1039  08/04/15 0417 08/05/15 0410 08/06/15 0419  08/07/15 0432 08/07/15 1150 08/07/15 1610 08/07/15 2050 08/08/15 0540 08/08/15 1000  WBC 17.5* 33.2*  < > 11.2* 11.9* 9.8  --  9.1  --   --   --  6.9  --   NEUTROABS 13.8* 22.9*  --   --   --   --   --   --   --   --   --   --   --   HGB 10.7* 11.6*  < > 9.8* 9.6* 8.0*  < > 7.7* 8.2* 8.6* 9.1* 7.6* 7.8*  HCT 34.1* 37.6*  < > 31.5* 31.0* 25.3*  < > 24.4* 26.2* 28.0* 27.7* 23.9* 24.4*  MCV 103.3* 105.0*  < > 101.3* 101.6* 100.8*  --  101.7*  --   --   --  101.7*  --   PLT 32* 37*  < > 32* 35* 38*  --  45*  --   --   --  52*  --   < > = values in this interval not displayed. Cardiac Enzymes:  Recent Labs Lab 08/02/15 2211 08/03/15 0318 08/05/15 1620  TROPONINI 0.69* 1.29* 0.50*   BNP (last 3 results)  Recent Labs  06/08/15 0830 08/02/15 1039  BNP 657.0* 721.0*    ProBNP (last 3 results) No results for input(s): PROBNP in the last 8760 hours.  CBG:  Recent Labs Lab 08/03/15 0823 08/03/15 0926 08/03/15 1149 08/03/15 1730 08/05/15 1116  GLUCAP 65 81 68 94 97    Recent Results (from the past 240 hour(s))  Blood culture (routine x 2)     Status: None   Collection Time: 08/02/15 12:00 PM  Result Value Ref Range  Status   Specimen  Description BLOOD PORTA CATH DRAWN BY RN  Final   Special Requests BOTTLES DRAWN AEROBIC AND ANAEROBIC 10 CC EACH  Final   Culture NO GROWTH 5 DAYS  Final   Report Status 08/07/2015 FINAL  Final  Blood culture (routine x 2)     Status: None   Collection Time: 08/02/15 12:15 PM  Result Value Ref Range Status   Specimen Description BLOOD LEFT HAND  Final   Special Requests BOTTLES DRAWN AEROBIC ONLY 8 CC  Final   Culture NO GROWTH 5 DAYS  Final   Report Status 08/07/2015 FINAL  Final  MRSA PCR Screening     Status: None   Collection Time: 08/02/15  3:37 PM  Result Value Ref Range Status   MRSA by PCR NEGATIVE NEGATIVE Final    Comment:        The GeneXpert MRSA Assay (FDA approved for NASAL specimens only), is one component of a comprehensive MRSA colonization surveillance program. It is not intended to diagnose MRSA infection nor to guide or monitor treatment for MRSA infections.   Culture, respiratory (tracheal aspirate)     Status: None   Collection Time: 08/02/15  4:00 PM  Result Value Ref Range Status   Specimen Description TRACHEAL ASPIRATE  Final   Special Requests Normal  Final   Gram Stain   Final    ABUNDANT WBC PRESENT,BOTH PMN AND MONONUCLEAR NO SQUAMOUS EPITHELIAL CELLS SEEN NO ORGANISMS SEEN Performed at [facility]    Culture   Final    RARE STREPTOCOCCUS GROUP G Note: Beta hemolytic streptococci are predictably susceptible to penicillin and other beta lactams. Susceptibility testing not routinely performed. Performed at [facility]    Report Status 08/05/2015 FINAL  Final  Urine culture     Status: None   Collection Time: 08/02/15  4:27 PM  Result Value Ref Range Status   Specimen Description URINE, CATHETERIZED  Final   Special Requests Normal  Final   Culture NO GROWTH 1 DAY  Final   Report Status 08/03/2015 FINAL  Final     Studies: No results found.  Scheduled Meds: . sodium chloride   Intravenous Once  .  sodium chloride   Intravenous Once  . antiseptic oral rinse  7 mL Mouth Rinse BID  . aspirin  81 mg Oral Daily  . atorvastatin  80 mg Oral q1800  . carvedilol  3.125 mg Oral BID WC  . furosemide  40 mg Oral Daily  . pantoprazole  40 mg Oral Q1200  . potassium chloride  20 mEq Oral Daily  . sodium chloride flush  10-40 mL Intracatheter Q12H   Continuous Infusions: . sodium chloride 10 mL/hr at 08/06/15 0550  . 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 If 7PM-7AM, please contact night-coverage at [email or web address], password [facility] 08/08/2015, 2:23 PM  LOS: 6 days

## 2015-08-08 NOTE — Procedures (Signed)
Placement of IVC filter without complication.  No immediate complication and minimal blood loss.  See report in Imaging/PACS.

## 2015-08-09 ENCOUNTER — Other Ambulatory Visit (HOSPITAL_COMMUNITY): Payer: Self-pay | Admitting: Oncology

## 2015-08-09 DIAGNOSIS — C7910 Secondary malignant neoplasm of unspecified urinary organs: Secondary | ICD-10-CM

## 2015-08-09 DIAGNOSIS — R092 Respiratory arrest: Secondary | ICD-10-CM

## 2015-08-09 LAB — CBC
HEMATOCRIT: 26.3 % — AB (ref 39.0–52.0)
HEMOGLOBIN: 8.2 g/dL — AB (ref 13.0–17.0)
MCH: 31.9 pg (ref 26.0–34.0)
MCHC: 31.2 g/dL (ref 30.0–36.0)
MCV: 102.3 fL — AB (ref 78.0–100.0)
Platelets: 58 10*3/uL — ABNORMAL LOW (ref 150–400)
RBC: 2.57 MIL/uL — AB (ref 4.22–5.81)
RDW: 21.7 % — ABNORMAL HIGH (ref 11.5–15.5)
WBC: 6.7 10*3/uL (ref 4.0–10.5)

## 2015-08-09 LAB — TYPE AND SCREEN
ABO/RH(D): A NEG
Antibody Screen: NEGATIVE
Unit division: 0

## 2015-08-09 LAB — PREPARE RBC (CROSSMATCH)

## 2015-08-09 LAB — HEPARIN LEVEL (UNFRACTIONATED): Heparin Unfractionated: <0.1 IU/mL — ABNORMAL LOW (ref 0.30–0.70)

## 2015-08-09 MED ORDER — FUROSEMIDE 40 MG PO TABS: 40.0000 mg | ORAL_TABLET | Freq: Every day | ORAL | Status: DC

## 2015-08-09 MED ORDER — CARVEDILOL 3.125 MG PO TABS: 3.1250 mg | ORAL_TABLET | Freq: Two times a day (BID) | ORAL | Status: DC

## 2015-08-09 MED ORDER — PANTOPRAZOLE SODIUM 40 MG PO TBEC: 40.0000 mg | DELAYED_RELEASE_TABLET | Freq: Every day | ORAL | Status: DC

## 2015-08-09 MED ORDER — ASPIRIN 81 MG PO CHEW: 81.0000 mg | CHEWABLE_TABLET | Freq: Every day | ORAL | Status: DC

## 2015-08-09 MED ORDER — POTASSIUM CHLORIDE ER 20 MEQ PO TBCR: 20.0000 meq | EXTENDED_RELEASE_TABLET | Freq: Every day | ORAL | Status: DC

## 2015-08-09 MED ORDER — SODIUM CHLORIDE 0.9 % IV SOLN: Freq: Once | INTRAVENOUS | Status: DC

## 2015-08-09 MED ORDER — HEPARIN SOD (PORK) LOCK FLUSH 100 UNIT/ML IV SOLN
500.0000 [IU] | INTRAVENOUS | Status: AC | PRN
  Administered 2015-08-09: 500 [IU]

## 2015-08-09 NOTE — Care Management Important Message (Signed)
Important Message  Patient Details  Name: Harry Franklin MRN: [id number] Date of Birth: August 24, 1940   Medicare Important Message Given:  Yes    [name] Wanna Gully 08/09/2015, 3:17 PM

## 2015-08-09 NOTE — Progress Notes (Signed)
Physical Therapy Note Patient Details Name: Harry Franklin MRN: [id number] DOB: [birth year] Today's Date: 08/09/2015    Patient receiving two units of blood prior to d/c home today.  Reports up mobilizing today without device.  Feel may benefit from follow up [place] (patient reports was ordered after previous d/c and hasn't started due to readmission.)  Will cancel for today due to home after blood transfusion.      Follow Up Recommendations  Home health PT                        PT Plan Discharge plan needs to be updated            Reginia Naas 08/09/2015, 1:47 PM  Magda Kiel, [place] 08/09/2015

## 2015-08-09 NOTE — Discharge Summary (Signed)
Physician Discharge Summary  Harry Franklin [id number] DOB: July 20, 1940 DOA: 08/02/2015  PCP: [name], MD  Admit date: 08/02/2015 Discharge date: 08/09/2015  Time spent: 20 minutes  Recommendations for Outpatient Follow-up:  1. Follow up with PCP in 2-3 weeks 2. Follow up with Cardiology as scheduled 3. Follow up with Oncology as scheduled   Discharge Diagnoses:  Active Problems:   Atrial fibrillation with rapid ventricular response (HCC)   Acute pulmonary edema (HCC)   Cardiac arrest (HCC)   Respiratory arrest (HCC)   AKI (acute kidney injury) ([place])   Thrombocytopenia (HCC)   Absolute anemia   Acute hypoxemic respiratory failure ([place])   Malignant neoplasm of urinary bladder [facility])   Discharge Condition: Stable  Diet recommendation: heart healthy  Filed Weights   08/07/15 0433 08/08/15 0632 08/09/15 0537  Weight: 121.9 kg (268 lb 11.9 oz) 121.473 kg (267 lb 12.8 oz) 121.383 kg (267 lb 9.6 oz)    History of present illness:  Please review dictated H and P from 3/19 for details. Briefly, 75 yo male with respiratory leading to cardiac arrest. Found to have acute b/l DVT and presumed to have PE. He has hx of DVT and was not on anticoagulation due pancytopenia. 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 He also has hx of CAD, systolic CHF, A fib.  Hospital Course:   Bilateral DVT, likely PE  B LE DVT noted on 3/13 dopplers, with presumed PE  Currently remains on heparin gtt  Plts remain low, slightly higher to 52k this AM  Had initially discussed case with pt's Oncologist on 3/16 who strongly agreed with starting coumadin.  Hgb slowly trended down to the 7 range thus discussed case with primary Oncologist, Dr. Whitney Muse on 3/18, who recommended stopping coumadin and placing IVC filter until blood counts improve with ultimate plans to transition to coumadin at that time  IVC filter placed 3/19  Discussed with  Oncology, recs for 2 more units of PRBC's on 3/20. Afterwards, pt cleared for discharge  Acute blood loss anemia  Patient transfusion dependent this admission  To follow up closely with Hematology/Oncology  Cardiac arrest  Pt s/p PEA arrest suspected from acute PE  Cardiology had been following  Initially started coumadin. Anticoagulation now stopped, per above  CAD  Not good candidate for intervention   Cont medical management per Cardiology  Paroxysmal Afib  CHADS-VASc of 2  Per Cardiology, Afib alone not indication for anticoagulation, however given acute DVT and PE, would ultimately benefit from coumadin down the road per above  Acute on chronic systolic CHF  Recs for lasix on discharge  Hypoxic respiratory failure  Resolved  Stable at present  Stage 4 urethral cancer  Followed by Dr. Whitney Muse  Next appt on 3/22, scheduled for outpatient CT on 3/21  CKD2  Stable  Metabolic encephalopathy  Seems to be at baseline mental status  Procedures:  IVC filter placement 3/19  Consultations:  Pulmonary  Cardiology  Oncology over phone  IR  Discharge Exam: Filed Vitals:   08/09/15 0537 08/09/15 1104 08/09/15 1232 08/09/15 1304  BP: 113/61 117/59 124/65 104/68  Pulse: 79 84 84 84  Temp: 97.6 F (36.4 C) 98.2 F (36.8 C) 96.4 F (35.8 C) 98.3 F (36.8 C)  TempSrc: Oral Oral Axillary Oral  Resp: 18 18 18 18   Height:      Weight: 121.383 kg (267 lb 9.6 oz)     SpO2: 97% 97% 97% 98%  General: Awake, in nad Cardiovascular: regular, s1, s2 Respiratory: normal resp effort, no wheezing  Discharge Instructions     Medication List    STOP taking these medications        aspirin 325 MG tablet  Replaced by:  aspirin 81 MG chewable tablet     lisinopril 2.5 MG tablet  Commonly known as:  PRINIVIL,ZESTRIL     omeprazole 40 MG capsule  Commonly known as:  PRILOSEC     predniSONE 20 MG tablet  Commonly known as:  DELTASONE      TAKE  these medications        acetaminophen 325 MG tablet  Commonly known as:  TYLENOL  Take 650 mg by mouth every 6 (six) hours as needed for mild pain or fever.     aspirin 81 MG chewable tablet  Chew 1 tablet (81 mg total) by mouth daily.     atorvastatin 80 MG tablet  Commonly known as:  LIPITOR  Take 1 tablet (80 mg total) by mouth daily at 6 PM.     CALCIUM 500 + D3 500-600 MG-UNIT Tabs  Generic drug:  Calcium Carb-Cholecalciferol  Take 2 tablets by mouth daily.     carvedilol 3.125 MG tablet  Commonly known as:  COREG  Take 1 tablet (3.125 mg total) by mouth 2 (two) times daily with a meal.     feeding supplement Liqd  Take 1 Container by mouth daily.     furosemide 40 MG tablet  Commonly known as:  LASIX  Take 1 tablet (40 mg total) by mouth daily.     hydrocortisone cream 1 %  Apply 1 application topically 2 (two) times daily as needed for itching (skin irritation on chest).     ondansetron 8 MG tablet  Commonly known as:  ZOFRAN  Take 1 tablet (8 mg total) by mouth every 8 (eight) hours as needed for nausea or vomiting.     pantoprazole 40 MG tablet  Commonly known as:  PROTONIX  Take 1 tablet (40 mg total) by mouth daily at 12 noon.     Potassium Chloride ER 20 MEQ Tbcr  Take 20 mEq by mouth daily.     PRESERVISION AREDS 2 PO  Take 1 tablet by mouth 2 (two) times daily.       Allergies  Allergen Reactions  . Neulasta [Pegfilgrastim] Hives    Pt reports breaking out in hives less than 24 hours after taking neulasta. Hives appear on arms and legs.  . Rabeprazole     Other reaction(s): DIARRHEA  . Vancomycin Hives and Rash   Follow-up Information    Follow up with Carlyle Dolly, MD. Go on 08/12/2015.   Specialty:  Cardiology   Why:  @8 :20   Contact information:   [address] [place] [address] [phone]       Follow up with BOUSKA,DAVID E, MD. Schedule an appointment as soon as possible for a visit in 2 weeks.   Specialty:  Family  Medicine   Why:  Hospital follow up   Contact information:   [place] [address] [phone]       Follow up with Molli Hazard, MD.   Specialties:  Hematology and Oncology, Oncology   Why:  for CT scan on 3/21 and for office visit on 3/22 as scheduled   Contact information:   [place] [address] [phone]  The results of significant diagnostics from this hospitalization (including imaging, microbiology, ancillary and laboratory) are listed below for reference.    Significant Diagnostic Studies: Ct Franklin Wo Contrast  08/02/2015  CLINICAL DATA:  Altered mental status/ cardiac arrest EXAM: CT Franklin WITHOUT CONTRAST TECHNIQUE: Contiguous axial images were obtained from the base of the skull through the vertex without intravenous contrast. COMPARISON:  None. FINDINGS: There is age related volume loss. There is no intracranial mass, hemorrhage, extra-axial fluid collection, or midline shift. There is slight small vessel disease in the centra semiovale bilaterally. There is preservation of the gray -white differentiation. There is subtle decreased attenuation in the mid to lower pons on the left. No other evidence of potential acute infarct. Bony calvarium appears intact. Mastoid air cells are clear. There is mucosal thickening in several ethmoid air cells bilaterally. No intraorbital lesions are identified. IMPRESSION: No brain edema is evident. No hemorrhage. There is subtle decreased attenuation in the medial left mid to lower pons. This finding may be artifactual. However, a subtle early infarct in the left pons must be of concern. No other findings suggesting potential acute infarct evident on this study. There is slight small vessel disease in the centra semiovale bilaterally. There are areas of ethmoid sinus disease bilaterally. Electronically Signed   By: Lowella Grip III M.D.   On: 08/02/2015 17:26   Ct Chest Wo  Contrast  08/02/2015  CLINICAL DATA:  Follow-up after cardiac arrest. History of hypertension. EXAM: CT CHEST WITHOUT CONTRAST TECHNIQUE: Multidetector CT imaging of the chest was performed following the standard protocol without IV contrast. COMPARISON:  Chest CT dated 06/08/2015. FINDINGS: Mediastinum/Lymph Nodes: Endotracheal tube well positioned with tip above the level of the carina. Enteric tube passes into the stomach. Right subclavian central line appears well positioned. Mild cardiomegaly is unchanged. Trace pericardial effusion again noted at the heart base. Diffuse coronary artery calcifications again noted. Scattered atherosclerotic changes are seen along the walls of the normal-caliber thoracic aorta. No mass or enlarged lymph nodes seen within the mediastinum. Lungs/Pleura: Bilateral pleural effusions, moderate to large in size, increased compared to the previous study. Additional patchy consolidations throughout both lungs, most likely atelectasis. Mild emphysematous change noted at each lung apex. Upper abdomen: Enteric tube in the stomach. Limited images of the upper abdomen are otherwise unremarkable. Musculoskeletal: No chest wall mass or suspicious bone lesions identified. Degenerative changes throughout the thoracic spine, moderate in degree. IMPRESSION: 1. Bilateral pleural effusions, moderate to large in size, increased compared to the previous study. Additional patchy consolidations scattered within both lungs, most likely atelectasis, less likely pneumonia or aspiration. 2. Cardiomegaly, stable. Diffuse coronary artery calcifications again noted. Trace pericardial effusion at the heart base appears stable. 3. Tubes and lines appear adequately positioned, as detailed above. Electronically Signed   By: Franki Cabot M.D.   On: 08/02/2015 17:35   Ir Ivc Filter Plmt / S&i /img Guid/mod Sed  08/08/2015  INDICATION: 75 year old with bilateral lower extremity DVTs. The patient has been  anticoagulated but the hemoglobin has been decreasing and the patient is thrombocytopenic. Retrievable IVC filter has been requested in order to take the patient off anticoagulation and see if the anemia and thrombocytopenia improve. EXAM: IVC FILTER PLACEMENT; IVC VENOGRAM; ULTRASOUND FOR VASCULAR ACCESS Physician: Stephan Minister. Anselm Pancoast, MD MEDICATIONS: None. ANESTHESIA/SEDATION: Fentanyl 50 mcg IV; Versed 2.0 mg IV Moderate Sedation Time:  14 minutes The patient was continuously monitored during the procedure by the interventional radiology nurse under my direct supervision. CONTRAST:  50 mL Omnipaque 300 FLUOROSCOPY TIME:  Fluoroscopy Time: 1 minutes 42 seconds (71 mGy). COMPLICATIONS: None immediate. PROCEDURE: Informed consent was obtained for an IVC venogram and filter placement. Ultrasound demonstrated a patent right common femoral vein. Ultrasound images were obtained for documentation. The right groin was prepped and draped in a sterile fashion. Maximal barrier sterile technique was utilized including caps, mask, sterile gowns, sterile gloves, sterile drape, hand hygiene and skin antiseptic. The skin was anesthetized with 1% lidocaine. A 21 gauge needle was directed into the vein with ultrasound guidance and a micropuncture dilator set was placed. A wire was advanced into the IVC. The filter sheath was advanced over the wire into the IVC. An IVC venogram was performed. Fluoroscopic images were obtained for documentation. A Bard Denali filter was deployed below the lowest renal vein. A follow-up venogram was performed and the vascular sheath was removed with manual compression. FINDINGS: IVC was patent. Bilateral renal veins were identified. Infrarenal IVC measured approximately 26 mm. The filter was deployed below the lowest renal vein. Follow-up venogram confirmed placement within the IVC and below the renal veins. IMPRESSION: Successful placement of a retrievable IVC filter. This IVC filter is potentially  retrievable. The patient will be assessed for filter retrieval by Interventional Radiology in approximately 8-12 weeks. Further recommendations regarding filter retrieval, continued surveillance or declaration of device permanence, will be made at that time. Electronically Signed   By: Markus Daft M.D.   On: 08/08/2015 20:10   Dg Chest Port 1 View  08/04/2015  CLINICAL DATA:  Respiratory failure/ shortness of breath EXAM: PORTABLE CHEST 1 VIEW COMPARISON:  August 03, 2015 FINDINGS: Endotracheal tube and nasogastric tube have been removed. Port-A-Cath tip is in the superior vena cava. No pneumothorax. There is atelectatic change in the medial left base. The lungs elsewhere clear. Heart remains mildly enlarged with pulmonary vascularity within normal limits. No adenopathy. There is degenerative change in each shoulder. IMPRESSION: No pneumothorax. No frank edema or consolidation. Stable cardiac prominence. Electronically Signed   By: Lowella Grip III M.D.   On: 08/04/2015 07:24   Dg Chest Port 1 View  08/03/2015  CLINICAL DATA:  Acute respiratory failure. EXAM: PORTABLE CHEST 1 VIEW COMPARISON:  08/02/2015 FINDINGS: Endotracheal tube remains with the tip approximately 2 cm above the carina. Gastric decompression tube extends below the diaphragm. Port-A-Cath shows stable positioning. Temporary pacing pads present. Lungs show improved aeration bilaterally and decrease in pulmonary edema with suggestion of some residual mild interstitial edema/ CHF and potential bilateral pleural effusions. Atelectasis versus consolidation noted in both lower lobes. IMPRESSION: Decreasing pulmonary edema with improved bilateral aeration. Suggestion of mild remaining interstitial edema and potential bilateral pleural effusions. Electronically Signed   By: Aletta Edouard M.D.   On: 08/03/2015 08:24   Dg Chest Portable 1 View  08/02/2015  CLINICAL DATA:  75 year old male with shortness breath and endotracheal tube placement.  Subsequent encounter. EXAM: PORTABLE CHEST 1 VIEW COMPARISON:  07/29/2015 chest x-ray and 06/08/2015 chest CT. FINDINGS: Endotracheal tube tip 3.2 cm above the carina. Right central line tip seen to level of the distal superior vena cava. Nasogastric tube courses below the diaphragm. Tip is not included on the present exam. Diffuse asymmetric airspace disease greater on right may represent pulmonary edema with pleural effusions limiting evaluation for lower lobe infiltrate or mass. No gross pneumothorax. Cardiomegaly. IMPRESSION: Endotracheal tube tip 3.2 cm above the carina. Right central line tip seen to level of the distal superior vena cava. Nasogastric tube  courses below the diaphragm. Tip is not included on the present exam. Diffuse asymmetric airspace disease greater on right may represent pulmonary edema with pleural effusions limiting evaluation for lower lobe infiltrate or mass. Cardiomegaly. Electronically Signed   By: Genia Del M.D.   On: 08/02/2015 11:05   Dg Chest Port 1 View  07/29/2015  CLINICAL DATA:  Cancer patient, metastatic urothelial carcinoma, shortness of breath, itching all over, with received 2 units of blood transfusion yesterday and Neulasta injection yesterday, history hypertension, former smoker EXAM: PORTABLE CHEST 1 VIEW COMPARISON:  CT chest 06/08/2015 ; chest radiograph 06/30/2013 FINDINGS: RIGHT jugular Port-A-Cath stable tip projecting over SVC. Mild enlargement of cardiac silhouette. Mediastinal contours and pulmonary vascularity normal. COPD changes with minimal bibasilar opacities unchanged since 2015. No definite acute infiltrate or pleural effusion. Bullous change versus less likely small loculated pneumothorax RIGHT apex unchanged. No LEFT pneumothorax identified. Osseous structures unremarkable. 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 RIGHT apex scarring at question bullous disease versus less likely small loculated pneumothorax  unchanged since 06/08/2015 CT. No acute abnormalities. Electronically Signed   By: Lavonia Dana M.D.   On: 07/29/2015 10:33    Microbiology: Recent Results (from the past 240 hour(s))  Blood culture (routine x 2)     Status: None   Collection Time: 08/02/15 12:00 PM  Result Value Ref Range Status   Specimen Description BLOOD PORTA CATH DRAWN BY RN  Final   Special Requests BOTTLES DRAWN AEROBIC AND ANAEROBIC 10 CC EACH  Final   Culture NO GROWTH 5 DAYS  Final   Report Status 08/07/2015 FINAL  Final  Blood culture (routine x 2)     Status: None   Collection Time: 08/02/15 12:15 PM  Result Value Ref Range Status   Specimen Description BLOOD LEFT HAND  Final   Special Requests BOTTLES DRAWN AEROBIC ONLY 8 CC  Final   Culture NO GROWTH 5 DAYS  Final   Report Status 08/07/2015 FINAL  Final  MRSA PCR Screening     Status: None   Collection Time: 08/02/15  3:37 PM  Result Value Ref Range Status   MRSA by PCR NEGATIVE NEGATIVE Final    Comment:        The GeneXpert MRSA Assay (FDA approved for NASAL specimens only), is one component of a comprehensive MRSA colonization surveillance program. It is not intended to diagnose MRSA infection nor to guide or monitor treatment for MRSA infections.   Culture, respiratory (tracheal aspirate)     Status: None   Collection Time: 08/02/15  4:00 PM  Result Value Ref Range Status   Specimen Description TRACHEAL ASPIRATE  Final   Special Requests Normal  Final   Gram Stain   Final    ABUNDANT WBC PRESENT,BOTH PMN AND MONONUCLEAR NO SQUAMOUS EPITHELIAL CELLS SEEN NO ORGANISMS SEEN Performed at [facility]    Culture   Final    RARE STREPTOCOCCUS GROUP G Note: Beta hemolytic streptococci are predictably susceptible to penicillin and other beta lactams. Susceptibility testing not routinely performed. Performed at [facility]    Report Status 08/05/2015 FINAL  Final  Urine culture     Status: None   Collection Time: 08/02/15   4:27 PM  Result Value Ref Range Status   Specimen Description URINE, CATHETERIZED  Final   Special Requests Normal  Final   Culture NO GROWTH 1 DAY  Final   Report Status 08/03/2015 FINAL  Final     Labs:  Basic Metabolic Panel:  Recent Labs Lab 08/02/15 2211 08/03/15 0318 08/04/15 0417 08/05/15 0410 08/06/15 0419  NA  --  139 140 139 139  K  --  3.8 3.7 3.8 3.6  CL  --  104 101 101 102  CO2  --  27 28 27 28   GLUCOSE  --  83 85 92 89  BUN  --  27* 26* 23* 22*  CREATININE  --  1.34* 1.62* 1.42* 1.29*  CALCIUM  --  8.2* 7.9* 7.7* 7.7*  MG 1.2* 1.2*  --   --   --   PHOS 4.0 4.0  --   --   --    Liver Function Tests: No results for input(s): AST, ALT, ALKPHOS, BILITOT, PROT, ALBUMIN in the last 168 hours. No results for input(s): LIPASE, AMYLASE in the last 168 hours. No results for input(s): AMMONIA in the last 168 hours. CBC:  Recent Labs Lab 08/05/15 0410 08/06/15 0419  08/07/15 0432  08/07/15 2050 08/08/15 0540 08/08/15 1000 08/08/15 1704 08/09/15 0402  WBC 11.9* 9.8  --  9.1  --   --  6.9  --   --  6.7  HGB 9.6* 8.0*  < > 7.7*  < > 9.1* 7.6* 7.8* 7.4* 8.2*  HCT 31.0* 25.3*  < > 24.4*  < > 27.7* 23.9* 24.4* 23.7* 26.3*  MCV 101.6* 100.8*  --  101.7*  --   --  101.7*  --   --  102.3*  PLT 35* 38*  --  45*  --   --  52*  --   --  58*  < > = values in this interval not displayed. Cardiac Enzymes:  Recent Labs Lab 08/02/15 2211 08/03/15 0318 08/05/15 1620  TROPONINI 0.69* 1.29* 0.50*   BNP: BNP (last 3 results)  Recent Labs  06/08/15 0830 08/02/15 1039  BNP 657.0* 721.0*    ProBNP (last 3 results) No results for input(s): PROBNP in the last 8760 hours.  CBG:  Recent Labs Lab 08/03/15 0823 08/03/15 0926 08/03/15 1149 08/03/15 1730 08/05/15 1116  GLUCAP 65 81 68 94 97    Signed:  CHIU, STEPHEN K  [facility] 08/09/2015, 2:24 PM

## 2015-08-09 NOTE — Care Management Note (Signed)
Case Management Note Previous CM note initiated by Donne Anon RN, CM  Patient Details  Name: Harry Franklin MRN: [id number] Date of Birth: [birth year]  Subjective/Objective:      Adm w arrest, vent  Action/Plan:lives w fam, pcp dr Coletta Memos   Expected Discharge Date:   08/09/15               Expected Discharge Plan:  [place]  In-House Referral:     Discharge planning Services  CM Consult  Post Acute Care Choice:  Home Health, Resumption of Svcs/PTA Provider Choice offered to:  Patient  DME Arranged:    DME Agency:     HH Arranged:  PT [place]:  [place]  Status of Service:  Completed, signed off  Medicare Important Message Given:  Yes Date Medicare IM Given:    Medicare IM give by:    Date Additional Medicare IM Given:    Additional Medicare Important Message give by:     If discussed at [place] of Stay Meetings, dates discussed:    Additional Comments: ur review done  08/09/15- 1600- Marvetta Gibbons RN, BSN- pt for d/c home today- order written for HHPT- call made to pt in room via TC- per conversation with pt he states that he was active with [facility] for HHPT prior to admission- he would like to resume services- call made to Manuela Schwartz with [facility] who confirmed that pt was active with them- order to resume has been placed- pt to d/c home today- no further CM needs noted  Dawayne Patricia, RN 08/09/2015, 4:36 PM

## 2015-08-10 ENCOUNTER — Ambulatory Visit (HOSPITAL_COMMUNITY)
Admit: 2015-08-10 | Discharge: 2015-08-10 | Disposition: A | Discharge status: Home / Self Care | Payer: Medicare HMO | Attending: Hematology & Oncology | Admitting: Hematology & Oncology

## 2015-08-10 DIAGNOSIS — J439 Emphysema, unspecified: Secondary | ICD-10-CM | POA: Insufficient documentation

## 2015-08-10 DIAGNOSIS — C7910 Secondary malignant neoplasm of unspecified urinary organs: Secondary | ICD-10-CM | POA: Insufficient documentation

## 2015-08-10 DIAGNOSIS — I2510 Atherosclerotic heart disease of native coronary artery without angina pectoris: Secondary | ICD-10-CM | POA: Insufficient documentation

## 2015-08-10 DIAGNOSIS — C801 Malignant (primary) neoplasm, unspecified: Secondary | ICD-10-CM | POA: Diagnosis present

## 2015-08-10 DIAGNOSIS — K449 Diaphragmatic hernia without obstruction or gangrene: Secondary | ICD-10-CM | POA: Diagnosis not present

## 2015-08-10 DIAGNOSIS — N400 Enlarged prostate without lower urinary tract symptoms: Secondary | ICD-10-CM | POA: Diagnosis not present

## 2015-08-10 DIAGNOSIS — C7972 Secondary malignant neoplasm of left adrenal gland: Secondary | ICD-10-CM | POA: Diagnosis not present

## 2015-08-10 DIAGNOSIS — M899 Disorder of bone, unspecified: Secondary | ICD-10-CM | POA: Insufficient documentation

## 2015-08-10 DIAGNOSIS — Z923 Personal history of irradiation: Secondary | ICD-10-CM | POA: Insufficient documentation

## 2015-08-10 DIAGNOSIS — J90 Pleural effusion, not elsewhere classified: Secondary | ICD-10-CM | POA: Insufficient documentation

## 2015-08-10 DIAGNOSIS — R918 Other nonspecific abnormal finding of lung field: Secondary | ICD-10-CM | POA: Diagnosis not present

## 2015-08-10 DIAGNOSIS — K5730 Diverticulosis of large intestine without perforation or abscess without bleeding: Secondary | ICD-10-CM | POA: Insufficient documentation

## 2015-08-10 DIAGNOSIS — C787 Secondary malignant neoplasm of liver and intrahepatic bile duct: Secondary | ICD-10-CM | POA: Diagnosis not present

## 2015-08-10 LAB — TYPE AND SCREEN
ABO/RH(D): A NEG
Antibody Screen: NEGATIVE
UNIT DIVISION: 0
Unit division: 0

## 2015-08-10 MED ORDER — IOHEXOL 300 MG/ML  SOLN
100.0000 mL | Freq: Once | INTRAMUSCULAR | Status: AC | PRN
  Administered 2015-08-10: 100 mL via INTRAVENOUS

## 2015-08-11 ENCOUNTER — Encounter (HOSPITAL_COMMUNITY): Payer: Medicare HMO

## 2015-08-11 ENCOUNTER — Encounter (HOSPITAL_COMMUNITY): Payer: Self-pay | Admitting: Hematology & Oncology

## 2015-08-11 ENCOUNTER — Encounter (HOSPITAL_BASED_OUTPATIENT_CLINIC_OR_DEPARTMENT_OTHER): Payer: Medicare HMO | Admitting: Hematology & Oncology

## 2015-08-11 VITALS — BP 126/59 | HR 86 | Temp 98.2°F | Resp 18 | Wt 269.8 lb

## 2015-08-11 DIAGNOSIS — C7951 Secondary malignant neoplasm of bone: Secondary | ICD-10-CM

## 2015-08-11 DIAGNOSIS — Z86718 Personal history of other venous thrombosis and embolism: Secondary | ICD-10-CM

## 2015-08-11 DIAGNOSIS — C7972 Secondary malignant neoplasm of left adrenal gland: Secondary | ICD-10-CM

## 2015-08-11 DIAGNOSIS — C7971 Secondary malignant neoplasm of right adrenal gland: Secondary | ICD-10-CM | POA: Diagnosis not present

## 2015-08-11 DIAGNOSIS — I4891 Unspecified atrial fibrillation: Secondary | ICD-10-CM

## 2015-08-11 DIAGNOSIS — D6481 Anemia due to antineoplastic chemotherapy: Secondary | ICD-10-CM

## 2015-08-11 DIAGNOSIS — I82409 Acute embolism and thrombosis of unspecified deep veins of unspecified lower extremity: Secondary | ICD-10-CM

## 2015-08-11 DIAGNOSIS — C787 Secondary malignant neoplasm of liver and intrahepatic bile duct: Secondary | ICD-10-CM

## 2015-08-11 DIAGNOSIS — C649 Malignant neoplasm of unspecified kidney, except renal pelvis: Secondary | ICD-10-CM | POA: Diagnosis not present

## 2015-08-11 DIAGNOSIS — C7910 Secondary malignant neoplasm of unspecified urinary organs: Secondary | ICD-10-CM

## 2015-08-11 NOTE — Progress Notes (Signed)
Per Dr.Penland, no chemo and no Xgeva today. No lab work needed. Reschedule lab prior to xgeva with next office visit.

## 2015-08-11 NOTE — Progress Notes (Signed)
No chemo today.

## 2015-08-11 NOTE — Progress Notes (Signed)
[name], MD [place] [address]  Metastatic urothelial carcinoma [facility]) - Plan: CBC with Differential, Comprehensive metabolic panel  CURRENT THERAPY: Carboplatin/Gemcitabine in a day 1, 8 every 21 day fashion  INTERVAL HISTORY: Harry Franklin 75 y.o. 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    Metastatic urothelial carcinoma ([place])   04/02/2015 Imaging CT abd/pelvis- Innumberable hepatic masses, B/L adrenal mets, abdominopelvic lymphadenopathy, LLL lung lesion 6 mm, Lytic lesion of right L2 vertebral bosy, circumferential wall thickening of lower thoracic esophagus, mild diffuse bladder wall thickening.   04/02/2015 Pathology Results Urine Cytology- cells present suspicious for malignancy.  Suspicious for high-grade urothelial carcinoma.   04/08/2015 Procedure US guided biopsy of hepatic lesion   04/08/2015 Pathology Results Liver, needle/core biopsy, right - METASTATIC HIGH GRADE CARCINOMA,   04/14/2015 - 07/21/2015 Chemotherapy Carboplatin/Gemcitabine Days 1, 8 every 21 days   05/25/2015 Imaging CT with interval response to therapy, improvement in multifocal liver mets, improvement in enlarged abdominal and pelvic adenopahty and bilateral adrenal gland metastatese, stable appearance of lytic lesion in L2   06/02/2015 Miscellaneous Xgeva to reduce the risk of SLE.  120 mg SQ every month.   06/08/2015 - 06/11/2015 Hospital Admission NSTEMI, Afib with RVR, Hypertension, Chemotherapy induced pancyopenia   06/28/2015 Treatment Plan Change Carboplatin dose reduced to AUC of 4 and Gemcitabine dose reduced by 20%   08/02/2015 - 08/09/2015 Hospital Admission Respiratory Arrest, acute bilateral DVT, presumed PE. IVC filter placed secondary to progressive anemia on anticoagulation   08/10/2015 Imaging CT CAP- Significant interval treatment response.    Mr.  Harry Franklin was here today with two family members.  He was recently admitted after a respiratory arrest for DVT/PE. Currently has an IVC filter secondary to progressive anemia on anticoagulation.   He feels good now. "Best I've felt" his sister said that he lost 10 pounds in the hospital.   He has a blood clot in his right leg.  He asked if this is due to his cancer. His sister also asked about what happens to blood clots.   He denies chest pain. Remarks that his appetite is good. He denies significant SOB. Denies CP. Denies abdominal pain.  Past Medical History  Diagnosis Date  . GERD (gastroesophageal reflux disease)   . Hypokalemia   . Hypertension   . Cataract   . Arthritis   . Macular degeneration, age related   . DVT (deep venous thrombosis) ([place])     developed after traveling x 1, developed another after left foot surgery  . Hydrocele in adult   . 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 Bone metastases (Harry Franklin) 09/21/2015    has Dysphagia; Encounter for screening colonoscopy; Dysphagia, pharyngoesophageal phase; Reflux esophagitis; Peptic stricture of esophagus; Hiatal hernia; Abdominal pain; Metastatic urothelial carcinoma (Harry Franklin); Dyspnea; Elevated troponin; Atrial fibrillation with rapid ventricular response ([place]); Antineoplastic chemotherapy induced pancytopenia [facility]); NSTEMI (non-ST elevated myocardial infarction) ([place]); Recurrent falls; Acute pulmonary edema ([place]); Cardiac arrest ([place]); Respiratory arrest ([place]); AKI (acute kidney injury) ([place]); Thrombocytopenia ([place]); Absolute anemia; Acute hypoxemic respiratory failure ([place]); Malignant neoplasm of urinary bladder ([place]); Acute pulmonary embolism ([place]); and Bone metastases ([place]) on his problem list.     is allergic to neulasta; rabeprazole; and vancomycin.  Current Outpatient Prescriptions on File Prior to Visit  Medication Sig Dispense Refill  . acetaminophen (TYLENOL) 325 MG tablet Take 650 mg by  mouth every 6 (six)  hours as needed for mild pain or fever. Reported on 08/12/2015    . atorvastatin (LIPITOR) 80 MG tablet Take 1 tablet (80 mg total) by mouth daily at 6 PM. 90 tablet 3  . Calcium Carb-Cholecalciferol (CALCIUM 500 + D3) 500-600 MG-UNIT TABS Take 2 tablets by mouth daily.    . furosemide (LASIX) 40 MG tablet Take 1 tablet (40 mg total) by mouth daily. 30 tablet 0  . Multiple Vitamins-Minerals (PRESERVISION AREDS 2 PO) Take 1 tablet by mouth 2 (two) times daily.    . potassium chloride 20 MEQ TBCR Take 20 mEq by mouth daily. 30 tablet 0   No current facility-administered medications on file prior to visit.    Past Surgical History  Procedure Laterality Date  . Replacement total knee    . Appendectomy    . Hammer toe surgery Left   . Lapidus procedure    . Hallux fusion    . Colonoscopy  03/31/05  . Colonoscopy N/A 10/02/2014    [id number] diverticulosis  . Esophagogastroduodenoscopy N/A 10/02/2014    [id number] ulcerative reflux/s/p dilation/large HH  . Maloney dilation N/A 10/02/2014    Procedure: Venia Minks DILATION;  Surgeon: Daneil Dolin, MD;  [facility];  Service: Endoscopy;  Laterality: N/A;  . Esophagogastroduodenoscopy N/A 01/29/2015    Procedure: ESOPHAGOGASTRODUODENOSCOPY (EGD);  Surgeon: Daneil Dolin, MD;  Location: [facility];  Service: Endoscopy;  Laterality: N/A;  0800-rescheduled to 9/9 @ 915 [place] notified pt  . Esophageal dilation  01/29/2015    Procedure: ESOPHAGEAL DILATION;  Surgeon: Daneil Dolin, MD;  Location: [facility];  Service: Endoscopy;;  . Harry Franklin placement Left 04/13/15    REVIEW OF SYSTEMS Positive for shortness of breath. Denies any headaches, dizziness, double vision, fevers, chills, abdominal pain, night sweats, nausea, vomiting, diarrhea, constipation, chest pain, heart palpitations, shortness of breath, blood in stool, black tarry stool, urinary pain, urinary burning, urinary frequency, hematuria.  14 point review of systems was  performed and is negative except as detailed under history of present illness and above  PHYSICAL EXAMINATION  ECOG PERFORMANCE STATUS: 0 - Asymptomatic   Vitals with BMI 08/11/2015  Height   Weight 269 lbs 13 oz  BMI   Systolic [id number]  Diastolic 59  Pulse 86  Respirations 18   GENERAL:alert, no distress, well nourished, well developed, comfortable, cooperative, smiling and accompanied by family. SKIN: skin color, texture, turgor are normal, no rashes or significant lesions HEAD: Normocephalic, No masses, lesions, tenderness or abnormalities EYES: normal, EOMI, Conjunctiva are pink and non-injected EARS: External ears normal OROPHARYNX:lips, buccal mucosa, and tongue normal and mucous membranes are moist  NECK: supple, no stridor, trachea midline LYMPH:  no palpable lymphadenopathy, no hepatosplenomegaly BREAST:not examined LUNGS: clear to auscultation and percussion HEART: regular rate & rhythm ABDOMEN:abdomen soft, non-tender, normal bowel sounds and no masses or organomegaly BACK: Back symmetric, no curvature. EXTREMITIES:less then 2 second capillary refill, no edema, no skin discoloration, no clubbing, no cyanosis. Right leg larger than left, chronic from prior right knee surgery. NEURO: alert & oriented x 3 with fluent speech, no focal motor/sensory deficits, gait normal   LABORATORY DATA: I have reviewed the data as listed Results for SAMBATH, KARM (MRN [id number]) as of 08/11/2015 09:26  Ref. Range 08/09/2015 04:02  WBC Latest Ref Range: 4.0-10.5 K/uL 6.7  RBC Latest Ref Range: 4.22-5.81 MIL/uL 2.57 (L)  Hemoglobin Latest Ref Range: 13.0-17.0 g/dL 8.2 (L)  HCT Latest Ref  Range: 39.0-52.0 % 26.3 (L)  MCV Latest Ref Range: 78.0-100.0 fL 102.3 (H)  MCH Latest Ref Range: 26.0-34.0 pg 31.9  MCHC Latest Ref Range: 30.0-36.0 g/dL 31.2  RDW Latest Ref Range: 11.5-15.5 % 21.7 (H)  Platelets Latest Ref Range: 150-400 K/uL 58 (L)  Heparin Unfractionated Latest Ref Range:  0.30-0.70 IU/mL <0.10 (L)     RADIOGRAPHIC STUDIES: I have personally reviewed the radiological images as listed and agreed with the findings in the report.  Study Result     CLINICAL DATA: Restaging stage IV urothelial carcinoma diagnosed November 2016 status post chemotherapy. Prior appendectomy.  EXAM: CT CHEST, ABDOMEN, AND PELVIS WITH CONTRAST  TECHNIQUE: Multidetector CT imaging of the chest, abdomen and pelvis was performed following the standard protocol during bolus administration of intravenous contrast.  CONTRAST: 166mL OMNIPAQUE IOHEXOL 300 MG/ML SOLN  COMPARISON: 08/02/2015 chest CT. 05/25/2015 CT chest, abdomen and pelvis.  FINDINGS: CT CHEST  Mediastinum/Nodes: Normal heart size. Trace pericardial fluid/ thickening, unchanged. Extensive coronary atherosclerosis. Right internal jugular MediPort terminates at the cavoatrial junction. Great vessels are normal in course and caliber. No central pulmonary emboli. Normal visualized thyroid. Normal esophagus. No pathologically enlarged axillary, mediastinal or hilar lymph nodes.  Lungs/Pleura: No pneumothorax. Trace layering bilateral pleural effusions, decreased bilaterally since 08/02/2015. Mild paraseptal and centrilobular emphysema in the upper lobes with diffuse bronchial wall thickening. Right middle lobe 5 mm pulmonary nodule (series 7/ image 40) is stable since 05/25/2015. Basilar left lower lobe 6 mm pulmonary nodule (series 7/ image 55) is stable since 04/02/2015. Patchy subpleural reticulation and banding at the lung apices is unchanged, in keeping with nonspecific pleural-parenchymal scarring. Patchy opacity seen in both lungs on the 08/02/2015 chest CT have largely resolved. No acute consolidative airspace disease or new significant pulmonary nodules.  Musculoskeletal: No aggressive appearing focal osseous lesions. Moderate degenerative changes in the thoracic spine. Mild anasarca in  the ventral chest wall.  CT ABDOMEN AND PELVIS  Hepatobiliary: There is a 1.9 x 1.8 cm hypodense liver mass adjacent to the gallbladder fossa (series 2/ image 67), previously 2.5 x 2.4 cm, decreased. There is a stable simple 1.4 cm liver cyst in the inferior right liver lobe. No additional liver masses. Normal gallbladder with no definite radiopaque cholelithiasis. No biliary ductal dilatation.  Pancreas: Normal, with no mass or duct dilation.  Spleen: Normal size. No mass.  Adrenals/Urinary Tract: 1.4 x 1.1 cm left adrenal nodule (series 2/ image 70), previously 13.7 x 3.4 cm, significantly decreased. No right adrenal nodules. No hydronephrosis. No renal masses. No gross urothelial masses in the renal collecting systems or ureters, although there is limited evaluation of much of the mid portions of both ureters due to non opacification. Normal bladder.  Stomach/Bowel: Small hiatal hernia. Otherwise grossly normal collapsed stomach. Normal caliber small bowel with no small bowel wall thickening. Status post appendectomy. Mild sigmoid diverticulosis, with no large bowel wall thickening or pericolonic fat stranding.  Vascular/Lymphatic: Atherosclerotic nonaneurysmal abdominal aorta. Patent portal, splenic and renal veins. IVC filter is well-positioned below the level of the renal veins. Mildly enlarged 1.8 cm portacaval node (series 2/ image 69), previously 2.5 cm, decreased. No additional residual or new pathologically enlarged abdominopelvic nodes.  Reproductive: Stable mild prostatomegaly.  Other: No pneumoperitoneum, ascites or focal fluid collection.  Musculoskeletal: Slightly increased sclerosis within the predominantly lytic large at L2 vertebral osseous lesion. No new suspicious osseous lesions. Moderate degenerative changes in the lumbar spine. Stable small fat containing left inguinal hernia. Mild anasarca.  IMPRESSION: 1. Significant interval  treatment response. Solitary residual liver metastasis, which has decreased in size. Small residual left adrenal metastasis, significantly decreased in size. Solitary residual enlarged retroperitoneal node, decreased in size. Increased sclerosis within the L2 vertebral lytic lesion, suggesting treatment effect. No new or progressive sites of metastatic disease. 2. Two stable subcentimeter pulmonary nodules, indeterminate, favor benign nodules. 3. Trace bilateral pleural effusions, decreased bilaterally. Essentially resolved bilateral lung consolidation seen on the 08/02/2015 chest CT. 4. Additional findings include coronary atherosclerosis, mild emphysema, small hiatal hernia, mild sigmoid diverticulosis and mild prostatomegaly.   Electronically Signed  By: Ilona Sorrel M.D.  On: 08/10/2015 09:26     ASSESSMENT AND PLAN: Stage IV urothelial carcinoma Liver Metastases Adrenal Metastases  Chest pain/GERD History of esophageal stricture Carboplatin/Gemzar with excellent tolerance and response Anemia, treatment related NSTEMI.  Atrial fibrillation with RVR CARBO/GEMZAR dose reduction secondary to above and pancytopenia Respiratory Arrest secondary to DVT/PE, IVC filter  We reviewed his imaging studies in detail. Things are markedly improved. Given his difficulties with therapy I have recommended a break from therapy with active observation.   He will return on Monday for a CBC. Once counts improve and show stability over time we can consider starting him on anticoagulation with the potential for filter removal.   We will repeat his scans in 2 months.   He will return in 2 weeks for ongoing follow up.   All questions were answered. The patient knows to call the clinic with any problems, questions or concerns. We can certainly see the patient much sooner if necessary.  This document serves as a record of services personally performed by Ancil Linsey, MD. It was created  on her behalf by Kandace Blitz, a trained medical scribe. The creation of this record is based on the scribe's personal observations and the provider's statements to them. This document has been checked and approved by the attending provider.  I have reviewed the above documentation for accuracy and completeness, and I agree with the above.  This note is electronically signed by: Molli Hazard, MD  09/24/2015 7:45 AM

## 2015-08-11 NOTE — Patient Instructions (Addendum)
[place] at [facility] Discharge Instructions  RECOMMENDATIONS MADE BY THE CONSULTANT AND ANY TEST RESULTS WILL BE SENT TO YOUR REFERRING PHYSICIAN.   Exam and discussion by Dr Whitney Muse today. We are going to stop chemotherapy for right now. Your scans are dramatically improved. We are going to check you blood counts weekly (check on Monday) to see if your counts stabilize, and we can get the filter out. (possible aranesp this day) Scans again in about 2 months  Return to see the doctor in 2-3 weeks (we may do x-geva with your doctors appt if you calcium is good) Please call the clinic if you have any questions or concerns      Thank you for choosing [place] at [facility] to provide your oncology and hematology care.  To afford each patient quality time with our provider, please arrive at least 15 minutes before your scheduled appointment time.   Beginning January 23rd 2017 lab work for the [name] will be done in the  Main lab at [facility] on 1st floor. If you have a lab appointment with the [facility] please come in thru the  Main Entrance and check in at the main information desk  You need to re-schedule your appointment should you arrive 10 or more minutes late.  We strive to give you quality time with our providers, and arriving late affects you and other patients whose appointments are after yours.  Also, if you no show three or more times for appointments you may be dismissed from the clinic at the providers discretion.     Again, thank you for choosing [facility].  Our hope is that these requests will decrease the amount of time that you wait before being seen by our physicians.       _____________________________________________________________  Should you have questions after your visit to [facility], please contact our office at [phone] between the hours of 8:30 a.m. and 4:30 p.m.   Voicemails left after 4:30 p.m. will not be returned until the following business day.  For prescription refill requests, have your pharmacy contact our office.         Resources For Cancer Patients and their Caregivers ? American Cancer Society: Can assist with transportation, wigs, general needs, runs Look Good Feel Better.        ([phone] 514-6403 ? Cancer Care: Provides financial assistance, online support groups, medication/co-pay assistance.  1-800-813-HOPE 559-148-2410) ? [facility] Assists [place] cancer patients and their families through emotional , educational and financial support.  208-677-5773 ? [place] DSS Where to apply for food stamps, Medicaid and utility assistance. 403-855-3269 ? [facility]: Transportation to medical appointments. (541) 792-0607 ? Social Security Administration: May apply for disability if have a Stage IV cancer. 770-004-3051 785-719-6524 ? [employer], Disability and Transit Services: Assists with nutrition, care and transit needs. 715-082-3245

## 2015-08-12 ENCOUNTER — Other Ambulatory Visit (HOSPITAL_COMMUNITY): Payer: Medicare HMO

## 2015-08-12 ENCOUNTER — Ambulatory Visit (INDEPENDENT_AMBULATORY_CARE_PROVIDER_SITE_OTHER): Payer: Medicare HMO | Admitting: Cardiology

## 2015-08-12 ENCOUNTER — Telehealth: Payer: Self-pay | Admitting: Cardiology

## 2015-08-12 ENCOUNTER — Encounter: Payer: Self-pay | Admitting: Cardiology

## 2015-08-12 ENCOUNTER — Ambulatory Visit (HOSPITAL_COMMUNITY): Payer: Medicare HMO

## 2015-08-12 VITALS — BP 124/60 | HR 88 | Ht 76.0 in | Wt 270.0 lb

## 2015-08-12 DIAGNOSIS — I480 Paroxysmal atrial fibrillation: Secondary | ICD-10-CM

## 2015-08-12 DIAGNOSIS — I5022 Chronic systolic (congestive) heart failure: Secondary | ICD-10-CM

## 2015-08-12 DIAGNOSIS — I2510 Atherosclerotic heart disease of native coronary artery without angina pectoris: Secondary | ICD-10-CM | POA: Diagnosis not present

## 2015-08-12 NOTE — Telephone Encounter (Signed)
Will forward to Dr. Branch. 

## 2015-08-12 NOTE — Progress Notes (Signed)
Patient ID: Harry Franklin, male   DOB: Nov 19, 1940, 75 y.o.   MRN: [id number]     Clinical Summary Harry Franklin is a 75 y.o.male seen today for follow up of the following medical problems.   1. CAD - admit Jan 2017 with NSTEMI with peak trop 8.68, no EKG changes. Echo with LVEF 40-45% with WMAs primarily involving the apex. No cardiopulmonary symptoms. 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 Given his diffusely metastatic bladder cancer and severe pancytopenia due to recent chemotherapy he was managed medically.  - follow up [place] nuclear stres test on 07/06/15 that showed prior myocardial infarction with minimal peri-infarct ischemia in the apex and in the septal/inferoseptal/inferior wall territories - denies any chest pain. Can have some occasional SOB/DOE.   2. Afib - new diagnosis during recent admit, was not anticoagulated due to severe pancytopenia and metastatic CA.  - denies any palpiations.   3. 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 DVT/Presumed PE - recent admit with SOB. Patient with PEA arrest. Found to have DVT, presumed possible PE as etiology of arrest.  - IVC filter placed 07/2015 - no coumadin due to low platelets and Hgb per oncology - now off chemo, once counts have normalized likely restart coumadin and remove filter.    5. Chronic sysotlic HF - [id number] LVEF down to 25-30%. Had been 40-45% by echo 06/2015 - diuresed 7 liters during recent admission with 13 lbs weight loss. Discharge weight 267 lbs.  - weights at home stable around 270 lbs. Taking lasix 40mg  daily. Denies any LE edema. - limiting sodium intake.  - no ACE-I due to poor renal function.     Past Medical History  Diagnosis Date  . GERD (gastroesophageal reflux disease)   . Hypokalemia   . Hypertension   . Cataract   . Arthritis   . Macular degeneration, age related   . DVT (deep venous thrombosis) ([place])     developed after traveling x 1, developed another after  left foot surgery  . Hydrocele in adult   . Metastatic urothelial carcinoma ([place]) 04/12/2015     Allergies  Allergen Reactions  . Neulasta [Pegfilgrastim] Hives    Pt reports breaking out in hives less than 24 hours after taking neulasta. Hives appear on arms and legs.  . Rabeprazole     Other reaction(s): DIARRHEA  . Vancomycin Hives and Rash     Current Outpatient Prescriptions  Medication Sig Dispense Refill  . acetaminophen (TYLENOL) 325 MG tablet Take 650 mg by mouth every 6 (six) hours as needed for mild pain or fever.    Marland Kitchen aspirin 81 MG chewable tablet Chew 1 tablet (81 mg total) by mouth daily. 30 tablet 0  . atorvastatin (LIPITOR) 80 MG tablet Take 1 tablet (80 mg total) by mouth daily at 6 PM. 90 tablet 3  . Calcium Carb-Cholecalciferol (CALCIUM 500 + D3) 500-600 MG-UNIT TABS Take 2 tablets by mouth daily.    . carvedilol (COREG) 3.125 MG tablet Take 1 tablet (3.125 mg total) by mouth 2 (two) times daily with a meal. 60 tablet 0  . feeding supplement (BOOST / RESOURCE BREEZE) LIQD Take 1 Container by mouth daily.    . furosemide (LASIX) 40 MG tablet Take 1 tablet (40 mg total) by mouth daily. 30 tablet 0  . hydrocortisone cream 1 % Apply 1 application topically 2 (two) times daily as needed for itching (skin irritation on chest).    Marland Kitchen  Multiple Vitamins-Minerals (PRESERVISION AREDS 2 PO) Take 1 tablet by mouth 2 (two) times daily.    . ondansetron (ZOFRAN) 8 MG tablet Take 1 tablet (8 mg total) by mouth every 8 (eight) hours as needed for nausea or vomiting. 30 tablet 2  . pantoprazole (PROTONIX) 40 MG tablet Take 1 tablet (40 mg total) by mouth daily at 12 noon. 30 tablet 0  . potassium chloride 20 MEQ TBCR Take 20 mEq by mouth daily. 30 tablet 0   No current facility-administered medications for this visit.     Past Surgical History  Procedure Laterality Date  . Replacement total knee    . Appendectomy    . Hammer toe surgery Left   . Lapidus procedure    . Hallux  fusion    . Colonoscopy  03/31/05  . Colonoscopy N/A 10/02/2014    [id number] diverticulosis  . Esophagogastroduodenoscopy N/A 10/02/2014    [id number] ulcerative reflux/s/p dilation/large HH  . [place] dilation N/A 10/02/2014    Procedure: Venia Minks DILATION;  Surgeon: Daneil Dolin, MD;  [facility];  Service: Endoscopy;  Laterality: N/A;  . Esophagogastroduodenoscopy N/A 01/29/2015    Procedure: ESOPHAGOGASTRODUODENOSCOPY (EGD);  Surgeon: Daneil Dolin, MD;  Location: [facility];  Service: Endoscopy;  Laterality: N/A;  0800-rescheduled to 9/9 @ 915 C[place] notified pt  . Esophageal dilation  01/29/2015    Procedure: ESOPHAGEAL DILATION;  Surgeon: Daneil Dolin, MD;  Location: [facility];  Service: Endoscopy;;  . Portacath placement Left 04/13/15     Allergies  Allergen Reactions  . Neulasta [Pegfilgrastim] Hives    Pt reports breaking out in hives less than 24 hours after taking neulasta. Hives appear on arms and legs.  . Rabeprazole     Other reaction(s): DIARRHEA  . Vancomycin Hives and Rash      Family History  Problem Relation Age of Onset  . Colon cancer Maternal Uncle   . Heart disease Father      Social History Harry Franklin reports that he quit smoking about 31 years ago. His smoking use included Cigarettes. He has a 31 pack-year smoking history. He has never used smokeless tobacco. Harry Franklin reports that he does not drink alcohol.   Review of Systems CONSTITUTIONAL: No weight loss, fever, chills, weakness or fatigue.  HEENT: Eyes: No visual loss, blurred vision, double vision or yellow sclerae.No hearing loss, sneezing, congestion, runny nose or sore throat.  SKIN: No rash or itching.  CARDIOVASCULAR: per HPI RESPIRATORY: No shortness of breath, cough or sputum.  GASTROINTESTINAL: No anorexia, nausea, vomiting or diarrhea. No abdominal pain or blood.  GENITOURINARY: No burning on urination, no polyuria NEUROLOGICAL: No headache, dizziness,  syncope, paralysis, ataxia, numbness or tingling in the extremities. No change in bowel or bladder control.  MUSCULOSKELETAL: No muscle, back pain, joint pain or stiffness.  LYMPHATICS: No enlarged nodes. No history of splenectomy.  PSYCHIATRIC: No history of depression or anxiety.  ENDOCRINOLOGIC: No reports of sweating, cold or heat intolerance. No polyuria or polydipsia.  Marland Kitchen   Physical Examination Filed Vitals:   08/12/15 0812  BP: 124/60  Pulse: 88   Filed Vitals:   08/12/15 0812  Height: 6\' 4"  (1.93 m)  Weight: 270 lb (122.471 kg)    Gen: resting comfortably, no acute distress HEENT: no scleral icterus, pupils equal round and reactive, no palptable cervical adenopathy,  CV: RRR, no m/r/g, no jvd Resp: Clear to auscultation bilaterally GI: abdomen is soft, non-tender, non-distended, normal bowel sounds,  no hepatosplenomegaly MSK: extremities are warm, no edema.  Skin: warm, no rash Neuro:  no focal deficits Psych: appropriate affect   Diagnostic Studies  06/2015 echo - Left ventricle: The cavity size was at the upper limits of normal. Wall thickness was at the upper limits of normal. Systolic function was mildly to moderately reduced. The estimated ejection fraction was in the range of 40% to 45%. There is severe hypokinesis of the mid-apicalanteroseptal, anterior, inferior, and apical myocardium. Features are consistent with a pseudonormal left ventricular filling pattern, with concomitant abnormal relaxation and increased filling pressure (grade 2 diastolic dysfunction). - Aortic valve: Mildly calcified annulus. Trileaflet; mildly calcified leaflets. - Mitral valve: There was mild regurgitation. - Left atrium: The atrium was mildly dilated. - Right atrium: Central venous pressure (est): 3 mm Hg. - Tricuspid valve: There was trivial regurgitation. - Pulmonary arteries: PA peak pressure: 19 mm Hg (S). - Pericardium, extracardiac: There was no  pericardial effusion.  Impressions:  - Upper normal LV wall thickness and chamber size with LVEF approximately 40-45%. No significant improvement noted in wall motion abnormalities described above in comparison to the recent study. Grade 2 diastolic dysfunction with increased filling pressures. Mild left atrial enlargement. Mild mitral regurgitation. Sclerotic aortic valve without stenosis. Trivial tricuspid regurgitation with PASP 19 mmHg.   Jan 2017 echo Study Conclusions  - Left ventricle: Systolic function was mildly to moderately reduced. The estimated ejection fraction was in the range of 40% to 45%. - Regional wall motion abnormality: Hypokinesis of the mid-apical anterior, basal-mid anteroseptal, mid anterolateral, apical septal, apical lateral, and apical myocardium. - Limited study with echocontrast to evaluate LV function and wall motion.  06/2015 Lexiscan MPI  There was no ST segment deviation noted during stress.  Findings consistent with prior myocardial infarction with peri-infarct ischemia in the apex and in the septal/inferoseptal/inferior wall territories. Both defects are primarily scar with fairly mild peri-infarct ischemia.  This is a high risk study. High risk based on low ejection fraction and multiple defects. There is a fairly minimal amount of myocardium currently at jeopardy.  The left ventricular ejection fraction is moderately decreased (30-44%).    07/2015 LE [place] Summary:  - Findings consistent with acute deep vein thrombosis involving the  right lower extremity. - Findings consistent with acute deep vein thrombosis involving the  left lower extremity. - No evidence of Baker&'s cyst on the right or left.  07/2015 echo  Study Conclusions  - Left ventricle: The cavity size was normal. There was mild  concentric hypertrophy with moderate focal basal hypertrophy.  Systolic function was severely reduced. The  estimated ejection  fraction was in the range of 25% to 30%. There is akinesis of the  mid-apicalanteroseptal and inferoseptal myocardium. The study is  not technically sufficient to allow evaluation of LV diastolic  function. - Aortic valve: Trileaflet; mildly thickened, mildly calcified  leaflets. - Left atrium: The atrium was mildly dilated.   Assessment and Plan   1. CAD - continue medical therapy. We have not pursued cath due to his metastatic bladder cancer and recent pancytopenia on chemotherapy. Can continue ASA as long as platelets greater than 50 thousand.    2. Afib - no recent symptoms, continue current meds - no anticoag due to metastatic bladder CA and recurrent severe pancytopenia. Appears chemo is to be stopped, with plans of starting anticoag once counts normalized given his recent DVT  3. DVT - management per heme/onc - patient with IVC filter currently given pancytopenia  and inability to start anticoag. According to patient plan to stop chemo and once counts normalize start anticoag and remove IVC filter.  4. Chronic systolic HF - continue coreg, no ACE/ARB/aldactone given recent AKI during last admission. Will reconsider if renal function improves over time.     F/u 2 weeks     Arnoldo Lenis, M.D.

## 2015-08-12 NOTE — Patient Instructions (Signed)
Medication Instructions:  Your physician recommends that you continue on your current medications as directed. Please refer to the Current Medication list given to you today.   Labwork: NONE  Testing/Procedures: NONE  Follow-Up: Your physician recommends that you schedule a follow-up appointment in: 2 WEEKS    Any Other Special Instructions Will Be Listed Below (If Applicable).  Thanks for choosing [facility]!!!     If you need a refill on your cardiac medications before your next appointment, please call your pharmacy.

## 2015-08-12 NOTE — Telephone Encounter (Signed)
Pt called to let Dr. Harl Bowie know his Coreg is 6.25mg  2x a day

## 2015-08-13 ENCOUNTER — Ambulatory Visit (HOSPITAL_COMMUNITY): Payer: Medicare HMO

## 2015-08-13 ENCOUNTER — Telehealth: Payer: Self-pay | Admitting: Cardiology

## 2015-08-13 ENCOUNTER — Other Ambulatory Visit (HOSPITAL_COMMUNITY): Payer: Medicare HMO

## 2015-08-13 NOTE — Telephone Encounter (Signed)
Patient is taking Coreg 6.25mg  . / tg

## 2015-08-16 ENCOUNTER — Encounter (HOSPITAL_COMMUNITY): Payer: Medicare HMO

## 2015-08-16 DIAGNOSIS — C7910 Secondary malignant neoplasm of unspecified urinary organs: Secondary | ICD-10-CM

## 2015-08-16 LAB — CBC WITH DIFFERENTIAL/PLATELET
Basophils Absolute: 0 10*3/uL (ref 0.0–0.1)
Basophils Relative: 0 %
EOS ABS: 0.1 10*3/uL (ref 0.0–0.7)
Eosinophils Relative: 2 %
HEMATOCRIT: 39.2 % (ref 39.0–52.0)
HEMOGLOBIN: 12.7 g/dL — AB (ref 13.0–17.0)
LYMPHS ABS: 1 10*3/uL (ref 0.7–4.0)
Lymphocytes Relative: 27 %
MCH: 33.1 pg (ref 26.0–34.0)
MCHC: 32.4 g/dL (ref 30.0–36.0)
MCV: 102.1 fL — ABNORMAL HIGH (ref 78.0–100.0)
MONO ABS: 0.9 10*3/uL (ref 0.1–1.0)
MONOS PCT: 23 %
NEUTROS ABS: 1.8 10*3/uL (ref 1.7–7.7)
NEUTROS PCT: 48 %
Platelets: 158 10*3/uL (ref 150–400)
RBC: 3.84 MIL/uL — ABNORMAL LOW (ref 4.22–5.81)
RDW: 22.9 % — AB (ref 11.5–15.5)
WBC: 3.8 10*3/uL — ABNORMAL LOW (ref 4.0–10.5)

## 2015-08-16 LAB — COMPREHENSIVE METABOLIC PANEL
ALK PHOS: 120 U/L (ref 38–126)
ALT: 24 U/L (ref 17–63)
ANION GAP: 7 (ref 5–15)
AST: 28 U/L (ref 15–41)
Albumin: 3.4 g/dL — ABNORMAL LOW (ref 3.5–5.0)
BILIRUBIN TOTAL: 1.2 mg/dL (ref 0.3–1.2)
BUN: 26 mg/dL — ABNORMAL HIGH (ref 6–20)
CALCIUM: 8.6 mg/dL — AB (ref 8.9–10.3)
CO2: 29 mmol/L (ref 22–32)
Chloride: 101 mmol/L (ref 101–111)
Creatinine, Ser: 1.36 mg/dL — ABNORMAL HIGH (ref 0.61–1.24)
GFR calc non Af Amer: 50 mL/min — ABNORMAL LOW (ref >60)
GFR, EST AFRICAN AMERICAN: 58 mL/min — AB (ref >60)
Glucose, Bld: 74 mg/dL (ref 65–99)
POTASSIUM: 4.1 mmol/L (ref 3.5–5.1)
Sodium: 137 mmol/L (ref 135–145)
TOTAL PROTEIN: 6.8 g/dL (ref 6.5–8.1)

## 2015-08-16 NOTE — Progress Notes (Signed)
No aranesp today, hgb 12/7 Return as scheduled

## 2015-08-18 ENCOUNTER — Inpatient Hospital Stay (HOSPITAL_COMMUNITY): Payer: Medicare HMO

## 2015-08-19 ENCOUNTER — Other Ambulatory Visit (HOSPITAL_COMMUNITY): Payer: Self-pay | Admitting: *Deleted

## 2015-08-19 ENCOUNTER — Telehealth (HOSPITAL_COMMUNITY): Payer: Self-pay | Admitting: *Deleted

## 2015-08-19 DIAGNOSIS — C7910 Secondary malignant neoplasm of unspecified urinary organs: Secondary | ICD-10-CM

## 2015-08-19 MED ORDER — AMOXICILLIN-POT CLAVULANATE 875-125 MG PO TABS: 1.0000 | ORAL_TABLET | Freq: Two times a day (BID) | ORAL | Status: DC

## 2015-08-19 NOTE — Telephone Encounter (Signed)
Patient called complaining of being stopped up and having a temp yday evening 100.9. Tom recommended Augmentin BID x 5 days. Med escribed to [place] and pt notified. Pt going to dentist today.

## 2015-08-23 ENCOUNTER — Encounter (HOSPITAL_COMMUNITY): Payer: Medicare HMO | Attending: Hematology & Oncology

## 2015-08-23 DIAGNOSIS — C7910 Secondary malignant neoplasm of unspecified urinary organs: Secondary | ICD-10-CM

## 2015-08-23 LAB — COMPREHENSIVE METABOLIC PANEL
ALBUMIN: 3.1 g/dL — AB (ref 3.5–5.0)
ALK PHOS: 91 U/L (ref 38–126)
ALT: 26 U/L (ref 17–63)
ANION GAP: 7 (ref 5–15)
AST: 38 U/L (ref 15–41)
BUN: 23 mg/dL — ABNORMAL HIGH (ref 6–20)
CALCIUM: 7.9 mg/dL — AB (ref 8.9–10.3)
CO2: 26 mmol/L (ref 22–32)
Chloride: 107 mmol/L (ref 101–111)
Creatinine, Ser: 1.15 mg/dL (ref 0.61–1.24)
GFR calc non Af Amer: >60 mL/min (ref >60)
Glucose, Bld: 122 mg/dL — ABNORMAL HIGH (ref 65–99)
POTASSIUM: 3.9 mmol/L (ref 3.5–5.1)
SODIUM: 140 mmol/L (ref 135–145)
TOTAL PROTEIN: 6.4 g/dL — AB (ref 6.5–8.1)
Total Bilirubin: 0.8 mg/dL (ref 0.3–1.2)

## 2015-08-23 LAB — CBC WITH DIFFERENTIAL/PLATELET
BASOS PCT: 0 %
Basophils Absolute: 0 10*3/uL (ref 0.0–0.1)
EOS ABS: 0 10*3/uL (ref 0.0–0.7)
EOS PCT: 0 %
HCT: 38.1 % — ABNORMAL LOW (ref 39.0–52.0)
HEMOGLOBIN: 12.4 g/dL — AB (ref 13.0–17.0)
LYMPHS ABS: 0.8 10*3/uL (ref 0.7–4.0)
Lymphocytes Relative: 29 %
MCH: 33.2 pg (ref 26.0–34.0)
MCHC: 32.5 g/dL (ref 30.0–36.0)
MCV: 102.1 fL — ABNORMAL HIGH (ref 78.0–100.0)
MONO ABS: 0.5 10*3/uL (ref 0.1–1.0)
MONOS PCT: 17 %
NEUTROS PCT: 54 %
Neutro Abs: 1.5 10*3/uL — ABNORMAL LOW (ref 1.7–7.7)
Platelets: 165 10*3/uL (ref 150–400)
RBC: 3.73 MIL/uL — ABNORMAL LOW (ref 4.22–5.81)
RDW: 21.5 % — AB (ref 11.5–15.5)
WBC: 2.8 10*3/uL — ABNORMAL LOW (ref 4.0–10.5)

## 2015-08-25 ENCOUNTER — Encounter (HOSPITAL_COMMUNITY): Payer: Medicare HMO

## 2015-08-25 ENCOUNTER — Encounter (HOSPITAL_BASED_OUTPATIENT_CLINIC_OR_DEPARTMENT_OTHER): Payer: Medicare HMO | Admitting: Hematology & Oncology

## 2015-08-25 ENCOUNTER — Encounter (HOSPITAL_COMMUNITY): Payer: Self-pay | Admitting: Hematology & Oncology

## 2015-08-25 VITALS — BP 124/75 | HR 77 | Temp 97.4°F | Resp 20 | Wt 261.4 lb

## 2015-08-25 DIAGNOSIS — C7971 Secondary malignant neoplasm of right adrenal gland: Secondary | ICD-10-CM

## 2015-08-25 DIAGNOSIS — R0602 Shortness of breath: Secondary | ICD-10-CM

## 2015-08-25 DIAGNOSIS — Z86718 Personal history of other venous thrombosis and embolism: Secondary | ICD-10-CM

## 2015-08-25 DIAGNOSIS — C7951 Secondary malignant neoplasm of bone: Secondary | ICD-10-CM | POA: Diagnosis not present

## 2015-08-25 DIAGNOSIS — C787 Secondary malignant neoplasm of liver and intrahepatic bile duct: Secondary | ICD-10-CM

## 2015-08-25 DIAGNOSIS — C649 Malignant neoplasm of unspecified kidney, except renal pelvis: Secondary | ICD-10-CM

## 2015-08-25 DIAGNOSIS — C7910 Secondary malignant neoplasm of unspecified urinary organs: Secondary | ICD-10-CM | POA: Diagnosis not present

## 2015-08-25 DIAGNOSIS — D7589 Other specified diseases of blood and blood-forming organs: Secondary | ICD-10-CM

## 2015-08-25 DIAGNOSIS — C7972 Secondary malignant neoplasm of left adrenal gland: Secondary | ICD-10-CM

## 2015-08-25 DIAGNOSIS — Z86711 Personal history of pulmonary embolism: Secondary | ICD-10-CM

## 2015-08-25 DIAGNOSIS — I749 Embolism and thrombosis of unspecified artery: Secondary | ICD-10-CM

## 2015-08-25 LAB — VITAMIN B12: VITAMIN B 12: 1379 pg/mL — AB (ref 180–914)

## 2015-08-25 NOTE — Progress Notes (Signed)
Pt notified to start taking his Xarelto, check labs Tuesday, Friday, new lab appt given to pt, verbalized understanding

## 2015-08-25 NOTE — Patient Instructions (Addendum)
[place] at [facility] Discharge Instructions  RECOMMENDATIONS MADE BY THE CONSULTANT AND ANY TEST RESULTS WILL BE SENT TO YOUR REFERRING PHYSICIAN.    Exam and discussion by Dr Whitney Muse today Stop by the lab and get a folic acid and [id number] drawn, we will call you with those results  aranesp every 21 days on 4/17 She is going to send Dr Harl Bowie a note, we need to eventually get you started on a blood thinner No x-geva today your calcium was too low  X-geva monthly Write down your fevers in a journal  Return to see the doctor in 2 weeks  Please call the clinic if you have any questions or concerns      Thank you for choosing [place] at [facility] to provide your oncology and hematology care.  To afford each patient quality time with our provider, please arrive at least 15 minutes before your scheduled appointment time.   Beginning January 23rd 2017 lab work for the [name] will be done in the  Main lab at [facility] on 1st floor. If you have a lab appointment with the [facility] please come in thru the  Main Entrance and check in at the main information desk  You need to re-schedule your appointment should you arrive 10 or more minutes late.  We strive to give you quality time with our providers, and arriving late affects you and other patients whose appointments are after yours.  Also, if you no show three or more times for appointments you may be dismissed from the clinic at the providers discretion.     Again, thank you for choosing [facility].  Our hope is that these requests will decrease the amount of time that you wait before being seen by our physicians.       _____________________________________________________________  Should you have questions after your visit to [facility], please contact our office at [phone] between the hours of 8:30 a.m. and 4:30 p.m.  Voicemails left after 4:30  p.m. will not be returned until the following business day.  For prescription refill requests, have your pharmacy contact our office.         Resources For Cancer Patients and their Caregivers ? American Cancer Society: Can assist with transportation, wigs, general needs, runs Look Good Feel Better.        (316)333-6961 ? Cancer Care: Provides financial assistance, online support groups, medication/co-pay assistance.  1-800-813-HOPE 402-137-4070) ? [facility] [place] Assists [place] cancer patients and their families through emotional , educational and financial support.  (417)687-5172 ? [place] DSS Where to apply for food stamps, Medicaid and utility assistance. 8257833395 ? [facility]: Transportation to medical appointments. 513 335 5244 ? Social Security Administration: May apply for disability if have a Stage IV cancer. 562 114 1606 (323)404-0775 ? [employer], Disability and Transit Services: Assists with nutrition, care and transit needs. 478-461-6538

## 2015-08-25 NOTE — Progress Notes (Signed)
No xgeva today. Corrected calcium 8.6

## 2015-08-25 NOTE — Progress Notes (Signed)
Harry Franklin Harry Franklin  B12 deficiency - Plan: Vitamin B12, Folate, Vitamin X83, Folate  Folic acid deficiency - Plan: Vitamin B12, Folate, Vitamin B12, Folate  Thromboembolism (HCC) - Plan: CBC with Differential  CURRENT THERAPY: Carboplatin/Gemcitabine in a day 1, 8 every 21 day fashion  INTERVAL HISTORY: Harry Franklin 75 y.o. 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    Metastatic urothelial carcinoma ([place])   04/02/2015 Imaging CT abd/pelvis- Innumberable hepatic masses, B/L adrenal mets, abdominopelvic lymphadenopathy, LLL lung lesion 6 mm, Lytic lesion of right L2 vertebral bosy, circumferential wall thickening of lower thoracic esophagus, mild diffuse bladder wall thickening.   04/02/2015 Pathology Results Urine Cytology- cells present suspicious for malignancy.  Suspicious for high-grade urothelial carcinoma.   04/08/2015 Procedure US guided biopsy of hepatic lesion   04/08/2015 Pathology Results Liver, needle/core biopsy, right - METASTATIC HIGH GRADE CARCINOMA,   04/14/2015 - 07/21/2015 Chemotherapy Carboplatin/Gemcitabine Days 1, 8 every 21 days   05/25/2015 Imaging CT with interval response to therapy, improvement in multifocal liver mets, improvement in enlarged abdominal and pelvic adenopahty and bilateral adrenal gland metastatese, stable appearance of lytic lesion in L2   06/02/2015 Miscellaneous Xgeva to reduce the risk of SLE.  120 mg SQ every month.   06/08/2015 - 06/11/2015 Hospital Admission NSTEMI, Afib with RVR, Hypertension, Chemotherapy induced pancyopenia   06/28/2015 Treatment Plan Change Carboplatin dose reduced to AUC of 4 and Gemcitabine dose reduced by 20%   08/02/2015 - 08/09/2015 Hospital Admission Respiratory Arrest, acute bilateral DVT, presumed PE. IVC filter placed secondary to progressive anemia on  anticoagulation   08/10/2015 Imaging CT CAP- Significant interval treatment response.   Harry Franklin was here with 3 family members.  He states that he is taking calcium and vitamin D every day. He takes 2 Caltrate plus D at a time. He said that he has finally quit running a temperature on the 5th day of his medication. He thought that this was because his mouth was causing problems. His sister said that he had a fever 2 days ago but it was low grade at 99.3. When he had this he felt bad and "knew I wasn't feeling good". He said that he had one fever that was at 100.1 but didn't know what day this was. He says his normal temperature is 97.4.  He said that he feels like he is having shortness of breath again. He wants to get his heart catheterization done soon because of this. He states that he was active last week though. He notes that he does what he wants and does all his ADL's.  He said that he is eating good but is afraid that he is going to gain too much weight back because of this.  He does not want to start his XARELTO yet because he recalls Dr. Harl Franklin discussing a heart cath with him in the past.   No abdominal pain. No nausea or vomiting No CP.  Past Medical History  Diagnosis Date  . GERD (gastroesophageal reflux disease)   . Hypokalemia   . Hypertension   . Cataract   . Arthritis   . Macular degeneration, age related   . DVT (deep venous thrombosis) ([place])     developed after traveling x 1, developed another after left foot surgery  . Hydrocele in adult   . Metastatic urothelial carcinoma (  [place]) 04/12/2015  . Bone metastases ([place]) 09/21/2015    has Dysphagia; Encounter for screening colonoscopy; Dysphagia, pharyngoesophageal phase; Reflux esophagitis; Peptic stricture of esophagus; Hiatal hernia; Abdominal pain; Metastatic urothelial carcinoma ([place]); Dyspnea; Elevated troponin; Atrial fibrillation with rapid ventricular response ([place]); Antineoplastic chemotherapy induced  pancytopenia [facility]); NSTEMI (non-ST elevated myocardial infarction) ([place]); Recurrent falls; Acute pulmonary edema ([place]); Cardiac arrest ([place]); Respiratory arrest ([place]); AKI (acute kidney injury) ([place]); Thrombocytopenia ([place]); Absolute anemia; Acute hypoxemic respiratory failure ([place]); Malignant neoplasm of urinary bladder ([place]); Acute pulmonary embolism ([place]); and Bone metastases ([place]) on his problem list.     is allergic to neulasta; rabeprazole; and vancomycin.  Current Outpatient Prescriptions on File Prior to Visit  Medication Sig Dispense Refill  . acetaminophen (TYLENOL) 325 MG tablet Take 650 mg by mouth every 6 (six) hours as needed for mild pain or fever. Reported on 08/12/2015    . atorvastatin (LIPITOR) 80 MG tablet Take 1 tablet (80 mg total) by mouth daily at 6 PM. 90 tablet 3  . Calcium Carb-Cholecalciferol (CALCIUM 500 + D3) 500-600 MG-UNIT TABS Take 2 tablets by mouth daily.    . furosemide (LASIX) 40 MG tablet Take 1 tablet (40 mg total) by mouth daily. 30 tablet 0  . Multiple Vitamins-Minerals (PRESERVISION AREDS 2 PO) Take 1 tablet by mouth 2 (two) times daily.    . potassium chloride 20 MEQ TBCR Take 20 mEq by mouth daily. 30 tablet 0   No current facility-administered medications on file prior to visit.    Past Surgical History  Procedure Laterality Date  . Replacement total knee    . Appendectomy    . Hammer toe surgery Left   . Lapidus procedure    . Hallux fusion    . Colonoscopy  03/31/05  . Colonoscopy N/A 10/02/2014    GLO:VFIEPPI diverticulosis  . Esophagogastroduodenoscopy N/A 10/02/2014    RJJ:OACZY ulcerative reflux/s/p dilation/large HH  . Maloney dilation N/A 10/02/2014    Procedure: Venia Minks DILATION;  Surgeon: Daneil Dolin, [place];  [facility];  Service: Endoscopy;  Laterality: N/A;  . Esophagogastroduodenoscopy N/A 01/29/2015    Procedure: ESOPHAGOGASTRODUODENOSCOPY (EGD);  Surgeon: Daneil Dolin, [place];  [facility];  Service:  Endoscopy;  Laterality: N/A;  0800-rescheduled to 9/9 @ 915 Candy notified pt  . Esophageal dilation  01/29/2015    Procedure: ESOPHAGEAL DILATION;  Surgeon: Daneil Dolin, [place];  [facility];  Service: Endoscopy;;  . Portacath placement Left 04/13/15    REVIEW OF SYSTEMS  Positive for shortness of breath. Denies any headaches, dizziness, double vision, fevers, chills, night sweats, nausea, vomiting, diarrhea, constipation, chest pain, heart palpitations, shortness of breath, blood in stool, black tarry stool, urinary pain, urinary burning, urinary frequency, hematuria.  14 point review of systems was performed and is negative except as detailed under history of present illness and above  PHYSICAL EXAMINATION  ECOG PERFORMANCE STATUS: 0 - Asymptomatic   Vitals with BMI 08/25/2015  Height   Weight 261 lbs 6 oz  BMI   Systolic 606  Diastolic 75  Pulse 77  Respirations 20   GENERAL:alert, no distress, well nourished, well developed, comfortable, cooperative, smiling and accompanied by family. SKIN: skin color, texture, turgor are normal, no rashes or significant lesions HEAD: Normocephalic, No masses, lesions, tenderness or abnormalities EYES: normal, EOMI, Conjunctiva are pink and non-injected EARS: External ears normal OROPHARYNX:lips, buccal mucosa, and tongue normal and mucous membranes are moist  NECK: supple, no stridor, trachea midline  LYMPH:  no palpable lymphadenopathy, no hepatosplenomegaly BREAST:not examined LUNGS: clear to auscultation and percussion HEART: regular rate & rhythm ABDOMEN:abdomen soft, non-tender, normal bowel sounds and no masses or organomegaly BACK: Back symmetric, no curvature. EXTREMITIES:less then 2 second capillary refill, no edema, no skin discoloration, no clubbing, no cyanosis. Right leg larger than left, chronic from prior right knee surgery. NEURO: alert & oriented x 3 with fluent speech, no focal motor/sensory deficits, gait  normal   LABORATORY DATA: I have reviewed the data as listed Results for DAVONN, FLANERY (MRN [id number]) as of 08/25/2015 08:10  Ref. Range 08/23/2015 08:13  Sodium Latest Ref Range: 135-145 mmol/L 140  Potassium Latest Ref Range: 3.5-5.1 mmol/L 3.9  Chloride Latest Ref Range: 101-111 mmol/L 107  CO2 Latest Ref Range: 22-32 mmol/L 26  BUN Latest Ref Range: 6-20 mg/dL 23 (H)  Creatinine Latest Ref Range: 0.61-1.24 mg/dL 1.15  Calcium Latest Ref Range: 8.9-10.3 mg/dL 7.9 (L)  EGFR (Non-African Amer.) Latest Ref Range: >60 mL/min >60  EGFR (African American) Latest Ref Range: >60 mL/min >60  Glucose Latest Ref Range: 65-99 mg/dL 122 (H)  Anion gap Latest Ref Range: 5-15  7  Alkaline Phosphatase Latest Ref Range: 38-126 U/L 91  Albumin Latest Ref Range: 3.5-5.0 g/dL 3.1 (L)  AST Latest Ref Range: 15-41 U/L 38  ALT Latest Ref Range: 17-63 U/L 26  Total Protein Latest Ref Range: 6.5-8.1 g/dL 6.4 (L)  Total Bilirubin Latest Ref Range: 0.3-1.2 mg/dL 0.8  WBC Latest Ref Range: 4.0-10.5 K/uL 2.8 (L)  RBC Latest Ref Range: 4.22-5.81 MIL/uL 3.73 (L)  Hemoglobin Latest Ref Range: 13.0-17.0 g/dL 12.4 (L)  HCT Latest Ref Range: 39.0-52.0 % 38.1 (L)  MCV Latest Ref Range: 78.0-100.0 fL 102.1 (H)  MCH Latest Ref Range: 26.0-34.0 pg 33.2  MCHC Latest Ref Range: 30.0-36.0 g/dL 32.5  RDW Latest Ref Range: 11.5-15.5 % 21.5 (H)  Platelets Latest Ref Range: 150-400 K/uL 165  Neutrophils Latest Units: % 54  Lymphocytes Latest Units: % 29  Monocytes Relative Latest Units: % 17  Eosinophil Latest Units: % 0  Basophil Latest Units: % 0  NEUT# Latest Ref Range: 1.7-7.7 K/uL 1.5 (L)  Lymphocyte # Latest Ref Range: 0.7-4.0 K/uL 0.8  Monocyte # Latest Ref Range: 0.1-1.0 K/uL 0.5  Eosinophils Absolute Latest Ref Range: 0.0-0.7 K/uL 0.0  Basophils Absolute Latest Ref Range: 0.0-0.1 K/uL 0.0  Results for SHOUA, RESSLER (MRN [id number]) as of 08/25/2015 08:10  Ref. Range 08/23/2015 08:13  Glucose Latest Ref  Range: 65-99 mg/dL 122 (H)      RADIOGRAPHIC STUDIES: I have personally reviewed the radiological images as listed and agreed with the findings in the report. NM Myocar Multi W/Spect W/Wall Motion / EF   Status: Final result       PACS Images     Show images for NM Myocar Multi W/Spect W/Wall Motion / EF     Study Result      There was no ST segment deviation noted during stress.  Findings consistent with prior myocardial infarction with peri-infarct ischemia in the apex and in the septal/inferoseptal/inferior wall territories. Both defects are primarily scar with fairly mild peri-infarct ischemia.  This is a high risk study. High risk based on low ejection fraction and multiple defects. There is a fairly minimal amount of myocardium currently at jeopardy.  The left ventricular ejection fraction is moderately decreased (30-44%).     ASSESSMENT AND PLAN: Stage IV urothelial carcinoma Liver Metastases Adrenal Metastases  Bone Metastases  Chest pain/GERD History of esophageal stricture Carboplatin/Gemzar with excellent tolerance and response Anemia, treatment related NSTEMI.  Atrial fibrillation with RVR CARBO/GEMZAR dose reduction secondary to above and pancytopenia Respiratory Arrest secondary to DVT/PE, IVC filter Macrocytic anemia Hypocalcemia  He is to continue on his calcium plus D. Calcium is too low for XGEVA today.  I have added B12 and folate to his labs given his macrocytosis.  Counts have been stable and I would like him to start anticoagulation. We will continue to monitor his counts closely.  He wants to hold on this until he discusses a "heart cath" with Dr. Harl Franklin. He recalls that they discussed he may need one.   He will return for a follow up in 2 weeks. If he does well on anticoagulation, we can proceed with filter removal.   All questions were answered. The patient knows to call the clinic with any problems, questions or concerns. We can  certainly see the patient much sooner if necessary.  This document serves as a record of services personally performed by Ancil Linsey, Franklin. It was created on her behalf by Kandace Blitz, a trained medical scribe. The creation of this record is based on the scribe's personal observations and the provider's statements to them. This document has been checked and approved by the attending provider.  I have reviewed the above documentation for accuracy and completeness, and I agree with the above.  This note is electronically signed by: Molli Hazard, Franklin  09/25/2015 6:23 PM

## 2015-08-26 ENCOUNTER — Encounter: Payer: Self-pay | Admitting: Cardiology

## 2015-08-26 ENCOUNTER — Ambulatory Visit (INDEPENDENT_AMBULATORY_CARE_PROVIDER_SITE_OTHER): Payer: Medicare HMO | Admitting: Cardiology

## 2015-08-26 VITALS — BP 124/66 | HR 74 | Ht 76.0 in | Wt 261.0 lb

## 2015-08-26 DIAGNOSIS — I2510 Atherosclerotic heart disease of native coronary artery without angina pectoris: Secondary | ICD-10-CM

## 2015-08-26 DIAGNOSIS — I480 Paroxysmal atrial fibrillation: Secondary | ICD-10-CM

## 2015-08-26 DIAGNOSIS — I5022 Chronic systolic (congestive) heart failure: Secondary | ICD-10-CM | POA: Diagnosis not present

## 2015-08-26 LAB — FOLATE: FOLATE: 25.9 ng/mL (ref >5.9)

## 2015-08-26 MED ORDER — CARVEDILOL 12.5 MG PO TABS: 12.5000 mg | ORAL_TABLET | Freq: Two times a day (BID) | ORAL | Status: DC

## 2015-08-26 MED ORDER — LISINOPRIL 2.5 MG PO TABS: 2.5000 mg | ORAL_TABLET | Freq: Every day | ORAL | Status: DC

## 2015-08-26 NOTE — Progress Notes (Signed)
Patient ID: HEMAL SPADA, male   DOB: [birth year], 75 y.o.   MRN: [id number]     Clinical Summary Mr. Melcher is a 75 y.o.male seen today for follow up of the following medical problems.   1. CAD - admit Jan 2017 with NSTEMI with peak trop 8.68, no EKG changes. Echo with LVEF 40-45% with WMAs primarily involving the apex. No cardiopulmonary symptoms. 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 Given his diffusely metastatic bladder cancer and severe pancytopenia due to recent chemotherapy he was managed medically.  - follow up [place] nuclear stres test on 07/06/15 that showed prior myocardial infarction with minimal peri-infarct ischemia in the apex and in the septal/inferoseptal/inferior wall territories  - denies any recent chest pain. Occasional SOB/DOE that is overall stable.    2. Afib - new diagnosis during recent admit, was not anticoagulated at that time due to severe pancytopenia and metastatic CA.  - denies any palpiations since last visit  - blood counts have normalized, with recent diagnosis of DVT/PE as well he has been started on xarelto  3. 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 DVT/Presumed PE - recent admit with SOB. Patient with PEA arrest. Found to have DVT, presumed possible PE as etiology of arrest.  - IVC filter placed 07/2015 - just started on xarelto by oncology   5. Chronic sysotlic HF - [id number] LVEF down to 25-30%. Had been 40-45% by echo 06/2015 - diuresed 7 liters during recent admission with 13 lbs weight loss. Discharge weight 267 lbs.  - weights at home stable around 270 lbs. Taking lasix 40mg  daily. Denies any LE edema. - limiting sodium intake.  - has not been on  ACE-I due to poor renal function.   - home weights variable, staying around 255-258 lbs. No significant edema. Past Medical History  Diagnosis Date  . GERD (gastroesophageal reflux disease)   . Hypokalemia   . Hypertension   . Cataract   . Arthritis   .  Macular degeneration, age related   . DVT (deep venous thrombosis) ([place])     developed after traveling x 1, developed another after left foot surgery  . Hydrocele in adult   . Metastatic urothelial carcinoma ([place]) 04/12/2015     Allergies  Allergen Reactions  . Neulasta [Pegfilgrastim] Hives    Pt reports breaking out in hives less than 24 hours after taking neulasta. Hives appear on arms and legs.  . Rabeprazole     Other reaction(s): DIARRHEA  . Vancomycin Hives and Rash     Current Outpatient Prescriptions  Medication Sig Dispense Refill  . acetaminophen (TYLENOL) 325 MG tablet Take 650 mg by mouth every 6 (six) hours as needed for mild pain or fever. Reported on 08/12/2015    . amoxicillin-clavulanate (AUGMENTIN) 875-125 MG tablet Take 1 tablet by mouth 2 (two) times daily. 10 tablet 0  . aspirin 81 MG chewable tablet Chew 1 tablet (81 mg total) by mouth daily. 30 tablet 0  . atorvastatin (LIPITOR) 80 MG tablet Take 1 tablet (80 mg total) by mouth daily at 6 PM. 90 tablet 3  . Calcium Carb-Cholecalciferol (CALCIUM 500 + D3) 500-600 MG-UNIT TABS Take 2 tablets by mouth daily.    . carvedilol (COREG) 6.25 MG tablet Take 6.25 mg by mouth 2 (two) times daily with a meal.    . feeding supplement (BOOST / RESOURCE BREEZE) LIQD Take 1 Container by mouth daily.    . furosemide (LASIX) 40  MG tablet Take 1 tablet (40 mg total) by mouth daily. 30 tablet 0  . hydrocortisone cream 1 % Apply 1 application topically 2 (two) times daily as needed for itching (skin irritation on chest).    . Multiple Vitamins-Minerals (PRESERVISION AREDS 2 PO) Take 1 tablet by mouth 2 (two) times daily.    . ondansetron (ZOFRAN) 8 MG tablet Take 1 tablet (8 mg total) by mouth every 8 (eight) hours as needed for nausea or vomiting. 30 tablet 2  . pantoprazole (PROTONIX) 40 MG tablet Take 1 tablet (40 mg total) by mouth daily at 12 noon. 30 tablet 0  . potassium chloride 20 MEQ TBCR Take 20 mEq by mouth daily. 30  tablet 0   No current facility-administered medications for this visit.     Past Surgical History  Procedure Laterality Date  . Replacement total knee    . Appendectomy    . Hammer toe surgery Left   . Lapidus procedure    . Hallux fusion    . Colonoscopy  03/31/05  . Colonoscopy N/A 10/02/2014    [id number] diverticulosis  . Esophagogastroduodenoscopy N/A 10/02/2014    [id number] ulcerative reflux/s/p dilation/large HH  . Maloney dilation N/A 10/02/2014    Procedure: Venia Minks DILATION;  Surgeon: Daneil Dolin, MD;  [facility];  Service: Endoscopy;  Laterality: N/A;  . Esophagogastroduodenoscopy N/A 01/29/2015    Procedure: ESOPHAGOGASTRODUODENOSCOPY (EGD);  Surgeon: Daneil Dolin, MD;  [facility];  Service: Endoscopy;  Laterality: N/A;  0800-rescheduled to 9/9 @ 915 Candy notified pt  . Esophageal dilation  01/29/2015    Procedure: ESOPHAGEAL DILATION;  Surgeon: Daneil Dolin, MD;  [facility];  Service: Endoscopy;;  . Portacath placement Left 04/13/15     Allergies  Allergen Reactions  . Neulasta [Pegfilgrastim] Hives    Pt reports breaking out in hives less than 24 hours after taking neulasta. Hives appear on arms and legs.  . Rabeprazole     Other reaction(s): DIARRHEA  . Vancomycin Hives and Rash      Family History  Problem Relation Age of Onset  . Colon cancer Maternal Uncle   . Heart disease Father      Social History Mr. [place] reports that he quit smoking about 31 years ago. His smoking use included Cigarettes. He has a 31 pack-year smoking history. He has never used smokeless tobacco. Mr. Cheers reports that he does not drink alcohol.   Review of Systems CONSTITUTIONAL: No weight loss, fever, chills, weakness or fatigue.  HEENT: Eyes: No visual loss, blurred vision, double vision or yellow sclerae.No hearing loss, sneezing, congestion, runny nose or sore throat.  SKIN: No rash or itching.  CARDIOVASCULAR: per  HPI RESPIRATORY: No shortness of breath, cough or sputum.  GASTROINTESTINAL: No anorexia, nausea, vomiting or diarrhea. No abdominal pain or blood.  GENITOURINARY: No burning on urination, no polyuria NEUROLOGICAL: No headache, dizziness, syncope, paralysis, ataxia, numbness or tingling in the extremities. No change in bowel or bladder control.  MUSCULOSKELETAL: No muscle, back pain, joint pain or stiffness.  LYMPHATICS: No enlarged nodes. No history of splenectomy.  PSYCHIATRIC: No history of depression or anxiety.  ENDOCRINOLOGIC: No reports of sweating, cold or heat intolerance. No polyuria or polydipsia.  Marland Kitchen   Physical Examination Filed Vitals:   08/26/15 0915  BP: 124/66  Pulse: 74   Filed Vitals:   08/26/15 0915  Height: 6\' 4"  (1.93 m)  Weight: 261 lb (118.389 kg)  Gen: resting comfortably, no acute distress HEENT: no scleral icterus, pupils equal round and reactive, no palptable cervical adenopathy,  CV: RRR, no m/r/g, no jvd Resp: Clear to auscultation bilaterally GI: abdomen is soft, non-tender, non-distended, normal bowel sounds, no hepatosplenomegaly MSK: extremities are warm, no edema.  Skin: warm, no rash Neuro:  no focal deficits Psych: appropriate affect   Diagnostic Studies 06/2015 echo - Left ventricle: The cavity size was at the upper limits of normal. Wall thickness was at the upper limits of normal. Systolic function was mildly to moderately reduced. The estimated ejection fraction was in the range of 40% to 45%. There is severe hypokinesis of the mid-apicalanteroseptal, anterior, inferior, and apical myocardium. Features are consistent with a pseudonormal left ventricular filling pattern, with concomitant abnormal relaxation and increased filling pressure (grade 2 diastolic dysfunction). - Aortic valve: Mildly calcified annulus. Trileaflet; mildly calcified leaflets. - Mitral valve: There was mild regurgitation. - Left atrium:  The atrium was mildly dilated. - Right atrium: Central venous pressure (est): 3 mm Hg. - Tricuspid valve: There was trivial regurgitation. - Pulmonary arteries: PA peak pressure: 19 mm Hg (S). - Pericardium, extracardiac: There was no pericardial effusion.  Impressions:  - Upper normal LV wall thickness and chamber size with LVEF approximately 40-45%. No significant improvement noted in wall motion abnormalities described above in comparison to the recent study. Grade 2 diastolic dysfunction with increased filling pressures. Mild left atrial enlargement. Mild mitral regurgitation. Sclerotic aortic valve without stenosis. Trivial tricuspid regurgitation with PASP 19 mmHg.   Jan 2017 echo Study Conclusions  - Left ventricle: Systolic function was mildly to moderately reduced. The estimated ejection fraction was in the range of 40% to 45%. - Regional wall motion abnormality: Hypokinesis of the mid-apical anterior, basal-mid anteroseptal, mid anterolateral, apical septal, apical lateral, and apical myocardium. - Limited study with echocontrast to evaluate LV function and wall motion.  06/2015 Lexiscan MPI  There was no ST segment deviation noted during stress.  Findings consistent with prior myocardial infarction with peri-infarct ischemia in the apex and in the septal/inferoseptal/inferior wall territories. Both defects are primarily scar with fairly mild peri-infarct ischemia.  This is a high risk study. High risk based on low ejection fraction and multiple defects. There is a fairly minimal amount of myocardium currently at jeopardy.  The left ventricular ejection fraction is moderately decreased (30-44%).    07/2015 LE [place] Summary:  - Findings consistent with acute deep vein thrombosis involving the  right lower extremity. - Findings consistent with acute deep vein thrombosis involving the  left lower extremity. - No evidence of Baker&'s cyst on  the right or left.  07/2015 echo  Study Conclusions  - Left ventricle: The cavity size was normal. There was mild  concentric hypertrophy with moderate focal basal hypertrophy.  Systolic function was severely reduced. The estimated ejection  fraction was in the range of 25% to 30%. There is akinesis of the  mid-apicalanteroseptal and inferoseptal myocardium. The study is  not technically sufficient to allow evaluation of LV diastolic  function. - Aortic valve: Trileaflet; mildly thickened, mildly calcified  leaflets. - Left atrium: The atrium was mildly dilated.    Assessment and Plan   1. CAD - continue medical therapy. We have not pursued cath due to his metastatic bladder cancer and recent pancytopenia on chemotherapy. - pending progress of his cancer and his blood counts consider cath in the future.   - will stop ASA at this time since starting xarelto  2. Afib - no recent symptoms, continue current meds including xarelto  3. DVT - management per heme/onc - patient with IVC filter. Just started on xarelto, follow how he tolerates and blood counts. May need retrieval of filter if tolerates well.   4. Chronic systolic HF - increase coreg 12.5mg  bid, start lisionpril 2.5mg  daily now that renal function has improved. Follow labs, he is getting weekly labs through oncology.    F/u 3-4 weeks.     Arnoldo Lenis, M.D.

## 2015-08-26 NOTE — Patient Instructions (Signed)
Your physician recommends that you schedule a follow-up appointment in: 3-4 weeks Dr Harl Bowie   STOP Aspirin   INCREASE Coreg to 12.5 mg twice a day   START Lisinopril 2.5 mg daily

## 2015-08-27 ENCOUNTER — Encounter (HOSPITAL_COMMUNITY): Payer: Medicare HMO

## 2015-08-27 DIAGNOSIS — C7910 Secondary malignant neoplasm of unspecified urinary organs: Secondary | ICD-10-CM | POA: Diagnosis not present

## 2015-08-27 DIAGNOSIS — I749 Embolism and thrombosis of unspecified artery: Secondary | ICD-10-CM

## 2015-08-27 LAB — CBC WITH DIFFERENTIAL/PLATELET
BASOS ABS: 0 10*3/uL (ref 0.0–0.1)
Basophils Relative: 0 %
Eosinophils Absolute: 0 10*3/uL (ref 0.0–0.7)
Eosinophils Relative: 1 %
HCT: 37.5 % — ABNORMAL LOW (ref 39.0–52.0)
HEMOGLOBIN: 12.3 g/dL — AB (ref 13.0–17.0)
LYMPHS ABS: 1.2 10*3/uL (ref 0.7–4.0)
LYMPHS PCT: 23 %
MCH: 33.5 pg (ref 26.0–34.0)
MCHC: 32.8 g/dL (ref 30.0–36.0)
MCV: 102.2 fL — ABNORMAL HIGH (ref 78.0–100.0)
Monocytes Absolute: 0.9 10*3/uL (ref 0.1–1.0)
Monocytes Relative: 18 %
NEUTROS ABS: 2.9 10*3/uL (ref 1.7–7.7)
NEUTROS PCT: 58 %
Platelets: 169 10*3/uL (ref 150–400)
RBC: 3.67 MIL/uL — AB (ref 4.22–5.81)
RDW: 20.7 % — ABNORMAL HIGH (ref 11.5–15.5)
WBC: 5 10*3/uL (ref 4.0–10.5)

## 2015-08-30 ENCOUNTER — Other Ambulatory Visit (HOSPITAL_COMMUNITY): Payer: Self-pay | Admitting: *Deleted

## 2015-08-31 ENCOUNTER — Encounter (HOSPITAL_COMMUNITY): Payer: Medicare HMO

## 2015-08-31 ENCOUNTER — Telehealth (HOSPITAL_COMMUNITY): Payer: Self-pay | Admitting: *Deleted

## 2015-08-31 DIAGNOSIS — C7910 Secondary malignant neoplasm of unspecified urinary organs: Secondary | ICD-10-CM | POA: Diagnosis not present

## 2015-08-31 DIAGNOSIS — I749 Embolism and thrombosis of unspecified artery: Secondary | ICD-10-CM

## 2015-08-31 LAB — CBC WITH DIFFERENTIAL/PLATELET
BASOS PCT: 0 %
Basophils Absolute: 0 10*3/uL (ref 0.0–0.1)
EOS ABS: 0.1 10*3/uL (ref 0.0–0.7)
Eosinophils Relative: 2 %
HCT: 37 % — ABNORMAL LOW (ref 39.0–52.0)
HEMOGLOBIN: 12.3 g/dL — AB (ref 13.0–17.0)
Lymphocytes Relative: 27 %
Lymphs Abs: 1.4 10*3/uL (ref 0.7–4.0)
MCH: 33.8 pg (ref 26.0–34.0)
MCHC: 33.2 g/dL (ref 30.0–36.0)
MCV: 101.6 fL — ABNORMAL HIGH (ref 78.0–100.0)
Monocytes Absolute: 0.7 10*3/uL (ref 0.1–1.0)
Monocytes Relative: 13 %
NEUTROS PCT: 58 %
Neutro Abs: 3.1 10*3/uL (ref 1.7–7.7)
Platelets: 185 10*3/uL (ref 150–400)
RBC: 3.64 MIL/uL — AB (ref 4.22–5.81)
RDW: 19.7 % — ABNORMAL HIGH (ref 11.5–15.5)
WBC: 5.3 10*3/uL (ref 4.0–10.5)

## 2015-08-31 NOTE — Telephone Encounter (Signed)
Patient let me know that yesterday his fever in the afternoon was 99.4 and today it is 99.3. WBC count and ANC are WNL as of 08/31/15. No other symptoms in relation to the low grade fever.

## 2015-09-02 ENCOUNTER — Encounter (HOSPITAL_COMMUNITY): Payer: Medicare HMO

## 2015-09-02 DIAGNOSIS — I749 Embolism and thrombosis of unspecified artery: Secondary | ICD-10-CM

## 2015-09-02 DIAGNOSIS — C7910 Secondary malignant neoplasm of unspecified urinary organs: Secondary | ICD-10-CM | POA: Diagnosis not present

## 2015-09-02 LAB — CBC WITH DIFFERENTIAL/PLATELET
BASOS ABS: 0 10*3/uL (ref 0.0–0.1)
Basophils Relative: 0 %
Eosinophils Absolute: 0.1 10*3/uL (ref 0.0–0.7)
Eosinophils Relative: 2 %
HEMATOCRIT: 37.8 % — AB (ref 39.0–52.0)
Hemoglobin: 12.4 g/dL — ABNORMAL LOW (ref 13.0–17.0)
LYMPHS ABS: 1.8 10*3/uL (ref 0.7–4.0)
LYMPHS PCT: 29 %
MCH: 33.3 pg (ref 26.0–34.0)
MCHC: 32.8 g/dL (ref 30.0–36.0)
MCV: 101.6 fL — AB (ref 78.0–100.0)
MONO ABS: 1 10*3/uL (ref 0.1–1.0)
MONOS PCT: 17 %
NEUTROS ABS: 3.2 10*3/uL (ref 1.7–7.7)
Neutrophils Relative %: 52 %
Platelets: 177 10*3/uL (ref 150–400)
RBC: 3.72 MIL/uL — ABNORMAL LOW (ref 4.22–5.81)
RDW: 19.3 % — AB (ref 11.5–15.5)
WBC: 6.2 10*3/uL (ref 4.0–10.5)

## 2015-09-06 ENCOUNTER — Other Ambulatory Visit (HOSPITAL_COMMUNITY): Payer: Medicare HMO

## 2015-09-06 ENCOUNTER — Ambulatory Visit (HOSPITAL_COMMUNITY): Payer: Medicare HMO

## 2015-09-07 ENCOUNTER — Encounter (HOSPITAL_COMMUNITY): Payer: Medicare HMO

## 2015-09-07 DIAGNOSIS — C7910 Secondary malignant neoplasm of unspecified urinary organs: Secondary | ICD-10-CM | POA: Diagnosis not present

## 2015-09-07 DIAGNOSIS — I749 Embolism and thrombosis of unspecified artery: Secondary | ICD-10-CM

## 2015-09-07 LAB — CBC WITH DIFFERENTIAL/PLATELET
BASOS PCT: 0 %
Basophils Absolute: 0 10*3/uL (ref 0.0–0.1)
EOS ABS: 0.2 10*3/uL (ref 0.0–0.7)
Eosinophils Relative: 4 %
HCT: 36.7 % — ABNORMAL LOW (ref 39.0–52.0)
Hemoglobin: 12.1 g/dL — ABNORMAL LOW (ref 13.0–17.0)
LYMPHS ABS: 1.3 10*3/uL (ref 0.7–4.0)
Lymphocytes Relative: 25 %
MCH: 33.8 pg (ref 26.0–34.0)
MCHC: 33 g/dL (ref 30.0–36.0)
MCV: 102.5 fL — ABNORMAL HIGH (ref 78.0–100.0)
MONO ABS: 0.8 10*3/uL (ref 0.1–1.0)
MONOS PCT: 16 %
Neutro Abs: 2.9 10*3/uL (ref 1.7–7.7)
Neutrophils Relative %: 55 %
Platelets: 158 10*3/uL (ref 150–400)
RBC: 3.58 MIL/uL — ABNORMAL LOW (ref 4.22–5.81)
RDW: 18.1 % — AB (ref 11.5–15.5)
WBC: 5.2 10*3/uL (ref 4.0–10.5)

## 2015-09-07 NOTE — Progress Notes (Signed)
Hemoglobin 12.1 today. Aranesp not given, treatment parameters not met.

## 2015-09-08 ENCOUNTER — Ambulatory Visit (HOSPITAL_COMMUNITY): Payer: Medicare HMO

## 2015-09-08 ENCOUNTER — Other Ambulatory Visit (HOSPITAL_COMMUNITY): Payer: Medicare HMO

## 2015-09-10 ENCOUNTER — Encounter (HOSPITAL_COMMUNITY): Payer: Medicare HMO

## 2015-09-10 ENCOUNTER — Encounter (HOSPITAL_BASED_OUTPATIENT_CLINIC_OR_DEPARTMENT_OTHER): Payer: Medicare HMO | Admitting: Hematology & Oncology

## 2015-09-10 ENCOUNTER — Encounter (HOSPITAL_COMMUNITY): Payer: Self-pay | Admitting: Hematology & Oncology

## 2015-09-10 VITALS — BP 116/70 | HR 85 | Temp 97.8°F | Resp 18 | Wt 262.2 lb

## 2015-09-10 DIAGNOSIS — I82403 Acute embolism and thrombosis of unspecified deep veins of lower extremity, bilateral: Secondary | ICD-10-CM

## 2015-09-10 DIAGNOSIS — D6481 Anemia due to antineoplastic chemotherapy: Secondary | ICD-10-CM

## 2015-09-10 DIAGNOSIS — C7951 Secondary malignant neoplasm of bone: Secondary | ICD-10-CM | POA: Diagnosis not present

## 2015-09-10 DIAGNOSIS — R0602 Shortness of breath: Secondary | ICD-10-CM

## 2015-09-10 DIAGNOSIS — C787 Secondary malignant neoplasm of liver and intrahepatic bile duct: Secondary | ICD-10-CM

## 2015-09-10 DIAGNOSIS — C649 Malignant neoplasm of unspecified kidney, except renal pelvis: Secondary | ICD-10-CM

## 2015-09-10 DIAGNOSIS — I2699 Other pulmonary embolism without acute cor pulmonale: Secondary | ICD-10-CM

## 2015-09-10 DIAGNOSIS — K219 Gastro-esophageal reflux disease without esophagitis: Secondary | ICD-10-CM

## 2015-09-10 DIAGNOSIS — R509 Fever, unspecified: Secondary | ICD-10-CM

## 2015-09-10 DIAGNOSIS — C7971 Secondary malignant neoplasm of right adrenal gland: Secondary | ICD-10-CM | POA: Diagnosis not present

## 2015-09-10 DIAGNOSIS — C7910 Secondary malignant neoplasm of unspecified urinary organs: Secondary | ICD-10-CM

## 2015-09-10 DIAGNOSIS — C7972 Secondary malignant neoplasm of left adrenal gland: Secondary | ICD-10-CM

## 2015-09-10 DIAGNOSIS — I749 Embolism and thrombosis of unspecified artery: Secondary | ICD-10-CM

## 2015-09-10 DIAGNOSIS — I4891 Unspecified atrial fibrillation: Secondary | ICD-10-CM

## 2015-09-10 LAB — URINALYSIS, ROUTINE W REFLEX MICROSCOPIC
BILIRUBIN URINE: NEGATIVE
Glucose, UA: NEGATIVE mg/dL
Ketones, ur: NEGATIVE mg/dL
Leukocytes, UA: NEGATIVE
NITRITE: NEGATIVE
PROTEIN: NEGATIVE mg/dL
Specific Gravity, Urine: 1.01 (ref 1.005–1.030)
pH: 5 (ref 5.0–8.0)

## 2015-09-10 LAB — CBC WITH DIFFERENTIAL/PLATELET
BASOS PCT: 0 %
Basophils Absolute: 0 10*3/uL (ref 0.0–0.1)
EOS ABS: 0.3 10*3/uL (ref 0.0–0.7)
EOS PCT: 4 %
HCT: 36 % — ABNORMAL LOW (ref 39.0–52.0)
HEMOGLOBIN: 12.1 g/dL — AB (ref 13.0–17.0)
LYMPHS ABS: 1.3 10*3/uL (ref 0.7–4.0)
Lymphocytes Relative: 19 %
MCH: 33.7 pg (ref 26.0–34.0)
MCHC: 33.6 g/dL (ref 30.0–36.0)
MCV: 100.3 fL — ABNORMAL HIGH (ref 78.0–100.0)
Monocytes Absolute: 0.9 10*3/uL (ref 0.1–1.0)
Monocytes Relative: 14 %
NEUTROS PCT: 63 %
Neutro Abs: 4.2 10*3/uL (ref 1.7–7.7)
PLATELETS: 154 10*3/uL (ref 150–400)
RBC: 3.59 MIL/uL — AB (ref 4.22–5.81)
RDW: 17.7 % — ABNORMAL HIGH (ref 11.5–15.5)
WBC: 6.6 10*3/uL (ref 4.0–10.5)

## 2015-09-10 LAB — COMPREHENSIVE METABOLIC PANEL
ALBUMIN: 3.3 g/dL — AB (ref 3.5–5.0)
ALK PHOS: 99 U/L (ref 38–126)
ALT: 21 U/L (ref 17–63)
ANION GAP: 9 (ref 5–15)
AST: 27 U/L (ref 15–41)
BUN: 25 mg/dL — ABNORMAL HIGH (ref 6–20)
CALCIUM: 9 mg/dL (ref 8.9–10.3)
CHLORIDE: 103 mmol/L (ref 101–111)
CO2: 27 mmol/L (ref 22–32)
CREATININE: 1.35 mg/dL — AB (ref 0.61–1.24)
GFR calc non Af Amer: 51 mL/min — ABNORMAL LOW (ref >60)
GFR, EST AFRICAN AMERICAN: 59 mL/min — AB (ref >60)
GLUCOSE: 130 mg/dL — AB (ref 65–99)
Potassium: 4.1 mmol/L (ref 3.5–5.1)
Sodium: 139 mmol/L (ref 135–145)
Total Bilirubin: 1 mg/dL (ref 0.3–1.2)
Total Protein: 7.1 g/dL (ref 6.5–8.1)

## 2015-09-10 LAB — URINE MICROSCOPIC-ADD ON
Bacteria, UA: NONE SEEN
SQUAMOUS EPITHELIAL / LPF: NONE SEEN
WBC UA: NONE SEEN WBC/hpf (ref 0–5)

## 2015-09-10 MED ORDER — HEPARIN SOD (PORK) LOCK FLUSH 100 UNIT/ML IV SOLN
INTRAVENOUS | Status: AC
  Filled 2015-09-10: qty 5

## 2015-09-10 MED ORDER — SODIUM CHLORIDE 0.9% FLUSH
10.0000 mL | INTRAVENOUS | Status: DC | PRN
  Administered 2015-09-10: 10 mL via INTRAVENOUS
  Filled 2015-09-10: qty 10

## 2015-09-10 MED ORDER — DENOSUMAB 120 MG/1.7ML ~~LOC~~ SOLN
120.0000 mg | Freq: Once | SUBCUTANEOUS | Status: AC
  Administered 2015-09-10: 120 mg via SUBCUTANEOUS
  Filled 2015-09-10: qty 1.7

## 2015-09-10 MED ORDER — HEPARIN SOD (PORK) LOCK FLUSH 100 UNIT/ML IV SOLN
500.0000 [IU] | Freq: Once | INTRAVENOUS | Status: AC
  Administered 2015-09-10: 500 [IU] via INTRAVENOUS

## 2015-09-10 NOTE — Progress Notes (Signed)
Romulo Lofstrom Guild's reason for visit today is for an injection and labs as scheduled per MD orders.  Labs were drawn prior to administration of ordered medication.Paul Dykes also received x-geva  per MD orders; see [facility] for administration details.  SERJIO STROHMAIER tolerated all procedures well and without incident; questions were answered and patient was discharged.    Paul Dykes presented for Portacath access and flush.  Proper placement of portacath confirmed by CXR.  Portacath located right chest wall accessed with  H 20 needle.  Good blood return present. Portacath flushed with 51ml NS and 500U/82ml Heparin and needle removed intact.  Procedure tolerated well and without incident.      Blood cultures x2 drawn, one from port site and one from left arm  Ardit F Mccollam's reason for visit today is for labs as scheduled per MD orders.  Venipuncture performed with a 23 gauge butterfly needle to L Antecubital.  Kaleen Mask Yankovich tolerated procedure well and without incident; questions were answered and patient was discharged.

## 2015-09-10 NOTE — Patient Instructions (Addendum)
[place] at [facility] Discharge Instructions  RECOMMENDATIONS MADE BY THE CONSULTANT AND ANY TEST RESULTS WILL BE SENT TO YOUR REFERRING PHYSICIAN.    Exam and discussion by Dr Whitney Muse today Harry Franklin today Labs stable Scans scheduled  Return to see the doctor after the scans Please call the clinic if you have any questions or concerns    Thank you for choosing [place] at [facility] to provide your oncology and hematology care.  To afford each patient quality time with our provider, please arrive at least 15 minutes before your scheduled appointment time.   Beginning January 23rd 2017 lab work for the [name] will be done in the  Main lab at [facility] on 1st floor. If you have a lab appointment with the [facility] please come in thru the  Main Entrance and check in at the main information desk  You need to re-schedule your appointment should you arrive 10 or more minutes late.  We strive to give you quality time with our providers, and arriving late affects you and other patients whose appointments are after yours.  Also, if you no show three or more times for appointments you may be dismissed from the clinic at the providers discretion.     Again, thank you for choosing [facility].  Our hope is that these requests will decrease the amount of time that you wait before being seen by our physicians.       _____________________________________________________________  Should you have questions after your visit to [facility], please contact our office at [phone] between the hours of 8:30 a.m. and 4:30 p.m.  Voicemails left after 4:30 p.m. will not be returned until the following business day.  For prescription refill requests, have your pharmacy contact our office.         Resources For Cancer Patients and their Caregivers ? American Cancer Society: Can assist with transportation, wigs,  general needs, runs Look Good Feel Better.        934-141-7476 ? Cancer Care: Provides financial assistance, online support groups, medication/co-pay assistance.  1-800-813-HOPE 515-292-5309) ? [place] Assists [place] cancer patients and their families through emotional , educational and financial support.  713-418-5918 ? [place] DSS Where to apply for food stamps, Medicaid and utility assistance. 856-730-7924 ? [facility]: Transportation to medical appointments. 906 852 0298 ? Social Security Administration: May apply for disability if have a Stage IV cancer. (901)462-5342 (413)176-0072 ? [employer], Disability and Transit Services: Assists with nutrition, care and transit needs. 2244029452

## 2015-09-10 NOTE — Progress Notes (Signed)
[place] at [place], MD [place] [address]  Metastatic urothelial carcinoma [facility]) - Plan: SCHEDULING COMMUNICATION, denosumab (XGEVA) injection 120 mg, Schedule Portacath Flush Appointment, heparin lock flush 100 unit/mL, sodium chloride flush (NS) 0.9 % injection 10 mL, CT Chest W Contrast, CT Abdomen Pelvis W Contrast, Culture, blood (routine x 2), Urinalysis, Routine w reflex microscopic, Culture, blood (routine x 2), Urinalysis, Routine w reflex microscopic    Metastatic urothelial carcinoma (HCC)   04/02/2015 Imaging CT abd/pelvis- Innumberable hepatic masses, B/L adrenal mets, abdominopelvic lymphadenopathy, LLL lung lesion 6 mm, Lytic lesion of right L2 vertebral bosy, circumferential wall thickening of lower thoracic esophagus, mild diffuse bladder wall thickening.   04/02/2015 Pathology Results Urine Cytology- cells present suspicious for malignancy.  Suspicious for high-grade urothelial carcinoma.   04/08/2015 Procedure US guided biopsy of hepatic lesion   04/08/2015 Pathology Results Liver, needle/core biopsy, right - METASTATIC HIGH GRADE CARCINOMA,   04/14/2015 - 07/21/2015 Chemotherapy Carboplatin/Gemcitabine Days 1, 8 every 21 days   05/25/2015 Imaging CT with interval response to therapy, improvement in multifocal liver mets, improvement in enlarged abdominal and pelvic adenopahty and bilateral adrenal gland metastatese, stable appearance of lytic lesion in L2   06/02/2015 Miscellaneous Xgeva to reduce the risk of SLE.  120 mg SQ every month.   06/08/2015 - 06/11/2015 Hospital Admission NSTEMI, Afib with RVR, Hypertension, Chemotherapy induced pancyopenia   06/28/2015 Treatment Plan Change Carboplatin dose reduced to AUC of 4 and Gemcitabine dose reduced by 20%   08/02/2015 - 08/09/2015 Hospital Admission Respiratory Arrest, acute bilateral DVT, presumed PE. IVC filter placed secondary to progressive  anemia on anticoagulation   08/10/2015 Imaging CT CAP- Significant interval treatment response.     CURRENT THERAPY: Carboplatin/Gemcitabine in a day 1, 8 every 21 day fashion  INTERVAL HISTORY: Harry Franklin 75 y.o. male returns for followup of Metastatic, high grade urothelial cancer. Currently on observation.  [name] returns to the [facility] today accompanied by two family members, one male and one male.  He remarks that he ran a low-grade temperatures of 99 last night. This occurs occasionally, and he reports that he checks his temperature because he feels bad. He notes that the highest temperatures he documents are approximately 99.   He has been on xarelto and tolerating it well. No gross hematuria. Counts have been holding. He is inquiring as to whether his IVC filter can be removed.   He denies a cough; he says he will sometimes get strangled on something and cough, but that it's "not really a cough" he suffers. He confirms that he is taking a bloodthinner every day twice a day; however, he notes that he did miss half of a day, the morning of the 13th.  In terms of daily things, he says he's doing what hew ants to do. He says "I'm in barnyards, believe it or not." He confirms that he pretty much feels well, aside from these intermittent low-grade temperatures. When it comes to eating, he says "it seems like suppertime, I don't really feel like eating; maybe I'm getting tired of eating what I'm cooking." He says he doesn't have much of an appetite, "maybe because I ate too much for lunch." He notes no pain, denies nausea and vomiting. He says that there is no visible blood in his urine but he knows that blood is present in there on a microscopic  level. He notes that his bowels are fine. Overall, he denies complaints.  He says if he stands up for 2-3 minutes, he starts to hurt around the middle. He notes that this pain goes away if he sits down. He says this is something he  notices, and is uncomfortable. He says "as long as I'm sitting, it's okay." He notes that this pain is diffuse, all across his abdomen. He notes that this happens usually when he's washing dishes, or otherwise standing for extended periods of time. He notes however that he continues to do all activities he desires. He denies constipation.    Past Medical History  Diagnosis Date  . GERD (gastroesophageal reflux disease)   . Hypokalemia   . Hypertension   . Cataract   . Arthritis   . Macular degeneration, age related   . DVT (deep venous thrombosis) ([place])     developed after traveling x 1, developed another after left foot surgery  . Hydrocele in adult   . Metastatic urothelial carcinoma ([place]) 04/12/2015    has Dysphagia; Encounter for screening colonoscopy; Dysphagia, pharyngoesophageal phase; Reflux esophagitis; Peptic stricture of esophagus; Hiatal hernia; Abdominal pain; Metastatic urothelial carcinoma ([place]); Dyspnea; Elevated troponin; Atrial fibrillation with rapid ventricular response ([place]); Antineoplastic chemotherapy induced pancytopenia [facility]); NSTEMI (non-ST elevated myocardial infarction) ([place]); Recurrent falls; Acute pulmonary edema ([place]); Cardiac arrest ([place]); Respiratory arrest ([place]); AKI (acute kidney injury) ([place]); Thrombocytopenia ([place]); Absolute anemia; Acute hypoxemic respiratory failure ([place]); and Malignant neoplasm of urinary bladder ([place]) on his problem list.     is allergic to neulasta; rabeprazole; and vancomycin.  Current Outpatient Prescriptions on File Prior to Visit  Medication Sig Dispense Refill  . acetaminophen (TYLENOL) 325 MG tablet Take 650 mg by mouth every 6 (six) hours as needed for mild pain or fever. Reported on 08/12/2015    . atorvastatin (LIPITOR) 80 MG tablet Take 1 tablet (80 mg total) by mouth daily at 6 PM. 90 tablet 3  . Calcium Carb-Cholecalciferol (CALCIUM 500 + D3) 500-600 MG-UNIT TABS Take 2 tablets by mouth daily.    . carvedilol (COREG) 12.5  MG tablet Take 1 tablet (12.5 mg total) by mouth 2 (two) times daily. 180 tablet 3  . feeding supplement (BOOST / RESOURCE BREEZE) LIQD Take 1 Container by mouth daily.    . furosemide (LASIX) 40 MG tablet Take 1 tablet (40 mg total) by mouth daily. 30 tablet 0  . lisinopril (PRINIVIL,ZESTRIL) 2.5 MG tablet Take 1 tablet (2.5 mg total) by mouth daily. 90 tablet 3  . Multiple Vitamins-Minerals (PRESERVISION AREDS 2 PO) Take 1 tablet by mouth 2 (two) times daily.    Marland Kitchen omeprazole (PRILOSEC) 40 MG capsule Take 40 mg by mouth daily.    . potassium chloride 20 MEQ TBCR Take 20 mEq by mouth daily. 30 tablet 0  . Rivaroxaban (XARELTO) 15 MG TABS tablet Take 15 mg by mouth 2 (two) times daily with a meal.     No current facility-administered medications on file prior to visit.    Past Surgical History  Procedure Laterality Date  . Replacement total knee    . Appendectomy    . Hammer toe surgery Left   . Lapidus procedure    . Hallux fusion    . Colonoscopy  03/31/05  . Colonoscopy N/A 10/02/2014    OAC:ZYSAYTK diverticulosis  . Esophagogastroduodenoscopy N/A 10/02/2014    ZSW:FUXNA ulcerative reflux/s/p dilation/large HH  . Maloney dilation N/A 10/02/2014    Procedure: MALONEY DILATION;  Surgeon:  Daneil Dolin, MD;  [facility];  Service: Endoscopy;  Laterality: N/A;  . Esophagogastroduodenoscopy N/A 01/29/2015    Procedure: ESOPHAGOGASTRODUODENOSCOPY (EGD);  Surgeon: Daneil Dolin, MD;  Location: [facility];  Service: Endoscopy;  Laterality: N/A;  0800-rescheduled to 9/9 @ 915 [place] notified pt  . Esophageal dilation  01/29/2015    Procedure: ESOPHAGEAL DILATION;  Surgeon: Daneil Dolin, MD;  Location: [facility];  Service: Endoscopy;;  . Portacath placement Left 04/13/15    REVIEW OF SYSTEMS  Positive for lower abdominal pain. Positive for shortness of breath. Denies any headaches, dizziness, double vision, fevers, chills, night sweats, nausea, vomiting, diarrhea,  constipation, chest pain, heart palpitations, shortness of breath, blood in stool, black tarry stool, urinary pain, urinary burning, urinary frequency, hematuria.  14 point review of systems was performed and is negative except as detailed under history of present illness and above   PHYSICAL EXAMINATION  ECOG PERFORMANCE STATUS: 0 - Asymptomatic   Vitals with BMI 07/28/2015  Height   Weight 282 lbs 3 oz  BMI   Systolic 876  Diastolic 66  Pulse 73  Respirations 18   GENERAL:alert, no distress, well nourished, well developed, comfortable, cooperative, smiling and accompanied by family. SKIN: skin color, texture, turgor are normal, no rashes or significant lesions HEAD: Normocephalic, No masses, lesions, tenderness or abnormalities EYES: normal, EOMI, Conjunctiva are pink and non-injected EARS: External ears normal OROPHARYNX:lips, buccal mucosa, and tongue normal and mucous membranes are moist  NECK: supple, no stridor, trachea midline LYMPH:  no palpable lymphadenopathy, no hepatosplenomegaly BREAST:not examined LUNGS: clear to auscultation and percussion HEART: regular rate & rhythm ABDOMEN:abdomen soft, non-tender, normal bowel sounds and no masses or organomegaly BACK: Back symmetric, no curvature. EXTREMITIES:less then 2 second capillary refill, no edema, no skin discoloration, no clubbing, no cyanosis. Right leg larger than left, chronic from prior right knee surgery. Ted hose NEURO: alert & oriented x 3 with fluent speech, no focal motor/sensory deficits, gait normal  LABORATORY DATA: I have reviewed the data as listed   Results for KAEDON, FANELLI (MRN [id number]) as of 09/10/2015 08:42  Ref. Range 09/10/2015 07:59  Sodium Latest Ref Range: 135-145 mmol/L 139  Potassium Latest Ref Range: 3.5-5.1 mmol/L 4.1  Chloride Latest Ref Range: 101-111 mmol/L 103  CO2 Latest Ref Range: 22-32 mmol/L 27  BUN Latest Ref Range: 6-20 mg/dL 25 (H)  Creatinine Latest Ref Range:  0.61-1.24 mg/dL 1.35 (H)  Calcium Latest Ref Range: 8.9-10.3 mg/dL 9.0  EGFR (Non-African Amer.) Latest Ref Range: >60 mL/min 51 (L)  EGFR (African American) Latest Ref Range: >60 mL/min 59 (L)  Glucose Latest Ref Range: 65-99 mg/dL 130 (H)  Anion gap Latest Ref Range: 5-15  9  Alkaline Phosphatase Latest Ref Range: 38-126 U/L 99  Albumin Latest Ref Range: 3.5-5.0 g/dL 3.3 (L)  AST Latest Ref Range: 15-41 U/L 27  ALT Latest Ref Range: 17-63 U/L 21  Total Protein Latest Ref Range: 6.5-8.1 g/dL 7.1  Total Bilirubin Latest Ref Range: 0.3-1.2 mg/dL 1.0  WBC Latest Ref Range: 4.0-10.5 K/uL 6.6  RBC Latest Ref Range: 4.22-5.81 MIL/uL 3.59 (L)  Hemoglobin Latest Ref Range: 13.0-17.0 g/dL 12.1 (L)  HCT Latest Ref Range: 39.0-52.0 % 36.0 (L)  MCV Latest Ref Range: 78.0-100.0 fL 100.3 (H)  MCH Latest Ref Range: 26.0-34.0 pg 33.7  MCHC Latest Ref Range: 30.0-36.0 g/dL 33.6  RDW Latest Ref Range: 11.5-15.5 % 17.7 (H)  Platelets Latest Ref Range: 150-400 K/uL 154  Neutrophils Latest Units: % 63  Lymphocytes Latest Units: % 19  Monocytes Relative Latest Units: % 14  Eosinophil Latest Units: % 4  Basophil Latest Units: % 0  NEUT# Latest Ref Range: 1.7-7.7 K/uL 4.2  Lymphocyte # Latest Ref Range: 0.7-4.0 K/uL 1.3  Monocyte # Latest Ref Range: 0.1-1.0 K/uL 0.9  Eosinophils Absolute Latest Ref Range: 0.0-0.7 K/uL 0.3  Basophils Absolute Latest Ref Range: 0.0-0.1 K/uL 0.0   Results for ABDALLAH, HERN (MRN [id number]) as of 09/10/2015 09:49  Ref. Range 08/25/2015 09:42  Vitamin B12 Latest Ref Range: 180-914 pg/mL 1379 (H)   Results for NAKHI, CHOI (MRN [id number]) as of 09/10/2015 09:49  Ref. Range 08/25/2015 09:43  Folate Latest Ref Range: >5.9 ng/mL 25.9    RADIOGRAPHIC STUDIES:  Study Result     CLINICAL DATA: Restaging stage IV urothelial carcinoma diagnosed November 2016 status post chemotherapy. Prior appendectomy.  EXAM: CT CHEST, ABDOMEN, AND PELVIS WITH  CONTRAST  TECHNIQUE: Multidetector CT imaging of the chest, abdomen and pelvis was performed following the standard protocol during bolus administration of intravenous contrast.  CONTRAST: 125m OMNIPAQUE IOHEXOL 300 MG/ML SOLN  COMPARISON: 08/02/2015 chest CT. 05/25/2015 CT chest, abdomen and pelvis.  FINDINGS: CT CHEST  Mediastinum/Nodes: Normal heart size. Trace pericardial fluid/ thickening, unchanged. Extensive coronary atherosclerosis. Right internal jugular MediPort terminates at the cavoatrial junction. Great vessels are normal in course and caliber. No central pulmonary emboli. Normal visualized thyroid. Normal esophagus. No pathologically enlarged axillary, mediastinal or hilar lymph nodes.  Lungs/Pleura: No pneumothorax. Trace layering bilateral pleural effusions, decreased bilaterally since 08/02/2015. Mild paraseptal and centrilobular emphysema in the upper lobes with diffuse bronchial wall thickening. Right middle lobe 5 mm pulmonary nodule (series 7/ image 40) is stable since 05/25/2015. Basilar left lower lobe 6 mm pulmonary nodule (series 7/ image 55) is stable since 04/02/2015. Patchy subpleural reticulation and banding at the lung apices is unchanged, in keeping with nonspecific pleural-parenchymal scarring. Patchy opacity seen in both lungs on the 08/02/2015 chest CT have largely resolved. No acute consolidative airspace disease or new significant pulmonary nodules.  Musculoskeletal: No aggressive appearing focal osseous lesions. Moderate degenerative changes in the thoracic spine. Mild anasarca in the ventral chest wall.  CT ABDOMEN AND PELVIS  Hepatobiliary: There is a 1.9 x 1.8 cm hypodense liver mass adjacent to the gallbladder fossa (series 2/ image 67), previously 2.5 x 2.4 cm, decreased. There is a stable simple 1.4 cm liver cyst in the inferior right liver lobe. No additional liver masses. Normal gallbladder with no definite  radiopaque cholelithiasis. No biliary ductal dilatation.  Pancreas: Normal, with no mass or duct dilation.  Spleen: Normal size. No mass.  Adrenals/Urinary Tract: 1.4 x 1.1 cm left adrenal nodule (series 2/ image 70), previously 13.7 x 3.4 cm, significantly decreased. No right adrenal nodules. No hydronephrosis. No renal masses. No gross urothelial masses in the renal collecting systems or ureters, although there is limited evaluation of much of the mid portions of both ureters due to non opacification. Normal bladder.  Stomach/Bowel: Small hiatal hernia. Otherwise grossly normal collapsed stomach. Normal caliber small bowel with no small bowel wall thickening. Status post appendectomy. Mild sigmoid diverticulosis, with no large bowel wall thickening or pericolonic fat stranding.  Vascular/Lymphatic: Atherosclerotic nonaneurysmal abdominal aorta. Patent portal, splenic and renal veins. IVC filter is well-positioned below the level of the renal veins. Mildly enlarged 1.8 cm portacaval node (series 2/ image 69), previously 2.5 cm, decreased. No additional residual or new pathologically enlarged abdominopelvic  nodes.  Reproductive: Stable mild prostatomegaly.  Other: No pneumoperitoneum, ascites or focal fluid collection.  Musculoskeletal: Slightly increased sclerosis within the predominantly lytic large at L2 vertebral osseous lesion. No new suspicious osseous lesions. Moderate degenerative changes in the lumbar spine. Stable small fat containing left inguinal hernia. Mild anasarca.  IMPRESSION: 1. Significant interval treatment response. Solitary residual liver metastasis, which has decreased in size. Small residual left adrenal metastasis, significantly decreased in size. Solitary residual enlarged retroperitoneal node, decreased in size. Increased sclerosis within the L2 vertebral lytic lesion, suggesting treatment effect. No new or progressive sites of metastatic  disease. 2. Two stable subcentimeter pulmonary nodules, indeterminate, favor benign nodules. 3. Trace bilateral pleural effusions, decreased bilaterally. Essentially resolved bilateral lung consolidation seen on the 08/02/2015 chest CT. 4. Additional findings include coronary atherosclerosis, mild emphysema, small hiatal hernia, mild sigmoid diverticulosis and mild prostatomegaly.   Electronically Signed  By: Ilona Sorrel M.D.  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  Atrial fibrillation with RVR CARBO/GEMZAR dose reduction secondary to above and pancytopenia Bilateral DVT/PE, respiratory arrest  He is doing well, under active observation. He is physically active, weight is stable. Low grade temps of uncertain etiology. I reassured him but will check U/A and cultures via port and peripheral blood. Next CT imaging is due at the end of May.  I will send a note to IR to see if they feel it is appropriate to remove his IVC filter.  He is to continue on Xarelto. I will notify him results of U/A and cultures obtained from today when available.  He is due for XGEVA today.  Orders Placed This Encounter  Procedures  . Culture, blood (routine x 2)    Standing Status: Future     Number of Occurrences: 1     Standing Expiration Date: 09/09/2016  . CT Chest W Contrast    Standing Status: Future     Number of Occurrences:      Standing Expiration Date: 09/09/2016    Order Specific Question:  If indicated for the ordered procedure, I authorize the administration of contrast media per Radiology protocol    Answer:  Yes    Order Specific Question:  Reason for Exam (SYMPTOM  OR DIAGNOSIS REQUIRED)    Answer:  restaging stage IV urothelial carcinoma    Order Specific  Question:  Preferred imaging location?    Answer:  [facility]  . CT Abdomen Pelvis W Contrast    Standing Status: Future     Number of Occurrences:      Standing Expiration Date: 09/09/2016    Order Specific Question:  If indicated for the ordered procedure, I authorize the administration of contrast media per Radiology protocol    Answer:  Yes    Order Specific Question:  Reason for Exam (SYMPTOM  OR DIAGNOSIS REQUIRED)    Answer:  restaging stage IV urothelial carcinoma    Order Specific Question:  Preferred imaging location?    Answer:  [facility]  . Urinalysis, Routine w reflex microscopic    Standing Status: Future     Number of Occurrences: 1     Standing Expiration Date: 09/09/2016  . SCHEDULING COMMUNICATION    Schedule 15 minute injection appointment  . Schedule Portacath Flush Appointment    Schedule Portacath flush appointment  All questions were answered. The patient knows to call the clinic with any problems, questions or concerns. We can certainly see the patient much sooner if necessary.  This document serves as a record of services personally performed by Ancil Linsey, MD. It was created on her behalf by Toni Amend, a trained medical scribe. The creation of this record is based on the scribe's personal observations and the provider's statements to them. This document has been checked and approved by the attending provider.  I have reviewed the above documentation for accuracy and completeness, and I agree with the above.  This note is electronically signed by: Molli Hazard, MD  09/10/2015 9:45 AM

## 2015-09-14 ENCOUNTER — Other Ambulatory Visit (HOSPITAL_COMMUNITY): Payer: Self-pay | Admitting: Interventional Radiology

## 2015-09-14 DIAGNOSIS — I82403 Acute embolism and thrombosis of unspecified deep veins of lower extremity, bilateral: Secondary | ICD-10-CM

## 2015-09-15 ENCOUNTER — Other Ambulatory Visit (HOSPITAL_COMMUNITY): Payer: Self-pay | Admitting: Oncology

## 2015-09-15 ENCOUNTER — Other Ambulatory Visit (HOSPITAL_COMMUNITY): Payer: Self-pay | Admitting: *Deleted

## 2015-09-15 DIAGNOSIS — I2699 Other pulmonary embolism without acute cor pulmonale: Secondary | ICD-10-CM | POA: Insufficient documentation

## 2015-09-15 DIAGNOSIS — C7910 Secondary malignant neoplasm of unspecified urinary organs: Secondary | ICD-10-CM

## 2015-09-15 LAB — CULTURE, BLOOD (ROUTINE X 2)
Culture: NO GROWTH
Culture: NO GROWTH

## 2015-09-15 MED ORDER — RIVAROXABAN 20 MG PO TABS: 20.0000 mg | ORAL_TABLET | Freq: Every day | ORAL | Status: DC

## 2015-09-15 NOTE — Progress Notes (Signed)
Samples of Xarelto were given to the patient, quantity #5 x 2, Lot Number [id number] x 2.  Reaghan Kawa, PA-C 09/15/2015 2:18 PM

## 2015-09-17 ENCOUNTER — Telehealth (HOSPITAL_COMMUNITY): Payer: Self-pay | Admitting: *Deleted

## 2015-09-17 ENCOUNTER — Encounter (HOSPITAL_COMMUNITY): Payer: Medicare HMO

## 2015-09-17 ENCOUNTER — Other Ambulatory Visit (HOSPITAL_COMMUNITY): Payer: Self-pay | Admitting: *Deleted

## 2015-09-17 DIAGNOSIS — C7910 Secondary malignant neoplasm of unspecified urinary organs: Secondary | ICD-10-CM | POA: Diagnosis not present

## 2015-09-17 DIAGNOSIS — I749 Embolism and thrombosis of unspecified artery: Secondary | ICD-10-CM

## 2015-09-17 LAB — CBC WITH DIFFERENTIAL/PLATELET
Basophils Absolute: 0 10*3/uL (ref 0.0–0.1)
Basophils Relative: 0 %
EOS ABS: 0.3 10*3/uL (ref 0.0–0.7)
EOS PCT: 5 %
HCT: 36.3 % — ABNORMAL LOW (ref 39.0–52.0)
Hemoglobin: 12.1 g/dL — ABNORMAL LOW (ref 13.0–17.0)
LYMPHS ABS: 1.3 10*3/uL (ref 0.7–4.0)
Lymphocytes Relative: 21 %
MCH: 33.8 pg (ref 26.0–34.0)
MCHC: 33.3 g/dL (ref 30.0–36.0)
MCV: 101.4 fL — AB (ref 78.0–100.0)
MONO ABS: 1.1 10*3/uL — AB (ref 0.1–1.0)
MONOS PCT: 17 %
Neutro Abs: 3.7 10*3/uL (ref 1.7–7.7)
Neutrophils Relative %: 57 %
PLATELETS: 156 10*3/uL (ref 150–400)
RBC: 3.58 MIL/uL — AB (ref 4.22–5.81)
RDW: 16.2 % — AB (ref 11.5–15.5)
WBC: 6.3 10*3/uL (ref 4.0–10.5)

## 2015-09-17 NOTE — Telephone Encounter (Signed)
Pt has agreed to take Xarelto 20mg  daily since he can get 3 months for $91. His Rx was called into [facility] several days ago.

## 2015-09-20 ENCOUNTER — Other Ambulatory Visit: Payer: Self-pay | Admitting: Radiology

## 2015-09-20 NOTE — Patient Instructions (Addendum)
Arrive 7am, driver needed, NPO after MN

## 2015-09-21 ENCOUNTER — Encounter (HOSPITAL_COMMUNITY): Payer: Self-pay | Admitting: Oncology

## 2015-09-21 ENCOUNTER — Ambulatory Visit (HOSPITAL_COMMUNITY)
Admission: RE | Admit: 2015-09-21 | Discharge: 2015-09-21 | Disposition: A | Discharge status: Home / Self Care | Payer: Medicare HMO | Source: Ambulatory Visit | Attending: Interventional Radiology | Admitting: Interventional Radiology

## 2015-09-21 ENCOUNTER — Encounter (HOSPITAL_COMMUNITY): Payer: Self-pay

## 2015-09-21 ENCOUNTER — Other Ambulatory Visit (HOSPITAL_COMMUNITY): Payer: Self-pay | Admitting: Oncology

## 2015-09-21 DIAGNOSIS — Z87891 Personal history of nicotine dependence: Secondary | ICD-10-CM | POA: Diagnosis not present

## 2015-09-21 DIAGNOSIS — E876 Hypokalemia: Secondary | ICD-10-CM | POA: Insufficient documentation

## 2015-09-21 DIAGNOSIS — H3530 Unspecified macular degeneration: Secondary | ICD-10-CM | POA: Diagnosis not present

## 2015-09-21 DIAGNOSIS — I10 Essential (primary) hypertension: Secondary | ICD-10-CM | POA: Diagnosis not present

## 2015-09-21 DIAGNOSIS — Z4689 Encounter for fitting and adjustment of other specified devices: Secondary | ICD-10-CM | POA: Diagnosis not present

## 2015-09-21 DIAGNOSIS — Z86718 Personal history of other venous thrombosis and embolism: Secondary | ICD-10-CM | POA: Diagnosis not present

## 2015-09-21 DIAGNOSIS — Z7901 Long term (current) use of anticoagulants: Secondary | ICD-10-CM | POA: Diagnosis not present

## 2015-09-21 DIAGNOSIS — I82403 Acute embolism and thrombosis of unspecified deep veins of lower extremity, bilateral: Secondary | ICD-10-CM

## 2015-09-21 DIAGNOSIS — Z8249 Family history of ischemic heart disease and other diseases of the circulatory system: Secondary | ICD-10-CM | POA: Diagnosis not present

## 2015-09-21 DIAGNOSIS — M1990 Unspecified osteoarthritis, unspecified site: Secondary | ICD-10-CM | POA: Diagnosis not present

## 2015-09-21 DIAGNOSIS — C7951 Secondary malignant neoplasm of bone: Secondary | ICD-10-CM

## 2015-09-21 DIAGNOSIS — K219 Gastro-esophageal reflux disease without esophagitis: Secondary | ICD-10-CM | POA: Insufficient documentation

## 2015-09-21 HISTORY — DX: Secondary malignant neoplasm of bone: C79.51

## 2015-09-21 LAB — PROTIME-INR
INR: 1.96 — AB (ref 0.00–1.49)
PROTHROMBIN TIME: 22.2 s — AB (ref 11.6–15.2)

## 2015-09-21 LAB — BASIC METABOLIC PANEL
ANION GAP: 8 (ref 5–15)
BUN: 20 mg/dL (ref 6–20)
CALCIUM: 8.3 mg/dL — AB (ref 8.9–10.3)
CHLORIDE: 105 mmol/L (ref 101–111)
CO2: 26 mmol/L (ref 22–32)
Creatinine, Ser: 1.25 mg/dL — ABNORMAL HIGH (ref 0.61–1.24)
GFR calc Af Amer: >60 mL/min (ref >60)
GFR calc non Af Amer: 56 mL/min — ABNORMAL LOW (ref >60)
GLUCOSE: 96 mg/dL (ref 65–99)
Potassium: 3.7 mmol/L (ref 3.5–5.1)
Sodium: 139 mmol/L (ref 135–145)

## 2015-09-21 LAB — CBC
HCT: 33.4 % — ABNORMAL LOW (ref 39.0–52.0)
HEMOGLOBIN: 11.1 g/dL — AB (ref 13.0–17.0)
MCH: 33.4 pg (ref 26.0–34.0)
MCHC: 33.2 g/dL (ref 30.0–36.0)
MCV: 100.6 fL — AB (ref 78.0–100.0)
Platelets: 131 10*3/uL — ABNORMAL LOW (ref 150–400)
RBC: 3.32 MIL/uL — AB (ref 4.22–5.81)
RDW: 16.2 % — ABNORMAL HIGH (ref 11.5–15.5)
WBC: 6.4 10*3/uL (ref 4.0–10.5)

## 2015-09-21 LAB — APTT: aPTT: 41 seconds — ABNORMAL HIGH (ref 24–37)

## 2015-09-21 MED ORDER — LIDOCAINE HCL 1 % IJ SOLN
INTRAMUSCULAR | Status: AC
  Administered 2015-09-21: 10 mL
  Filled 2015-09-21: qty 20

## 2015-09-21 MED ORDER — MIDAZOLAM HCL 2 MG/2ML IJ SOLN
INTRAMUSCULAR | Status: AC | PRN
  Administered 2015-09-21: 1 mg via INTRAVENOUS

## 2015-09-21 MED ORDER — IOPAMIDOL (ISOVUE-300) INJECTION 61%
INTRAVENOUS | Status: AC
  Administered 2015-09-21: 60 mL
  Filled 2015-09-21: qty 100

## 2015-09-21 MED ORDER — FENTANYL CITRATE (PF) 100 MCG/2ML IJ SOLN
INTRAMUSCULAR | Status: AC | PRN
  Administered 2015-09-21: 50 ug via INTRAVENOUS

## 2015-09-21 MED ORDER — HEPARIN SOD (PORK) LOCK FLUSH 100 UNIT/ML IV SOLN
500.0000 [IU] | Freq: Once | INTRAVENOUS | Status: AC
  Administered 2015-09-21: 500 [IU] via INTRAVENOUS

## 2015-09-21 MED ORDER — HYDROCODONE-ACETAMINOPHEN 5-325 MG PO TABS: 1.0000 | ORAL_TABLET | ORAL | Status: DC | PRN

## 2015-09-21 MED ORDER — SODIUM CHLORIDE 0.9 % IV SOLN: Freq: Once | INTRAVENOUS | Status: DC

## 2015-09-21 MED ORDER — MIDAZOLAM HCL 2 MG/2ML IJ SOLN
INTRAMUSCULAR | Status: AC
  Filled 2015-09-21: qty 2

## 2015-09-21 MED ORDER — FENTANYL CITRATE (PF) 100 MCG/2ML IJ SOLN
INTRAMUSCULAR | Status: AC
  Filled 2015-09-21: qty 2

## 2015-09-21 NOTE — Sedation Documentation (Signed)
Patient is resting comfortably. 

## 2015-09-21 NOTE — Procedures (Signed)
Successful removal of [place] IVC filter.  No significant clot in IVC or filter.  Minimal blood loss.  No immediate complication.

## 2015-09-21 NOTE — Sedation Documentation (Signed)
Vital signs stable. 

## 2015-09-21 NOTE — Progress Notes (Signed)
done

## 2015-09-21 NOTE — H&P (Signed)
Chief Complaint: Requests removal of IVC Filter  Referring Physician(s): Dr. Marylu Lund  Supervising Physician: Markus Daft  Patient Status: Out-pt  History of Present Illness: Harry Franklin is a 75 y.o. male who was diagnosed with (B) LE DVTs back in March of this year.  He then had a cardiopulmonary arrest at [facility] secondary to a presumed PE.   He was transferred to [facility] He was started on heparin and trying to start coumadin, however his Hgb continued to fall as did his platelets which were 45 at the time.   Hematology felt the patient would need an IVC filter so he could be off anticoagulation and allow his labs to improve with the hope of getting him back on coumadin as an outpatient.   He had an IVC filter placed by Dr. Anselm Pancoast on 08/08/2015.  He is now on Xarelto and is doing well. He has NOT held his Xarelto for this procedure. Dr. Anselm Pancoast is aware.  He is requesting removal of the IVC filter.  He states he has "seen too many commercials" and he is" afraid to leave it in if he doesn't need it"   Past Medical History  Diagnosis Date  . GERD (gastroesophageal reflux disease)   . Hypokalemia   . Hypertension   . Cataract   . Arthritis   . Macular degeneration, age related   . DVT (deep venous thrombosis) ([place])     developed after traveling x 1, developed another after left foot surgery  . Hydrocele in adult   . 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 Replacement total knee    . Appendectomy    . Hammer toe surgery Left   . Lapidus procedure    . Hallux fusion    . Colonoscopy  03/31/05  . Colonoscopy N/A 10/02/2014    [id number] diverticulosis  . Esophagogastroduodenoscopy N/A 10/02/2014    [id number] ulcerative reflux/s/p dilation/large HH  . Maloney dilation N/A 10/02/2014    Procedure: Venia Minks DILATION;  Surgeon: Daneil Dolin, MD;  [facility];  Service:  Endoscopy;  Laterality: N/A;  . Esophagogastroduodenoscopy N/A 01/29/2015    Procedure: ESOPHAGOGASTRODUODENOSCOPY (EGD);  Surgeon: Daneil Dolin, MD;  Location[facility];  Service: Endoscopy;  Laterality: N/A;  0800-rescheduled to 9/9 @ 915 Candy notified pt  . Esophageal dilation  01/29/2015    Procedure: ESOPHAGEAL DILATION;  Surgeon: Daneil Dolin, MD;  Location[facility];  Service: Endoscopy;;  . Portacath placement Left 04/13/15    Allergies: Neulasta; Rabeprazole; and Vancomycin  Medications: Prior to Admission medications   Medication Sig Start Date End Date Taking? Authorizing Provider  atorvastatin (LIPITOR) 80 MG tablet Take 1 tablet (80 mg total) by mouth daily at 6 PM. 06/25/15  Yes Lendon Colonel, NP  Calcium Carb-Cholecalciferol (CALCIUM 500 + D3) 500-600 MG-UNIT TABS Take 2 tablets by mouth daily.   Yes Historical Provider, MD  carvedilol (COREG) 12.5 MG tablet Take 1 tablet (12.5 mg total) by mouth 2 (two) times daily. 08/26/15  Yes Arnoldo Lenis, MD  furosemide (LASIX) 40 MG tablet Take 1 tablet (40 mg total) by mouth daily. 08/09/15  Yes Donne Hazel, MD  lisinopril (PRINIVIL,ZESTRIL) 2.5 MG tablet Take 1 tablet (2.5 mg total) by mouth daily. 08/26/15  Yes Arnoldo Lenis, MD  Multiple Vitamins-Minerals (PRESERVISION AREDS 2 PO) Take 1 tablet by mouth 2 (two) times daily.  Yes Historical Provider, MD  omeprazole (PRILOSEC) 40 MG capsule Take 40 mg by mouth daily.   Yes Historical Provider, MD  potassium chloride 20 MEQ TBCR Take 20 mEq by mouth daily. 08/09/15  Yes Donne Hazel, MD  rivaroxaban (XARELTO) 20 MG TABS tablet Take 1 tablet (20 mg total) by mouth daily with supper. 09/15/15  Yes Baird Cancer, PA-C  acetaminophen (TYLENOL) 325 MG tablet Take 650 mg by mouth every 6 (six) hours as needed for mild pain or fever. Reported on 08/12/2015    Historical Provider, MD     Family History  Problem Relation Age of Onset  . Colon cancer Maternal Uncle     . Heart disease Father     Social History   Social History  . Marital Status: Married    Spouse Name: N/A  . Number of Children: N/A  . Years of Education: N/A   Social History Main Topics  . Smoking status: Former Smoker -- 1.00 packs/day for 31 years    Types: Cigarettes    Quit date: 01/22/1984  . Smokeless tobacco: Never Used     Comment: qUIT IN 1985  . Alcohol Use: No  . Drug Use: No  . Sexual Activity: Not Asked   Other Topics Concern  . None   Social History Narrative     Review of Systems: A 12 point ROS discussed  Review of Systems  Constitutional: Negative.   HENT: Negative.   Respiratory: Negative.   Cardiovascular: Negative.   Gastrointestinal: Negative.   Musculoskeletal: Negative.   Neurological: Negative.   Hematological: Negative.   Psychiatric/Behavioral: Negative.     Vital Signs: BP 136/77 mmHg  Temp(Src) 98.3 F (36.8 C) (Oral)  Resp 17  Ht 6\' 4"  (1.93 m)  Wt 262 lb (118.842 kg)  BMI 31.90 kg/m2  SpO2 98%  Physical Exam  Constitutional: He is oriented to person, place, and time. He appears well-developed and well-nourished.  HENT:  Head: Atraumatic.  Eyes: EOM are normal.  Neck: Normal range of motion. Neck supple.  Cardiovascular: Normal rate, regular rhythm and normal heart sounds.   No murmur heard. Pulmonary/Chest: Effort normal and breath sounds normal. No respiratory distress.  Abdominal: Soft. Bowel sounds are normal. He exhibits no distension.  Musculoskeletal: Normal range of motion.  Neurological: He is alert and oriented to person, place, and time.  Skin: Skin is warm and dry.  Psychiatric: He has a normal mood and affect. His behavior is normal. Judgment and thought content normal.  Vitals reviewed.   Mallampati Score:  MD Evaluation Airway: WNL Heart: WNL Abdomen: WNL Chest/ Lungs: WNL ASA  Classification: 3 Mallampati/Airway Score: One  Imaging: No results found.  Labs:  CBC:  Recent Labs   09/02/15 1057 09/07/15 1056 09/10/15 0759 09/17/15 1045  WBC 6.2 5.2 6.6 6.3  HGB 12.4* 12.1* 12.1* 12.1*  HCT 37.8* 36.7* 36.0* 36.3*  PLT 177 158 154 156    COAGS:  Recent Labs  04/08/15 1137 08/02/15 2211 08/06/15 0419 08/07/15 0432 08/08/15 0500  INR 1.19 1.51* 1.31 1.33 1.34  APTT 33 106*  --   --   --     BMP:  Recent Labs  08/06/15 0419 08/16/15 1037 08/23/15 0813 09/10/15 0759  NA 139 137 140 139  K 3.6 4.1 3.9 4.1  CL 102 101 107 103  CO2 28 29 26 27   GLUCOSE 89 74 122* 130*  BUN 22* 26* 23* 25*  CALCIUM 7.7* 8.6* 7.9* 9.0  CREATININE 1.29* 1.36* 1.15 1.35*  GFRNONAA 54* 50* >60 51*  GFRAA >60 58* >60 59*    LIVER FUNCTION TESTS:  Recent Labs  08/02/15 1039 08/16/15 1037 08/23/15 0813 09/10/15 0759  BILITOT 1.2 1.2 0.8 1.0  AST 48* 28 38 27  ALT 44 24 26 21   ALKPHOS 172* 120 91 99  PROT 6.4* 6.8 6.4* 7.1  ALBUMIN 3.3* 3.4* 3.1* 3.3*    TUMOR MARKERS: No results for input(s): AFPTM, CEA, CA199, CHROMGRNA in the last 8760 hours.  Assessment and Plan:  DVT/PE = on Xarelto  Anemia/Thrombocytopenia = resolved.  Will proceed with removal of the IVC filter today per patients request.  Risks and Benefits discussed with the patient including, but not limited to bleeding, infection, contrast induced renal failure, filter fracture or migration which can lead to emergency surgery or even death, strut penetration with damage or irritation to adjacent structures and caval thrombosis.  All of the patient's questions were answered, patient is agreeable to proceed. Consent signed and in chart.  Thank you for this interesting consult.  I greatly enjoyed meeting EPIC NOLAND and look forward to participating in their care.  A copy of this report was sent to the requesting provider on this date.  Electronically Signed: Murrell Redden PA-C 09/21/2015, 8:15 AM   I spent a total of  25 Minutes in face to face in clinical consultation, greater than  50% of which was counseling/coordinating care for removal of IVC filter.

## 2015-09-21 NOTE — Sedation Documentation (Signed)
Patient is resting comfortably. Vitals stable. No complaints at this time.

## 2015-09-21 NOTE — Sedation Documentation (Signed)
Patient is resting comfortably. Vitals stable. 

## 2015-09-21 NOTE — Sedation Documentation (Signed)
Patient is resting comfortably. Pt tolerated procedure well

## 2015-09-21 NOTE — Sedation Documentation (Signed)
Patient is resting comfortably. Vitals stable, pt alert. No complaints at this time.

## 2015-09-21 NOTE — Discharge Instructions (Signed)
°  Call your doctor for any signs or symptoms of infection: redness, swelling, warmth, oozing pus from neck site, or fever >101.

## 2015-09-21 NOTE — Sedation Documentation (Signed)
Patient is resting comfortably. Vitals stable, no complaints from pt at this time.

## 2015-09-22 ENCOUNTER — Other Ambulatory Visit (HOSPITAL_COMMUNITY): Payer: Medicare HMO

## 2015-09-22 ENCOUNTER — Ambulatory Visit (HOSPITAL_COMMUNITY): Payer: Medicare HMO

## 2015-09-24 ENCOUNTER — Other Ambulatory Visit (HOSPITAL_COMMUNITY): Payer: Medicare HMO

## 2015-09-24 ENCOUNTER — Encounter (HOSPITAL_COMMUNITY): Payer: Self-pay | Admitting: Hematology & Oncology

## 2015-09-25 ENCOUNTER — Encounter (HOSPITAL_COMMUNITY): Payer: Self-pay | Admitting: Hematology & Oncology

## 2015-09-28 ENCOUNTER — Encounter (HOSPITAL_COMMUNITY): Payer: Medicare HMO

## 2015-09-28 ENCOUNTER — Encounter (HOSPITAL_COMMUNITY): Payer: Medicare HMO | Attending: Hematology & Oncology

## 2015-09-28 DIAGNOSIS — C7910 Secondary malignant neoplasm of unspecified urinary organs: Secondary | ICD-10-CM | POA: Insufficient documentation

## 2015-09-28 LAB — CBC WITH DIFFERENTIAL/PLATELET
BASOS ABS: 0 10*3/uL (ref 0.0–0.1)
Basophils Relative: 0 %
Eosinophils Absolute: 0.4 10*3/uL (ref 0.0–0.7)
Eosinophils Relative: 6 %
HEMATOCRIT: 36.8 % — AB (ref 39.0–52.0)
Hemoglobin: 12 g/dL — ABNORMAL LOW (ref 13.0–17.0)
LYMPHS PCT: 23 %
Lymphs Abs: 1.5 10*3/uL (ref 0.7–4.0)
MCH: 33.1 pg (ref 26.0–34.0)
MCHC: 32.6 g/dL (ref 30.0–36.0)
MCV: 101.4 fL — AB (ref 78.0–100.0)
MONO ABS: 0.5 10*3/uL (ref 0.1–1.0)
Monocytes Relative: 7 %
NEUTROS ABS: 4 10*3/uL (ref 1.7–7.7)
Neutrophils Relative %: 64 %
Platelets: 124 10*3/uL — ABNORMAL LOW (ref 150–400)
RBC: 3.63 MIL/uL — ABNORMAL LOW (ref 4.22–5.81)
RDW: 15.5 % (ref 11.5–15.5)
WBC: 6.3 10*3/uL (ref 4.0–10.5)

## 2015-09-28 NOTE — Progress Notes (Signed)
Patient didn't need injection r/t lab results and treatment parameters.

## 2015-10-06 ENCOUNTER — Ambulatory Visit (INDEPENDENT_AMBULATORY_CARE_PROVIDER_SITE_OTHER): Payer: Medicare HMO | Admitting: Cardiology

## 2015-10-06 ENCOUNTER — Encounter: Payer: Self-pay | Admitting: Cardiology

## 2015-10-06 VITALS — BP 130/66 | HR 86 | Ht 76.0 in | Wt 262.0 lb

## 2015-10-06 DIAGNOSIS — I480 Paroxysmal atrial fibrillation: Secondary | ICD-10-CM | POA: Diagnosis not present

## 2015-10-06 DIAGNOSIS — I2510 Atherosclerotic heart disease of native coronary artery without angina pectoris: Secondary | ICD-10-CM | POA: Diagnosis not present

## 2015-10-06 DIAGNOSIS — I5022 Chronic systolic (congestive) heart failure: Secondary | ICD-10-CM | POA: Diagnosis not present

## 2015-10-06 MED ORDER — CARVEDILOL 25 MG PO TABS: 25.0000 mg | ORAL_TABLET | Freq: Two times a day (BID) | ORAL | Status: DC

## 2015-10-06 MED ORDER — FUROSEMIDE 40 MG PO TABS: ORAL_TABLET | ORAL | Status: DC

## 2015-10-06 MED ORDER — POTASSIUM CHLORIDE ER 8 MEQ PO TBCR: 8.0000 meq | EXTENDED_RELEASE_TABLET | Freq: Two times a day (BID) | ORAL | Status: DC

## 2015-10-06 NOTE — Progress Notes (Signed)
Patient ID: Harry Franklin, male   DOB: March 05, 1941, 75 y.o.   MRN: [id number]     Clinical Summary Harry Franklin is a 75 y.o.male seen today for follow up of the following medical problems.   1. CAD - admit Jan 2017 with NSTEMI with peak trop 8.68, no EKG changes. Echo with LVEF 40-45% with WMAs primarily involving the apex. No cardiopulmonary symptoms. 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 Given his diffusely metastatic bladder cancer and severe pancytopenia due to recent chemotherapy he was managed medically.  - follow up [facility] nuclear stres test on 07/06/15 that showed prior myocardial infarction with minimal peri-infarct ischemia in the apex and in the septal/inferoseptal/inferior wall territories  - denies any chest pain. Stable SOB/DOE.   2. Afib - new diagnosis during recent admit, was not anticoagulated at that time due to severe pancytopenia and metastatic CA.  - recently blood counts have normalized off chemo, with recent diagnosis of DVT/PE as well he has been started on xarelto  - denies any palpitations. No bleeding issues on xarelto   3. 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 Has upcoming CT scan later this month  4. DVT/Presumed PE - recent admit with SOB. Patient with PEA arrest. Found to have DVT, presumed possible PE as etiology of arrest.  - IVC filter placed 07/2015, removed 09/2015 - remains on xarelto   5. Chronic sysotlic HF - [id number] LVEF down to 25-30%. Had been 40-45% by echo 06/2015 - diuresed 7 liters during recent admission with 13 lbs weight loss. Discharge weight 267 lbs.   - denies any LE edema. Weight is up and down around 260 lbs. Taking lasix 60mg  daily. - working to limit salt intake.  - compliant with meds    Past Medical History  Diagnosis Date  . GERD (gastroesophageal reflux disease)   . Hypokalemia   . Hypertension   . Cataract   . Arthritis   . Macular degeneration, age related   . DVT (deep venous  thrombosis) ([place])     developed after traveling x 1, developed another after left foot surgery  . Hydrocele in adult   . 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 Bone metastases ([place]) 09/21/2015     Allergies  Allergen Reactions  . Neulasta [Pegfilgrastim] Hives    Pt reports breaking out in hives less than 24 hours after taking neulasta. Hives appear on arms and legs.  . Rabeprazole     Other reaction(s): DIARRHEA  . Vancomycin Hives and Rash     Current Outpatient Prescriptions  Medication Sig Dispense Refill  . acetaminophen (TYLENOL) 325 MG tablet Take 650 mg by mouth every 6 (six) hours as needed for mild pain or fever. Reported on 08/12/2015    . atorvastatin (LIPITOR) 80 MG tablet Take 1 tablet (80 mg total) by mouth daily at 6 PM. 90 tablet 3  . Calcium Carb-Cholecalciferol (CALCIUM 500 + D3) 500-600 MG-UNIT TABS Take 2 tablets by mouth daily.    . carvedilol (COREG) 12.5 MG tablet Take 1 tablet (12.5 mg total) by mouth 2 (two) times daily. 180 tablet 3  . furosemide (LASIX) 40 MG tablet Take 1 tablet (40 mg total) by mouth daily. 30 tablet 0  . lisinopril (PRINIVIL,ZESTRIL) 2.5 MG tablet Take 1 tablet (2.5 mg total) by mouth daily. 90 tablet 3  . Multiple Vitamins-Minerals (PRESERVISION AREDS 2 PO) Take 1 tablet by mouth 2 (two) times daily.    Marland Kitchen omeprazole (PRILOSEC)  40 MG capsule Take 40 mg by mouth daily.    . potassium chloride 20 MEQ TBCR Take 20 mEq by mouth daily. 30 tablet 0  . rivaroxaban (XARELTO) 20 MG TABS tablet Take 1 tablet (20 mg total) by mouth daily with supper. 30 tablet 3   No current facility-administered medications for this visit.     Past Surgical History  Procedure Laterality Date  . Replacement total knee    . Appendectomy    . Hammer toe surgery Left   . Lapidus procedure    . Hallux fusion    . Colonoscopy  03/31/05  . Colonoscopy N/A 10/02/2014    [id number] diverticulosis  . Esophagogastroduodenoscopy N/A 10/02/2014     [id number] ulcerative reflux/s/p dilation/large HH  . Maloney dilation N/A 10/02/2014    Procedure: Venia Minks DILATION;  Surgeon: Daneil Dolin, MD;  [facility];  Service: Endoscopy;  Laterality: N/A;  . Esophagogastroduodenoscopy N/A 01/29/2015    Procedure: ESOPHAGOGASTRODUODENOSCOPY (EGD);  Surgeon: Daneil Dolin, MD;  [facility];  Service: Endoscopy;  Laterality: N/A;  0800-rescheduled to 9/9 @ 915 Candy notified pt  . Esophageal dilation  01/29/2015    Procedure: ESOPHAGEAL DILATION;  Surgeon: Daneil Dolin, MD;  [facility];  Service: Endoscopy;;  . Portacath placement Left 04/13/15     Allergies  Allergen Reactions  . Neulasta [Pegfilgrastim] Hives    Pt reports breaking out in hives less than 24 hours after taking neulasta. Hives appear on arms and legs.  . Rabeprazole     Other reaction(s): DIARRHEA  . Vancomycin Hives and Rash      Family History  Problem Relation Age of Onset  . Colon cancer Maternal Uncle   . Heart disease Father      Social History Mr. Lavigna reports that he quit smoking about 31 years ago. His smoking use included Cigarettes. He has a 31 pack-year smoking history. He has never used smokeless tobacco. Mr. Sotto reports that he does not drink alcohol.   Review of Systems CONSTITUTIONAL: No weight loss, fever, chills, weakness or fatigue.  HEENT: Eyes: No visual loss, blurred vision, double vision or yellow sclerae.No hearing loss, sneezing, congestion, runny nose or sore throat.  SKIN: No rash or itching.  CARDIOVASCULAR: per HPI RESPIRATORY: No shortness of breath, cough or sputum.  GASTROINTESTINAL: No anorexia, nausea, vomiting or diarrhea. No abdominal pain or blood.  GENITOURINARY: No burning on urination, no polyuria NEUROLOGICAL: No headache, dizziness, syncope, paralysis, ataxia, numbness or tingling in the extremities. No change in bowel or bladder control.  MUSCULOSKELETAL: No muscle, back pain,  joint pain or stiffness.  LYMPHATICS: No enlarged nodes. No history of splenectomy.  PSYCHIATRIC: No history of depression or anxiety.  ENDOCRINOLOGIC: No reports of sweating, cold or heat intolerance. No polyuria or polydipsia.  Marland Kitchen   Physical Examination Filed Vitals:   10/06/15 1259  BP: 130/66  Pulse: 86   Filed Vitals:   10/06/15 1259  Height: 6\' 4"  (1.93 m)  Weight: 262 lb (118.842 kg)    Gen: resting comfortably, no acute distress HEENT: no scleral icterus, pupils equal round and reactive, no palptable cervical adenopathy,  CV: RRR, no m/r/g, no jvd Resp: Clear to auscultation bilaterally GI: abdomen is soft, non-tender, non-distended, normal bowel sounds, no hepatosplenomegaly MSK: extremities are warm, 1+ bilateral LE edema Skin: warm, no rash Neuro:  no focal deficits Psych: appropriate affect   Diagnostic Studies 06/2015 echo - Left ventricle: The cavity size  was at the upper limits of normal. Wall thickness was at the upper limits of normal. Systolic function was mildly to moderately reduced. The estimated ejection fraction was in the range of 40% to 45%. There is severe hypokinesis of the mid-apicalanteroseptal, anterior, inferior, and apical myocardium. Features are consistent with a pseudonormal left ventricular filling pattern, with concomitant abnormal relaxation and increased filling pressure (grade 2 diastolic dysfunction). - Aortic valve: Mildly calcified annulus. Trileaflet; mildly calcified leaflets. - Mitral valve: There was mild regurgitation. - Left atrium: The atrium was mildly dilated. - Right atrium: Central venous pressure (est): 3 mm Hg. - Tricuspid valve: There was trivial regurgitation. - Pulmonary arteries: PA peak pressure: 19 mm Hg (S). - Pericardium, extracardiac: There was no pericardial effusion.  Impressions:  - Upper normal LV wall thickness and chamber size with LVEF approximately 40-45%. No significant  improvement noted in wall motion abnormalities described above in comparison to the recent study. Grade 2 diastolic dysfunction with increased filling pressures. Mild left atrial enlargement. Mild mitral regurgitation. Sclerotic aortic valve without stenosis. Trivial tricuspid regurgitation with PASP 19 mmHg.   Jan 2017 echo Study Conclusions  - Left ventricle: Systolic function was mildly to moderately reduced. The estimated ejection fraction was in the range of 40% to 45%. - Regional wall motion abnormality: Hypokinesis of the mid-apical anterior, basal-mid anteroseptal, mid anterolateral, apical septal, apical lateral, and apical myocardium. - Limited study with echocontrast to evaluate LV function and wall motion.  06/2015 Lexiscan MPI  There was no ST segment deviation noted during stress.  Findings consistent with prior myocardial infarction with peri-infarct ischemia in the apex and in the septal/inferoseptal/inferior wall territories. Both defects are primarily scar with fairly mild peri-infarct ischemia.  This is a high risk study. High risk based on low ejection fraction and multiple defects. There is a fairly minimal amount of myocardium currently at jeopardy.  The left ventricular ejection fraction is moderately decreased (30-44%).    07/2015 LE [place] Summary:  - Findings consistent with acute deep vein thrombosis involving the  right lower extremity. - Findings consistent with acute deep vein thrombosis involving the  left lower extremity. - No evidence of Baker&'s cyst on the right or left.  07/2015 echo  Study Conclusions  - Left ventricle: The cavity size was normal. There was mild  concentric hypertrophy with moderate focal basal hypertrophy.  Systolic function was severely reduced. The estimated ejection  fraction was in the range of 25% to 30%. There is akinesis of the  mid-apicalanteroseptal and inferoseptal myocardium. The  study is  not technically sufficient to allow evaluation of LV diastolic  function. - Aortic valve: Trileaflet; mildly thickened, mildly calcified  leaflets. - Left atrium: The atrium was mildly dilated.      Assessment and Plan   1. CAD - continue medical therapy. We have not pursued cath due to his metastatic bladder cancer and recent pancytopenia on chemotherapy. - pending progress of his cancer and his blood counts consider cath in the future.  - continue current meds  2. Afib - no recent symptoms, continue current meds including xarelto. CHADS2Vasc score of 4  3. DVT/PE - management per heme/onc   4. Chronic systolic HF - mild edema, no significant symptoms. Counseled ok to take additional diuretic as needed.  - we will refer to cardiac rehab - we will increase coreg to 25mg  bid   F/u 1 month       Arnoldo Lenis, M.D.

## 2015-10-06 NOTE — Patient Instructions (Addendum)
Medication Instructions:  INCREASE COREG TO 25 MG TWO TIMES DAILY TAKE LASIX 60 MG (1 1/2 TABLETS) DAILY - FOR WEIGHTS GREATER THAN 263 YOU MAY TAKE AN EXTRA 40 MG   Labwork: NONE  Testing/Procedures: NONE  Follow-Up: Your physician recommends that you schedule a follow-up appointment in: 1 MONTH    Any Other Special Instructions Will Be Listed Below (If Applicable).  YOU HAVE BEEN REFERRED TO CARDIAC REHAB, SOMEONE WILL CALL YOU FROM THERE SOON.    If you need a refill on your cardiac medications before your next appointment, please call your pharmacy.

## 2015-10-08 ENCOUNTER — Encounter (HOSPITAL_COMMUNITY): Payer: Medicare HMO

## 2015-10-08 DIAGNOSIS — C7910 Secondary malignant neoplasm of unspecified urinary organs: Secondary | ICD-10-CM | POA: Diagnosis not present

## 2015-10-08 LAB — COMPREHENSIVE METABOLIC PANEL
ALK PHOS: 99 U/L (ref 38–126)
ALT: 21 U/L (ref 17–63)
AST: 27 U/L (ref 15–41)
Albumin: 3.1 g/dL — ABNORMAL LOW (ref 3.5–5.0)
Anion gap: 6 (ref 5–15)
BILIRUBIN TOTAL: 0.7 mg/dL (ref 0.3–1.2)
BUN: 27 mg/dL — ABNORMAL HIGH (ref 6–20)
CHLORIDE: 102 mmol/L (ref 101–111)
CO2: 28 mmol/L (ref 22–32)
CREATININE: 1.39 mg/dL — AB (ref 0.61–1.24)
Calcium: 8.1 mg/dL — ABNORMAL LOW (ref 8.9–10.3)
GFR, EST AFRICAN AMERICAN: 57 mL/min — AB (ref >60)
GFR, EST NON AFRICAN AMERICAN: 49 mL/min — AB (ref >60)
Glucose, Bld: 117 mg/dL — ABNORMAL HIGH (ref 65–99)
Potassium: 4 mmol/L (ref 3.5–5.1)
Sodium: 136 mmol/L (ref 135–145)
TOTAL PROTEIN: 6.8 g/dL (ref 6.5–8.1)

## 2015-10-08 NOTE — Progress Notes (Signed)
Injection held today d/t lab values. Verbal order to hold xgeva by Darcella Gasman, PA. PT verbalized understanding of taking his calcium as prescribed and will return as scheduled for follow-up.

## 2015-10-11 ENCOUNTER — Ambulatory Visit (HOSPITAL_COMMUNITY)
Admission: RE | Admit: 2015-10-11 | Discharge: 2015-10-11 | Disposition: A | Discharge status: Home / Self Care | Payer: Medicare HMO | Source: Ambulatory Visit | Attending: Hematology & Oncology | Admitting: Hematology & Oncology

## 2015-10-11 ENCOUNTER — Other Ambulatory Visit (HOSPITAL_COMMUNITY): Payer: Medicare HMO

## 2015-10-11 ENCOUNTER — Other Ambulatory Visit (HOSPITAL_COMMUNITY): Payer: Self-pay | Admitting: Oncology

## 2015-10-11 ENCOUNTER — Other Ambulatory Visit (HOSPITAL_COMMUNITY): Payer: Self-pay | Admitting: *Deleted

## 2015-10-11 DIAGNOSIS — C7910 Secondary malignant neoplasm of unspecified urinary organs: Secondary | ICD-10-CM | POA: Insufficient documentation

## 2015-10-11 DIAGNOSIS — C787 Secondary malignant neoplasm of liver and intrahepatic bile duct: Secondary | ICD-10-CM | POA: Insufficient documentation

## 2015-10-11 MED ORDER — PREDNISONE 20 MG PO TABS: ORAL_TABLET | ORAL | Status: DC

## 2015-10-11 MED ORDER — IOPAMIDOL (ISOVUE-300) INJECTION 61%
100.0000 mL | Freq: Once | INTRAVENOUS | Status: AC | PRN
  Administered 2015-10-11: 100 mL via INTRAVENOUS

## 2015-10-11 NOTE — Patient Instructions (Addendum)
[place]   IMMUNOTHERAPY  INSTRUCTIONS  What is the most important information I should know about KEYTRUDA? KEYTRUDA is a medicine that may treat certain cancers by working with your immune system. KEYTRUDA can cause your immune system to attack normal organs and tissues in many areas of your body and can affect the way they work. These problems can sometimes become serious or life-threatening and can lead to death. Call or see your doctor right away if you develop any symptoms of the following problems or these symptoms get worse: Lung problems (pneumonitis). Symptoms of pneumonitis may include: .?shortness of breath .?chest pain .?new or worse cough Intestinal problems (colitis) that can lead to tears or holes in your intestine. Signs and symptoms of colitis may include: .?diarrhea or more bowel movements than usual .?stools that are black, tarry, sticky, or have blood or mucus .?severe stomach-area (abdomen) pain or tenderness Liver problems (hepatitis). Signs and symptoms of hepatitis may include: .?yellowing of your skin or the whites of your eyes .?nausea or vomiting .?pain on the right side of your stomach area (abdomen) .?dark urine .?feeling less hungry than usual .?bleeding or bruising more easily than normal Hormone gland problems (especially the thyroid, pituitary, adrenal glands, and pancreas). Signs and symptoms that your hormone glands are not working properly may include: .?rapid heart beat .?weight loss or weight gain .?increased sweating .?feeling more hungry or thirsty .?urinating more often than usual .?hair loss .?feeling cold .?constipation .?your voice gets deeper .?muscle aches .?dizziness or fainting .?headaches that will not go away or unusual headache Kidney problems, including nephritis and kidney failure. Signs of kidney problems may include: .?change in the amount or color of your urine Problems in other  organs. Signs of these problems may include: .?rash .?changes in eyesight .?severe or persistent muscle or joint pains .?severe muscle weakness .?low red blood cells (anemia) .?shortness of breath, irregular heartbeat, feeling tired, or chest pain (myocarditis) Infusion (IV) reactions, that can sometimes be severe and life-threatening. Signs and symptoms of infusion reactions may include: .?chills or shaking .?shortness of breath or wheezing .?itching or rash .?flushing .?dizziness .?fever .?feeling like passing out Complications of stem cell transplantation that uses donor stem cells (allogeneic) after treatment with KEYTRUDA. These complications can be severe and can lead to death. Your doctor will monitor you for signs of complications if you are an allogeneic stem cell transplant recipient. Getting medical treatment right away may help keep these problems from becoming more serious. Your doctor will check you for these problems during treatment with KEYTRUDA. Your doctor may treat you with corticosteroid or hormone replacement medicines. Your doctor may also need to delay or completely stop treatment with KEYTRUDA, if you have severe side effects. What is KEYTRUDA? KEYTRUDA is a prescription medicine used to treat: .?a kind of skin cancer called melanoma. KEYTRUDA may be used when your melanoma has spread or cannot be removed by surgery (advanced melanoma). .?a kind of lung cancer called non-small cell lung cancer (NSCLC). .?KEYTRUDA may be used alone when your lung cancer: o has spread (advanced NSCLC) and, o tests positive for "PD-L1" and, -?as your first treatment if you have not received chemotherapy to treat your advanced NSCLC and your tumor does not have an abnormal "EGFR" or "ALK" gene, or -?you have received chemotherapy that contains platinum to treat your advanced NSCLC, and it did not work or it is no longer working, and -?if your tumor has an abnormal "EGFR" or  "ALK"  gene, you have also received an EGFR or ALK inhibitor medicine and it did not work or is no longer working. Marland Kitchen?KEYTRUDA may be used with the chemotherapy medicines pemetrexed and carboplatin as your first treatment when your lung cancer: o has spread (advanced NSCLC) and o is a type of lung cancer called "nonsquamous". .?a kind of cancer called head and neck squamous cell cancer (HNSCC). KEYTRUDA may be used when your HNSCC: o has returned or spread (advanced HNSCC) and o you have received chemotherapy that contains platinum to treat your advanced HNSCC, and it did not work or is no longer working. .?a kind of cancer called classical Hodgkin lymphoma (cHL). KEYTRUDA may be used for cHL in adults and children when: o you have tried a treatment and it did not work or o your cHL has returned after you received 3 or more types of treatment. .?a kind of bladder and urinary tract cancer called urothelial carcinoma. KEYTRUDA may be used when your bladder or urinary tract cancer: o has spread or cannot be removed by surgery (advanced urothelial cancer) and, o you are not able to receive chemotherapy that contains a medicine called cisplatin, or o you have received chemotherapy that contains platinum, and it did not work or is no longer working. What should I tell my doctor before receiving KEYTRUDA? Before you receive KEYTRUDA, tell your doctor if you: .?have immune system problems such as Crohn's disease, ulcerative colitis, or lupus .?have had an organ transplant .?have lung or breathing problems .?have liver problems .?have any other medical problems .?are pregnant or plan to become pregnant o KEYTRUDA can harm your unborn baby. o Females who are able to become pregnant should use an effective method of birth control during and for at least 4 months after the final dose of KEYTRUDA. Talk to your doctor about birth control methods that you can use during this time. o Tell your doctor  right away if you become pregnant during treatment with KEYTRUDA. .?are breastfeeding or plan to breastfeed. o It is not known if KEYTRUDA passes into your breast milk. o Do not breastfeed during treatment with KEYTRUDA and for 4 months after your final dose of KEYTRUDA. Tell your doctor about all the medicines you take, including prescription and over-the-counter medicines, vitamins, and herbal supplements. Know the medicines you take. Keep a list of them to show your doctor and pharmacist when you get a new medicine. How will I receive KEYTRUDA? Marland Kitchen?Your doctor will give you KEYTRUDA into your vein through an intravenous (IV) line over 30 minutes. Marland Kitchen?KEYTRUDA is usually given every 3 weeks. Marland Kitchen?Your doctor will decide how many treatments you need. Marland Kitchen?Your doctor will do blood tests to check you for side effects. Marland Kitchen?If you miss any appointments, call your doctor as soon as possible to reschedule your appointment. What are the possible side effects of KEYTRUDA? KEYTRUDA can cause serious side effects. See "What is the most important information I should know about KEYTRUDA?" Common side effects of KEYTRUDA when used alone include: feeling tired, itching, diarrhea, decreased appetite, rash, fever, cough, shortness of breath, pain in muscles, bones or joints, constipation, and nausea. In children, feeling tired, vomiting and stomach-area (abdominal) pain, and increased levels of liver enzymes and decreased levels of salt (sodium) in the blood are more common than in adults. These are not all the possible side effects of KEYTRUDA. For more information, ask your doctor or pharmacist. Tell your doctor if you have any side effect that bothers you or that  does not go away. Call your doctor for medical advice about side effects. You may report side effects to FDA at 1-800-FDA-1088. General information about the safe and effective use of KEYTRUDA Medicines are sometimes prescribed for purposes other than  those listed in a Medication Guide. If you would like more information about KEYTRUDA, talk with your doctor. You can ask your doctor or nurse for information about KEYTRUDA that is written for healthcare professionals. For more information, go to ProposalRequests.[email or web address].        SYMPTOMS TO REPORT AS SOON AS POSSIBLE AFTER TREATMENT:  FEVER GREATER THAN 100.5 F  CHILLS WITH OR WITHOUT FEVER  NAUSEA AND VOMITING THAT IS NOT CONTROLLED WITH YOUR NAUSEA MEDICATION  UNUSUAL SHORTNESS OF BREATH  UNUSUAL BRUISING OR BLEEDING  TENDERNESS IN MOUTH AND THROAT WITH OR WITHOUT PRESENCE OF ULCERS  URINARY PROBLEMS  BOWEL PROBLEMS  UNUSUAL RASH    Wear comfortable clothing and clothing appropriate for easy access to any Portacath or PICC line. Let us know if there is anything that we can do to make your therapy better!      I have been informed and understand all of the instructions given to me and have received a copy. I have been instructed to call the clinic [phone] or my family physician as soon as possible for continued medical care, if indicated. I do not have any more questions at this time but understand that I may call the [facility] or the Patient Navigator at [phone] during office hours should I have questions or need assistance in obtaining follow-up care.

## 2015-10-12 ENCOUNTER — Encounter (HOSPITAL_COMMUNITY): Payer: Medicare HMO

## 2015-10-12 ENCOUNTER — Telehealth: Payer: Self-pay | Admitting: Cardiology

## 2015-10-12 ENCOUNTER — Encounter (HOSPITAL_COMMUNITY): Payer: Self-pay | Admitting: Hematology & Oncology

## 2015-10-12 ENCOUNTER — Encounter (HOSPITAL_BASED_OUTPATIENT_CLINIC_OR_DEPARTMENT_OTHER): Payer: Medicare HMO | Admitting: Hematology & Oncology

## 2015-10-12 VITALS — BP 71/48 | HR 86 | Temp 98.4°F | Resp 20 | Wt 265.2 lb

## 2015-10-12 DIAGNOSIS — C7910 Secondary malignant neoplasm of unspecified urinary organs: Secondary | ICD-10-CM | POA: Diagnosis not present

## 2015-10-12 DIAGNOSIS — C787 Secondary malignant neoplasm of liver and intrahepatic bile duct: Secondary | ICD-10-CM | POA: Diagnosis not present

## 2015-10-12 DIAGNOSIS — C7972 Secondary malignant neoplasm of left adrenal gland: Secondary | ICD-10-CM | POA: Diagnosis not present

## 2015-10-12 DIAGNOSIS — R509 Fever, unspecified: Secondary | ICD-10-CM

## 2015-10-12 DIAGNOSIS — C649 Malignant neoplasm of unspecified kidney, except renal pelvis: Secondary | ICD-10-CM

## 2015-10-12 DIAGNOSIS — Z86711 Personal history of pulmonary embolism: Secondary | ICD-10-CM

## 2015-10-12 DIAGNOSIS — I4891 Unspecified atrial fibrillation: Secondary | ICD-10-CM

## 2015-10-12 DIAGNOSIS — C7971 Secondary malignant neoplasm of right adrenal gland: Secondary | ICD-10-CM

## 2015-10-12 DIAGNOSIS — Z86718 Personal history of other venous thrombosis and embolism: Secondary | ICD-10-CM

## 2015-10-12 LAB — URINALYSIS, ROUTINE W REFLEX MICROSCOPIC
Bilirubin Urine: NEGATIVE
Glucose, UA: NEGATIVE mg/dL
Ketones, ur: NEGATIVE mg/dL
LEUKOCYTES UA: NEGATIVE
NITRITE: NEGATIVE
PROTEIN: NEGATIVE mg/dL
SPECIFIC GRAVITY, URINE: 1.01 (ref 1.005–1.030)
pH: 5.5 (ref 5.0–8.0)

## 2015-10-12 LAB — URINE MICROSCOPIC-ADD ON
SQUAMOUS EPITHELIAL / LPF: NONE SEEN
WBC UA: NONE SEEN WBC/hpf (ref 0–5)

## 2015-10-12 MED ORDER — CARVEDILOL 12.5 MG PO TABS: 12.5000 mg | ORAL_TABLET | Freq: Two times a day (BID) | ORAL | Status: DC

## 2015-10-12 MED ORDER — HEPARIN SOD (PORK) LOCK FLUSH 100 UNIT/ML IV SOLN
INTRAVENOUS | Status: AC
  Filled 2015-10-12: qty 5

## 2015-10-12 NOTE — Telephone Encounter (Signed)
Having problems w/ his new medication changes

## 2015-10-12 NOTE — Patient Instructions (Addendum)
[place] at [facility] Discharge Instructions  RECOMMENDATIONS MADE BY THE CONSULTANT AND ANY TEST RESULTS WILL BE SENT TO YOUR REFERRING PHYSICIAN.   Return to clinic 1 week after Keytruda to see MD  Please review the [facility] teaching packet [place] gave you.  Today:  UA, blood cultures  Thank you for choosing [place] at [facility] to provide your oncology and hematology care.  To afford each patient quality time with our provider, please arrive at least 15 minutes before your scheduled appointment time.   Beginning January 23rd 2017 lab work for the [name] will be done in the  Main lab at [facility] on 1st floor. If you have a lab appointment with the [facility] please come in thru the  Main Entrance and check in at the main information desk  You need to re-schedule your appointment should you arrive 10 or more minutes late.  We strive to give you quality time with our providers, and arriving late affects you and other patients whose appointments are after yours.  Also, if you no show three or more times for appointments you may be dismissed from the clinic at the providers discretion.     Again, thank you for choosing [facility].  Our hope is that these requests will decrease the amount of time that you wait before being seen by our physicians.       _____________________________________________________________  Should you have questions after your visit to [facility], please contact our office at [phone] between the hours of 8:30 a.m. and 4:30 p.m.  Voicemails left after 4:30 p.m. will not be returned until the following business day.  For prescription refill requests, have your pharmacy contact our office.         Resources For Cancer Patients and their Caregivers ? American Cancer Society: Can assist with transportation, wigs, general needs, runs Look Good Feel Better.         570 130 7199 ? Cancer Care: Provides financial assistance, online support groups, medication/co-pay assistance.  1-800-813-HOPE 8102803056) ? [employer] Assists [place] cancer patients and their families through emotional , educational and financial support.  438-606-7533 ? [place] DSS Where to apply for food stamps, Medicaid and utility assistance. 830 748 3923 ? [employer]: Transportation to medical appointments. (778)043-3938 ? Social Security Administration: May apply for disability if have a Stage IV cancer. 360-253-7963 (234)511-1970 ? [employer], Disability and Transit Services: Assists with nutrition, care and transit needs. [place] Support Programs: @10RELATIVEDAYS @ > Cancer Support Group  2nd Tuesday of the month 1pm-2pm, Journey Room  > Creative Journey  3rd Tuesday of the month 1130am-1pm, Journey Room  > Look Good Feel Better  1st Wednesday of the month 10am-12 noon, Journey Room (Call [place] to register [phone])

## 2015-10-12 NOTE — Progress Notes (Signed)
Keytruda side effects reviewed briefly with patient. Keytruda teaching given to patient. Consent signed for Keytruda.

## 2015-10-12 NOTE — Telephone Encounter (Signed)
Pt's increased Coreg has caused pt to be "swimmy headed" as son as he started increased. Will have BP taken by Dr Whitney Muse later today will call back. He has not had to use any extra lasix

## 2015-10-12 NOTE — Progress Notes (Signed)
[facility] at [place], [place] Harry Franklin [id number]  No diagnosis found.    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   04/02/2015 Pathology Results Urine Cytology- cells present suspicious for malignancy.  Suspicious for high-grade urothelial carcinoma.   04/08/2015 Procedure US guided biopsy of hepatic lesion   04/08/2015 Pathology Results Liver, needle/core biopsy, right - METASTATIC HIGH GRADE CARCINOMA,   04/14/2015 - 07/21/2015 Chemotherapy Carboplatin/Gemcitabine Days 1, 8 every 21 days   05/25/2015 Imaging CT with interval response to therapy, improvement in multifocal liver mets, improvement in enlarged abdominal and pelvic adenopahty and bilateral adrenal gland metastatese, stable appearance of lytic lesion in L2   06/02/2015 Miscellaneous Xgeva to reduce the risk of SLE.  120 mg SQ every month.   06/08/2015 - 06/11/2015 Hospital Admission NSTEMI, Afib with RVR, Hypertension, Chemotherapy induced pancyopenia   06/28/2015 Treatment Plan Change Carboplatin dose reduced to AUC of 4 and Gemcitabine dose reduced by 20%   08/02/2015 - 08/09/2015 Hospital Admission Respiratory Arrest, acute bilateral DVT, presumed PE. IVC filter placed secondary to progressive anemia on anticoagulation   08/10/2015 Imaging CT CAP- Significant interval treatment response.   10/11/2015 Imaging CT CAP- progression of metastatic disease in the liver, with enlargement of the previously noted index lesion in segment 4B, and 2 new lesions adjacent to the gallbladder fossa, as detailed above.    10/11/2015 Progression CT scan demonstrates progression of disease.     CURRENT  THERAPY: Observation  INTERVAL HISTORY: Harry Franklin 75 y.o. male returns for followup of Metastatic, high grade urothelial cancer. He is here to review recent CT imaging.  Mr. Harry Franklin returns to the [facility] today accompanied by family. Overall, Mr. Harry Franklin notes that he feels good otherwise.  He has been dizzy lately and notes that Dr. Harl Franklin changed his medicine on the 17th; upped his Coreg to 25 mg bid on May 17th. He notes that his dizziness and weakness is  "getting worse," but didn't want to "jump the gun" when it came to stopping this increased dose.  He confirms that he's getting the Xarelto and that it costs him $141 every 4 months. He says he likes Xarelto because he can eat greens, doesn't have to worry about blood tests, and knows he will be on it for the rest of his life due to his multiple blood clots. "After the third blood clot and almost dying, I know I won't be coming off of it."  One of his male family members today notes that he's still been running a low grade fever, 100.1, 100.3 lately, "creeping up."  He still notes most of the time it is 99.  He says "other than that, everything's fine." He confirms that he's been feeling really good, other than "sometimes I feel like I'm going to heck in a handbasket." He confirms that he's eating, his appetite is "excellent."  He notes that whenever he feels bad, it's mostly because he's just tired.  A male companion says that he was having trouble moving his feet on Saturday and he remarks "I just have arthritis real bad." During the physical exam, he notes "my legs hurt real bad, especially that left one."  This is chronic and secondary to L foot drop.  No nausea or vomiting. No new pain. No drenching night sweats.    Past Medical History  Diagnosis Date  . GERD (gastroesophageal reflux disease)   . Hypokalemia   . Hypertension   . Cataract   . Arthritis   . Macular degeneration, age related   . DVT (deep  venous thrombosis) ([facility])     developed after traveling x 1, developed another after left foot surgery  . Hydrocele in adult   . 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 Bone metastases ([facility]) 09/21/2015    has Dysphagia; Encounter for screening colonoscopy; Dysphagia, pharyngoesophageal phase; Reflux esophagitis; Peptic stricture of esophagus; Hiatal hernia; Abdominal pain; Metastatic urothelial carcinoma ([facility]); Dyspnea; Elevated troponin; Atrial fibrillation with rapid ventricular response ([place]); Antineoplastic chemotherapy induced pancytopenia [facility]); NSTEMI (non-ST elevated myocardial infarction) (Harry Franklin); Recurrent falls; Acute pulmonary edema (Harry [facility]); Cardiac arrest (Harry Franklin); Respiratory arrest (Harry Franklin); AKI (acute kidney injury) (Harry Franklin); Thrombocytopenia (Harry Franklin); Absolute anemia; Acute hypoxemic respiratory failure (Harry Franklin); Malignant neoplasm of urinary bladder (Harry Franklin); Acute pulmonary embolism (Harry Franklin); and Bone metastases (Harry Franklin) on his problem list.     is allergic to neulasta; rabeprazole; and vancomycin.  Current Outpatient Prescriptions on File Prior to Visit  Medication Sig Dispense Refill  . acetaminophen (TYLENOL) 325 MG tablet Take 650 mg by mouth every 6 (six) hours as needed for mild pain or fever. Reported on 08/12/2015    . atorvastatin (LIPITOR) 80 MG tablet Take 1 tablet (80 mg total) by mouth daily at 6 PM. 90 tablet 3  . Calcium Carb-Cholecalciferol (CALCIUM 500 + D3) 500-600 MG-UNIT TABS Take 2 tablets by mouth daily.    . carvedilol (COREG) 25 MG tablet Take 1 tablet (25 mg total) by mouth 2 (two) times daily. 180 tablet 3  . furosemide (LASIX) 40 MG tablet Take 1 tablet (40 mg total) by mouth daily. (Patient taking differently: Take 60 mg by mouth daily. ) 30 tablet 0  . furosemide (LASIX) 40 MG tablet TAKE 1 1/2 TABLETS DAILY. MAY TAKE AN EXTRA 40 MG DAILY FOR WEIGHTS GREATER THAN 263. 150 tablet 3  . lisinopril (PRINIVIL,ZESTRIL) 2.5 MG tablet Take 1 tablet (2.5 mg total) by mouth  daily. 90 tablet 3  . Multiple Vitamins-Minerals (PRESERVISION AREDS 2 PO) Take 1 tablet by mouth 2 (two) times daily.    Marland Kitchen omeprazole (PRILOSEC) 40 MG capsule Take 40 mg by mouth daily.    . potassium chloride (KLOR-CON) 8 MEQ tablet Take 1 tablet (8 mEq total) by mouth 2 (two) times daily. 180 tablet 3  . rivaroxaban (XARELTO) 20 MG TABS tablet Take 1 tablet (20 mg total) by mouth daily with supper. 30 tablet 3  . predniSONE (DELTASONE) 20 MG tablet At the onset of diarrhea, take 6 tablets (168m) daily. [place] (Patient not taking: Reported on 10/12/2015) 42 tablet 0   No current facility-administered medications on file prior to visit.    Past Surgical History  Procedure Laterality Date  . Replacement total knee    . Appendectomy    . Hammer toe surgery Left   . Lapidus procedure    . Hallux fusion    . Colonoscopy  03/31/05  . Colonoscopy N/A 10/02/2014    RWCH:ENIDPOEdiverticulosis  . Esophagogastroduodenoscopy N/A 10/02/2014    RUMP:NTIRWulcerative reflux/s/p dilation/large HH  . [place]oney dilation N/A 10/02/2014    Procedure: MVenia MinksDILATION;  Surgeon: RDaneil Dolin MD;  [facility];  Service: Endoscopy;  Laterality: N/A;  . Esophagogastroduodenoscopy N/A 01/29/2015    Procedure: ESOPHAGOGASTRODUODENOSCOPY (EGD);  Surgeon: Daneil Dolin, MD;  Location: [facility];  Service: Endoscopy;  Laterality: N/A;  0800-rescheduled to 9/9 @ 915 [place] notified pt  . Esophageal dilation  01/29/2015    Procedure: ESOPHAGEAL DILATION;  Surgeon: Daneil Dolin, MD;  Location: [facility];  Service: Endoscopy;;  . Portacath placement Left 04/13/15    REVIEW OF SYSTEMS Positive for shortness of breath, dizziness and weakness Denies any headaches,  double vision, fevers, chills, night sweats, nausea, vomiting, diarrhea, constipation, Harry pain, heart palpitations, shortness of breath, blood in stool, black tarry stool, urinary pain, urinary burning, urinary frequency,  hematuria. 14 point review of systems was performed and is negative except as detailed under history of present illness and above   PHYSICAL EXAMINATION  ECOG PERFORMANCE STATUS: 0 - Asymptomatic   BP 71/48 mmHg  Pulse 86  Temp(Src) 98.4 F (36.9 C) (Oral)  Resp 20  Wt 265 lb 3.2 oz (120.294 kg)  SpO2 98%  GENERAL:alert, no distress, well nourished, well developed, comfortable, cooperative, smiling and accompanied by family. SKIN: skin color, texture, turgor are normal, no rashes or significant lesions HEAD: Normocephalic, No masses, lesions, tenderness or abnormalities EYES: normal, EOMI, Conjunctiva are pink and non-injected EARS: External ears normal OROPHARYNX:lips, buccal mucosa, and tongue normal and mucous membranes are moist  NECK: supple, no stridor, trachea midline LYMPH:  no palpable lymphadenopathy, no hepatosplenomegaly BREAST:not examined LUNGS: clear to auscultation and percussion HEART: regular rate & rhythm ABDOMEN:abdomen soft, non-tender, normal bowel sounds and no masses or organomegaly BACK: Back symmetric, no curvature. EXTREMITIES:less then 2 second capillary refill, no edema, no skin discoloration, no clubbing, no cyanosis. Right leg larger than left, chronic from prior right knee surgery. Ted hose  L foot brace NEURO: alert & oriented x 3 with fluent speech, no focal motor/sensory deficits, gait normal  LABORATORY DATA: I have reviewed the data as listed   Results for KENO, CARAWAY (MRN [id number]) as of 09/10/2015 08:42  Ref. Range 09/10/2015 07:59  Sodium Latest Ref Range: 135-145 mmol/L 139  Potassium Latest Ref Range: 3.5-5.1 mmol/L 4.1  Chloride Latest Ref Range: 101-111 mmol/L 103  CO2 Latest Ref Range: 22-32 mmol/L 27  BUN Latest Ref Range: 6-20 mg/dL 25 (H)  Creatinine Latest Ref Range: 0.61-1.24 mg/dL 1.35 (H)  Calcium Latest Ref Range: 8.9-10.3 mg/dL 9.0  EGFR (Non-African Amer.) Latest Ref Range: >60 mL/min 51 (L)  EGFR (African  American) Latest Ref Range: >60 mL/min 59 (L)  Glucose Latest Ref Range: 65-99 mg/dL 130 (H)  Anion gap Latest Ref Range: 5-15  9  Alkaline Phosphatase Latest Ref Range: 38-126 U/L 99  Albumin Latest Ref Range: 3.5-5.0 g/dL 3.3 (L)  AST Latest Ref Range: 15-41 U/L 27  ALT Latest Ref Range: 17-63 U/L 21  Total Protein Latest Ref Range: 6.5-8.1 g/dL 7.1  Total Bilirubin Latest Ref Range: 0.3-1.2 mg/dL 1.0  WBC Latest Ref Range: 4.0-10.5 K/uL 6.6  RBC Latest Ref Range: 4.22-5.81 MIL/uL 3.59 (L)  Hemoglobin Latest Ref Range: 13.0-17.0 g/dL 12.1 (L)  HCT Latest Ref Range: 39.0-52.0 % 36.0 (L)  MCV Latest Ref Range: 78.0-100.0 fL 100.3 (H)  MCH Latest Ref Range: 26.0-34.0 pg 33.7  MCHC Latest Ref Range: 30.0-36.0 g/dL 33.6  RDW Latest Ref Range: 11.5-15.5 % 17.7 (H)  Platelets Latest Ref Range: 150-400 K/uL 154  Neutrophils Latest Units: % 63  Lymphocytes Latest Units: % 19  Monocytes Relative Latest  Units: % 14  Eosinophil Latest Units: % 4  Basophil Latest Units: % 0  NEUT# Latest Ref Range: 1.7-7.7 K/uL 4.2  Lymphocyte # Latest Ref Range: 0.7-4.0 K/uL 1.3  Monocyte # Latest Ref Range: 0.1-1.0 K/uL 0.9  Eosinophils Absolute Latest Ref Range: 0.0-0.7 K/uL 0.3  Basophils Absolute Latest Ref Range: 0.0-0.1 K/uL 0.0   Results for MARIANA, GOYTIA (MRN [id number]) as of 09/10/2015 09:49  Ref. Range 08/25/2015 09:42  Vitamin B12 Latest Ref Range: 180-914 pg/mL 1379 (H)   Results for BRIANNA, ESSON (MRN [id number]) as of 09/10/2015 09:49  Ref. Range 08/25/2015 09:43  Folate Latest Ref Range: >5.9 ng/mL 25.9    RADIOGRAPHIC STUDIES:   Study Result     CLINICAL DATA: 75 year old male with history of metastatic urothelial carcinoma diagnosed in November 2016 status post chemotherapy. Restaging examination.  EXAM: CT Harry, ABDOMEN, AND PELVIS WITH CONTRAST  TECHNIQUE: Multidetector CT imaging of the Harry, abdomen and pelvis was performed following the standard protocol during  bolus administration of intravenous contrast.  CONTRAST: 135m ISOVUE-300 IOPAMIDOL (ISOVUE-300) INJECTION 61%  COMPARISON: CT the Harry, abdomen and pelvis 08/10/2015.  FINDINGS: CT Harry FINDINGS  Mediastinum/Lymph Nodes: Heart size is normal. There is no significant pericardial fluid, thickening or pericardial calcification. There is atherosclerosis of the thoracic aorta, the great vessels of the mediastinum and the coronary arteries, including calcified atherosclerotic plaque in the left main, left anterior descending, left circumflex and right coronary arteries. No pathologically enlarged mediastinal or hilar lymph nodes. Moderate-sized hiatal hernia. No axillary lymphadenopathy. Right internal jugular single-lumen porta cath with tip terminating in the right atrium.  Lungs/Pleura: Tiny 5 mm nodule in the lateral segment of the right middle lobe (image 106 of series 7) is unchanged. 6 mm subpleural nodule of the base of the left lower lobe (image 122 of series 7) is also unchanged. No other larger more suspicious appearing pulmonary nodules or masses are noted. Bilateral apical pleuroparenchymal thickening and architectural distortion is most compatible with chronic post infectious or inflammatory scarring. Mild diffuse bronchial wall thickening with mild centrilobular and paraseptal emphysema, predominantly noted in the lung apices. No acute consolidative airspace disease. Previously noted trace left pleural effusion has resolved. Previously noted trace right pleural effusion has nearly completely resolved.  Musculoskeletal/Soft Tissues: There are no aggressive appearing lytic or blastic lesions noted in the visualized portions of the skeleton. Posttraumatic deformity of the medial right clavicle again incidentally noted.  CT ABDOMEN AND PELVIS FINDINGS  Hepatobiliary: Compared to the prior study the index hepatic metastasis centered in segment 4B has  significantly increased in size, currently measuring 3.0 x 5.1 cm (image 66 of series 2) as compared with 1.9 x 1.8 cm on 08/10/2015. There are 2 new lesions adjacent to the gallbladder fossa in segment 4A (image 64 of series 2) and segment 5 (image 69 of series 2), with the largest of these in segment 5 measuring 3.3 x 2.0 cm. Well-defined 12 mm low-attenuation lesion in segment 6 of the liver is unchanged, compatible with a simple cyst. No intra or extrahepatic biliary ductal dilatation. There appears to be some amorphous potentially enhancing soft tissue within the gallbladder, most evident near the neck of the gallbladder best visualized on image 71 of series 2. Much of this appears nondependent, and is concerning for possible metastatic disease the gallbladder.  Pancreas: No pancreatic mass. No pancreatic ductal dilatation. No pancreatic or peripancreatic fluid or inflammatory changes.  Spleen: Spleen is normal in appearance.  Adrenals/Urinary Tract:  Bilateral kidneys and the right adrenal gland are normal in appearance. Lobular contour of the left adrenal gland again noted, including a 1.4 x 1.0 cm area in the mid body of the gland, which is stable compared to the prior examination. No hydroureteronephrosis. Urinary bladder is nearly decompressed, but unremarkable in appearance.  Stomach/Bowel: Normal appearance of the stomach. No pathologic dilatation of small bowel or colon. Numerous colonic diverticulae are noted, without surrounding inflammatory changes to suggest an acute diverticulitis at this time. The appendix is not confidently identified may be surgically absent. Regardless, there are no inflammatory changes noted adjacent to the cecum to suggest the presence of an acute appendicitis at this time.  Vascular/Lymphatic: Atherosclerosis throughout the abdominal and pelvic vasculature, without evidence of aneurysm or dissection. Enlarged portacaval lymph node  measuring 1.7 cm in short axis, similar to the prior study. No new lymphadenopathy noted in the abdomen or pelvis.  Reproductive: Prostate gland and seminal vesicles are unremarkable in appearance. Incidental imaging of the upper scrotum demonstrates a hydrocele (likely on the left) which is at least moderate to large in size.  Other: No significant volume of ascites. No pneumoperitoneum.  Musculoskeletal: Lytic slightly expansile lesion in the right side of the L2 vertebral body again noted, with well-defined sclerotic margins and narrow zone of transition, compatible with a treated metastatic lesion. No other new aggressive appearing lytic or blastic lesions are noted elsewhere in the visualized axial or appendicular skeleton.  IMPRESSION: 1. Today's study demonstrates progression of metastatic disease in the liver, with enlargement of the previously noted index lesion in segment 4B, and 2 new lesions adjacent to the gallbladder fossa, as detailed above. 2. In addition, there is some abnormal soft tissue within the gallbladder, which is concerning for potential metastatic disease to the gallbladder. A second primary gallbladder neoplasm is not excluded, but is not favored. This could be further evaluated with abdominal ultrasound or MRI of the abdomen with and without IV gadolinium with MRCP if clinically appropriate. 3. The previously noted mildly enlarged portacaval lymph node is unchanged. 4. Previously noted treated metastatic lesion in the right side of the L2 vertebral body is unchanged. No new osseous lesions are identified. 5. Urinary bladder is nearly decompressed, but grossly unremarkable in appearance. 6. Additional incidental findings, as above, similar prior examinations. These results will be called to the ordering clinician or representative by the Radiologist Assistant, and communication documented in the PACS or zVision Dashboard.   Electronically  Signed  By: Vinnie Langton M.D.  On: 10/11/2015 10:31     ASSESSMENT AND PLAN: Stage IV urothelial carcinoma Liver Metastases Adrenal Metastases  Harry pain/GERD History of esophageal stricture Carboplatin/Gemzar with excellent tolerance and response Anemia, treatment related NSTEMI.  Atrial fibrillation with RVR CARBO/GEMZAR dose reduction secondary to above and pancytopenia Bilateral DVT/PE, respiratory arrest Progression of disease on CT 10/11/2015  We reviewed CT imaging in detail. He has had progression of disease. We have discussed second line therapy in the past, I have recommended immunotherapy. We will start [place]. I spent time today addressing specific toxicities of therapy including pneumonitis, colitis and endocrinopathies.  I am not sure of the cause of his low grade temperatures. Will recheck blood cultures X 2 and U/A. ? Tumor fever but I think this is less likely.   He is going to follow-up with cardiology in regards to his BP, he is orthostatic today with systolic in the 31'V on standing.   We will arrange for formal chemotherapy teaching. I  anticipate starting therapy next Monday.  Orders Placed This Encounter  Procedures  . Culture, blood (routine x 2)    Standing Status: Future     Number of Occurrences: 1     Standing Expiration Date: 10/11/2016  . Culture, blood (routine x 2)    Standing Status: Future     Number of Occurrences: 1     Standing Expiration Date: 10/11/2016  . Urinalysis, Routine w reflex microscopic    Standing Status: Future     Number of Occurrences: 1     Standing Expiration Date: 10/11/2016  . Urine microscopic-add on   All questions were answered. The patient knows to call the clinic with any problems, questions or concerns. We can certainly see the patient much sooner if necessary.  This document serves as a record of services personally performed by Ancil Linsey, MD. It was created on her behalf by Toni Amend, a  trained medical scribe. The creation of this record is based on the scribe's personal observations and the provider's statements to them. This document has been checked and approved by the attending provider.  I have reviewed the above documentation for accuracy and completeness, and I agree with the above.  This note is electronically signed by: Molli Hazard, MD  10/12/2015 10:06 AM

## 2015-10-12 NOTE — Telephone Encounter (Signed)
We can decrease his coreg to 12.5mg  bid. Has he been eating and drinking ok, any vomiting or diarrhea. If so he should hold his lasix for 2 days as well. If not then just decrease coreg back down   Zandra Abts MD

## 2015-10-12 NOTE — Telephone Encounter (Signed)
Have him lower his day time coreg back to 12.5mg  daily, and keep his night dose at 25 mg and see how he feels. If continues to feel dizzy then lower night time dose back to 12.5mg  daily as well  Zandra Abts MD

## 2015-10-12 NOTE — Telephone Encounter (Signed)
Pt will decrease Coreg to 12.5 mg BID,

## 2015-10-12 NOTE — Telephone Encounter (Signed)
Pt saw Dr Ralph Dowdy VS 103/59 HR 78,standing 71/48 no HR recorded,they wrote c/o fever,diarrhea, and dizzy,pt sent home from oncology

## 2015-10-15 ENCOUNTER — Other Ambulatory Visit (HOSPITAL_COMMUNITY): Payer: Medicare HMO

## 2015-10-17 LAB — CULTURE, BLOOD (ROUTINE X 2)
Culture: NO GROWTH
Culture: NO GROWTH

## 2015-10-20 ENCOUNTER — Encounter (HOSPITAL_COMMUNITY): Payer: Medicare HMO

## 2015-10-20 ENCOUNTER — Other Ambulatory Visit (HOSPITAL_COMMUNITY): Payer: Medicare HMO

## 2015-10-20 ENCOUNTER — Ambulatory Visit (HOSPITAL_COMMUNITY): Payer: Medicare HMO

## 2015-10-20 ENCOUNTER — Encounter (HOSPITAL_BASED_OUTPATIENT_CLINIC_OR_DEPARTMENT_OTHER): Payer: Medicare HMO

## 2015-10-20 VITALS — BP 126/58 | HR 68 | Temp 98.0°F | Resp 18 | Wt 266.2 lb

## 2015-10-20 DIAGNOSIS — Z5112 Encounter for antineoplastic immunotherapy: Secondary | ICD-10-CM | POA: Diagnosis not present

## 2015-10-20 DIAGNOSIS — C649 Malignant neoplasm of unspecified kidney, except renal pelvis: Secondary | ICD-10-CM

## 2015-10-20 DIAGNOSIS — C7972 Secondary malignant neoplasm of left adrenal gland: Secondary | ICD-10-CM

## 2015-10-20 DIAGNOSIS — C787 Secondary malignant neoplasm of liver and intrahepatic bile duct: Secondary | ICD-10-CM | POA: Diagnosis not present

## 2015-10-20 DIAGNOSIS — C7971 Secondary malignant neoplasm of right adrenal gland: Secondary | ICD-10-CM | POA: Diagnosis not present

## 2015-10-20 DIAGNOSIS — C7910 Secondary malignant neoplasm of unspecified urinary organs: Secondary | ICD-10-CM | POA: Diagnosis not present

## 2015-10-20 LAB — CBC WITH DIFFERENTIAL/PLATELET
BASOS ABS: 0 10*3/uL (ref 0.0–0.1)
Basophils Relative: 0 %
EOS ABS: 0.2 10*3/uL (ref 0.0–0.7)
EOS PCT: 5 %
HCT: 31.6 % — ABNORMAL LOW (ref 39.0–52.0)
HEMOGLOBIN: 10.3 g/dL — AB (ref 13.0–17.0)
LYMPHS ABS: 1.2 10*3/uL (ref 0.7–4.0)
Lymphocytes Relative: 33 %
MCH: 32.7 pg (ref 26.0–34.0)
MCHC: 32.6 g/dL (ref 30.0–36.0)
MCV: 100.3 fL — ABNORMAL HIGH (ref 78.0–100.0)
Monocytes Absolute: 0.6 10*3/uL (ref 0.1–1.0)
Monocytes Relative: 17 %
NEUTROS PCT: 45 %
Neutro Abs: 1.6 10*3/uL — ABNORMAL LOW (ref 1.7–7.7)
PLATELETS: 170 10*3/uL (ref 150–400)
RBC: 3.15 MIL/uL — AB (ref 4.22–5.81)
RDW: 14.7 % (ref 11.5–15.5)
WBC: 3.6 10*3/uL — AB (ref 4.0–10.5)

## 2015-10-20 LAB — COMPREHENSIVE METABOLIC PANEL
ALBUMIN: 2.9 g/dL — AB (ref 3.5–5.0)
ALK PHOS: 112 U/L (ref 38–126)
ALT: 25 U/L (ref 17–63)
AST: 35 U/L (ref 15–41)
Anion gap: 5 (ref 5–15)
BUN: 23 mg/dL — AB (ref 6–20)
CALCIUM: 7.8 mg/dL — AB (ref 8.9–10.3)
CHLORIDE: 105 mmol/L (ref 101–111)
CO2: 27 mmol/L (ref 22–32)
CREATININE: 1.28 mg/dL — AB (ref 0.61–1.24)
GFR calc Af Amer: >60 mL/min (ref >60)
GFR calc non Af Amer: 54 mL/min — ABNORMAL LOW (ref >60)
GLUCOSE: 128 mg/dL — AB (ref 65–99)
Potassium: 4.1 mmol/L (ref 3.5–5.1)
SODIUM: 137 mmol/L (ref 135–145)
Total Bilirubin: 0.7 mg/dL (ref 0.3–1.2)
Total Protein: 6.1 g/dL — ABNORMAL LOW (ref 6.5–8.1)

## 2015-10-20 LAB — TSH: TSH: 1.303 u[IU]/mL (ref 0.350–4.500)

## 2015-10-20 MED ORDER — SODIUM CHLORIDE 0.9 % IV SOLN
200.0000 mg | Freq: Once | INTRAVENOUS | Status: AC
  Administered 2015-10-20: 200 mg via INTRAVENOUS
  Filled 2015-10-20: qty 8

## 2015-10-20 MED ORDER — HEPARIN SOD (PORK) LOCK FLUSH 100 UNIT/ML IV SOLN
500.0000 [IU] | Freq: Once | INTRAVENOUS | Status: AC | PRN
  Administered 2015-10-20: 500 [IU]

## 2015-10-20 MED ORDER — SODIUM CHLORIDE 0.9% FLUSH: 10.0000 mL | INTRAVENOUS | Status: DC | PRN

## 2015-10-20 MED ORDER — SODIUM CHLORIDE 0.9 % IV SOLN
Freq: Once | INTRAVENOUS | Status: AC
  Administered 2015-10-20: 15:00:00 via INTRAVENOUS

## 2015-10-20 NOTE — Progress Notes (Signed)
Patient not to get Aranesp today r/t lab results and treatment parameters.   No Xgeva today r/t low calcium levels per Robynn Pane, PA.   Patient tolerated infusion well.  VSS.

## 2015-10-20 NOTE — Patient Instructions (Signed)
[facility] Discharge Instructions for Patients Receiving Chemotherapy   Beginning January 23rd 2017 lab work for the Cancer Center will be done in the  Main lab at [facility] on 1st floor. If you have a lab appointment with the Cancer Center please come in thru the  Main Entrance and check in at the main information desk   Today you received the following chemotherapy agents:  Keytruda  If you develop nausea and vomiting, or diarrhea that is not controlled by your medication, call the clinic.  The clinic phone number is [phone]. Office hours are Monday-Friday 8:30am-5:00pm.  BELOW ARE SYMPTOMS THAT SHOULD BE REPORTED IMMEDIATELY:  *FEVER GREATER THAN 101.0 F  *CHILLS WITH OR WITHOUT FEVER  NAUSEA AND VOMITING THAT IS NOT CONTROLLED WITH YOUR NAUSEA MEDICATION  *UNUSUAL SHORTNESS OF BREATH  *UNUSUAL BRUISING OR BLEEDING  TENDERNESS IN MOUTH AND THROAT WITH OR WITHOUT PRESENCE OF ULCERS  *URINARY PROBLEMS  *BOWEL PROBLEMS  UNUSUAL RASH Items with * indicate a potential emergency and should be followed up as soon as possible. If you have an emergency after office hours please contact your primary care physician or go to the nearest emergency department.  Please call the clinic during office hours if you have any questions or concerns.   You may also contact the Patient Navigator at [phone] should you have any questions or need assistance in obtaining follow up care.      Resources For Cancer Patients and their Caregivers ? American Cancer Society: Can assist with transportation, wigs, general needs, runs Look Good Feel Better.        1-888-227-6333 ? Cancer Care: Provides financial assistance, online support groups, medication/co-pay assistance.  1-800-813-HOPE (4673) ? [facility] Assists [place] cancer patients and their families through emotional , educational and financial support.   336-427-4357 ? [place] DSS Where to apply for food stamps, Medicaid and utility assistance. 336-342-1394 ? [facility]: Transportation to medical appointments. 336-347-2287 ? Social Security Administration: May apply for disability if have a Stage IV cancer. 336-342-7796 1-800-772-1213 ? [place] Aging, Disability and Transit Services: Assists with nutrition, care and transit needs. 336-349-2343         

## 2015-10-20 NOTE — Progress Notes (Signed)
Please see other encounter for documentation.   

## 2015-10-27 ENCOUNTER — Encounter (HOSPITAL_COMMUNITY): Payer: Medicare HMO | Attending: Hematology & Oncology | Admitting: Hematology & Oncology

## 2015-10-27 ENCOUNTER — Other Ambulatory Visit (HOSPITAL_COMMUNITY): Payer: Medicare HMO

## 2015-10-27 ENCOUNTER — Encounter (HOSPITAL_COMMUNITY): Payer: Medicare HMO

## 2015-10-27 ENCOUNTER — Encounter (HOSPITAL_COMMUNITY): Payer: Self-pay | Admitting: Hematology & Oncology

## 2015-10-27 VITALS — BP 126/68 | HR 77 | Temp 97.9°F | Resp 18 | Wt 263.3 lb

## 2015-10-27 DIAGNOSIS — C7951 Secondary malignant neoplasm of bone: Secondary | ICD-10-CM | POA: Diagnosis not present

## 2015-10-27 DIAGNOSIS — C7910 Secondary malignant neoplasm of unspecified urinary organs: Secondary | ICD-10-CM | POA: Diagnosis present

## 2015-10-27 DIAGNOSIS — C7971 Secondary malignant neoplasm of right adrenal gland: Secondary | ICD-10-CM | POA: Diagnosis not present

## 2015-10-27 DIAGNOSIS — C787 Secondary malignant neoplasm of liver and intrahepatic bile duct: Secondary | ICD-10-CM

## 2015-10-27 DIAGNOSIS — C649 Malignant neoplasm of unspecified kidney, except renal pelvis: Secondary | ICD-10-CM | POA: Diagnosis not present

## 2015-10-27 DIAGNOSIS — C7972 Secondary malignant neoplasm of left adrenal gland: Secondary | ICD-10-CM

## 2015-10-27 DIAGNOSIS — D6481 Anemia due to antineoplastic chemotherapy: Secondary | ICD-10-CM

## 2015-10-27 DIAGNOSIS — Z86718 Personal history of other venous thrombosis and embolism: Secondary | ICD-10-CM

## 2015-10-27 DIAGNOSIS — I4891 Unspecified atrial fibrillation: Secondary | ICD-10-CM

## 2015-10-27 DIAGNOSIS — Z86711 Personal history of pulmonary embolism: Secondary | ICD-10-CM

## 2015-10-27 DIAGNOSIS — R05 Cough: Secondary | ICD-10-CM

## 2015-10-27 DIAGNOSIS — I749 Embolism and thrombosis of unspecified artery: Secondary | ICD-10-CM

## 2015-10-27 DIAGNOSIS — R509 Fever, unspecified: Secondary | ICD-10-CM

## 2015-10-27 LAB — COMPREHENSIVE METABOLIC PANEL
ALBUMIN: 3 g/dL — AB (ref 3.5–5.0)
ALK PHOS: 134 U/L — AB (ref 38–126)
ALT: 27 U/L (ref 17–63)
ANION GAP: 7 (ref 5–15)
AST: 34 U/L (ref 15–41)
BUN: 23 mg/dL — AB (ref 6–20)
CALCIUM: 8.6 mg/dL — AB (ref 8.9–10.3)
CO2: 26 mmol/L (ref 22–32)
CREATININE: 1.33 mg/dL — AB (ref 0.61–1.24)
Chloride: 102 mmol/L (ref 101–111)
GFR calc Af Amer: >60 mL/min (ref >60)
GFR calc non Af Amer: 52 mL/min — ABNORMAL LOW (ref >60)
GLUCOSE: 130 mg/dL — AB (ref 65–99)
Potassium: 4 mmol/L (ref 3.5–5.1)
SODIUM: 135 mmol/L (ref 135–145)
Total Bilirubin: 1.1 mg/dL (ref 0.3–1.2)
Total Protein: 6.4 g/dL — ABNORMAL LOW (ref 6.5–8.1)

## 2015-10-27 LAB — CBC WITH DIFFERENTIAL/PLATELET
BASOS ABS: 0 10*3/uL (ref 0.0–0.1)
BASOS PCT: 0 %
EOS ABS: 0.2 10*3/uL (ref 0.0–0.7)
Eosinophils Relative: 3 %
HEMATOCRIT: 34.1 % — AB (ref 39.0–52.0)
HEMOGLOBIN: 11.2 g/dL — AB (ref 13.0–17.0)
Lymphocytes Relative: 19 %
Lymphs Abs: 1 10*3/uL (ref 0.7–4.0)
MCH: 32.7 pg (ref 26.0–34.0)
MCHC: 32.8 g/dL (ref 30.0–36.0)
MCV: 99.4 fL (ref 78.0–100.0)
Monocytes Absolute: 0.6 10*3/uL (ref 0.1–1.0)
Monocytes Relative: 11 %
NEUTROS ABS: 3.6 10*3/uL (ref 1.7–7.7)
NEUTROS PCT: 67 %
Platelets: 166 10*3/uL (ref 150–400)
RBC: 3.43 MIL/uL — AB (ref 4.22–5.81)
RDW: 14.4 % (ref 11.5–15.5)
WBC: 5.5 10*3/uL (ref 4.0–10.5)

## 2015-10-27 MED ORDER — DENOSUMAB 120 MG/1.7ML ~~LOC~~ SOLN
120.0000 mg | Freq: Once | SUBCUTANEOUS | Status: AC
  Administered 2015-10-27: 120 mg via SUBCUTANEOUS
  Filled 2015-10-27: qty 1.7

## 2015-10-27 NOTE — Progress Notes (Signed)
Harry Franklin presents today for injection per MD orders. XGEVA administered SQ in left Abdomen. Administration without incident. Patient tolerated well.

## 2015-10-27 NOTE — Patient Instructions (Signed)
[place] at [facility] Discharge Instructions  RECOMMENDATIONS MADE BY THE CONSULTANT AND ANY TEST RESULTS WILL BE SENT TO YOUR REFERRING PHYSICIAN.  Return to clinic in 2 weeks for the next [place] and labs.  Thank you for choosing [place] at [facility] to provide your oncology and hematology care.  To afford each patient quality time with our provider, please arrive at least 15 minutes before your scheduled appointment time.   Beginning January 23rd 2017 lab work for the [name] will be done in the  Main lab at [facility] on 1st floor. If you have a lab appointment with the [facility] please come in thru the  Main Entrance and check in at the main information desk  You need to re-schedule your appointment should you arrive 10 or more minutes late.  We strive to give you quality time with our providers, and arriving late affects you and other patients whose appointments are after yours.  Also, if you no show three or more times for appointments you may be dismissed from the clinic at the providers discretion.     Again, thank you for choosing [facility]  Our hope is that these requests will decrease the amount of time that you wait before being seen by our physicians.       _____________________________________________________________  Should you have questions after your visit to [facility], please contact our office at [phone] between the hours of 8:30 a.m. and 4:30 p.m.  Voicemails left after 4:30 p.m. will not be returned until the following business day.  For prescription refill requests, have your pharmacy contact our office.         Resources For Cancer Patients and their Caregivers ? American Cancer Society: Can assist with transportation, wigs, general needs, runs Look Good Feel Better.        (939)676-3774 ? Cancer Care: Provides financial assistance, online support groups,  medication/co-pay assistance.  1-800-813-HOPE 779-524-3364) ? [place] Assists [place] cancer patients and their families through emotional , educational and financial support.  760-711-7822 ? [place] DSS Where to apply for food stamps, Medicaid and utility assistance. (319)331-9059 ? [facility]: Transportation to medical appointments. 9251180520 ? Social Security Administration: May apply for disability if have a Stage IV cancer. 386-192-2405 (628) 441-4478 ? [employer], Disability and Transit Services: Assists with nutrition, care and transit needs. [place] Support Programs: @10RELATIVEDAYS @ > Cancer Support Group  2nd Tuesday of the month 1pm-2pm, Journey Room  > Creative Journey  3rd Tuesday of the month 1130am-1pm, Journey Room  > Look Good Feel Better  1st Wednesday of the month 10am-12 noon, Journey Room (Call [place] to register [phone])

## 2015-10-27 NOTE — Progress Notes (Signed)
[place] at [place], MD [place] [address]  Metastatic urothelial carcinoma [facility]) - Plan: CBC with Differential    Metastatic urothelial carcinoma ([place])   04/02/2015 Imaging CT abd/pelvis- Innumberable hepatic masses, B/L adrenal mets, abdominopelvic lymphadenopathy, LLL lung lesion 6 mm, Lytic lesion of right L2 vertebral bosy, circumferential wall thickening of lower thoracic esophagus, mild diffuse bladder wall thickening.   04/02/2015 Pathology Results Urine Cytology- cells present suspicious for malignancy.  Suspicious for high-grade urothelial carcinoma.   04/08/2015 Procedure US guided biopsy of hepatic lesion   04/08/2015 Pathology Results Liver, needle/core biopsy, right - METASTATIC HIGH GRADE CARCINOMA,   04/14/2015 - 07/21/2015 Chemotherapy Carboplatin/Gemcitabine Days 1, 8 every 21 days   05/25/2015 Imaging CT with interval response to therapy, improvement in multifocal liver mets, improvement in enlarged abdominal and pelvic adenopahty and bilateral adrenal gland metastatese, stable appearance of lytic lesion in L2   06/02/2015 Miscellaneous Xgeva to reduce the risk of SLE.  120 mg SQ every month.   06/08/2015 - 06/11/2015 Hospital Admission NSTEMI, Afib with RVR, Hypertension, Chemotherapy induced pancyopenia   06/28/2015 Treatment Plan Change Carboplatin dose reduced to AUC of 4 and Gemcitabine dose reduced by 20%   08/02/2015 - 08/09/2015 Hospital Admission Respiratory Arrest, acute bilateral DVT, presumed PE. IVC filter placed secondary to progressive anemia on anticoagulation   08/10/2015 Imaging CT CAP- Significant interval treatment response.   10/11/2015 Imaging CT CAP- progression of metastatic disease in the liver, with enlargement of the previously noted index lesion in segment 4B, and 2 new lesions adjacent to the gallbladder fossa, as detailed above.    10/11/2015 Progression CT scan  demonstrates progression of disease.     CURRENT THERAPY: Keytruda  INTERVAL HISTORY: Harry Franklin 75 y.o. male returns for followup of Metastatic, high grade urothelial cancer. He is here after receiving his first cycle of Keytruda.  Harry Franklin is accompanied by two family members today. I personally reviewed and went over laboratory studies with the patient.  He complains of a minor dry cough that happens more in the mornings and at night. He denies SOB. Cough is not productive nor worse than previous. He continues to run a low grade fever at night, around 99. Notes some abdominal soreness but no pain. He is mostly unchanged and does not have any major complaints.  He got out yesterday and worked on his lawnmower. States he is eating more or less, some days more than others. He notes that he feels pretty well, thus far no problems with therapy.    Past Medical History  Diagnosis Date  . GERD (gastroesophageal reflux disease)   . Hypokalemia   . Hypertension   . Cataract   . Arthritis   . Macular degeneration, age related   . DVT (deep venous thrombosis) ([place])     developed after traveling x 1, developed another after left foot surgery  . Hydrocele in adult   . Metastatic urothelial carcinoma ([place]) 04/12/2015  . Bone metastases ([place]) 09/21/2015    has Dysphagia; Encounter for screening colonoscopy; Dysphagia, pharyngoesophageal phase; Reflux esophagitis; Peptic stricture of esophagus; Hiatal hernia; Abdominal pain; Metastatic urothelial carcinoma ([place]); Dyspnea; Elevated troponin; Atrial fibrillation with rapid ventricular response ([place]); Antineoplastic chemotherapy induced pancytopenia [facility]); NSTEMI (non-ST elevated myocardial infarction) ([place]); Recurrent falls; Acute pulmonary edema ([place]); Cardiac arrest ([place]); Respiratory arrest ([place]); AKI (acute kidney injury) ([place]); Thrombocytopenia ([place]); Absolute  anemia; Acute hypoxemic respiratory failure ([place]); Malignant neoplasm of  urinary bladder ([place]); Acute pulmonary embolism ([place]); and Bone metastases ([place]) on his problem list.     is allergic to neulasta; rabeprazole; and vancomycin.  Current Outpatient Prescriptions on File Prior to Visit  Medication Sig Dispense Refill  . acetaminophen (TYLENOL) 325 MG tablet Take 650 mg by mouth every 6 (six) hours as needed for mild pain or fever. Reported on 08/12/2015    . atorvastatin (LIPITOR) 80 MG tablet Take 1 tablet (80 mg total) by mouth daily at 6 PM. 90 tablet 3  . Calcium Carb-Cholecalciferol (CALCIUM 500 + D3) 500-600 MG-UNIT TABS Take 2 tablets by mouth daily.    . carvedilol (COREG) 12.5 MG tablet Take 1 tablet (12.5 mg total) by mouth 2 (two) times daily. 180 tablet 3  . furosemide (LASIX) 40 MG tablet Take 1 tablet (40 mg total) by mouth daily. (Patient taking differently: Take 60 mg by mouth daily. ) 30 tablet 0  . furosemide (LASIX) 40 MG tablet TAKE 1 1/2 TABLETS DAILY. MAY TAKE AN EXTRA 40 MG DAILY FOR WEIGHTS GREATER THAN 263. 150 tablet 3  . lisinopril (PRINIVIL,ZESTRIL) 2.5 MG tablet Take 1 tablet (2.5 mg total) by mouth daily. 90 tablet 3  . Multiple Vitamins-Minerals (PRESERVISION AREDS 2 PO) Take 1 tablet by mouth 2 (two) times daily.    Marland Kitchen omeprazole (PRILOSEC) 40 MG capsule Take 40 mg by mouth daily.    . Pembrolizumab (KEYTRUDA IV) Inject into the vein every 21 ( twenty-one) days. Reported on 10/12/2015    . potassium chloride (KLOR-CON) 8 MEQ tablet Take 1 tablet (8 mEq total) by mouth 2 (two) times daily. 180 tablet 3  . predniSONE (DELTASONE) 20 MG tablet At the onset of diarrhea, take 6 tablets ('120mg'$ ) daily. Call [place]. (Patient not taking: Reported on 10/12/2015) 42 tablet 0  . rivaroxaban (XARELTO) 20 MG TABS tablet Take 1 tablet (20 mg total) by mouth daily with supper. 30 tablet 3   No current facility-administered medications on file prior to visit.    Past Surgical History  Procedure Laterality Date  . Replacement total knee    .  Appendectomy    . Hammer toe surgery Left   . Lapidus procedure    . Hallux fusion    . Colonoscopy  03/31/05  . Colonoscopy N/A 10/02/2014    LYY:TKPTWSF diverticulosis  . Esophagogastroduodenoscopy N/A 10/02/2014    KCL:EXNTZ ulcerative reflux/s/p dilation/large HH  . [place] dilation N/A 10/02/2014    Procedure: Venia Minks DILATION;  Surgeon: Daneil Dolin, MD;  [facility];  Service: Endoscopy;  Laterality: N/A;  . Esophagogastroduodenoscopy N/A 01/29/2015    Procedure: ESOPHAGOGASTRODUODENOSCOPY (EGD);  Surgeon: Daneil Dolin, MD;  Location: [facility];  Service: Endoscopy;  Laterality: N/A;  0800-rescheduled to 9/9 @ 915 [place] notified pt  . Esophageal dilation  01/29/2015    Procedure: ESOPHAGEAL DILATION;  Surgeon: Daneil Dolin, MD;  Location: [facility];  Service: Endoscopy;;  . Portacath placement Left 04/13/15    REVIEW OF SYSTEMS Positive for fevers     Low grade fevers at night, about 99 F Positive for cough     Dry cough in mornings and at night. Denies any headaches, double vision, chills, night sweats, nausea, vomiting, constipation, chest pain, heart palpitations, shortness of breath, blood in stool, black tarry stool, urinary pain, urinary burning, urinary frequency, hematuria. 14 point review of systems was performed and is negative except as  detailed under history of present illness and above   PHYSICAL EXAMINATION  ECOG PERFORMANCE STATUS: 0 - Asymptomatic   BP 126/68 mmHg  Pulse 77  Temp(Src) 97.9 F (36.6 C)  Resp 18  Wt 263 lb 4.8 oz (119.432 kg)  SpO2 99%  GENERAL:alert, no distress, well nourished, well developed, comfortable, cooperative, smiling and accompanied by family. Wears glasses. SKIN: skin color, texture, turgor are normal, no rashes or significant lesions HEAD: Normocephalic, No masses, lesions, tenderness or abnormalities EYES: normal, EOMI, Conjunctiva are pink and non-injected EARS: External ears normal OROPHARYNX:lips,  buccal mucosa, and tongue normal and mucous membranes are moist  NECK: supple, no stridor, trachea midline LYMPH:  no palpable lymphadenopathy, no hepatosplenomegaly BREAST:not examined LUNGS: clear to auscultation and percussion HEART: regular rate & rhythm ABDOMEN:abdomen soft, non-tender, normal bowel sounds and no masses or organomegaly BACK: Back symmetric, no curvature. EXTREMITIES:less then 2 second capillary refill, no edema, no skin discoloration, no clubbing, no cyanosis. Right leg larger than left, chronic from prior right knee surgery. Ted hose  L foot brace NEURO: alert & oriented x 3 with fluent speech, no focal motor/sensory deficits, gait normal  LABORATORY DATA: I have reviewed the data as listed  Results for TKAI, SERFASS (MRN [id number]) as of 10/27/2015 08:47  Ref. Range 10/27/2015 08:22  WBC Latest Ref Range: 4.0-10.5 K/uL 5.5  RBC Latest Ref Range: 4.22-5.81 MIL/uL 3.43 (L)  Hemoglobin Latest Ref Range: 13.0-17.0 g/dL 11.2 (L)  HCT Latest Ref Range: 39.0-52.0 % 34.1 (L)  MCV Latest Ref Range: 78.0-100.0 fL 99.4  MCH Latest Ref Range: 26.0-34.0 pg 32.7  MCHC Latest Ref Range: 30.0-36.0 g/dL 32.8  RDW Latest Ref Range: 11.5-15.5 % 14.4  Platelets Latest Ref Range: 150-400 K/uL 166  Neutrophils Latest Units: % 67  Lymphocytes Latest Units: % 19  Monocytes Relative Latest Units: % 11  Eosinophil Latest Units: % 3  Basophil Latest Units: % 0  NEUT# Latest Ref Range: 1.7-7.7 K/uL 3.6  Lymphocyte # Latest Ref Range: 0.7-4.0 K/uL 1.0  Monocyte # Latest Ref Range: 0.1-1.0 K/uL 0.6  Eosinophils Absolute Latest Ref Range: 0.0-0.7 K/uL 0.2  Basophils Absolute Latest Ref Range: 0.0-0.1 K/uL 0.0   Results for ISAC, LINCKS (MRN [id number]) as of 10/27/2015 19:34  Ref. Range 10/27/2015 08:22  Sodium Latest Ref Range: 135-145 mmol/L 135  Potassium Latest Ref Range: 3.5-5.1 mmol/L 4.0  Chloride Latest Ref Range: 101-111 mmol/L 102  CO2 Latest Ref Range: 22-32 mmol/L 26  BUN  Latest Ref Range: 6-20 mg/dL 23 (H)  Creatinine Latest Ref Range: 0.61-1.24 mg/dL 1.33 (H)  Calcium Latest Ref Range: 8.9-10.3 mg/dL 8.6 (L)  EGFR (Non-African Amer.) Latest Ref Range: >60 mL/min 52 (L)  EGFR (African American) Latest Ref Range: >60 mL/min >60  Glucose Latest Ref Range: 65-99 mg/dL 130 (H)  Anion gap Latest Ref Range: 5-15  7  Alkaline Phosphatase Latest Ref Range: 38-126 U/L 134 (H)  Albumin Latest Ref Range: 3.5-5.0 g/dL 3.0 (L)  AST Latest Ref Range: 15-41 U/L 34  ALT Latest Ref Range: 17-63 U/L 27  Total Protein Latest Ref Range: 6.5-8.1 g/dL 6.4 (L)  Total Bilirubin Latest Ref Range: 0.3-1.2 mg/dL 1.1    RADIOGRAPHIC STUDIES: I have personally reviewed the radiological images as listed and agreed with the findings in the report.  Study Result     CLINICAL DATA: 75 year old male with history of metastatic urothelial carcinoma diagnosed in November 2016 status post chemotherapy. Restaging examination.  EXAM: CT CHEST, ABDOMEN, AND  PELVIS WITH CONTRAST  TECHNIQUE: Multidetector CT imaging of the chest, abdomen and pelvis was performed following the standard protocol during bolus administration of intravenous contrast.  CONTRAST: 186m ISOVUE-300 IOPAMIDOL (ISOVUE-300) INJECTION 61%  COMPARISON: CT the chest, abdomen and pelvis 08/10/2015.  FINDINGS: CT CHEST FINDINGS  Mediastinum/Lymph Nodes: Heart size is normal. There is no significant pericardial fluid, thickening or pericardial calcification. There is atherosclerosis of the thoracic aorta, the great vessels of the mediastinum and the coronary arteries, including calcified atherosclerotic plaque in the left main, left anterior descending, left circumflex and right coronary arteries. No pathologically enlarged mediastinal or hilar lymph nodes. Moderate-sized hiatal hernia. No axillary lymphadenopathy. Right internal jugular single-lumen porta cath with tip terminating in the right  atrium.  Lungs/Pleura: Tiny 5 mm nodule in the lateral segment of the right middle lobe (image 106 of series 7) is unchanged. 6 mm subpleural nodule of the base of the left lower lobe (image 122 of series 7) is also unchanged. No other larger more suspicious appearing pulmonary nodules or masses are noted. Bilateral apical pleuroparenchymal thickening and architectural distortion is most compatible with chronic post infectious or inflammatory scarring. Mild diffuse bronchial wall thickening with mild centrilobular and paraseptal emphysema, predominantly noted in the lung apices. No acute consolidative airspace disease. Previously noted trace left pleural effusion has resolved. Previously noted trace right pleural effusion has nearly completely resolved.  Musculoskeletal/Soft Tissues: There are no aggressive appearing lytic or blastic lesions noted in the visualized portions of the skeleton. Posttraumatic deformity of the medial right clavicle again incidentally noted.  CT ABDOMEN AND PELVIS FINDINGS  Hepatobiliary: Compared to the prior study the index hepatic metastasis centered in segment 4B has significantly increased in size, currently measuring 3.0 x 5.1 cm (image 66 of series 2) as compared with 1.9 x 1.8 cm on 08/10/2015. There are 2 new lesions adjacent to the gallbladder fossa in segment 4A (image 64 of series 2) and segment 5 (image 69 of series 2), with the largest of these in segment 5 measuring 3.3 x 2.0 cm. Well-defined 12 mm low-attenuation lesion in segment 6 of the liver is unchanged, compatible with a simple cyst. No intra or extrahepatic biliary ductal dilatation. There appears to be some amorphous potentially enhancing soft tissue within the gallbladder, most evident near the neck of the gallbladder best visualized on image 71 of series 2. Much of this appears nondependent, and is concerning for possible metastatic disease the gallbladder.  Pancreas: No  pancreatic mass. No pancreatic ductal dilatation. No pancreatic or peripancreatic fluid or inflammatory changes.  Spleen: Spleen is normal in appearance.  Adrenals/Urinary Tract: Bilateral kidneys and the right adrenal gland are normal in appearance. Lobular contour of the left adrenal gland again noted, including a 1.4 x 1.0 cm area in the mid body of the gland, which is stable compared to the prior examination. No hydroureteronephrosis. Urinary bladder is nearly decompressed, but unremarkable in appearance.  Stomach/Bowel: Normal appearance of the stomach. No pathologic dilatation of small bowel or colon. Numerous colonic diverticulae are noted, without surrounding inflammatory changes to suggest an acute diverticulitis at this time. The appendix is not confidently identified may be surgically absent. Regardless, there are no inflammatory changes noted adjacent to the cecum to suggest the presence of an acute appendicitis at this time.  Vascular/Lymphatic: Atherosclerosis throughout the abdominal and pelvic vasculature, without evidence of aneurysm or dissection. Enlarged portacaval lymph node measuring 1.7 cm in short axis, similar to the prior study. No new lymphadenopathy noted  in the abdomen or pelvis.  Reproductive: Prostate gland and seminal vesicles are unremarkable in appearance. Incidental imaging of the upper scrotum demonstrates a hydrocele (likely on the left) which is at least moderate to large in size.  Other: No significant volume of ascites. No pneumoperitoneum.  Musculoskeletal: Lytic slightly expansile lesion in the right side of the L2 vertebral body again noted, with well-defined sclerotic margins and narrow zone of transition, compatible with a treated metastatic lesion. No other new aggressive appearing lytic or blastic lesions are noted elsewhere in the visualized axial or appendicular skeleton.  IMPRESSION: 1. Today's study demonstrates  progression of metastatic disease in the liver, with enlargement of the previously noted index lesion in segment 4B, and 2 new lesions adjacent to the gallbladder fossa, as detailed above. 2. In addition, there is some abnormal soft tissue within the gallbladder, which is concerning for potential metastatic disease to the gallbladder. A second primary gallbladder neoplasm is not excluded, but is not favored. This could be further evaluated with abdominal ultrasound or MRI of the abdomen with and without IV gadolinium with MRCP if clinically appropriate. 3. The previously noted mildly enlarged portacaval lymph node is unchanged. 4. Previously noted treated metastatic lesion in the right side of the L2 vertebral body is unchanged. No new osseous lesions are identified. 5. Urinary bladder is nearly decompressed, but grossly unremarkable in appearance. 6. Additional incidental findings, as above, similar prior examinations. These results will be called to the ordering clinician or representative by the Radiologist Assistant, and communication documented in the PACS or zVision Dashboard.   Electronically Signed  By: Vinnie Langton M.D.  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  Atrial fibrillation with RVR CARBO/GEMZAR dose reduction secondary to above and pancytopenia Bilateral DVT/PE, respiratory arrest Progression of disease on CT 10/11/2015 Keytruda  We reviewed laboratory studies including CBC and CMP. He is doing well.  He will return for follow up with his next [facility] treatment on 11/08/2015.  I again reviewed side effects and symptoms of concern with immunotherapy. He has an immunotherapy "card" that he carries in his wallet. He will continue on XARELTO  and XGEVA. Follow-up as detailed.  Orders Placed This Encounter  Procedures  . Comprehensive metabolic panel    Standing Status: Future     Number of Occurrences: 1     Standing Expiration Date: 10/26/2016  . SCHEDULING COMMUNICATION    Schedule 15 minute injection appointment   All questions were answered. The patient knows to call the clinic with any problems, questions or concerns. We can certainly see the patient much sooner if necessary.  This document serves as a record of services personally performed by Ancil Linsey, MD. It was created on her behalf by Arlyce Harman, a trained medical scribe. The creation of this record is based on the scribe's personal observations and the provider's statements to them. This document has been checked and approved by the attending provider.  I have reviewed the above documentation for accuracy and completeness, and I agree with the above.  This note is electronically signed by: Molli Hazard, MD  10/27/2015 9:09 AM

## 2015-10-30 ENCOUNTER — Encounter (HOSPITAL_COMMUNITY): Payer: Self-pay | Admitting: *Deleted

## 2015-10-30 ENCOUNTER — Emergency Department (HOSPITAL_COMMUNITY)
Admission: EM | Admit: 2015-10-30 | Discharge: 2015-10-31 | Disposition: A | Discharge status: Home / Self Care | Payer: Medicare HMO | Attending: Emergency Medicine | Admitting: Emergency Medicine

## 2015-10-30 DIAGNOSIS — Z87891 Personal history of nicotine dependence: Secondary | ICD-10-CM | POA: Insufficient documentation

## 2015-10-30 DIAGNOSIS — M1990 Unspecified osteoarthritis, unspecified site: Secondary | ICD-10-CM | POA: Diagnosis not present

## 2015-10-30 DIAGNOSIS — Z79899 Other long term (current) drug therapy: Secondary | ICD-10-CM | POA: Diagnosis not present

## 2015-10-30 DIAGNOSIS — I129 Hypertensive chronic kidney disease with stage 1 through stage 4 chronic kidney disease, or unspecified chronic kidney disease: Secondary | ICD-10-CM | POA: Diagnosis not present

## 2015-10-30 DIAGNOSIS — R509 Fever, unspecified: Secondary | ICD-10-CM | POA: Diagnosis present

## 2015-10-30 DIAGNOSIS — N289 Disorder of kidney and ureter, unspecified: Secondary | ICD-10-CM

## 2015-10-30 DIAGNOSIS — N189 Chronic kidney disease, unspecified: Secondary | ICD-10-CM | POA: Insufficient documentation

## 2015-10-30 NOTE — ED Notes (Signed)
Pt states Dr. Whitney Muse told him that if he runs a fever of 100.5 to come to ED; pt states he has been running fever of 100.4;

## 2015-10-31 ENCOUNTER — Emergency Department (HOSPITAL_COMMUNITY): Payer: Medicare HMO

## 2015-10-31 LAB — CBC WITH DIFFERENTIAL/PLATELET
BASOS PCT: 0 %
Basophils Absolute: 0 10*3/uL (ref 0.0–0.1)
EOS ABS: 0.1 10*3/uL (ref 0.0–0.7)
Eosinophils Relative: 2 %
HCT: 31.2 % — ABNORMAL LOW (ref 39.0–52.0)
HEMOGLOBIN: 10.5 g/dL — AB (ref 13.0–17.0)
Lymphocytes Relative: 20 %
Lymphs Abs: 1.4 10*3/uL (ref 0.7–4.0)
MCH: 32.8 pg (ref 26.0–34.0)
MCHC: 33.7 g/dL (ref 30.0–36.0)
MCV: 97.5 fL (ref 78.0–100.0)
Monocytes Absolute: 1 10*3/uL (ref 0.1–1.0)
Monocytes Relative: 14 %
NEUTROS PCT: 64 %
Neutro Abs: 4.5 10*3/uL (ref 1.7–7.7)
PLATELETS: 189 10*3/uL (ref 150–400)
RBC: 3.2 MIL/uL — AB (ref 4.22–5.81)
RDW: 14.5 % (ref 11.5–15.5)
WBC: 7.1 10*3/uL (ref 4.0–10.5)

## 2015-10-31 LAB — COMPREHENSIVE METABOLIC PANEL
ALBUMIN: 3 g/dL — AB (ref 3.5–5.0)
ALK PHOS: 130 U/L — AB (ref 38–126)
ALT: 28 U/L (ref 17–63)
ANION GAP: 9 (ref 5–15)
AST: 34 U/L (ref 15–41)
BUN: 35 mg/dL — ABNORMAL HIGH (ref 6–20)
CALCIUM: 8.3 mg/dL — AB (ref 8.9–10.3)
CHLORIDE: 100 mmol/L — AB (ref 101–111)
CO2: 23 mmol/L (ref 22–32)
CREATININE: 1.77 mg/dL — AB (ref 0.61–1.24)
GFR calc Af Amer: 42 mL/min — ABNORMAL LOW (ref >60)
GFR calc non Af Amer: 37 mL/min — ABNORMAL LOW (ref >60)
GLUCOSE: 113 mg/dL — AB (ref 65–99)
Potassium: 3.7 mmol/L (ref 3.5–5.1)
SODIUM: 132 mmol/L — AB (ref 135–145)
Total Bilirubin: 1.2 mg/dL (ref 0.3–1.2)
Total Protein: 7 g/dL (ref 6.5–8.1)

## 2015-10-31 NOTE — ED Provider Notes (Signed)
CSN: [id number]     Arrival date & time 10/30/15  2328 History  By signing my name below, I, Hansel Feinstein, attest that this documentation has been prepared under the direction and in the presence of Jola Schmidt, MD. Electronically Signed: Hansel Feinstein, ED Scribe. 10/31/2015. 12:22 AM.     Chief Complaint  Patient presents with  . Fever   The history is provided by the patient. No language interpreter was used.   HPI Comments: Harry Franklin is a 75 y.o. 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 Pt states that he felt generally fatigued today, which prompted him to check his temperature. He also reports that he has had a mild cough for a week, which has not worsened today. Pt notes he is on a medication that he was informed could cause a cough. He denies nausea, emesis, diarrhea, congestion, SOB, CP, abdominal pain, dysuria, hematuria, recent weight gain. Pt has h/o CHF.    Past Medical History  Diagnosis Date  . GERD (gastroesophageal reflux disease)   . Hypokalemia   . Hypertension   . Cataract   . Arthritis   . Macular degeneration, age related   . DVT (deep venous thrombosis) ([place])     developed after traveling x 1, developed another after left foot surgery  . Hydrocele in adult   . Metastatic urothelial carcinoma ([place]) 04/12/2015  . Bone metastases ([place]) 09/21/2015   Past Surgical History  Procedure Laterality Date  . Replacement total knee    . Appendectomy    . Hammer toe surgery Left   . Lapidus procedure    . Hallux fusion    . Colonoscopy  03/31/05  . Colonoscopy N/A 10/02/2014    [id number] diverticulosis  . Esophagogastroduodenoscopy N/A 10/02/2014    [id number] ulcerative reflux/s/p dilation/large HH  . [place] dilation N/A 10/02/2014    Procedure: Venia Minks DILATION;  Surgeon: Daneil Dolin, MD;  [facility];  Service: Endoscopy;  Laterality:  N/A;  . Esophagogastroduodenoscopy N/A 01/29/2015    Procedure: ESOPHAGOGASTRODUODENOSCOPY (EGD);  Surgeon: Daneil Dolin, MD;  Location: [facility];  Service: Endoscopy;  Laterality: N/A;  0800-rescheduled to 9/9 @ 915 [place] notified pt  . Esophageal dilation  01/29/2015    Procedure: ESOPHAGEAL DILATION;  Surgeon: Daneil Dolin, MD;  Location: [facility];  Service: Endoscopy;;  . Portacath placement Left 04/13/15   Family History  Problem Relation Age of Onset  . Colon cancer Maternal Uncle   . Heart disease Father    Social History  Substance Use Topics  . Smoking status: Former Smoker -- 1.00 packs/day for 31 years    Types: Cigarettes    Quit date: 01/22/1984  . Smokeless tobacco: Never Used     Comment: qUIT IN 1985  . Alcohol Use: No    Review of Systems A complete 10 system review of systems was obtained and all systems are negative except as noted in the HPI and PMH.    Allergies  Neulasta; Rabeprazole; and Vancomycin  Home Medications   Prior to Admission medications   Medication Sig Start Date End Date Taking? Authorizing Provider  acetaminophen (TYLENOL) 325 MG tablet Take 650 mg by mouth every 6 (six) hours as needed for mild pain or fever. Reported on 08/12/2015    Historical Provider, MD  atorvastatin (LIPITOR) 80 MG tablet Take 1 tablet (80 mg total) by mouth  daily at 6 PM. 06/25/15   Lendon Colonel, NP  Calcium Carb-Cholecalciferol (CALCIUM 500 + D3) 500-600 MG-UNIT TABS Take 2 tablets by mouth daily.    Historical Provider, MD  carvedilol (COREG) 12.5 MG tablet Take 1 tablet (12.5 mg total) by mouth 2 (two) times daily. 10/12/15   Arnoldo Lenis, MD  furosemide (LASIX) 40 MG tablet Take 1 tablet (40 mg total) by mouth daily. Patient taking differently: Take 60 mg by mouth daily.  08/09/15   Donne Hazel, MD  furosemide (LASIX) 40 MG tablet TAKE 1 1/2 TABLETS DAILY. MAY TAKE AN EXTRA 40 MG DAILY FOR WEIGHTS GREATER THAN 263. 10/06/15   Arnoldo Lenis,  MD  lisinopril (PRINIVIL,ZESTRIL) 2.5 MG tablet Take 1 tablet (2.5 mg total) by mouth daily. 08/26/15   Arnoldo Lenis, MD  Multiple Vitamins-Minerals (PRESERVISION AREDS 2 PO) Take 1 tablet by mouth 2 (two) times daily.    Historical Provider, MD  omeprazole (PRILOSEC) 40 MG capsule Take 40 mg by mouth daily.    Historical Provider, MD  Pembrolizumab (KEYTRUDA IV) Inject into the vein every 21 ( twenty-one) days. Reported on 10/12/2015    Historical Provider, MD  potassium chloride (KLOR-CON) 8 MEQ tablet Take 1 tablet (8 mEq total) by mouth 2 (two) times daily. 10/06/15   Arnoldo Lenis, MD  predniSONE (DELTASONE) 20 MG tablet At the onset of diarrhea, take 6 tablets (120mg ) daily. Call [place]. Patient not taking: Reported on 10/12/2015 10/11/15   Baird Cancer, PA-C  rivaroxaban (XARELTO) 20 MG TABS tablet Take 1 tablet (20 mg total) by mouth daily with supper. 09/15/15   Manon Hilding Kefalas, PA-C   BP 114/61 mmHg  Pulse 90  Temp(Src) 98.8 F (37.1 C) (Oral)  Resp 18  Ht 6\' 4"  (1.93 m)  Wt 260 lb (117.935 kg)  BMI 31.66 kg/m2  SpO2 95% Physical Exam  Constitutional: He is oriented to person, place, and time. He appears well-developed and well-nourished.  HENT:  Head: Normocephalic and atraumatic.  Eyes: EOM are normal.  Neck: Normal range of motion.  Cardiovascular: Normal rate, regular rhythm, normal heart sounds and intact distal pulses.   Pulmonary/Chest: Effort normal and breath sounds normal. No respiratory distress.  Abdominal: Soft. He exhibits no distension. There is no tenderness.  Musculoskeletal: Normal range of motion.  Neurological: He is alert and oriented to person, place, and time.  Skin: Skin is warm and dry.  Psychiatric: He has a normal mood and affect. Judgment normal.  Nursing note and vitals reviewed.   ED Course  Procedures (including critical care time) DIAGNOSTIC STUDIES: Oxygen Saturation is 95% on RA, adequate by my interpretation.     COORDINATION OF CARE: 12:20 AM Discussed treatment plan with pt at bedside which includes CXR, lab work and pt agreed to plan.   Labs Review Labs Reviewed  CBC WITH DIFFERENTIAL/PLATELET - Abnormal; Notable for the following:    RBC 3.20 (*)    Hemoglobin 10.5 (*)    HCT 31.2 (*)    All other components within normal limits  COMPREHENSIVE METABOLIC PANEL - Abnormal; Notable for the following:    Sodium 132 (*)    Chloride 100 (*)    Glucose, Bld 113 (*)    BUN 35 (*)    Creatinine, Ser 1.77 (*)    Calcium 8.3 (*)    Albumin 3.0 (*)    Alkaline Phosphatase 130 (*)    GFR calc non Af Amer 37 (*)  GFR calc Af Amer 42 (*)    All other components within normal limits  CULTURE, BLOOD (ROUTINE X 2)  CULTURE, BLOOD (ROUTINE X 2)  URINE CULTURE  URINALYSIS, ROUTINE W REFLEX MICROSCOPIC (NOT AT [facility])    BUN  Date Value Ref Range Status  10/31/2015 35* 6 - 20 mg/dL Final  10/27/2015 23* 6 - 20 mg/dL Final  10/20/2015 23* 6 - 20 mg/dL Final  10/08/2015 27* 6 - 20 mg/dL Final   CREATININE, SER  Date Value Ref Range Status  10/31/2015 1.77* 0.61 - 1.24 mg/dL Final  10/27/2015 1.33* 0.61 - 1.24 mg/dL Final  10/20/2015 1.28* 0.61 - 1.24 mg/dL Final  10/08/2015 1.39* 0.61 - 1.24 mg/dL Final       Imaging Review Dg Chest 2 View  10/31/2015  CLINICAL DATA:  74 year old male with fever. Metastatic urethral carcinoma. [place] therapy. EXAM: CHEST  2 VIEW COMPARISON:  CT of the chest abdomen pelvis dated 10/11/2015 FINDINGS: Two views of the chest demonstrate mild diffuse interstitial coarsening, likely chronic. There is no focal consolidation, pleural effusion, or pneumothorax. Right apical pleural with pleural parenchymal scarring. Right pectoral Port-A-Cath with tip over the central SVC. The cardiac silhouette is within normal limits. There is osteopenia with degenerative changes of the spine. No acute fracture. IMPRESSION: No acute cardiopulmonary process. Electronically Signed    By: Anner Crete M.D.   On: 10/31/2015 01:41   I have personally reviewed and evaluated these images and lab results as part of my medical decision-making.   EKG Interpretation None      MDM   Final diagnoses:  Fever, unspecified fever cause  Renal insufficiency    Overall the patient is well-appearing. Chest x-ray is without infiltrate. Blood cultures are pending. Patient is well-appearing. Mild increase in his renal insufficiency. This will need to be rechecked by his primary care physician. Primary care follow-up. He understands to return to the ER for new or worsening symptoms  I personally performed the services described in this documentation, which was scribed in my presence. The recorded information has been reviewed and is accurate.      Jola Schmidt, MD 10/31/15 [phone]

## 2015-11-01 ENCOUNTER — Telehealth (HOSPITAL_COMMUNITY): Payer: Self-pay

## 2015-11-01 ENCOUNTER — Telehealth (HOSPITAL_COMMUNITY): Payer: Self-pay | Admitting: *Deleted

## 2015-11-01 NOTE — Telephone Encounter (Signed)
Returned patient call to let him know to drink plenty of fluids and repeat blood work on Wednesday. I made him an appointment and he is aware.

## 2015-11-03 ENCOUNTER — Encounter (HOSPITAL_COMMUNITY): Payer: Medicare HMO

## 2015-11-03 DIAGNOSIS — C7910 Secondary malignant neoplasm of unspecified urinary organs: Secondary | ICD-10-CM

## 2015-11-03 LAB — COMPREHENSIVE METABOLIC PANEL
ALT: 32 U/L (ref 17–63)
AST: 36 U/L (ref 15–41)
Albumin: 2.8 g/dL — ABNORMAL LOW (ref 3.5–5.0)
Alkaline Phosphatase: 110 U/L (ref 38–126)
Anion gap: 5 (ref 5–15)
BILIRUBIN TOTAL: 0.9 mg/dL (ref 0.3–1.2)
BUN: 24 mg/dL — AB (ref 6–20)
CO2: 26 mmol/L (ref 22–32)
CREATININE: 1.34 mg/dL — AB (ref 0.61–1.24)
Calcium: 8.3 mg/dL — ABNORMAL LOW (ref 8.9–10.3)
Chloride: 99 mmol/L — ABNORMAL LOW (ref 101–111)
GFR calc Af Amer: 59 mL/min — ABNORMAL LOW (ref >60)
GFR, EST NON AFRICAN AMERICAN: 51 mL/min — AB (ref >60)
Glucose, Bld: 117 mg/dL — ABNORMAL HIGH (ref 65–99)
Potassium: 3.9 mmol/L (ref 3.5–5.1)
Sodium: 130 mmol/L — ABNORMAL LOW (ref 135–145)
TOTAL PROTEIN: 6.6 g/dL (ref 6.5–8.1)

## 2015-11-05 ENCOUNTER — Ambulatory Visit (HOSPITAL_COMMUNITY): Payer: Medicare HMO

## 2015-11-05 ENCOUNTER — Other Ambulatory Visit (HOSPITAL_COMMUNITY): Payer: Medicare HMO

## 2015-11-05 ENCOUNTER — Encounter (HOSPITAL_COMMUNITY): Payer: Medicare HMO

## 2015-11-06 LAB — CULTURE, BLOOD (ROUTINE X 2)
CULTURE: NO GROWTH
CULTURE: NO GROWTH

## 2015-11-09 ENCOUNTER — Ambulatory Visit (HOSPITAL_COMMUNITY): Payer: Medicare HMO

## 2015-11-09 ENCOUNTER — Other Ambulatory Visit (HOSPITAL_COMMUNITY): Payer: Medicare HMO

## 2015-11-10 ENCOUNTER — Encounter (HOSPITAL_BASED_OUTPATIENT_CLINIC_OR_DEPARTMENT_OTHER): Payer: Medicare HMO | Admitting: Oncology

## 2015-11-10 ENCOUNTER — Encounter (HOSPITAL_COMMUNITY): Payer: Medicare HMO

## 2015-11-10 ENCOUNTER — Encounter (HOSPITAL_BASED_OUTPATIENT_CLINIC_OR_DEPARTMENT_OTHER): Payer: Medicare HMO

## 2015-11-10 VITALS — BP 128/64 | HR 74 | Temp 97.8°F | Resp 20 | Wt 264.0 lb

## 2015-11-10 DIAGNOSIS — Z5112 Encounter for antineoplastic immunotherapy: Secondary | ICD-10-CM

## 2015-11-10 DIAGNOSIS — C649 Malignant neoplasm of unspecified kidney, except renal pelvis: Secondary | ICD-10-CM

## 2015-11-10 DIAGNOSIS — C7910 Secondary malignant neoplasm of unspecified urinary organs: Secondary | ICD-10-CM

## 2015-11-10 DIAGNOSIS — C787 Secondary malignant neoplasm of liver and intrahepatic bile duct: Secondary | ICD-10-CM

## 2015-11-10 DIAGNOSIS — C7972 Secondary malignant neoplasm of left adrenal gland: Secondary | ICD-10-CM | POA: Diagnosis not present

## 2015-11-10 DIAGNOSIS — C679 Malignant neoplasm of bladder, unspecified: Secondary | ICD-10-CM | POA: Diagnosis not present

## 2015-11-10 DIAGNOSIS — C7971 Secondary malignant neoplasm of right adrenal gland: Secondary | ICD-10-CM

## 2015-11-10 LAB — CBC WITH DIFFERENTIAL/PLATELET
Basophils Absolute: 0 10*3/uL (ref 0.0–0.1)
Basophils Relative: 0 %
EOS PCT: 4 %
Eosinophils Absolute: 0.2 10*3/uL (ref 0.0–0.7)
HEMATOCRIT: 32.7 % — AB (ref 39.0–52.0)
HEMOGLOBIN: 10.7 g/dL — AB (ref 13.0–17.0)
LYMPHS ABS: 1.3 10*3/uL (ref 0.7–4.0)
LYMPHS PCT: 22 %
MCH: 32.2 pg (ref 26.0–34.0)
MCHC: 32.7 g/dL (ref 30.0–36.0)
MCV: 98.5 fL (ref 78.0–100.0)
Monocytes Absolute: 0.6 10*3/uL (ref 0.1–1.0)
Monocytes Relative: 10 %
NEUTROS ABS: 3.6 10*3/uL (ref 1.7–7.7)
NEUTROS PCT: 64 %
Platelets: 213 10*3/uL (ref 150–400)
RBC: 3.32 MIL/uL — AB (ref 4.22–5.81)
RDW: 14 % (ref 11.5–15.5)
WBC: 5.7 10*3/uL (ref 4.0–10.5)

## 2015-11-10 LAB — TSH: TSH: 0.832 u[IU]/mL (ref 0.350–4.500)

## 2015-11-10 LAB — COMPREHENSIVE METABOLIC PANEL
ALK PHOS: 155 U/L — AB (ref 38–126)
ALT: 42 U/L (ref 17–63)
AST: 50 U/L — AB (ref 15–41)
Albumin: 2.8 g/dL — ABNORMAL LOW (ref 3.5–5.0)
Anion gap: 5 (ref 5–15)
BILIRUBIN TOTAL: 1 mg/dL (ref 0.3–1.2)
BUN: 23 mg/dL — AB (ref 6–20)
CALCIUM: 8.5 mg/dL — AB (ref 8.9–10.3)
CO2: 28 mmol/L (ref 22–32)
CREATININE: 1.43 mg/dL — AB (ref 0.61–1.24)
Chloride: 100 mmol/L — ABNORMAL LOW (ref 101–111)
GFR, EST AFRICAN AMERICAN: 55 mL/min — AB (ref >60)
GFR, EST NON AFRICAN AMERICAN: 47 mL/min — AB (ref >60)
Glucose, Bld: 139 mg/dL — ABNORMAL HIGH (ref 65–99)
Potassium: 3.9 mmol/L (ref 3.5–5.1)
Sodium: 133 mmol/L — ABNORMAL LOW (ref 135–145)
Total Protein: 6.8 g/dL (ref 6.5–8.1)

## 2015-11-10 MED ORDER — HEPARIN SOD (PORK) LOCK FLUSH 100 UNIT/ML IV SOLN: 500.0000 [IU] | Freq: Once | INTRAVENOUS | Status: DC | PRN

## 2015-11-10 MED ORDER — SODIUM CHLORIDE 0.9 % IV SOLN
Freq: Once | INTRAVENOUS | Status: DC
Start: 2015-11-10 — End: 2015-11-10
  Administered 2015-11-10: 15:00:00 via INTRAVENOUS

## 2015-11-10 MED ORDER — HEPARIN SOD (PORK) LOCK FLUSH 100 UNIT/ML IV SOLN
500.0000 [IU] | Freq: Once | INTRAVENOUS | Status: AC
  Administered 2015-11-10: 500 [IU] via INTRAVENOUS

## 2015-11-10 MED ORDER — SODIUM CHLORIDE 0.9 % IV SOLN
200.0000 mg | Freq: Once | INTRAVENOUS | Status: DC
  Administered 2015-11-10: 200 mg via INTRAVENOUS
  Filled 2015-11-10: qty 8

## 2015-11-10 MED ORDER — SODIUM CHLORIDE 0.9% FLUSH
10.0000 mL | INTRAVENOUS | Status: DC | PRN
  Administered 2015-11-10: 10 mL via INTRAVENOUS
  Filled 2015-11-10: qty 10

## 2015-11-10 MED ORDER — SODIUM CHLORIDE 0.9% FLUSH: 10.0000 mL | INTRAVENOUS | Status: DC | PRN

## 2015-11-10 NOTE — Patient Instructions (Signed)
[place] at [facility] Discharge Instructions  RECOMMENDATIONS MADE BY THE CONSULTANT AND ANY TEST RESULTS WILL BE SENT TO YOUR REFERRING PHYSICIAN.  Exam done and seen today by Kirby Crigler Labs reviewed today, good for treatment. Labs in 3 weeks Return to see the Doctor in 3 weeks Call the clinic for any concerns or questions.   Thank you for choosing [place] at [facility] to provide your oncology and hematology care.  To afford each patient quality time with our provider, please arrive at least 15 minutes before your scheduled appointment time.   Beginning January 23rd 2017 lab work for the [name] will be done in the  Main lab at [facility] on 1st floor. If you have a lab appointment with the [facility] please come in thru the  Main Entrance and check in at the main information desk  You need to re-schedule your appointment should you arrive 10 or more minutes late.  We strive to give you quality time with our providers, and arriving late affects you and other patients whose appointments are after yours.  Also, if you no show three or more times for appointments you may be dismissed from the clinic at the providers discretion.     Again, thank you for choosing [facility].  Our hope is that these requests will decrease the amount of time that you wait before being seen by our physicians.       _____________________________________________________________  Should you have questions after your visit to [facility], please contact our office at [phone] between the hours of 8:30 a.m. and 4:30 p.m.  Voicemails left after 4:30 p.m. will not be returned until the following business day.  For prescription refill requests, have your pharmacy contact our office.         Resources For Cancer Patients and their Caregivers ? American Cancer Society: Can assist with transportation, wigs, general needs,  runs Look Good Feel Better.        507-116-1674 ? Cancer Care: Provides financial assistance, online support groups, medication/co-pay assistance.  1-800-813-HOPE 424-361-0905) ? [facility] Assists [place] cancer patients and their families through emotional , educational and financial support.  (213)628-4093 ? [place] DSS Where to apply for food stamps, Medicaid and utility assistance. 831-174-9991 ? RCATS: Transportation to medical appointments. (512)507-4567 ? Social Security Administration: May apply for disability if have a Stage IV cancer. 276-784-4416 707-374-4163 ? [employer], Disability and Transit Services: Assists with nutrition, care and transit needs. [place] Support Programs: @10RELATIVEDAYS @ > Cancer Support Group  2nd Tuesday of the month 1pm-2pm, Journey Room  > Creative Journey  3rd Tuesday of the month 1130am-1pm, Journey Room  > Look Good Feel Better  1st Wednesday of the month 10am-12 noon, Journey Room (Call [facility] to register [phone])

## 2015-11-10 NOTE — Progress Notes (Signed)
See chemo encounter 

## 2015-11-10 NOTE — Assessment & Plan Note (Addendum)
Recurrent metastatic, high grade urothelial cancer having completed 6 cycles of Carboplatin/Gemcitabine on 07/21/2015 excellent response to therapy but unfortunately, recurrence/progression of disease on imaging on 10/11/2015.  Now on immunotherapy consisting of Keytruda beginning on 10/20/2015.  Oncology history is up to date.  Labs today: CBC diff, CMET, TSH.  I personally reviewed and went over laboratory results with the patient.  The results are noted within this dictation.  Labs today are very stable and meet treatment parameters today.  TSH is pending at this time.  He notes a decrease in appetite.  His weight overall is stable.  He is up 4 lbs compared to 10/30/2015, but down 2 lbs compared to the start of [place] treatment on 10/20/2015.  We will continue to monitor weight moving forward.  Continue anticoagulation with Xarelto for PE.  Continue Xgeva for bone metastases.  Next injection is due in ~ 2 weeks.  Oncology Flowsheet 10/27/2015  denosumab (XGEVA) [place] 120 mg   Labs in 3 weeks: CBC diff, CMET.  He is planning a trip to the [place] of [place] the first week in July.  He is free to go from an oncology perspective.  He is educated on symptoms to be on the look-out for and reminded to take his corticosteroid RX with him.  Return in 3 weeks for follow-up and cycle #3 of treatment.

## 2015-11-10 NOTE — Patient Instructions (Signed)
[facility] Discharge Instructions for Patients Receiving Chemotherapy   Beginning January 23rd 2017 lab work for the Cancer Center will be done in the  Main lab at [facility] on 1st floor. If you have a lab appointment with the Cancer Center please come in thru the  Main Entrance and check in at the main information desk   Today you received the following chemotherapy agents:  Keytruda  If you develop nausea and vomiting, or diarrhea that is not controlled by your medication, call the clinic.  The clinic phone number is [phone]. Office hours are Monday-Friday 8:30am-5:00pm.  BELOW ARE SYMPTOMS THAT SHOULD BE REPORTED IMMEDIATELY:  *FEVER GREATER THAN 101.0 F  *CHILLS WITH OR WITHOUT FEVER  NAUSEA AND VOMITING THAT IS NOT CONTROLLED WITH YOUR NAUSEA MEDICATION  *UNUSUAL SHORTNESS OF BREATH  *UNUSUAL BRUISING OR BLEEDING  TENDERNESS IN MOUTH AND THROAT WITH OR WITHOUT PRESENCE OF ULCERS  *URINARY PROBLEMS  *BOWEL PROBLEMS  UNUSUAL RASH Items with * indicate a potential emergency and should be followed up as soon as possible. If you have an emergency after office hours please contact your primary care physician or go to the nearest emergency department.  Please call the clinic during office hours if you have any questions or concerns.   You may also contact the Patient Navigator at [phone] should you have any questions or need assistance in obtaining follow up care.      Resources For Cancer Patients and their Caregivers ? American Cancer Society: Can assist with transportation, wigs, general needs, runs Look Good Feel Better.        1-888-227-6333 ? Cancer Care: Provides financial assistance, online support groups, medication/co-pay assistance.  1-800-813-HOPE (4673) ? [facility] Assists [place] cancer patients and their families through emotional , educational and financial support.   336-427-4357 ? [place] DSS Where to apply for food stamps, Medicaid and utility assistance. 336-342-1394 ? RCATS: Transportation to medical appointments. 336-347-2287 ? Social Security Administration: May apply for disability if have a Stage IV cancer. 336-342-7796 1-800-772-1213 ? [place] Aging, Disability and Transit Services: Assists with nutrition, care and transit needs. 336-349-2343         

## 2015-11-10 NOTE — Progress Notes (Signed)
Tolerated tx w/o adverse reaction.  A&Ox4, in no distress.  VSS.  Discharged ambulatory.  

## 2015-11-10 NOTE — Progress Notes (Signed)
[address] [place] [address]  Metastatic urothelial carcinoma [facility]) - Plan: pembrolizumab (KEYTRUDA) 200 mg in sodium chloride 0.9 % 50 mL chemo infusion, 0.9 %  sodium chloride infusion, heparin lock flush 100 unit/mL, sodium chloride flush (NS) 0.9 % injection 10 mL, TREATMENT CONDITIONS  Metastatic urothelial carcinoma (HCC) - Plan: pembrolizumab (KEYTRUDA) 200 mg in sodium chloride 0.9 % 50 mL chemo infusion, 0.9 %  sodium chloride infusion, heparin lock flush 100 unit/mL, sodium chloride flush (NS) 0.9 % injection 10 mL, TREATMENT CONDITIONS  CURRENT THERAPY: Keytruda every 21 days beginning on 10/20/2015  INTERVAL HISTORY: Harry Franklin 75 y.o. male returns for followup of Recurrent metastatic, high grade urothelial cancer having completed 6 cycles of Carboplatin/Gemcitabine on 07/21/2015 excellent response to therapy but unfortunately, recurrence/progression of disease on imaging on 10/11/2015    Metastatic urothelial carcinoma ([place])   04/02/2015 Imaging CT abd/pelvis- Innumberable hepatic masses, B/L adrenal mets, abdominopelvic lymphadenopathy, LLL lung lesion 6 mm, Lytic lesion of right L2 vertebral bosy, circumferential wall thickening of lower thoracic esophagus, mild diffuse bladder wall thickening.   04/02/2015 Pathology Results Urine Cytology- cells present suspicious for malignancy.  Suspicious for high-grade urothelial carcinoma.   04/08/2015 Procedure US guided biopsy of hepatic lesion   04/08/2015 Pathology Results Liver, needle/core biopsy, right - METASTATIC HIGH GRADE CARCINOMA,   04/14/2015 - 07/21/2015 Chemotherapy Carboplatin/Gemcitabine Days 1, 8 every 21 days   05/25/2015 Imaging CT with interval response to therapy, improvement in multifocal liver mets, improvement in enlarged abdominal and pelvic adenopahty and bilateral adrenal gland metastatese, stable appearance of lytic lesion in L2   06/02/2015 Miscellaneous Xgeva  to reduce the risk of SLE.  120 mg SQ every month.   06/08/2015 - 06/11/2015 Hospital Admission NSTEMI, Afib with RVR, Hypertension, Chemotherapy induced pancyopenia   06/28/2015 Treatment Plan Change Carboplatin dose reduced to AUC of 4 and Gemcitabine dose reduced by 20%   08/02/2015 - 08/09/2015 Hospital Admission Respiratory Arrest, acute bilateral DVT, presumed PE. IVC filter placed secondary to progressive anemia on anticoagulation   08/10/2015 Imaging CT CAP- Significant interval treatment response.   10/11/2015 Imaging CT CAP- progression of metastatic disease in the liver, with enlargement of the previously noted index lesion in segment 4B, and 2 new lesions adjacent to the gallbladder fossa, as detailed above.    10/11/2015 Progression CT scan demonstrates progression of disease.   10/20/2015 -  Chemotherapy Keytruda   Tolerated his first cycle of chemotherapy well. He is provided education on the mechanism action of immunotherapy in this is compared to his previous cytotoxic chemotherapy. He and his family members are appreciative of the information. Reviewed symptoms to be on the lookout for associated with immunotherapy. He does have corticosteroids at home but can be used when necessary. He is advised to call us if he starts corticosteroid treatment.  He denies any nausea or vomiting. He denies any changes in his bowels. He denies any severe fatigue.  He admits to appetite changes but no documented weight loss at this time. We will continue to follow closely. He is educated on supplements that he can utilize.  Review of Systems  Constitutional: Negative.  Negative for fever, chills and weight loss.  HENT: Negative.   Eyes: Negative.  Negative for blurred vision, double vision, photophobia and pain.  Respiratory: Negative.  Negative for cough, hemoptysis, shortness of breath and wheezing.   Cardiovascular: Negative.  Negative for chest pain,  palpitations and leg swelling.  Gastrointestinal:  Negative.  Negative for nausea, vomiting, abdominal pain, diarrhea, constipation, blood in stool and melena.  Genitourinary: Negative.  Negative for dysuria and frequency.  Musculoskeletal: Negative.   Skin: Negative.  Negative for itching and rash.  Neurological: Negative.  Negative for weakness.  Endo/Heme/Allergies: Negative.   Psychiatric/Behavioral: Negative.     Past Medical History  Diagnosis Date  . GERD (gastroesophageal reflux disease)   . Hypokalemia   . Hypertension   . Cataract   . Arthritis   . Macular degeneration, age related   . DVT (deep venous thrombosis) ([place])     developed after traveling x 1, developed another after left foot surgery  . Hydrocele in adult   . Metastatic urothelial carcinoma ([place]) 04/12/2015  . Bone metastases ([place]) 09/21/2015    Past Surgical History  Procedure Laterality Date  . Replacement total knee    . Appendectomy    . Hammer toe surgery Left   . Lapidus procedure    . Hallux fusion    . Colonoscopy  03/31/05  . Colonoscopy N/A 10/02/2014    [id number] diverticulosis  . Esophagogastroduodenoscopy N/A 10/02/2014    [id number] ulcerative reflux/s/p dilation/large HH  . [place] dilation N/A 10/02/2014    Procedure: Venia Minks DILATION;  Surgeon: Daneil Dolin, MD;  [facility];  Service: Endoscopy;  Laterality: N/A;  . Esophagogastroduodenoscopy N/A 01/29/2015    Procedure: ESOPHAGOGASTRODUODENOSCOPY (EGD);  Surgeon: Daneil Dolin, MD;  [facility];  Service: Endoscopy;  Laterality: N/A;  0800-rescheduled to 9/9 @ 915 C[place] notified pt  . Esophageal dilation  01/29/2015    Procedure: ESOPHAGEAL DILATION;  Surgeon: Daneil Dolin, MD;  [facility];  Service: Endoscopy;;  . Portacath placement Left 04/13/15    Family History  Problem Relation Age of Onset  . Colon cancer Maternal Uncle   . Heart disease Father     Social History   Social History  . Marital Status: Married    Spouse Name: N/A  .  Number of Children: N/A  . Years of Education: N/A   Social History Main Topics  . Smoking status: Former Smoker -- 1.00 packs/day for 31 years    Types: Cigarettes    Quit date: 01/22/1984  . Smokeless tobacco: Never Used     Comment: qUIT IN 1985  . Alcohol Use: No  . Drug Use: No  . Sexual Activity: Not on file   Other Topics Concern  . Not on file   Social History Narrative     PHYSICAL EXAMINATION  ECOG PERFORMANCE STATUS: 1 - Symptomatic but completely ambulatory  Filed Vitals:   11/10/15 1330  BP: 128/64  Pulse: 74  Temp: 97.8 F (36.6 C)  Resp: 20    GENERAL:alert, no distress, well nourished, well developed, comfortable, cooperative, smiling and in chemo-recliner, accompanied by family members. SKIN: skin color, texture, turgor are normal, no rashes or significant lesions HEAD: Normocephalic, No masses, lesions, tenderness or abnormalities EYES: normal, Conjunctiva are pink and non-injected EARS: External ears normal OROPHARYNX:lips, buccal mucosa, and tongue normal and mucous membranes are moist  NECK: supple, trachea midline LYMPH:  not examined BREAST:not examined LUNGS: clear to auscultation and percussion HEART: regular rate & rhythm, no murmurs, no gallops, S1 normal and S2 normal ABDOMEN:abdomen soft, non-tender and normal bowel sounds BACK: Back symmetric, no curvature. EXTREMITIES:less then 2 second capillary refill, no joint deformities, effusion, or inflammation, no skin discoloration, no cyanosis  NEURO:  alert & oriented x 3 with fluent speech, no focal motor/sensory deficits, gait normal   LABORATORY DATA: CBC    Component Value Date/Time   WBC 5.7 11/10/2015 1305   RBC 3.32* 11/10/2015 1305   HGB 10.7* 11/10/2015 1305   HCT 32.7* 11/10/2015 1305   PLT 213 11/10/2015 1305   MCV 98.5 11/10/2015 1305   MCH 32.2 11/10/2015 1305   MCHC 32.7 11/10/2015 1305   RDW 14.0 11/10/2015 1305   LYMPHSABS 1.3 11/10/2015 1305   MONOABS 0.6  11/10/2015 1305   EOSABS 0.2 11/10/2015 1305   BASOSABS 0.0 11/10/2015 1305      Chemistry      Component Value Date/Time   NA 133* 11/10/2015 1305   K 3.9 11/10/2015 1305   CL 100* 11/10/2015 1305   CO2 28 11/10/2015 1305   BUN 23* 11/10/2015 1305   CREATININE 1.43* 11/10/2015 1305      Component Value Date/Time   CALCIUM 8.5* 11/10/2015 1305   ALKPHOS 155* 11/10/2015 1305   AST 50* 11/10/2015 1305   ALT 42 11/10/2015 1305   BILITOT 1.0 11/10/2015 1305        PENDING LABS:   RADIOGRAPHIC STUDIES:  Dg Chest 2 View  10/31/2015  CLINICAL DATA:  76 year old male with fever. Metastatic urethral carcinoma. [place] therapy. EXAM: CHEST  2 VIEW COMPARISON:  CT of the chest abdomen pelvis dated 10/11/2015 FINDINGS: Two views of the chest demonstrate mild diffuse interstitial coarsening, likely chronic. There is no focal consolidation, pleural effusion, or pneumothorax. Right apical pleural with pleural parenchymal scarring. Right pectoral Port-A-Cath with tip over the central SVC. The cardiac silhouette is within normal limits. There is osteopenia with degenerative changes of the spine. No acute fracture. IMPRESSION: No acute cardiopulmonary process. Electronically Signed   By: Anner Crete M.D.   On: 10/31/2015 01:41     PATHOLOGY:    ASSESSMENT AND PLAN:  Metastatic urothelial carcinoma (HCC) Recurrent metastatic, high grade urothelial cancer having completed 6 cycles of Carboplatin/Gemcitabine on 07/21/2015 excellent response to therapy but unfortunately, recurrence/progression of disease on imaging on 10/11/2015.  Now on immunotherapy consisting of Keytruda beginning on 10/20/2015.  Oncology history is up to date.  Labs today: CBC diff, CMET, TSH.  I personally reviewed and went over laboratory results with the patient.  The results are noted within this dictation.  Labs today are very stable and meet treatment parameters today.  TSH is pending at this time.  He notes a  decrease in appetite.  His weight overall is stable.  He is up 4 lbs compared to 10/30/2015, but down 2 lbs compared to the start of [place] treatment on 10/20/2015.  We will continue to monitor weight moving forward.  Continue anticoagulation with Xarelto for PE.  Continue Xgeva for bone metastases.  Next injection is due in ~ 2 weeks.  Oncology Flowsheet 10/27/2015  denosumab (XGEVA) [place] 120 mg   Labs in 3 weeks: CBC diff, CMET.  He is planning a trip to the [place] the first week in July.  He is free to go from an oncology perspective.  He is educated on symptoms to be on the look-out for and reminded to take his corticosteroid RX with him.  Return in 3 weeks for follow-up and cycle #3 of treatment.    ORDERS PLACED FOR THIS ENCOUNTER: Orders Placed This Encounter  Procedures  . TREATMENT CONDITIONS    MEDICATIONS PRESCRIBED THIS ENCOUNTER: Meds ordered this encounter  Medications  . DISCONTD:  Calcium Carbonate Antacid 600 MG chewable tablet    Sig:   . atorvastatin (LIPITOR) 80 MG tablet    Sig: Take 80 mg by mouth.   . carvedilol (COREG) 12.5 MG tablet    Sig: Take 12.5 mg by mouth 2 (two) times daily with a meal.   . furosemide (LASIX) 40 MG tablet    Sig: Take 40 mg by mouth daily.   Marland Kitchen lisinopril (PRINIVIL,ZESTRIL) 2.5 MG tablet    Sig: Take 2.5 mg by mouth daily.   Marland Kitchen omeprazole (PRILOSEC) 40 MG capsule    Sig: Take 40 mg by mouth daily.   . potassium chloride (KLOR-CON) 8 MEQ tablet    Sig: Take 8 mEq by mouth 2 (two) times daily.   . predniSONE (DELTASONE) 20 MG tablet    Sig: Take 20 mg by mouth.   Alveda Reasons 20 MG TABS tablet    Sig: Take 20 mg by mouth daily with supper.   . pembrolizumab (KEYTRUDA) 200 mg in sodium chloride 0.9 % 50 mL chemo infusion    Sig:   . 0.9 %  sodium chloride infusion    Sig:   . heparin lock flush 100 unit/mL    Sig:   . sodium chloride flush (NS) 0.9 % injection 10 mL    Sig:   . acetaminophen (TYLENOL) 325 MG tablet     Sig: Take 650 mg by mouth every 6 (six) hours as needed for mild pain.  . Calcium Carb-Cholecalciferol 500-600 MG-UNIT TABS    Sig: Take 500-600 mg by mouth 1 day or 1 dose.  . Wheat Dextrin (BENEFIBER DRINK MIX PO)    Sig: Take by mouth.  . diphenhydrAMINE (SOMINEX) 25 MG tablet    Sig: Take 25 mg by mouth at bedtime as needed for sleep.    THERAPY PLAN:  Continue treatment as planned.  All questions were answered. The patient knows to call the clinic with any problems, questions or concerns. We can certainly see the patient much sooner if necessary.  Patient and plan discussed with Dr. Ancil Linsey and she is in agreement with the aforementioned.   This note is electronically signed by: Doy Mince 11/10/2015 2:19 PM

## 2015-11-11 ENCOUNTER — Ambulatory Visit (INDEPENDENT_AMBULATORY_CARE_PROVIDER_SITE_OTHER): Payer: Medicare HMO | Admitting: Cardiology

## 2015-11-11 ENCOUNTER — Encounter: Payer: Self-pay | Admitting: Cardiology

## 2015-11-11 VITALS — BP 124/68 | HR 89 | Ht 76.0 in | Wt 262.0 lb

## 2015-11-11 DIAGNOSIS — I2510 Atherosclerotic heart disease of native coronary artery without angina pectoris: Secondary | ICD-10-CM | POA: Diagnosis not present

## 2015-11-11 DIAGNOSIS — I480 Paroxysmal atrial fibrillation: Secondary | ICD-10-CM

## 2015-11-11 DIAGNOSIS — I5022 Chronic systolic (congestive) heart failure: Secondary | ICD-10-CM | POA: Diagnosis not present

## 2015-11-11 MED ORDER — LISINOPRIL 2.5 MG PO TABS: 2.5000 mg | ORAL_TABLET | Freq: Every day | ORAL | Status: AC

## 2015-11-11 MED ORDER — ATORVASTATIN CALCIUM 80 MG PO TABS: 80.0000 mg | ORAL_TABLET | Freq: Every day | ORAL | Status: AC

## 2015-11-11 NOTE — Patient Instructions (Addendum)
Medication Instructions:  Your physician recommends that you continue on your current medications as directed. Please refer to the Current Medication list given to you today.   Labwork: NONE  Testing/Procedures: NONE  Follow-Up: Your physician recommends that you schedule a follow-up appointment in: 6 WEEKS    Any Other Special Instructions Will Be Listed Below (If Applicable).   SAMPLES GIVEN OF XARELTO 20 MG   If you need a refill on your cardiac medications before your next appointment, please call your pharmacy.

## 2015-11-11 NOTE — Progress Notes (Addendum)
Clinical Summary Mr. Emanuelson is a 75 y.o.male  seen today for follow up of the following medical problems.   1. CAD - admit Jan 2017 with NSTEMI with peak trop 8.68, no EKG changes. Echo with LVEF 40-45% with WMAs primarily involving the apex. No cardiopulmonary symptoms. 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 Given his diffusely metastatic bladder cancer and severe pancytopenia due to recent chemotherapy he was managed medically.  - follow up [place] nuclear stres test on 07/06/15 that showed prior myocardial infarction with minimal peri-infarct ischemia in the apex and in the septal/inferoseptal/inferior wall territories  - denies any chest pain. Stable SOB/DOE. He did not tolerate high dose of coreg due to dizziness and hypotension   2. Afib - new diagnosis during recent admit, was not anticoagulated at that time due to severe pancytopenia and metastatic CA.  - recently blood counts have normalized off chemo, with recent diagnosis of DVT/PE as well he has been started on xarelto  - denies any palpitations. No bleeding issues on xarelto   3. 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 Has upcoming CT scan later this month - CT 10/11/15 shows progression of metatstic disease including the liver and possibly gallbladder.     4. DVT/Presumed PE - recent admit with SOB. Patient with PEA arrest. Found to have DVT, presumed possible PE as etiology of arrest.  - IVC filter placed 07/2015, removed 09/2015 - remains on xarelto   5. Chronic sysotlic HF - [id number] LVEF down to 25-30%. Had been 40-45% by echo 06/2015 - diuresed 7 liters during recent admission with 13 lbs weight loss. Discharge weight 267 lbs.     - last visit we increased coreg to 25mg  bid - coreg 25mg  bid caused dizziness, and hypotension. Back on 12.5mg  bid.  - has appt with cardiac rehab July - stable SOB/DOE. No recent LE edema. Chronic right leg swelling unchanged    Past Medical  History  Diagnosis Date  . GERD (gastroesophageal reflux disease)   . Hypokalemia   . Hypertension   . Cataract   . Arthritis   . Macular degeneration, age related   . DVT (deep venous thrombosis) ([place])     developed after traveling x 1, developed another after left foot surgery  . Hydrocele in adult   . Metastatic urothelial carcinoma ([place]) 04/12/2015  . Bone metastases ([place]) 09/21/2015     Allergies  Allergen Reactions  . Neulasta [Pegfilgrastim] Hives    Pt reports breaking out in hives less than 24 hours after taking neulasta. Hives appear on arms and legs.  . Rabeprazole     Other reaction(s): DIARRHEA  . Vancomycin Hives and Rash     Current Outpatient Prescriptions  Medication Sig Dispense Refill  . acetaminophen (TYLENOL) 325 MG tablet Take 650 mg by mouth every 6 (six) hours as needed for mild pain.    Marland Kitchen atorvastatin (LIPITOR) 80 MG tablet Take 80 mg by mouth.     . Calcium Carb-Cholecalciferol 500-600 MG-UNIT TABS Take 500-600 mg by mouth 1 day or 1 dose.    . carvedilol (COREG) 12.5 MG tablet Take 12.5 mg by mouth 2 (two) times daily with a meal.     . diphenhydrAMINE (SOMINEX) 25 MG tablet Take 25 mg by mouth at bedtime as needed for sleep.    . furosemide (LASIX) 40 MG tablet Take 40 mg by mouth daily.     Marland Kitchen lisinopril (PRINIVIL,ZESTRIL) 2.5 MG tablet Take  2.5 mg by mouth daily.     Marland Kitchen omeprazole (PRILOSEC) 40 MG capsule Take 40 mg by mouth daily.     . potassium chloride (KLOR-CON) 8 MEQ tablet Take 8 mEq by mouth 2 (two) times daily.     . predniSONE (DELTASONE) 20 MG tablet Take 20 mg by mouth.     . Wheat Dextrin (BENEFIBER DRINK MIX PO) Take by mouth.    Alveda Reasons 20 MG TABS tablet Take 20 mg by mouth daily with supper.      No current facility-administered medications for this visit.     Past Surgical History  Procedure Laterality Date  . Replacement total knee    . Appendectomy    . Hammer toe surgery Left   . Lapidus procedure    . Hallux fusion      . Colonoscopy  03/31/05  . Colonoscopy N/A 10/02/2014    [id number] diverticulosis  . Esophagogastroduodenoscopy N/A 10/02/2014    [id number] ulcerative reflux/s/p dilation/large HH  . [place] dilation N/A 10/02/2014    Procedure: Venia Minks DILATION;  Surgeon: Daneil Dolin, MD;  [facility];  Service: Endoscopy;  Laterality: N/A;  . Esophagogastroduodenoscopy N/A 01/29/2015    Procedure: ESOPHAGOGASTRODUODENOSCOPY (EGD);  Surgeon: Daneil Dolin, MD;  [facility];  Service: Endoscopy;  Laterality: N/A;  0800-rescheduled to 9/9 @ 915 Candy notified pt  . Esophageal dilation  01/29/2015    Procedure: ESOPHAGEAL DILATION;  Surgeon: Daneil Dolin, MD;  [facility];  Service: Endoscopy;;  . Portacath placement Left 04/13/15     Allergies  Allergen Reactions  . Neulasta [Pegfilgrastim] Hives    Pt reports breaking out in hives less than 24 hours after taking neulasta. Hives appear on arms and legs.  . Rabeprazole     Other reaction(s): DIARRHEA  . Vancomycin Hives and Rash      Family History  Problem Relation Age of Onset  . Colon cancer Maternal Uncle   . Heart disease Father      Social History Mr. Laramore reports that he quit smoking about 31 years ago. His smoking use included Cigarettes. He has a 31 pack-year smoking history. He has never used smokeless tobacco. Mr. Hoglund reports that he does not drink alcohol.   Review of Systems CONSTITUTIONAL: No weight loss, fever, chills, weakness or fatigue.  HEENT: Eyes: No visual loss, blurred vision, double vision or yellow sclerae.No hearing loss, sneezing, congestion, runny nose or sore throat.  SKIN: No rash or itching.  CARDIOVASCULAR: per HPI RESPIRATORY: No shortness of breath, cough or sputum.  GASTROINTESTINAL: No anorexia, nausea, vomiting or diarrhea. No abdominal pain or blood.  GENITOURINARY: No burning on urination, no polyuria NEUROLOGICAL: No headache, dizziness, syncope,  paralysis, ataxia, numbness or tingling in the extremities. No change in bowel or bladder control.  MUSCULOSKELETAL: No muscle, back pain, joint pain or stiffness.  LYMPHATICS: No enlarged nodes. No history of splenectomy.  PSYCHIATRIC: No history of depression or anxiety.  ENDOCRINOLOGIC: No reports of sweating, cold or heat intolerance. No polyuria or polydipsia.  Marland Kitchen   Physical Examination Filed Vitals:   11/11/15 1314  BP: 124/68  Pulse: 89   Filed Vitals:   11/11/15 1314  Height: 6\' 4"  (1.93 m)  Weight: 262 lb (118.842 kg)    Gen: resting comfortably, no acute distress HEENT: no scleral icterus, pupils equal round and reactive, no palptable cervical adenopathy,  CV: RRR, no m/r/g, no jvd Resp: Clear to auscultation bilaterally GI:  abdomen is soft, non-tender, non-distended, normal bowel sounds, no hepatosplenomegaly MSK: extremities are warm, trace bilateral LE edema Skin: warm, no rash Neuro:  no focal deficits Psych: appropriate affect   Diagnostic Studies 06/2015 echo - Left ventricle: The cavity size was at the upper limits of normal. Wall thickness was at the upper limits of normal. Systolic function was mildly to moderately reduced. The estimated ejection fraction was in the range of 40% to 45%. There is severe hypokinesis of the mid-apicalanteroseptal, anterior, inferior, and apical myocardium. Features are consistent with a pseudonormal left ventricular filling pattern, with concomitant abnormal relaxation and increased filling pressure (grade 2 diastolic dysfunction). - Aortic valve: Mildly calcified annulus. Trileaflet; mildly calcified leaflets. - Mitral valve: There was mild regurgitation. - Left atrium: The atrium was mildly dilated. - Right atrium: Central venous pressure (est): 3 mm Hg. - Tricuspid valve: There was trivial regurgitation. - Pulmonary arteries: PA peak pressure: 19 mm Hg (S). - Pericardium, extracardiac: There was no  pericardial effusion.  Impressions:  - Upper normal LV wall thickness and chamber size with LVEF approximately 40-45%. No significant improvement noted in wall motion abnormalities described above in comparison to the recent study. Grade 2 diastolic dysfunction with increased filling pressures. Mild left atrial enlargement. Mild mitral regurgitation. Sclerotic aortic valve without stenosis. Trivial tricuspid regurgitation with PASP 19 mmHg.   Jan 2017 echo Study Conclusions  - Left ventricle: Systolic function was mildly to moderately reduced. The estimated ejection fraction was in the range of 40% to 45%. - Regional wall motion abnormality: Hypokinesis of the mid-apical anterior, basal-mid anteroseptal, mid anterolateral, apical septal, apical lateral, and apical myocardium. - Limited study with echocontrast to evaluate LV function and wall motion.  06/2015 Lexiscan MPI  There was no ST segment deviation noted during stress.  Findings consistent with prior myocardial infarction with peri-infarct ischemia in the apex and in the septal/inferoseptal/inferior wall territories. Both defects are primarily scar with fairly mild peri-infarct ischemia.  This is a high risk study. High risk based on low ejection fraction and multiple defects. There is a fairly minimal amount of myocardium currently at jeopardy.  The left ventricular ejection fraction is moderately decreased (30-44%).    07/2015 [place] Summary:  - Findings consistent with acute deep vein thrombosis involving the  right lower extremity. - Findings consistent with acute deep vein thrombosis involving the  left lower extremity. - No evidence of Baker&'s cyst on the right or left.  07/2015 echo  Study Conclusions  - Left ventricle: The cavity size was normal. There was mild  concentric hypertrophy with moderate focal basal hypertrophy.  Systolic function was severely reduced. The  estimated ejection  fraction was in the range of 25% to 30%. There is akinesis of the  mid-apicalanteroseptal and inferoseptal myocardium. The study is  not technically sufficient to allow evaluation of LV diastolic  function. - Aortic valve: Trileaflet; mildly thickened, mildly calcified  leaflets. - Left atrium: The atrium was mildly dilated.    Assessment and Plan  1. CAD - continue medical therapy. We have not pursued cath due to his metastatic bladder cancer and recent pancytopenia on chemotherapy. Recent CT scan shows cancer progressoin - pending progress of his cancer and his blood counts consider cath in the future.  - we will continue current meds.   2. Afib - no recent symptoms, we will continue current meds including xarelto. CHADS2Vasc score of 4  3. DVT/PE - management per heme/onc   4. Chronic systolic HF -  did not tolerate higher doses of coreg. Will continue current regimen at this time - no current symptoms.    F/u 6 weeks.       Arnoldo Lenis, M.D.

## 2015-11-25 ENCOUNTER — Other Ambulatory Visit (HOSPITAL_COMMUNITY): Payer: Medicare HMO

## 2015-11-25 ENCOUNTER — Ambulatory Visit (HOSPITAL_COMMUNITY): Payer: Medicare HMO

## 2015-11-30 NOTE — Progress Notes (Signed)
[name] [place] [address]  Metastatic urothelial carcinoma [facility]) - Plan: CT Abdomen Pelvis W Contrast, CBC with Differential, TSH, CBC with Differential, TSH, CANCELED: CT Chest W Contrast  Dyspnea - Plan: CT ANGIO CHEST PE W OR WO CONTRAST, CANCELED: EKG 12-Lead  CURRENT THERAPY: Keytruda every 21 days beginning on 10/20/2015  INTERVAL HISTORY: Harry Franklin 75 y.o. male returns for followup of Recurrent metastatic, high grade urothelial cancer having completed 6 cycles of Carboplatin/Gemcitabine on 07/21/2015 excellent response to therapy but unfortunately, recurrence/progression of disease on imaging on 10/11/2015    Metastatic urothelial carcinoma ([place])   04/02/2015 Imaging CT abd/pelvis- Innumberable hepatic masses, B/L adrenal mets, abdominopelvic lymphadenopathy, LLL lung lesion 6 mm, Lytic lesion of right L2 vertebral bosy, circumferential wall thickening of lower thoracic esophagus, mild diffuse bladder wall thickening.   04/02/2015 Pathology Results Urine Cytology- cells present suspicious for malignancy.  Suspicious for high-grade urothelial carcinoma.   04/08/2015 Procedure US guided biopsy of hepatic lesion   04/08/2015 Pathology Results Liver, needle/core biopsy, right - METASTATIC HIGH GRADE CARCINOMA,   04/14/2015 - 07/21/2015 Chemotherapy Carboplatin/Gemcitabine Days 1, 8 every 21 days   05/25/2015 Imaging CT with interval response to therapy, improvement in multifocal liver mets, improvement in enlarged abdominal and pelvic adenopahty and bilateral adrenal gland metastatese, stable appearance of lytic lesion in L2   06/02/2015 Miscellaneous Xgeva to reduce the risk of SLE.  120 mg SQ every month.   06/08/2015 - 06/11/2015 Hospital Admission NSTEMI, Afib with RVR, Hypertension, Chemotherapy induced pancyopenia   06/28/2015 Treatment Plan Change Carboplatin dose reduced to AUC of 4 and Gemcitabine dose reduced by 20%   08/02/2015 -  08/09/2015 Hospital Admission Respiratory Arrest, acute bilateral DVT, presumed PE. IVC filter placed secondary to progressive anemia on anticoagulation   08/10/2015 Imaging CT CAP- Significant interval treatment response.   10/11/2015 Imaging CT CAP- progression of metastatic disease in the liver, with enlargement of the previously noted index lesion in segment 4B, and 2 new lesions adjacent to the gallbladder fossa, as detailed above.    10/11/2015 Progression CT scan demonstrates progression of disease.   10/20/2015 -  Chemotherapy Keytruda   12/01/2015 Imaging CT angio chest- No evidence of pulmonary embolus.  Partial visualization of the liver demonstrates enlarging hepatic lesions/metastases.   12/01/2015 Treatment Plan Change Keytruda deferred x 7 days   "I just don't feel good."  He went on a small vacation recently, but this was complicated by a UTI requiring hospitalization at an outside facility.  I do not have those records readily available.  He reports that he was given IV antibiotic(s) and is currently finishing a course of PO antibiotic, he is due to complete the course on Saturday.  He reports intermittent fevers that come and go.  He notes episodes increased temperatures that resolve.    He notes a sudden onset of SOB today.  "I don't know what is wrong with me."  He denies any chest pain.  He denies any hemoptysis or cough.  He reports that he had to stop "multiple times" while making his way to the clinic from the parking lot.  Family members report that the front desk staff on the first floor offered him a wheelchair multiple times (which he refused).  "I felt great yesterday.  I don't know what is wrong with me today."  Review of Systems  Constitutional: Positive for fever (Recent hospitalization at another  facility with fevers up to 103 F) and chills. Negative for weight loss.  HENT: Negative.   Eyes: Negative.  Negative for blurred vision, double vision, photophobia and pain.    Respiratory: Positive for shortness of breath (SUDDEN ONSET, BEGINNING TODAY). Negative for cough, hemoptysis and wheezing.   Cardiovascular: Positive for leg swelling (Chronic right LE edema secondary to pass surgery, nontender). Negative for chest pain and palpitations.  Gastrointestinal: Negative.  Negative for nausea, vomiting, abdominal pain, diarrhea, constipation, blood in stool and melena.  Genitourinary: Negative.  Negative for dysuria and frequency.  Musculoskeletal: Negative.   Skin: Negative.  Negative for itching and rash.  Neurological: Negative.  Negative for weakness.  Endo/Heme/Allergies: Negative.   Psychiatric/Behavioral: Negative.     Past Medical History  Diagnosis Date  . GERD (gastroesophageal reflux disease)   . Hypokalemia   . Hypertension   . Cataract   . Arthritis   . Macular degeneration, age related   . DVT (deep venous thrombosis) ([place])     developed after traveling x 1, developed another after left foot surgery  . Hydrocele in adult   . Metastatic urothelial carcinoma ([place]) 04/12/2015  . Bone metastases ([place]) 09/21/2015    Past Surgical History  Procedure Laterality Date  . Replacement total knee    . Appendectomy    . Hammer toe surgery Left   . Lapidus procedure    . Hallux fusion    . Colonoscopy  03/31/05  . Colonoscopy N/A 10/02/2014    [id number] diverticulosis  . Esophagogastroduodenoscopy N/A 10/02/2014    [id number] ulcerative reflux/s/p dilation/large HH  . Maloney dilation N/A 10/02/2014    Procedure: Venia Minks DILATION;  Surgeon: Daneil Dolin, MD;  [facility];  Service: Endoscopy;  Laterality: N/A;  . Esophagogastroduodenoscopy N/A 01/29/2015    Procedure: ESOPHAGOGASTRODUODENOSCOPY (EGD);  Surgeon: Daneil Dolin, MD;  Location: [facility];  Service: Endoscopy;  Laterality: N/A;  0800-rescheduled to 9/9 @ 915 [place] notified pt  . Esophageal dilation  01/29/2015    Procedure: ESOPHAGEAL DILATION;  Surgeon: Daneil Dolin,  MD;  Location: [facility];  Service: Endoscopy;;  . Portacath placement Left 04/13/15    Family History  Problem Relation Age of Onset  . Colon cancer Maternal Uncle   . Heart disease Father     Social History   Social History  . Marital Status: Married    Spouse Name: N/A  . Number of Children: N/A  . Years of Education: N/A   Social History Main Topics  . Smoking status: Former Smoker -- 1.00 packs/day for 31 years    Types: Cigarettes    Quit date: 01/22/1984  . Smokeless tobacco: Never Used     Comment: qUIT IN 1985  . Alcohol Use: No  . Drug Use: No  . Sexual Activity: Not Asked   Other Topics Concern  . None   Social History Narrative     PHYSICAL EXAMINATION  ECOG PERFORMANCE STATUS: 2 - Symptomatic, <50% confined to bed  Filed Vitals:   12/01/15 1101  BP: 120/61  Pulse: 70  Temp: 98.7 F (37.1 C)  Resp: 20    GENERAL:alert, no distress, well nourished, well developed, cooperative, accompanied by typical family members, not looking well, increased SOB on exertion.   SKIN: skin color, texture, turgor are normal, no rashes or significant lesions HEAD: Normocephalic, No masses, lesions, tenderness or abnormalities EYES: normal, Conjunctiva are pink and non-injected EARS: External ears normal OROPHARYNX:lips, buccal mucosa, and  tongue normal and mucous membranes are moist  NECK: supple, trachea midline LYMPH:  not examined BREAST:not examined LUNGS: clear to auscultation and percussion HEART: regular rate & rhythm, no murmurs, no gallops, S1 normal and S2 normal ABDOMEN:abdomen soft, non-tender and normal bowel sounds.  LUQ abdominal fullness without clear findings for hepatomegaly.  Percussion of liver demonstrates dullness 2-3 cm below the costophrenic margin at mid-clavicular line. BACK: Back symmetric, no curvature. EXTREMITIES:less then 2 second capillary refill, no joint deformities, effusion, or inflammation, no skin discoloration, no cyanosis    NEURO: alert & oriented x 3 with fluent speech, no focal motor/sensory deficits, gait normal   LABORATORY DATA: CBC    Component Value Date/Time   WBC 7.0 12/01/2015 1003   RBC 3.09* 12/01/2015 1003   HGB 9.5* 12/01/2015 1003   HCT 29.8* 12/01/2015 1003   PLT 200 12/01/2015 1003   MCV 96.4 12/01/2015 1003   MCH 30.7 12/01/2015 1003   MCHC 31.9 12/01/2015 1003   RDW 14.2 12/01/2015 1003   LYMPHSABS 0.9 12/01/2015 1003   MONOABS 0.9 12/01/2015 1003   EOSABS 0.2 12/01/2015 1003   BASOSABS 0.0 12/01/2015 1003      Chemistry      Component Value Date/Time   NA 134* 12/01/2015 1003   K 4.3 12/01/2015 1003   CL 102 12/01/2015 1003   CO2 25 12/01/2015 1003   BUN 25* 12/01/2015 1003   CREATININE 1.41* 12/01/2015 1003      Component Value Date/Time   CALCIUM 8.4* 12/01/2015 1003   ALKPHOS 232* 12/01/2015 1003   AST 68* 12/01/2015 1003   ALT 62 12/01/2015 1003   BILITOT 0.8 12/01/2015 1003        PENDING LABS:   RADIOGRAPHIC STUDIES:  Ct Angio Chest Pe W Or Wo Contrast  12/01/2015  CLINICAL DATA:  Shortness of breath, sudden onset today. History of stage IV urothelial cancer. EXAM: CT ANGIOGRAPHY CHEST WITH CONTRAST TECHNIQUE: Multidetector CT imaging of the chest was performed using the standard protocol during bolus administration of intravenous contrast. Multiplanar CT image reconstructions and MIPs were obtained to evaluate the vascular anatomy. CONTRAST:  100 cc Isovue 370 IV COMPARISON:  10/11/2015 FINDINGS: Cardiovascular: No filling defects in the pulmonary arteries to suggest pulmonary emboli. Respiratory motion obscures the vessels in the lung apices. Dense diffuse coronary artery calcifications. Heart is borderline in size. Aorta is normal caliber with scattered aortic calcifications. Mediastinum/Nodes: Esophagus is fluid-filled and mildly dilated, possibly related to reflux or dysmotility. No mediastinal, hilar, or axillary adenopathy. Lungs/Pleura: Stable 5 mm  nodule in the right middle lobe. Small right pleural effusion, increased since prior study. Small nodule at the left lung base measures 6 mm and is stable on image 103. No new or enlarging pulmonary nodules. Biapical scarring noted. Upper Abdomen: Enlarging hepatic masses in the visualized liver. Hepatic lesion on image 143 measures 2.7 cm compared with 6 mm previously. Central right hepatic mass is partially imaged, measuring approximately 6.7 cm this measured up to 3.3 cm previously. These hepatic lesions are difficult to visualize given the arterial timing for the CTA chest. The liver is not imaged in its entirety. Left adrenal enlargement again noted, unchanged. Musculoskeletal: Chest wall soft tissues are unremarkable. No acute bony abnormality or focal bone lesion. Degenerative changes throughout the thoracic spine. Review of the MIP images confirms the above findings. IMPRESSION: No evidence of pulmonary embolus. Small right pleural effusion, slightly increased since prior study. Partial visualization of the liver demonstrates enlarging hepatic lesions/metastases.  Stable small scattered pulmonary nodules as above. Severe diffuse coronary artery disease. Dilated, fluid-filled esophagus, possibly related to reflux or dysmotility. Electronically Signed   By: Rolm Baptise M.D.   On: 12/01/2015 13:40     PATHOLOGY:    ASSESSMENT AND PLAN:  Metastatic urothelial carcinoma (HCC) Recurrent metastatic, high grade urothelial cancer having completed 6 cycles of Carboplatin/Gemcitabine on 07/21/2015 excellent response to therapy but unfortunately, recurrence/progression of disease on imaging on 10/11/2015.  Now on immunotherapy consisting of Keytruda beginning on 10/20/2015.  Oncology history is up to date.  Labs today: CBC diff, CMET, TSH.  I personally reviewed and went over laboratory results with the patient.  The results are noted within this dictation.  Although his labs satisfy treatment parameters, based  upon his clinical scenario, treatment with be deferred x 1 week.  Continue anticoagulation with Xarelto for PE.  Continue Xgeva for bone metastases.  Next injection is due today, but this too will be deferred for the time being.   Oncology Flowsheet 10/27/2015  denosumab (XGEVA) [place] 120 mg   [place] and unfortunately reported to the ED, diagnosed with UTI, and subsequently admitted for 3 days.  He is now on PO antibiotics and his finishes this course on Saturday, 7/15.  He reports not feeling well.  He reports a sudden onset of SOB beginning this AM.  He reports needing multiple breaks during ambulation to the clinic.  This is very abnormal for the patient and the patient's family members confirm this complaint.  I have ordered an EKG and CT angio of chest STAT.  EKG is stable and NSR.  No acute changes compared to his past EKGs. I personally reviewed and went over EKG results with the patient.  The results are noted within this dictation.  CT angio of chest is negative for PE.  HOWEVER, there is enlargement in hepatic metastases.  I personally reviewed and went over laboratory results with the patient.  The results are noted within this dictation.  Given his clinical scenario, treatment will be deferred x 7 days at this time.  We will perform CT abd/pelvis imaging for restaging purposes.  Based upon these results, and how the patient is feeling, further medical oncology recommendations will follow.  Treatment plan is updated accordingly.  Return next week for follow-up after CT abd/pelvis.    ORDERS PLACED FOR THIS ENCOUNTER: Orders Placed This Encounter  Procedures  . CT Abdomen Pelvis W Contrast  . CT ANGIO CHEST PE W OR WO CONTRAST  . CBC with Differential  . TSH    MEDICATIONS PRESCRIBED THIS ENCOUNTER: No orders of the defined types were placed in this encounter.    THERAPY PLAN:  Defer treatment today.  Will complete restaging images this  week.  All questions were answered. The patient knows to call the clinic with any problems, questions or concerns. We can certainly see the patient much sooner if necessary.  Patient and plan discussed with Dr. Ancil Linsey and she is in agreement with the aforementioned.   This note is electronically signed by: Doy Mince 12/01/2015 9:41 PM

## 2015-11-30 NOTE — Assessment & Plan Note (Addendum)
Recurrent metastatic, high grade urothelial cancer having completed 6 cycles of Carboplatin/Gemcitabine on 07/21/2015 excellent response to therapy but unfortunately, recurrence/progression of disease on imaging on 10/11/2015.  Now on immunotherapy consisting of Keytruda beginning on 10/20/2015.  Oncology history is up to date.  Labs today: CBC diff, CMET, TSH.  I personally reviewed and went over laboratory results with the patient.  The results are noted within this dictation.  Although his labs satisfy treatment parameters, based upon his clinical scenario, treatment with be deferred x 1 week.  Continue anticoagulation with Xarelto for PE.  Continue Xgeva for bone metastases.  Next injection is due today, but this too will be deferred for the time being.   Oncology Flowsheet 10/27/2015  denosumab (XGEVA) [place] 120 mg   [place] and unfortunately reported to the ED, diagnosed with UTI, and subsequently admitted for 3 days.  He is now on PO antibiotics and his finishes this course on Saturday, 7/15.  He reports not feeling well.  He reports a sudden onset of SOB beginning this AM.  He reports needing multiple breaks during ambulation to the clinic.  This is very abnormal for the patient and the patient's family members confirm this complaint.  I have ordered an EKG and CT angio of chest STAT.  EKG is stable and NSR.  No acute changes compared to his past EKGs. I personally reviewed and went over EKG results with the patient.  The results are noted within this dictation.  CT angio of chest is negative for PE.  HOWEVER, there is enlargement in hepatic metastases.  I personally reviewed and went over laboratory results with the patient.  The results are noted within this dictation.  Given his clinical scenario, treatment will be deferred x 7 days at this time.  We will perform CT abd/pelvis imaging for restaging purposes.  Based upon these results, and how the patient is  feeling, further medical oncology recommendations will follow.  Treatment plan is updated accordingly.  Return next week for follow-up after CT abd/pelvis.

## 2015-12-01 ENCOUNTER — Other Ambulatory Visit: Payer: Self-pay

## 2015-12-01 ENCOUNTER — Ambulatory Visit (HOSPITAL_COMMUNITY): Payer: Medicare HMO

## 2015-12-01 ENCOUNTER — Encounter (HOSPITAL_COMMUNITY): Payer: Self-pay | Admitting: Oncology

## 2015-12-01 ENCOUNTER — Encounter (HOSPITAL_COMMUNITY): Payer: Medicare HMO | Attending: Hematology & Oncology | Admitting: Oncology

## 2015-12-01 ENCOUNTER — Ambulatory Visit (HOSPITAL_COMMUNITY)
Admission: RE | Admit: 2015-12-01 | Discharge: 2015-12-01 | Disposition: A | Discharge status: Home / Self Care | Payer: Medicare HMO | Source: Ambulatory Visit | Attending: Oncology | Admitting: Oncology

## 2015-12-01 ENCOUNTER — Inpatient Hospital Stay (HOSPITAL_COMMUNITY): Payer: Medicare HMO

## 2015-12-01 ENCOUNTER — Encounter (HOSPITAL_COMMUNITY): Payer: Medicare HMO

## 2015-12-01 ENCOUNTER — Other Ambulatory Visit (HOSPITAL_COMMUNITY): Payer: Medicare HMO

## 2015-12-01 VITALS — BP 120/61 | HR 70 | Temp 98.7°F | Resp 20 | Wt 273.8 lb

## 2015-12-01 DIAGNOSIS — I2510 Atherosclerotic heart disease of native coronary artery without angina pectoris: Secondary | ICD-10-CM | POA: Insufficient documentation

## 2015-12-01 DIAGNOSIS — C7910 Secondary malignant neoplasm of unspecified urinary organs: Secondary | ICD-10-CM

## 2015-12-01 DIAGNOSIS — J90 Pleural effusion, not elsewhere classified: Secondary | ICD-10-CM | POA: Insufficient documentation

## 2015-12-01 DIAGNOSIS — R0600 Dyspnea, unspecified: Secondary | ICD-10-CM

## 2015-12-01 DIAGNOSIS — C649 Malignant neoplasm of unspecified kidney, except renal pelvis: Secondary | ICD-10-CM | POA: Diagnosis not present

## 2015-12-01 DIAGNOSIS — C787 Secondary malignant neoplasm of liver and intrahepatic bile duct: Secondary | ICD-10-CM

## 2015-12-01 DIAGNOSIS — K769 Liver disease, unspecified: Secondary | ICD-10-CM | POA: Diagnosis not present

## 2015-12-01 DIAGNOSIS — C7971 Secondary malignant neoplasm of right adrenal gland: Secondary | ICD-10-CM | POA: Diagnosis not present

## 2015-12-01 DIAGNOSIS — C7972 Secondary malignant neoplasm of left adrenal gland: Secondary | ICD-10-CM | POA: Diagnosis not present

## 2015-12-01 LAB — COMPREHENSIVE METABOLIC PANEL WITH GFR
ALT: 62 U/L (ref 17–63)
AST: 68 U/L — ABNORMAL HIGH (ref 15–41)
Albumin: 2.3 g/dL — ABNORMAL LOW (ref 3.5–5.0)
Alkaline Phosphatase: 232 U/L — ABNORMAL HIGH (ref 38–126)
Anion gap: 7 (ref 5–15)
BUN: 25 mg/dL — ABNORMAL HIGH (ref 6–20)
CO2: 25 mmol/L (ref 22–32)
Calcium: 8.4 mg/dL — ABNORMAL LOW (ref 8.9–10.3)
Chloride: 102 mmol/L (ref 101–111)
Creatinine, Ser: 1.41 mg/dL — ABNORMAL HIGH (ref 0.61–1.24)
GFR calc Af Amer: 56 mL/min — ABNORMAL LOW
GFR calc non Af Amer: 48 mL/min — ABNORMAL LOW
Glucose, Bld: 112 mg/dL — ABNORMAL HIGH (ref 65–99)
Potassium: 4.3 mmol/L (ref 3.5–5.1)
Sodium: 134 mmol/L — ABNORMAL LOW (ref 135–145)
Total Bilirubin: 0.8 mg/dL (ref 0.3–1.2)
Total Protein: 6.5 g/dL (ref 6.5–8.1)

## 2015-12-01 LAB — TSH: TSH: 1.092 u[IU]/mL (ref 0.350–4.500)

## 2015-12-01 LAB — CBC WITH DIFFERENTIAL/PLATELET
Basophils Absolute: 0 10*3/uL (ref 0.0–0.1)
Basophils Relative: 0 %
Eosinophils Absolute: 0.2 10*3/uL (ref 0.0–0.7)
Eosinophils Relative: 3 %
HCT: 29.8 % — ABNORMAL LOW (ref 39.0–52.0)
Hemoglobin: 9.5 g/dL — ABNORMAL LOW (ref 13.0–17.0)
Lymphocytes Relative: 13 %
Lymphs Abs: 0.9 10*3/uL (ref 0.7–4.0)
MCH: 30.7 pg (ref 26.0–34.0)
MCHC: 31.9 g/dL (ref 30.0–36.0)
MCV: 96.4 fL (ref 78.0–100.0)
Monocytes Absolute: 0.9 10*3/uL (ref 0.1–1.0)
Monocytes Relative: 13 %
Neutro Abs: 5 10*3/uL (ref 1.7–7.7)
Neutrophils Relative %: 71 %
Platelets: 200 10*3/uL (ref 150–400)
RBC: 3.09 MIL/uL — ABNORMAL LOW (ref 4.22–5.81)
RDW: 14.2 % (ref 11.5–15.5)
WBC: 7 10*3/uL (ref 4.0–10.5)

## 2015-12-01 MED ORDER — IOPAMIDOL (ISOVUE-370) INJECTION 76%
100.0000 mL | Freq: Once | INTRAVENOUS | Status: AC | PRN
  Administered 2015-12-01: 100 mL via INTRAVENOUS

## 2015-12-01 NOTE — Patient Instructions (Addendum)
[place] at [facility] Discharge Instructions  RECOMMENDATIONS MADE BY THE CONSULTANT AND ANY TEST RESULTS WILL BE SENT TO YOUR REFERRING PHYSICIAN.  You were seen by Kirby Crigler, PA today. EKG and CT scan of your chest were completed today due to the shortness of breath. No chemo treatment today. You will have a CT scan of your abdomen and pelvis on July 19th here at [place] will need to go by the Radiology Dept to pick up the contrast to drink before your CT scan. Follow-up in the office on July 20th with Tom, and you will have lab work and chemo treatment. Call clinic with any questions or concerns.   Thank you for choosing [place] at [facility] to provide your oncology and hematology care.  To afford each patient quality time with our provider, please arrive at least 15 minutes before your scheduled appointment time.   Beginning January 23rd 2017 lab work for the [name] will be done in the  Main lab at [facility] on 1st floor. If you have a lab appointment with the [facility] please come in thru the  Main Entrance and check in at the main information desk  You need to re-schedule your appointment should you arrive 10 or more minutes late.  We strive to give you quality time with our providers, and arriving late affects you and other patients whose appointments are after yours.  Also, if you no show three or more times for appointments you may be dismissed from the clinic at the providers discretion.     Again, thank you for choosing [facility].  Our hope is that these requests will decrease the amount of time that you wait before being seen by our physicians.       _____________________________________________________________  Should you have questions after your visit to [facility], please contact our office at [phone] between the hours of 8:30 a.m. and 4:30 p.m.  Voicemails left  after 4:30 p.m. will not be returned until the following business day.  For prescription refill requests, have your pharmacy contact our office.         Resources For Cancer Patients and their Caregivers ? American Cancer Society: Can assist with transportation, wigs, general needs, runs Look Good Feel Better.        8721521239 ? Cancer Care: Provides financial assistance, online support groups, medication/co-pay assistance.  1-800-813-HOPE 386-402-7159) ? [facility] Assists [place] cancer patients and their families through emotional , educational and financial support.  9182354831 ? [place] DSS Where to apply for food stamps, Medicaid and utility assistance. 865-568-4498 ? [facility]: Transportation to medical appointments. (815) 650-8178 ? Social Security Administration: May apply for disability if have a Stage IV cancer. 517-677-2517 (416) 330-4860 ? [employer], Disability and Transit Services: Assists with nutrition, care and transit needs. [place] Support Programs: @10RELATIVEDAYS @ > Cancer Support Group  2nd Tuesday of the month 1pm-2pm, Journey Room  > Creative Journey  3rd Tuesday of the month 1130am-1pm, Journey Room  > Look Good Feel Better  1st Wednesday of the month 10am-12 noon, Journey Room (Call [facility] to register [phone])

## 2015-12-01 NOTE — Progress Notes (Signed)
Today's tx deferred x 1 week.

## 2015-12-02 ENCOUNTER — Ambulatory Visit (INDEPENDENT_AMBULATORY_CARE_PROVIDER_SITE_OTHER): Payer: Medicare HMO

## 2015-12-02 ENCOUNTER — Emergency Department (HOSPITAL_COMMUNITY)
Admission: EM | Admit: 2015-12-02 | Discharge: 2015-12-02 | Disposition: A | Discharge status: Home / Self Care | Payer: Medicare HMO | Attending: Emergency Medicine | Admitting: Emergency Medicine

## 2015-12-02 ENCOUNTER — Encounter (HOSPITAL_COMMUNITY)
Admission: RE | Admit: 2015-12-02 | Discharge: 2015-12-02 | Disposition: A | Discharge status: Home / Self Care | Payer: Medicare HMO | Source: Ambulatory Visit | Attending: Cardiology | Admitting: Cardiology

## 2015-12-02 ENCOUNTER — Encounter (HOSPITAL_COMMUNITY): Payer: Self-pay | Admitting: *Deleted

## 2015-12-02 DIAGNOSIS — E860 Dehydration: Secondary | ICD-10-CM | POA: Insufficient documentation

## 2015-12-02 DIAGNOSIS — R5383 Other fatigue: Secondary | ICD-10-CM | POA: Diagnosis not present

## 2015-12-02 DIAGNOSIS — I10 Essential (primary) hypertension: Secondary | ICD-10-CM | POA: Insufficient documentation

## 2015-12-02 DIAGNOSIS — I951 Orthostatic hypotension: Secondary | ICD-10-CM | POA: Diagnosis not present

## 2015-12-02 DIAGNOSIS — Z79899 Other long term (current) drug therapy: Secondary | ICD-10-CM | POA: Diagnosis not present

## 2015-12-02 DIAGNOSIS — R531 Weakness: Secondary | ICD-10-CM | POA: Diagnosis present

## 2015-12-02 DIAGNOSIS — Z87891 Personal history of nicotine dependence: Secondary | ICD-10-CM | POA: Insufficient documentation

## 2015-12-02 DIAGNOSIS — M1990 Unspecified osteoarthritis, unspecified site: Secondary | ICD-10-CM | POA: Insufficient documentation

## 2015-12-02 LAB — URINALYSIS, ROUTINE W REFLEX MICROSCOPIC
BILIRUBIN URINE: NEGATIVE
Glucose, UA: NEGATIVE mg/dL
Hgb urine dipstick: NEGATIVE
KETONES UR: NEGATIVE mg/dL
LEUKOCYTES UA: NEGATIVE
NITRITE: NEGATIVE
PH: 5.5 (ref 5.0–8.0)
PROTEIN: NEGATIVE mg/dL
Specific Gravity, Urine: >1.03 — ABNORMAL HIGH (ref 1.005–1.030)

## 2015-12-02 LAB — COMPREHENSIVE METABOLIC PANEL
ALK PHOS: 255 U/L — AB (ref 38–126)
ALT: 64 U/L — AB (ref 17–63)
ANION GAP: 5 (ref 5–15)
AST: 79 U/L — ABNORMAL HIGH (ref 15–41)
Albumin: 2.3 g/dL — ABNORMAL LOW (ref 3.5–5.0)
BUN: 24 mg/dL — ABNORMAL HIGH (ref 6–20)
CALCIUM: 8.5 mg/dL — AB (ref 8.9–10.3)
CO2: 26 mmol/L (ref 22–32)
CREATININE: 1.35 mg/dL — AB (ref 0.61–1.24)
Chloride: 103 mmol/L (ref 101–111)
GFR, EST AFRICAN AMERICAN: 59 mL/min — AB (ref >60)
GFR, EST NON AFRICAN AMERICAN: 51 mL/min — AB (ref >60)
Glucose, Bld: 111 mg/dL — ABNORMAL HIGH (ref 65–99)
Potassium: 4.1 mmol/L (ref 3.5–5.1)
SODIUM: 134 mmol/L — AB (ref 135–145)
TOTAL PROTEIN: 6.3 g/dL — AB (ref 6.5–8.1)
Total Bilirubin: 0.7 mg/dL (ref 0.3–1.2)

## 2015-12-02 LAB — CBC WITH DIFFERENTIAL/PLATELET
Basophils Absolute: 0 10*3/uL (ref 0.0–0.1)
Basophils Relative: 0 %
EOS ABS: 0.3 10*3/uL (ref 0.0–0.7)
EOS PCT: 4 %
HCT: 28.8 % — ABNORMAL LOW (ref 39.0–52.0)
HEMOGLOBIN: 9.3 g/dL — AB (ref 13.0–17.0)
LYMPHS ABS: 0.7 10*3/uL (ref 0.7–4.0)
LYMPHS PCT: 12 %
MCH: 31.1 pg (ref 26.0–34.0)
MCHC: 32.3 g/dL (ref 30.0–36.0)
MCV: 96.3 fL (ref 78.0–100.0)
MONOS PCT: 12 %
Monocytes Absolute: 0.8 10*3/uL (ref 0.1–1.0)
NEUTROS PCT: 72 %
Neutro Abs: 4.5 10*3/uL (ref 1.7–7.7)
Platelets: 183 10*3/uL (ref 150–400)
RBC: 2.99 MIL/uL — AB (ref 4.22–5.81)
RDW: 14.2 % (ref 11.5–15.5)
WBC: 6.2 10*3/uL (ref 4.0–10.5)

## 2015-12-02 MED ORDER — HEPARIN SOD (PORK) LOCK FLUSH 100 UNIT/ML IV SOLN
INTRAVENOUS | Status: AC
  Filled 2015-12-02: qty 5

## 2015-12-02 MED ORDER — SODIUM CHLORIDE 0.9 % IV BOLUS (SEPSIS)
500.0000 mL | Freq: Once | INTRAVENOUS | Status: AC
  Administered 2015-12-02: 500 mL via INTRAVENOUS

## 2015-12-02 MED ORDER — SODIUM CHLORIDE 0.9 % IV SOLN: INTRAVENOUS | Status: DC

## 2015-12-02 MED ORDER — HEPARIN SOD (PORK) LOCK FLUSH 100 UNIT/ML IV SOLN
500.0000 [IU] | Freq: Once | INTRAVENOUS | Status: AC
  Administered 2015-12-02: 500 [IU] via INTRAVENOUS

## 2015-12-02 NOTE — Discharge Instructions (Signed)
Lab work provided. Follow-up O hematology oncology clinic as scheduled for next week. Continue to hydrate yourself. Blood pressure improved here with IV fluids. Return for any new or worse symptoms.

## 2015-12-02 NOTE — Patient Instructions (Signed)
Go direct to ED to evaluation   Follow up with Arnold Long NP at 130 pm     Thank you for choosing [facility] !

## 2015-12-02 NOTE — ED Provider Notes (Signed)
CSN: [id number]     Arrival date & time 12/02/15  1552 History   First MD Initiated Contact with Patient 12/02/15 1554     Chief Complaint  Patient presents with  . Weakness     (Consider location/radiation/quality/duration/timing/severity/associated sxs/prior Treatment) Patient is a 75 y.o. male presenting with weakness. The history is provided by the patient.  Weakness Pertinent negatives include no chest pain, no abdominal pain and no shortness of breath.  Patient sent over from cardiac rehabilitation to Dr. branches office and if Dr. branches office here for low blood pressures. Patient feeling fatigued. Patient did not drink fluids before coming to the hospital. Patient denies any chest pain or abdominal pain. Patient is followed by hematology oncology as well for bladder cancer. Patient is on Lasix patient is on blood thinners. Patient denies any chest pain or shortness of breath. Did not pass out.  Past Medical History  Diagnosis Date  . GERD (gastroesophageal reflux disease)   . Hypokalemia   . Hypertension   . Cataract   . Arthritis   . Macular degeneration, age related   . DVT (deep venous thrombosis) ([place])     developed after traveling x 1, developed another after left foot surgery  . Hydrocele in adult   . 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 Bone metastases ([place]) 09/21/2015   Past Surgical History  Procedure Laterality Date  . Replacement total knee    . Appendectomy    . Hammer toe surgery Left   . Lapidus procedure    . Hallux fusion    . Colonoscopy  03/31/05  . Colonoscopy N/A 10/02/2014    [id number] diverticulosis  . Esophagogastroduodenoscopy N/A 10/02/2014    [id number] ulcerative reflux/s/p dilation/large HH  . Maloney dilation N/A 10/02/2014    Procedure: Venia Minks DILATION;  Surgeon: Daneil Dolin, MD;  [facility];  Service: Endoscopy;  Laterality: N/A;  . Esophagogastroduodenoscopy N/A 01/29/2015    Procedure:  ESOPHAGOGASTRODUODENOSCOPY (EGD);  Surgeon: Daneil Dolin, MD;  [facility];  Service: Endoscopy;  Laterality: N/A;  0800-rescheduled to 9/9 @ 915 Candy notified pt  . Esophageal dilation  01/29/2015    Procedure: ESOPHAGEAL DILATION;  Surgeon: Daneil Dolin, MD;  [facility];  Service: Endoscopy;;  . Portacath placement Left 04/13/15   Family History  Problem Relation Age of Onset  . Colon cancer Maternal Uncle   . Heart disease Father    Social History  Substance Use Topics  . Smoking status: Former Smoker -- 1.00 packs/day for 31 years    Types: Cigarettes    Quit date: 01/22/1984  . Smokeless tobacco: Never Used     Comment: qUIT IN 1985  . Alcohol Use: No    Review of Systems  Constitutional: Positive for fatigue. Negative for fever.  HENT: Negative for congestion.   Eyes: Negative for visual disturbance.  Respiratory: Negative for shortness of breath.   Cardiovascular: Negative for chest pain.  Gastrointestinal: Negative for nausea, vomiting and abdominal pain.  Genitourinary: Negative for dysuria.  Musculoskeletal: Negative for back pain.  Skin: Negative for rash.  Neurological: Positive for weakness. Negative for syncope.  Hematological: Does not bruise/bleed easily.  Psychiatric/Behavioral: Negative for confusion.      Allergies  Neulasta; Rabeprazole; and Vancomycin  Home Medications   Prior to Admission medications   Medication Sig Start Date End Date Taking? Authorizing Provider  acetaminophen (TYLENOL) 325 MG tablet Take 650 mg by mouth every 6 (six) hours  as needed for mild pain.   Yes Historical Provider, MD  atorvastatin (LIPITOR) 80 MG tablet Take 1 tablet (80 mg total) by mouth at bedtime. 11/11/15  Yes Arnoldo Lenis, MD  Calcium Carb-Cholecalciferol 500-600 MG-UNIT TABS Take 500-600 mg by mouth 2 (two) times daily.    Yes Historical Provider, MD  carvedilol (COREG) 12.5 MG tablet Take 12.5 mg by mouth 2 (two) times daily  with a meal.  08/26/15  Yes Historical Provider, MD  diphenhydrAMINE (SOMINEX) 25 MG tablet Take 25 mg by mouth at bedtime.    Yes Historical Provider, MD  furosemide (LASIX) 40 MG tablet Take 40 mg by mouth daily.  10/08/15  Yes Historical Provider, MD  lisinopril (PRINIVIL,ZESTRIL) 2.5 MG tablet Take 1 tablet (2.5 mg total) by mouth daily. 11/11/15  Yes Arnoldo Lenis, MD  Multiple Vitamins-Minerals (PRESERVISION AREDS 2 PO) Take 1 tablet by mouth 2 (two) times daily.   Yes Historical Provider, MD  omeprazole (PRILOSEC) 40 MG capsule Take 40 mg by mouth daily.  08/17/15  Yes Historical Provider, MD  Pembrolizumab (KEYTRUDA IV) Inject into the vein every 21 ( twenty-one) days.   Yes Historical Provider, MD  potassium chloride (KLOR-CON) 8 MEQ tablet Take 8 mEq by mouth daily.  10/08/15  Yes Historical Provider, MD  Wheat Dextrin (BENEFIBER DRINK MIX PO) Take by mouth.   Yes Historical Provider, MD  XARELTO 20 MG TABS tablet Take 20 mg by mouth daily with supper.  09/16/15  Yes Historical Provider, MD   BP 138/89 mmHg  Pulse 78  Temp(Src) 98.1 F (36.7 C) (Oral)  Resp 20  Ht 6\' 4"  (1.93 m)  Wt 130.182 kg  BMI 34.95 kg/m2  SpO2 100% Physical Exam  Constitutional: He is oriented to person, place, and time. He appears well-developed and well-nourished. No distress.  HENT:  Head: Normocephalic and atraumatic.  Mucous membranes slightly dry.  Eyes: Conjunctivae and EOM are normal. Pupils are equal, round, and reactive to light.  Sclerae slightly pale.  Neck: Normal range of motion. Neck supple.  Cardiovascular: Normal rate, regular rhythm and normal heart sounds.   No murmur heard. Pulmonary/Chest: Effort normal and breath sounds normal. No respiratory distress.  Abdominal: Soft. Bowel sounds are normal. There is no tenderness.  Musculoskeletal: Normal range of motion.  Neurological: He is alert and oriented to person, place, and time. No cranial nerve deficit. He exhibits normal muscle tone.  Coordination normal.  Skin: Skin is warm. No rash noted. He is not diaphoretic.  Nursing note and vitals reviewed.   ED Course  Procedures (including critical care time) Labs Review Labs Reviewed  CBC WITH DIFFERENTIAL/PLATELET - Abnormal; Notable for the following:    RBC 2.99 (*)    Hemoglobin 9.3 (*)    HCT 28.8 (*)    All other components within normal limits  COMPREHENSIVE METABOLIC PANEL - Abnormal; Notable for the following:    Sodium 134 (*)    Glucose, Bld 111 (*)    BUN 24 (*)    Creatinine, Ser 1.35 (*)    Calcium 8.5 (*)    Total Protein 6.3 (*)    Albumin 2.3 (*)    AST 79 (*)    ALT 64 (*)    Alkaline Phosphatase 255 (*)    GFR calc non Af Amer 51 (*)    GFR calc Af Amer 59 (*)    All other components within normal limits  URINALYSIS, ROUTINE W REFLEX MICROSCOPIC (NOT AT [facility]) -  Abnormal; Notable for the following:    Specific Gravity, Urine >1.030 (*)    All other components within normal limits    Imaging Review Ct Angio Chest Pe W Or Wo Contrast  12/01/2015  CLINICAL DATA:  Shortness of breath, sudden onset today. History of stage IV urothelial cancer. EXAM: CT ANGIOGRAPHY CHEST WITH CONTRAST TECHNIQUE: Multidetector CT imaging of the chest was performed using the standard protocol during bolus administration of intravenous contrast. Multiplanar CT image reconstructions and MIPs were obtained to evaluate the vascular anatomy. CONTRAST:  100 cc Isovue 370 IV COMPARISON:  10/11/2015 FINDINGS: Cardiovascular: No filling defects in the pulmonary arteries to suggest pulmonary emboli. Respiratory motion obscures the vessels in the lung apices. Dense diffuse coronary artery calcifications. Heart is borderline in size. Aorta is normal caliber with scattered aortic calcifications. Mediastinum/Nodes: Esophagus is fluid-filled and mildly dilated, possibly related to reflux or dysmotility. No mediastinal, hilar, or axillary adenopathy. Lungs/Pleura: Stable 5 mm nodule in the  right middle lobe. Small right pleural effusion, increased since prior study. Small nodule at the left lung base measures 6 mm and is stable on image 103. No new or enlarging pulmonary nodules. Biapical scarring noted. Upper Abdomen: Enlarging hepatic masses in the visualized liver. Hepatic lesion on image 143 measures 2.7 cm compared with 6 mm previously. Central right hepatic mass is partially imaged, measuring approximately 6.7 cm this measured up to 3.3 cm previously. These hepatic lesions are difficult to visualize given the arterial timing for the CTA chest. The liver is not imaged in its entirety. Left adrenal enlargement again noted, unchanged. Musculoskeletal: Chest wall soft tissues are unremarkable. No acute bony abnormality or focal bone lesion. Degenerative changes throughout the thoracic spine. Review of the MIP images confirms the above findings. IMPRESSION: No evidence of pulmonary embolus. Small right pleural effusion, slightly increased since prior study. Partial visualization of the liver demonstrates enlarging hepatic lesions/metastases. Stable small scattered pulmonary nodules as above. Severe diffuse coronary artery disease. Dilated, fluid-filled esophagus, possibly related to reflux or dysmotility. Electronically Signed   By: Rolm Baptise M.D.   On: 12/01/2015 13:40   I have personally reviewed and evaluated these images and lab results as part of my medical decision-making.   EKG Interpretation None      MDM   Final diagnoses:  Other fatigue  Dehydration    Patient referred in from a cardiology office for blood pressures lower than normal. No blood pressures were recorded below 90. Patient's had fatigue. Patient has a history of bladder cancer followed by hematology oncology. Patient's also been in cardiac rehabilitation for several months. Patient feeling hear much better after some IV fluids. Urine was very concentrated with a spec gravity of 1.030.  Patient is also on  Lasix and it sounds as if he did not drink much fluids today. Patient has follow-up with hematology oncology next week. Patient's labs provided to him. No others significant findings. Mild anemia but no significant change in hemoglobin compared to a few days ago. Patient will return for any new or worse symptoms.    Fredia Sorrow, MD 12/02/15 1753

## 2015-12-02 NOTE — ED Notes (Signed)
States he was sent from Dr Harl Bowie for evaluation of low blood pressure, states he has weakness also

## 2015-12-02 NOTE — Progress Notes (Addendum)
Harry Franklin came in for his  Cardiac Rehab orientation as scheduled at 2:30. Patient said that he was a little weak. Took his vitals.   Sitting BP 98/50, SaO2 97, HR 89   Standing: BP 80/50, SaO2 97, HR 101  Patient had temp of 99.7.   Called [facility] at patients request because wanted to possibly see Dr. Harl Bowie about his low BP. Was told to bring him over and a nurse would see him. Patient declined wheelchair. Escorted patient to [facility] office. Will call patient at a later date to reschedule orientation. Will wait until his appointment with Dr. Harl Bowie on July 24th to be evaluated before rescheduling orientation. Will send Dr. Harl Bowie a note as well.  Russella Dar, Exercise Physiologist/Manager Cardiac Rehab

## 2015-12-02 NOTE — ED Notes (Signed)
Patient given discharge instruction, verbalized understand. Port flushed with heprin and removed, band aid applied. Patient ambulatory out of the department with daughter

## 2015-12-02 NOTE — Progress Notes (Addendum)
Patient was in orientation for cardiac rehab today,was having BP taken and noted to have BP of 80/50 ,no HR noted on my end.Patient states his "get up " has "got up and gone".recent hospitalization for UTI and fevers.Cardiac rehab feels patient best to wait to continue until evaluated by Dr.Branch.Patient declined wheelchair escorted and walked into clinic by cardiac rehab manager, Dianne Coade  Orthostatic VS lying 122/60, HR 84                         Sitting 108/62, HR 86                         Standing 90/54, HR 99     Will page Dr Harl Bowie for instructions    CCarlton RN     Per Dr Modena Slater should go to ED for IV hydration and evaluation

## 2015-12-06 ENCOUNTER — Emergency Department (HOSPITAL_COMMUNITY)
Admission: EM | Admit: 2015-12-06 | Discharge: 2015-12-06 | Disposition: A | Discharge status: Home / Self Care | Payer: Medicare HMO | Source: Home / Self Care | Attending: Emergency Medicine | Admitting: Emergency Medicine

## 2015-12-06 ENCOUNTER — Encounter (HOSPITAL_COMMUNITY): Payer: Self-pay | Admitting: *Deleted

## 2015-12-06 DIAGNOSIS — R5383 Other fatigue: Secondary | ICD-10-CM | POA: Insufficient documentation

## 2015-12-06 DIAGNOSIS — Z79899 Other long term (current) drug therapy: Secondary | ICD-10-CM | POA: Insufficient documentation

## 2015-12-06 DIAGNOSIS — R0602 Shortness of breath: Secondary | ICD-10-CM

## 2015-12-06 DIAGNOSIS — Z87891 Personal history of nicotine dependence: Secondary | ICD-10-CM | POA: Insufficient documentation

## 2015-12-06 DIAGNOSIS — R109 Unspecified abdominal pain: Secondary | ICD-10-CM

## 2015-12-06 DIAGNOSIS — C679 Malignant neoplasm of bladder, unspecified: Secondary | ICD-10-CM | POA: Diagnosis not present

## 2015-12-06 DIAGNOSIS — I10 Essential (primary) hypertension: Secondary | ICD-10-CM | POA: Insufficient documentation

## 2015-12-06 DIAGNOSIS — R0609 Other forms of dyspnea: Secondary | ICD-10-CM | POA: Diagnosis not present

## 2015-12-06 LAB — COMPREHENSIVE METABOLIC PANEL
ALBUMIN: 2.2 g/dL — AB (ref 3.5–5.0)
ALT: 55 U/L (ref 17–63)
AST: 86 U/L — AB (ref 15–41)
Alkaline Phosphatase: 308 U/L — ABNORMAL HIGH (ref 38–126)
Anion gap: 9 (ref 5–15)
BILIRUBIN TOTAL: 1.1 mg/dL (ref 0.3–1.2)
BUN: 30 mg/dL — AB (ref 6–20)
CO2: 23 mmol/L (ref 22–32)
CREATININE: 1.82 mg/dL — AB (ref 0.61–1.24)
Calcium: 8.2 mg/dL — ABNORMAL LOW (ref 8.9–10.3)
Chloride: 100 mmol/L — ABNORMAL LOW (ref 101–111)
GFR calc Af Amer: 41 mL/min — ABNORMAL LOW (ref >60)
GFR, EST NON AFRICAN AMERICAN: 35 mL/min — AB (ref >60)
GLUCOSE: 144 mg/dL — AB (ref 65–99)
POTASSIUM: 4.7 mmol/L (ref 3.5–5.1)
Sodium: 132 mmol/L — ABNORMAL LOW (ref 135–145)
TOTAL PROTEIN: 6.3 g/dL — AB (ref 6.5–8.1)

## 2015-12-06 LAB — URINALYSIS, ROUTINE W REFLEX MICROSCOPIC
Bilirubin Urine: NEGATIVE
Glucose, UA: NEGATIVE mg/dL
HGB URINE DIPSTICK: NEGATIVE
LEUKOCYTES UA: NEGATIVE
NITRITE: NEGATIVE
PROTEIN: 30 mg/dL — AB
SPECIFIC GRAVITY, URINE: 1.02 (ref 1.005–1.030)
pH: 5 (ref 5.0–8.0)

## 2015-12-06 LAB — LACTIC ACID, PLASMA: Lactic Acid, Venous: 1.5 mmol/L (ref 0.5–1.9)

## 2015-12-06 LAB — CBC
HEMATOCRIT: 28.3 % — AB (ref 39.0–52.0)
HEMOGLOBIN: 9.2 g/dL — AB (ref 13.0–17.0)
MCH: 31.4 pg (ref 26.0–34.0)
MCHC: 32.5 g/dL (ref 30.0–36.0)
MCV: 96.6 fL (ref 78.0–100.0)
Platelets: 207 10*3/uL (ref 150–400)
RBC: 2.93 MIL/uL — AB (ref 4.22–5.81)
RDW: 14.6 % (ref 11.5–15.5)
WBC: 12.4 10*3/uL — AB (ref 4.0–10.5)

## 2015-12-06 LAB — URINE MICROSCOPIC-ADD ON: Squamous Epithelial / LPF: NONE SEEN

## 2015-12-06 MED ORDER — SODIUM CHLORIDE 0.9 % IV SOLN
1000.0000 mL | Freq: Once | INTRAVENOUS | Status: AC
  Administered 2015-12-06: 1000 mL via INTRAVENOUS

## 2015-12-06 MED ORDER — HYDROMORPHONE HCL 1 MG/ML IJ SOLN
0.5000 mg | Freq: Once | INTRAMUSCULAR | Status: AC
  Administered 2015-12-06: 0.5 mg via INTRAVENOUS
  Filled 2015-12-06: qty 1

## 2015-12-06 MED ORDER — CARVEDILOL 12.5 MG PO TABS: 6.2500 mg | ORAL_TABLET | Freq: Two times a day (BID) | ORAL | Status: DC

## 2015-12-06 MED ORDER — ONDANSETRON HCL 4 MG/2ML IJ SOLN
4.0000 mg | Freq: Once | INTRAMUSCULAR | Status: AC
  Administered 2015-12-06: 4 mg via INTRAVENOUS
  Filled 2015-12-06: qty 2

## 2015-12-06 MED ORDER — SODIUM CHLORIDE 0.9 % IV BOLUS (SEPSIS)
500.0000 mL | Freq: Once | INTRAVENOUS | Status: AC
  Administered 2015-12-06: 500 mL via INTRAVENOUS

## 2015-12-06 NOTE — ED Notes (Signed)
Called Dr. Penland(Oncology) for H. Fitzgerald,MD.

## 2015-12-06 NOTE — ED Provider Notes (Signed)
CSN: [id number]     Arrival date & time 12/06/15  [id number] History   First MD Initiated Contact with Patient 12/06/15 [phone]     Chief Complaint  Patient presents with  . Fatigue    (Consider location/radiation/quality/duration/timing/severity/associated sxs/prior Treatment) HPI  Harry Franklin is a 74-y/o male with metastatic bladder cancer who presents with complaint of fatigue. He says he has not felt well since last Tuesday (6 days ago). He has noticed it takes him double or triple the time to do his normal activities, like getting ready in the morning. He has to take multiple breaks and occasionally feels SOB with exertion. He takes lasix for CHF and reports he did not urinate after this morning's dose. He has chronic abdominal pain he has difficulty describing; tramadol sometimes helps this pain. He was seen in the ED on the 13th and found to be dehydrated and hyptotensive; he was given IV fluids at that time and discharged. He had no PE on CTA chest last week. He was also recently hospitalized for a UTI, but has had normal UAs since that time. He has been taking a reduced dose of lasix (20 mg vs 40 mg) for the last few days because of hypotension noted during last ED visit. He has had intermittent fevers that respond to tylenol over the last couple of weeks and is usually colder than those around him. Most recent CT abdomen pelvis in March showed metastasis to liver and left adrenal gland, as well as lytic lesion of L2, all with decrease in size.   Past Medical History  Diagnosis Date  . GERD (gastroesophageal reflux disease)   . Hypokalemia   . Hypertension   . Cataract   . Arthritis   . Macular degeneration, age related   . DVT (deep venous thrombosis) ([place])     developed after traveling x 1, developed another after left foot surgery  . Hydrocele in adult   . 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 Bone metastases ([place]) 09/21/2015   Past Surgical History  Procedure Laterality  Date  . Replacement total knee    . Appendectomy    . Hammer toe surgery Left   . Lapidus procedure    . Hallux fusion    . Colonoscopy  03/31/05  . Colonoscopy N/A 10/02/2014    [id number] diverticulosis  . Esophagogastroduodenoscopy N/A 10/02/2014    [id number] ulcerative reflux/s/p dilation/large HH  . Maloney dilation N/A 10/02/2014    Procedure: Venia Minks DILATION;  Surgeon: Daneil Dolin, MD;  [facility];  Service: Endoscopy;  Laterality: N/A;  . Esophagogastroduodenoscopy N/A 01/29/2015    Procedure: ESOPHAGOGASTRODUODENOSCOPY (EGD);  Surgeon: Daneil Dolin, MD;  [facility];  Service: Endoscopy;  Laterality: N/A;  0800-rescheduled to 9/9 @ 915 Candy notified pt  . Esophageal dilation  01/29/2015    Procedure: ESOPHAGEAL DILATION;  Surgeon: Daneil Dolin, MD;  [facility];  Service: Endoscopy;;  . Portacath placement Left 04/13/15   Family History  Problem Relation Age of Onset  . Colon cancer Maternal Uncle   . Heart disease Father    Social History  Substance Use Topics  . Smoking status: Former Smoker -- 1.00 packs/day for 31 years    Types: Cigarettes    Quit date: 01/22/1984  . Smokeless tobacco: Never Used     Comment: qUIT IN 1985  . Alcohol Use: No    Review of Systems  Constitutional: Positive for fever and chills.  HENT:  Negative for congestion and sinus pressure.   Eyes: Negative for visual disturbance.  Respiratory: Positive for shortness of breath. Negative for cough.   Cardiovascular: Negative for chest pain and palpitations.  Gastrointestinal: Positive for abdominal pain. Negative for nausea, vomiting, diarrhea, constipation and blood in stool.  Genitourinary: Negative for dysuria and urgency.  Musculoskeletal: Negative for gait problem and neck pain.  Neurological: Negative for dizziness and syncope.  Psychiatric/Behavioral: Negative for sleep disturbance. The patient is not nervous/anxious.    Allergies  Neulasta;  Rabeprazole; and Vancomycin  Home Medications   Prior to Admission medications   Medication Sig Start Date End Date Taking? Authorizing Provider  acetaminophen (TYLENOL) 325 MG tablet Take 650 mg by mouth every 6 (six) hours as needed for mild pain.   Yes Historical Provider, MD  atorvastatin (LIPITOR) 80 MG tablet Take 1 tablet (80 mg total) by mouth at bedtime. 11/11/15  Yes Arnoldo Lenis, MD  Calcium Carb-Cholecalciferol 500-600 MG-UNIT TABS Take 500-600 mg by mouth 2 (two) times daily. Reported on 12/06/2015   Yes Historical Provider, MD  diphenhydrAMINE (SOMINEX) 25 MG tablet Take 25 mg by mouth at bedtime.    Yes Historical Provider, MD  furosemide (LASIX) 20 MG tablet Take 20 mg by mouth daily.   Yes Historical Provider, MD  lisinopril (PRINIVIL,ZESTRIL) 2.5 MG tablet Take 1 tablet (2.5 mg total) by mouth daily. 11/11/15  Yes Arnoldo Lenis, MD  Multiple Vitamins-Minerals (PRESERVISION AREDS 2 PO) Take 1 tablet by mouth 2 (two) times daily.   Yes Historical Provider, MD  omeprazole (PRILOSEC) 40 MG capsule Take 40 mg by mouth daily.  08/17/15  Yes Historical Provider, MD  Pembrolizumab (KEYTRUDA IV) Inject into the vein every 21 ( twenty-one) days.   Yes Historical Provider, MD  potassium chloride (KLOR-CON) 8 MEQ tablet Take 8 mEq by mouth daily.  10/08/15  Yes Historical Provider, MD  Wheat Dextrin (BENEFIBER DRINK MIX PO) Take by mouth.   Yes Historical Provider, MD  XARELTO 20 MG TABS tablet Take 20 mg by mouth daily with supper.  09/16/15  Yes Historical Provider, MD  carvedilol (COREG) 12.5 MG tablet Take 0.5 tablets (6.25 mg total) by mouth 2 (two) times daily with a meal. 12/06/15   Hillary Corinda Gubler, MD   BP 104/73 mmHg  Pulse 70  Temp(Src) 98.7 F (37.1 C) (Oral)  Resp 17  Ht 6\' 4"  (1.93 m)  Wt 131.543 kg  BMI 35.31 kg/m2  SpO2 96% Physical Exam  Constitutional: He is oriented to person, place, and time. He appears well-developed and well-nourished. No distress.   HENT:  Head: Normocephalic and atraumatic.  Nose: Nose normal.  Mouth/Throat: Oropharynx is clear and moist.  Eyes: Conjunctivae and EOM are normal. Pupils are equal, round, and reactive to light.  Neck: Normal range of motion. Neck supple.  Cardiovascular: Normal rate, regular rhythm, normal heart sounds and intact distal pulses.   No murmur heard. Pulmonary/Chest: Effort normal and breath sounds normal. No respiratory distress. He has no wheezes. He has no rales.  Abdominal: Soft. Bowel sounds are normal. He exhibits distension. There is no tenderness. There is no rebound and no guarding.  Fluid wave present.   Musculoskeletal: Normal range of motion. He exhibits no edema.  Venous stasis changes at ankles.  Lymphadenopathy:    He has no cervical adenopathy.  Neurological: He is alert and oriented to person, place, and time. No cranial nerve deficit.  Skin: Skin is warm and dry. He is not  diaphoretic.  Palmar erythema.  Psychiatric: He has a normal mood and affect. His behavior is normal.  Nursing note and vitals reviewed.  ED Course  Procedures (including critical care time) Labs Review Labs Reviewed  URINALYSIS, ROUTINE W REFLEX MICROSCOPIC (NOT AT [facility]) - Abnormal; Notable for the following:    Color, Urine ORANGE (*)    Ketones, ur TRACE (*)    Protein, ur 30 (*)    All other components within normal limits  CBC - Abnormal; Notable for the following:    WBC 12.4 (*)    RBC 2.93 (*)    Hemoglobin 9.2 (*)    HCT 28.3 (*)    All other components within normal limits  COMPREHENSIVE METABOLIC PANEL - Abnormal; Notable for the following:    Sodium 132 (*)    Chloride 100 (*)    Glucose, Bld 144 (*)    BUN 30 (*)    Creatinine, Ser 1.82 (*)    Calcium 8.2 (*)    Total Protein 6.3 (*)    Albumin 2.2 (*)    AST 86 (*)    Alkaline Phosphatase 308 (*)    GFR calc non Af Amer 35 (*)    GFR calc Af Amer 41 (*)    All other components within normal limits  URINE  MICROSCOPIC-ADD ON - Abnormal; Notable for the following:    Bacteria, UA MANY (*)    Casts HYALINE CASTS (*)    All other components within normal limits  CULTURE, BLOOD (ROUTINE X 2)  CULTURE, BLOOD (ROUTINE X 2)  LACTIC ACID, PLASMA    Imaging Review No results found. I have personally reviewed and evaluated these images and lab results as part of my medical decision-making.   EKG Interpretation   Date/Time:  Monday December 06 2015 08:54:14 EDT Ventricular Rate:  79 PR Interval:    QRS Duration: 111 QT Interval:  385 QTC Calculation: 442 R Axis:   43 Text Interpretation:  Sinus rhythm Low voltage, precordial leads Abnormal  R-wave progression, early transition Borderline repolarization abnormality  Confirmed by ZAVITZ MD, JOSHUA [id number]) on 12/06/2015 9:15:28 AM      MDM   Final diagnoses:  Other fatigue   Pt presented with complaint of generalized fatigue. BP on continuous monitoring was mostly in the 90s/60s, though pulse in the 70s. UA consistent with dehydration with ketones but negative for infection. Improved slightly with 1.5 L fluid bolus. Blood cultures obtained due to immunocompromised state, transient fevers, and low blood pressures, but patient does not meet SIRS criteria and had no lactic acidosis. Anemia stable. Recommended reducing dose of coreg in half, as he does not have room to go down on his other blood pressure medications. He has follow-up with Cardiology tomorrow and will see his oncologist later this week and have repeat CT abdomen to look for progression of cancer. Could consider checking cortisol levels as another source of fatigue. Spoke with patient's oncologist Dr. Whitney Muse who suggested starting Paxil, but patient not interested at this time and denies anxiety.   Olene Floss, MD [facility], PGY-2     [facility], MD 12/07/15 [id number]  Elnora Morrison, MD 12/07/15 [phone]

## 2015-12-06 NOTE — ED Notes (Signed)
Pt verbalizes his temp was 100.9 at home. He took tylenol before coming here.   Pt is a bladder cancer pt, he had his last immuno therapy treatment 4 weeks ago.

## 2015-12-06 NOTE — ED Notes (Signed)
Pt states "he just doesn't feel good." He was seen at a hospital in [place] last week and admitted for a UTI. Pt was seen here later last week and told he had dehydration. Pt denies any specific pain.

## 2015-12-06 NOTE — Discharge Instructions (Signed)
Harry Franklin,  I would recommend cutting your coreg in half to 6.25 mg twice a day.  Continued work up of your fatigue may include checking your cortisol levels, as you can feel tired if your body is not making enough of this natural stress hormone.  You will get a call if blood cultures are positive.

## 2015-12-07 ENCOUNTER — Ambulatory Visit (INDEPENDENT_AMBULATORY_CARE_PROVIDER_SITE_OTHER): Payer: Medicare HMO | Admitting: Adult Health

## 2015-12-07 ENCOUNTER — Inpatient Hospital Stay (HOSPITAL_COMMUNITY)
Admission: AD | Admit: 2015-12-07 | Discharge: 2015-12-10 | DRG: 687 | Disposition: A | Discharge status: Hospice | Payer: Medicare HMO | Source: Ambulatory Visit | Attending: Internal Medicine | Admitting: Internal Medicine

## 2015-12-07 ENCOUNTER — Encounter: Payer: Self-pay | Admitting: Adult Health

## 2015-12-07 ENCOUNTER — Inpatient Hospital Stay (HOSPITAL_COMMUNITY): Payer: Medicare HMO

## 2015-12-07 VITALS — BP 106/60 | HR 86 | Ht 76.0 in | Wt 269.0 lb

## 2015-12-07 DIAGNOSIS — K219 Gastro-esophageal reflux disease without esophagitis: Secondary | ICD-10-CM | POA: Diagnosis present

## 2015-12-07 DIAGNOSIS — R5383 Other fatigue: Secondary | ICD-10-CM

## 2015-12-07 DIAGNOSIS — C7910 Secondary malignant neoplasm of unspecified urinary organs: Secondary | ICD-10-CM | POA: Diagnosis not present

## 2015-12-07 DIAGNOSIS — I480 Paroxysmal atrial fibrillation: Secondary | ICD-10-CM | POA: Diagnosis present

## 2015-12-07 DIAGNOSIS — Z66 Do not resuscitate: Secondary | ICD-10-CM | POA: Diagnosis present

## 2015-12-07 DIAGNOSIS — Z515 Encounter for palliative care: Secondary | ICD-10-CM | POA: Diagnosis present

## 2015-12-07 DIAGNOSIS — Z87891 Personal history of nicotine dependence: Secondary | ICD-10-CM

## 2015-12-07 DIAGNOSIS — Z8249 Family history of ischemic heart disease and other diseases of the circulatory system: Secondary | ICD-10-CM

## 2015-12-07 DIAGNOSIS — E785 Hyperlipidemia, unspecified: Secondary | ICD-10-CM | POA: Diagnosis present

## 2015-12-07 DIAGNOSIS — I2510 Atherosclerotic heart disease of native coronary artery without angina pectoris: Secondary | ICD-10-CM | POA: Diagnosis not present

## 2015-12-07 DIAGNOSIS — R1030 Lower abdominal pain, unspecified: Secondary | ICD-10-CM | POA: Diagnosis not present

## 2015-12-07 DIAGNOSIS — C787 Secondary malignant neoplasm of liver and intrahepatic bile duct: Secondary | ICD-10-CM | POA: Diagnosis present

## 2015-12-07 DIAGNOSIS — Z9221 Personal history of antineoplastic chemotherapy: Secondary | ICD-10-CM

## 2015-12-07 DIAGNOSIS — C7972 Secondary malignant neoplasm of left adrenal gland: Secondary | ICD-10-CM | POA: Diagnosis not present

## 2015-12-07 DIAGNOSIS — J449 Chronic obstructive pulmonary disease, unspecified: Secondary | ICD-10-CM | POA: Diagnosis present

## 2015-12-07 DIAGNOSIS — N183 Chronic kidney disease, stage 3 (moderate): Secondary | ICD-10-CM | POA: Diagnosis present

## 2015-12-07 DIAGNOSIS — H3530 Unspecified macular degeneration: Secondary | ICD-10-CM | POA: Diagnosis present

## 2015-12-07 DIAGNOSIS — R627 Adult failure to thrive: Secondary | ICD-10-CM | POA: Diagnosis present

## 2015-12-07 DIAGNOSIS — Z96651 Presence of right artificial knee joint: Secondary | ICD-10-CM | POA: Diagnosis present

## 2015-12-07 DIAGNOSIS — R531 Weakness: Secondary | ICD-10-CM

## 2015-12-07 DIAGNOSIS — C7951 Secondary malignant neoplasm of bone: Secondary | ICD-10-CM | POA: Diagnosis present

## 2015-12-07 DIAGNOSIS — I5022 Chronic systolic (congestive) heart failure: Secondary | ICD-10-CM | POA: Diagnosis present

## 2015-12-07 DIAGNOSIS — N189 Chronic kidney disease, unspecified: Secondary | ICD-10-CM

## 2015-12-07 DIAGNOSIS — D638 Anemia in other chronic diseases classified elsewhere: Secondary | ICD-10-CM | POA: Diagnosis present

## 2015-12-07 DIAGNOSIS — E869 Volume depletion, unspecified: Secondary | ICD-10-CM

## 2015-12-07 DIAGNOSIS — I130 Hypertensive heart and chronic kidney disease with heart failure and stage 1 through stage 4 chronic kidney disease, or unspecified chronic kidney disease: Secondary | ICD-10-CM | POA: Diagnosis present

## 2015-12-07 DIAGNOSIS — C679 Malignant neoplasm of bladder, unspecified: Secondary | ICD-10-CM

## 2015-12-07 DIAGNOSIS — Z86718 Personal history of other venous thrombosis and embolism: Secondary | ICD-10-CM | POA: Diagnosis not present

## 2015-12-07 DIAGNOSIS — Z800 Family history of malignant neoplasm of digestive organs: Secondary | ICD-10-CM

## 2015-12-07 DIAGNOSIS — N179 Acute kidney failure, unspecified: Secondary | ICD-10-CM | POA: Diagnosis present

## 2015-12-07 DIAGNOSIS — R180 Malignant ascites: Secondary | ICD-10-CM | POA: Diagnosis present

## 2015-12-07 DIAGNOSIS — R0609 Other forms of dyspnea: Secondary | ICD-10-CM | POA: Diagnosis present

## 2015-12-07 DIAGNOSIS — Z7901 Long term (current) use of anticoagulants: Secondary | ICD-10-CM | POA: Diagnosis not present

## 2015-12-07 DIAGNOSIS — C786 Secondary malignant neoplasm of retroperitoneum and peritoneum: Secondary | ICD-10-CM | POA: Diagnosis present

## 2015-12-07 DIAGNOSIS — R188 Other ascites: Secondary | ICD-10-CM

## 2015-12-07 DIAGNOSIS — M1990 Unspecified osteoarthritis, unspecified site: Secondary | ICD-10-CM | POA: Diagnosis present

## 2015-12-07 DIAGNOSIS — R0602 Shortness of breath: Secondary | ICD-10-CM | POA: Diagnosis not present

## 2015-12-07 DIAGNOSIS — I5020 Unspecified systolic (congestive) heart failure: Secondary | ICD-10-CM

## 2015-12-07 DIAGNOSIS — C689 Malignant neoplasm of urinary organ, unspecified: Secondary | ICD-10-CM

## 2015-12-07 MED ORDER — SODIUM CHLORIDE 0.9% FLUSH
3.0000 mL | Freq: Two times a day (BID) | INTRAVENOUS | Status: DC
  Administered 2015-12-07 – 2015-12-09 (×4): 3 mL via INTRAVENOUS

## 2015-12-07 MED ORDER — ACETAMINOPHEN 325 MG PO TABS
650.0000 mg | ORAL_TABLET | Freq: Four times a day (QID) | ORAL | Status: DC | PRN
Start: 2015-12-07 — End: 2015-12-10
  Administered 2015-12-07: 650 mg via ORAL
  Filled 2015-12-07 (×2): qty 2

## 2015-12-07 MED ORDER — ATORVASTATIN CALCIUM 40 MG PO TABS
80.0000 mg | ORAL_TABLET | Freq: Every day | ORAL | Status: DC
  Administered 2015-12-07 – 2015-12-09 (×3): 80 mg via ORAL
  Filled 2015-12-07 (×3): qty 2

## 2015-12-07 MED ORDER — SODIUM CHLORIDE 0.9 % IV SOLN
INTRAVENOUS | Status: DC
  Administered 2015-12-07: 18:00:00 via INTRAVENOUS

## 2015-12-07 MED ORDER — ENSURE ENLIVE PO LIQD
237.0000 mL | Freq: Two times a day (BID) | ORAL | Status: DC
  Administered 2015-12-08: 237 mL via ORAL

## 2015-12-07 MED ORDER — PANTOPRAZOLE SODIUM 40 MG PO TBEC
40.0000 mg | DELAYED_RELEASE_TABLET | Freq: Every day | ORAL | Status: DC
  Administered 2015-12-08 – 2015-12-10 (×3): 40 mg via ORAL
  Filled 2015-12-07 (×4): qty 1

## 2015-12-07 MED ORDER — RIVAROXABAN 20 MG PO TABS
20.0000 mg | ORAL_TABLET | Freq: Every day | ORAL | Status: DC
  Administered 2015-12-07: 20 mg via ORAL
  Filled 2015-12-07 (×3): qty 1

## 2015-12-07 MED ORDER — ONDANSETRON HCL 4 MG/2ML IJ SOLN
4.0000 mg | Freq: Four times a day (QID) | INTRAMUSCULAR | Status: DC | PRN
  Administered 2015-12-08: 4 mg via INTRAVENOUS
  Filled 2015-12-07: qty 2

## 2015-12-07 MED ORDER — DIPHENHYDRAMINE HCL 25 MG PO CAPS
25.0000 mg | ORAL_CAPSULE | Freq: Every day | ORAL | Status: DC
  Administered 2015-12-07 – 2015-12-09 (×3): 25 mg via ORAL
  Filled 2015-12-07 (×8): qty 1

## 2015-12-07 MED ORDER — ACETAMINOPHEN 650 MG RE SUPP: 650.0000 mg | Freq: Four times a day (QID) | RECTAL | Status: DC | PRN

## 2015-12-07 MED ORDER — POLYETHYLENE GLYCOL 3350 17 G PO PACK: 17.0000 g | PACK | Freq: Every day | ORAL | Status: DC | PRN

## 2015-12-07 MED ORDER — ONDANSETRON HCL 4 MG PO TABS: 4.0000 mg | ORAL_TABLET | Freq: Four times a day (QID) | ORAL | Status: DC | PRN

## 2015-12-07 NOTE — Progress Notes (Signed)
Cardiology Office Note   Date:  12/07/2015   ID:  Harry Franklin, DOB [birth year], MRN [id number]  PCP:  Harry Inches, MD  Cardiologist: Harry Spring, NP   No chief complaint on file.     History of Present Illness: Harry Franklin is a 75 y.o. male who presents for ongoing assessment and management of CAD with hx of NSTEMI in Jan of 2017, follow up [place] nuclear stres test on 07/06/15 that showed prior myocardial infarction with minimal peri-infarct ischemia in the apex and in the septal/inferoseptal/inferior wall territories, atrial fib on Xarelto, hx of DVT, and presumed PE, and bladder CA.  Due to metastatic bladder CA, no invasive cardiac testing was planned, and he is being treated medically.   Was seen in the ER on 12/06/2015 for profound fatigue. His coreg dose was reduced to 6.26 mg BID.He is here for close follow up. Is due to see oncologist as well.   He comes today much weaker, short of breath complaining of abdominal pain, back pain. He has been to the ER twice this week, and was sent home. Last visit was last evening. He is very tired and family is concerned about his deterioration. He is due to see oncology tomorrow but is extremely weak today.    Past Medical History  Diagnosis Date  . GERD (gastroesophageal reflux disease)   . Hypokalemia   . Hypertension   . Cataract   . Arthritis   . Macular degeneration, age related   . DVT (deep venous thrombosis) ([place])     developed after traveling x 1, developed another after left foot surgery  . Hydrocele in adult   . 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 Bone metastases ([place]) 09/21/2015    Past Surgical History  Procedure Laterality Date  . Replacement total knee    . Appendectomy    . Hammer toe surgery Left   . Lapidus procedure    . Hallux fusion    . Colonoscopy  03/31/05  . Colonoscopy N/A 10/02/2014    [id number] diverticulosis  . Esophagogastroduodenoscopy N/A 10/02/2014     [id number] ulcerative reflux/s/p dilation/large HH  . Maloney dilation N/A 10/02/2014    Procedure: Venia Minks DILATION;  Surgeon: Daneil Dolin, MD;  [facility];  Service: Endoscopy;  Laterality: N/A;  . Esophagogastroduodenoscopy N/A 01/29/2015    Procedure: ESOPHAGOGASTRODUODENOSCOPY (EGD);  Surgeon: Daneil Dolin, MD;  [facility];  Service: Endoscopy;  Laterality: N/A;  0800-rescheduled to 9/9 @ 915 Candy notified pt  . Esophageal dilation  01/29/2015    Procedure: ESOPHAGEAL DILATION;  Surgeon: Daneil Dolin, MD;  [facility];  Service: Endoscopy;;  . Portacath placement Left 04/13/15     Current Outpatient Prescriptions  Medication Sig Dispense Refill  . acetaminophen (TYLENOL) 325 MG tablet Take 650 mg by mouth every 6 (six) hours as needed for mild pain.    Marland Kitchen atorvastatin (LIPITOR) 80 MG tablet Take 1 tablet (80 mg total) by mouth at bedtime. 180 tablet 3  . Calcium Carb-Cholecalciferol 500-600 MG-UNIT TABS Take 500-600 mg by mouth 2 (two) times daily. Reported on 12/06/2015    . carvedilol (COREG) 12.5 MG tablet Take 0.5 tablets (6.25 mg total) by mouth 2 (two) times daily with a meal.    . diphenhydrAMINE (SOMINEX) 25 MG tablet Take 25 mg by mouth at bedtime.     . furosemide (LASIX) 20 MG tablet Take 20 mg by mouth daily.    Marland Kitchen  lisinopril (PRINIVIL,ZESTRIL) 2.5 MG tablet Take 1 tablet (2.5 mg total) by mouth daily. 180 tablet 3  . Multiple Vitamins-Minerals (PRESERVISION AREDS 2 PO) Take 1 tablet by mouth 2 (two) times daily.    Marland Kitchen omeprazole (PRILOSEC) 40 MG capsule Take 40 mg by mouth daily.     . Pembrolizumab (KEYTRUDA IV) Inject into the vein every 21 ( twenty-one) days.    . potassium chloride (KLOR-CON) 8 MEQ tablet Take 8 mEq by mouth daily.     . Wheat Dextrin (BENEFIBER DRINK MIX PO) Take by mouth.    Alveda Reasons 20 MG TABS tablet Take 20 mg by mouth daily with supper.      No current facility-administered medications for this visit.     Allergies:   Neulasta; Rabeprazole; and Vancomycin    Social History:  The patient  reports that he quit smoking about 31 years ago. His smoking use included Cigarettes. He has a 31 pack-year smoking history. He has never used smokeless tobacco. He reports that he does not drink alcohol or use illicit drugs.   Family History:  The patient's family history includes Colon cancer in his maternal uncle; Heart disease in his father.    ROS: All other systems are reviewed and negative. Unless otherwise mentioned in H&P    PHYSICAL EXAM: VS:  There were no vitals taken for this visit. , BMI There is no weight on file to calculate BMI. GEN: Well nourished, well developed, in no acute distress HEENT: normal Neck: no JVD, carotid bruits, or masses Cardiac: RRR; no murmurs, rubs, or gallops,no edema  Respiratory:  Coarse breath sounds, no wheezes, mild crackles.  MS: no deformity or atrophy Skin: warm and dry, no rash Neuro:  Strength and sensation are intact Psych: euthymic mood, full affect  Recent Labs: 08/02/2015: B Natriuretic Peptide 721.0* 08/03/2015: Magnesium 1.2* 12/01/2015: TSH 1.092 12/06/2015: ALT 55; BUN 30*; Creatinine, Ser 1.82*; Hemoglobin 9.2*; Platelets 207; Potassium 4.7; Sodium 132*    Lipid Panel    Component Value Date/Time   TRIG 49 08/02/2015 2212      Wt Readings from Last 3 Encounters:  12/06/15 290 lb (131.543 kg)  12/02/15 287 lb (130.182 kg)  12/01/15 273 lb 12.8 oz (124.195 kg)     ASSESSMENT AND PLAN:  1.CAD: No chest pain. Medically compliant. Will discontinue coreg due to hypotension.   2. Atrial fib: Rate is somewhat controlled. He continues on Xarelto. Last Hgb in ER was 9.3. I am concerned about continuing his anticoagulation with worsening anemia.   3. Bladder Cancer: METS to the liver. Abdominal pain with mild ascites noted. Pain radiating to the back.   I have discussed this with Dr. Harl Bowie and I feel that he needs to be admitted for  further treatment of non-cardiac reason. Will need to be seen by oncology. End of life issues should be discussed at this point.   Current medicines are reviewed at length with the patient today.    Labs/ tests ordered today include: None No orders of the defined types were placed in this encounter.     Disposition:   FU with 3 months.    Signed, Jory Sims, NP  12/07/2015 7:25 AM    [facility] [address], [place], [place] [address] Phone: [phone]; Fax: [phone]

## 2015-12-07 NOTE — Progress Notes (Signed)
Called by Leonia Reader for direction admission for Mr. Harry Franklin  75 y/o male with history of bladder cancer with mets to liver was seen twice this past week in ED for generalized weakness. He was following up in cardiology office for CAD and A fib. Noted to have low blood pressures. Had difficulty standing. Po intake has been poor. Appeared unwell. Labs from yesterday show creatinine is trending up. He complained of shortness of breath today and abdominal pain. Cardiology feels that patient would likely benefit from inpatient work up for generalized weakness. He is likely dehydrated. They have recommended oncology consultation to discuss goals of care. Medical bed requested.  Ernisha Sorn

## 2015-12-07 NOTE — H&P (Signed)
[facility] History and Physical  Harry Franklin NIO:[id number] DOB: 1940-08-16 DOA: 12/07/2015  Referring physician:  PCP: [name], MD   Chief Complaint: Fatigue, DOE, abd swelling  HPI: Harry Franklin is a 75 y.o. male with history of HTN, DVT and stage IV bladder cancer presenting to ED today with c/o progressive fatigue, DOE, listlessness over the last few weeks.  Also his abdomen has been swelling.  He has stage IV bladder Ca dx'd in Nov 2016 and things have "gone downhill since then".  Says he presented w blood in the urine, had a liver biopsy and took chemo but didn't tolerate it. Nonetheless he says the cancer responded to the chemo but now he thinks "its coming back".  Has weak heart 25% function and recently abdomen has been swelling.  Says the cancer has spread to the liver, adrenals, lymph nodes and bones that he is aware of.  Other issues are poor appetite and sig nausea which is new.  No cough, CP.  +Low grade fevers. +LE swelling which is not new. No dysuria, hematuria, no joint pain, HA or visual changes.  Normal BM, no diarrhea/ constipation. Hx COPD.    Last seen by [facility] on 11/30/15, sp 6 cycles of Carbo/ Gemcitabine w good response in March 2017 but then showed progression of disease Oct 11, 2015. Started on Keytruda 10/20/15.  Recent UTI aroudn 7/4 while out of town. Multiple episodes of SOB, marked DOE.  CT angio of chest done and was neg for PE, but did shown progression / enlargement of hepatic masses.  Chemo held for 7 days, planned CT abd the following week for restaging purposes.    Chart review: 2012 R total knee replacement, HTN, DJD, GERD 2017 Jan- DOE w nstemi/ afib w RVR, HTN, metastatic urothelial Ca w pancytopenia from chemo. 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 HTN. Got 4u prbcs March- acute resp/ cardiac arrest > found to have bilat LE DVT , felt to have PE. On carbo/ gemcitabine for urothelial Ca. Has systolic HF.  Rx'd heparin but Hb dropped, so  IVC filter placed w plans to resume coumadin when blood cts stabilize. Had PEA arrest.      Past Medical History  Past Medical History  Diagnosis Date  . GERD (gastroesophageal reflux disease)   . Hypokalemia   . Hypertension   . Cataract   . Arthritis   . Macular degeneration, age related   . DVT (deep venous thrombosis) ([place])     developed after traveling x 1, developed another after left foot surgery  . Hydrocele in adult   . 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 Bone metastases ([place]) 09/21/2015   Past Surgical History  Past Surgical History  Procedure Laterality Date  . Replacement total knee    . Appendectomy    . Hammer toe surgery Left   . Lapidus procedure    . Hallux fusion    . Colonoscopy  03/31/05  . Colonoscopy N/A 10/02/2014    GHW:[id number] diverticulosis  . Esophagogastroduodenoscopy N/A 10/02/2014    RCV:ELFYB ulcerative reflux/s/p dilation/large HH  . Maloney dilation N/A 10/02/2014    Procedure: Venia Minks DILATION;  Surgeon: Daneil Dolin, MD;  [facility];  Service: Endoscopy;  Laterality: N/A;  . Esophagogastroduodenoscopy N/A 01/29/2015    Procedure: ESOPHAGOGASTRODUODENOSCOPY (EGD);  Surgeon: Daneil Dolin, MD;  [facility];  Service: Endoscopy;  Laterality: N/A;  0800-rescheduled to 9/9 @ 915 Candy notified pt  .  Esophageal dilation  01/29/2015    Procedure: ESOPHAGEAL DILATION;  Surgeon: Daneil Dolin, MD;  [facility];  Service: Endoscopy;;  . Portacath placement Left 04/13/15   Family History  Family History  Problem Relation Age of Onset  . Colon cancer Maternal Uncle   . Heart disease Father    Social History  reports that he quit smoking about 31 years ago. His smoking use included Cigarettes. He has a 31 pack-year smoking history. He has never used smokeless tobacco. He reports that he does not drink alcohol or use illicit drugs. Allergies  Allergies  Allergen Reactions  . Neulasta  [Pegfilgrastim] Hives    Pt reports breaking out in hives less than 24 hours after taking neulasta. Hives appear on arms and legs.  . Rabeprazole     Other reaction(s): DIARRHEA  . Vancomycin Hives and Rash   Home medications Prior to Admission medications   Medication Sig Start Date End Date Taking? Authorizing Provider  acetaminophen (TYLENOL) 325 MG tablet Take 650 mg by mouth every 6 (six) hours as needed for mild pain.    Historical Provider, MD  atorvastatin (LIPITOR) 80 MG tablet Take 1 tablet (80 mg total) by mouth at bedtime. 11/11/15   Arnoldo Lenis, MD  Calcium Carb-Cholecalciferol 500-600 MG-UNIT TABS Take 500-600 mg by mouth 2 (two) times daily. Reported on 12/06/2015    Historical Provider, MD  carvedilol (COREG) 12.5 MG tablet Take 0.5 tablets (6.25 mg total) by mouth 2 (two) times daily with a meal. 12/06/15   Rogue Bussing, MD  diphenhydrAMINE (SOMINEX) 25 MG tablet Take 25 mg by mouth at bedtime.     Historical Provider, MD  furosemide (LASIX) 20 MG tablet Take 20 mg by mouth daily.    Historical Provider, MD  lisinopril (PRINIVIL,ZESTRIL) 2.5 MG tablet Take 1 tablet (2.5 mg total) by mouth daily. 11/11/15   Arnoldo Lenis, MD  Multiple Vitamins-Minerals (PRESERVISION AREDS 2 PO) Take 1 tablet by mouth 2 (two) times daily.    Historical Provider, MD  omeprazole (PRILOSEC) 40 MG capsule Take 40 mg by mouth daily.  08/17/15   Historical Provider, MD  Pembrolizumab (KEYTRUDA IV) Inject into the vein every 21 ( twenty-one) days.    Historical Provider, MD  potassium chloride (KLOR-CON) 8 MEQ tablet Take 8 mEq by mouth daily.  10/08/15   Historical Provider, MD  Wheat Dextrin (BENEFIBER DRINK MIX PO) Take by mouth.    Historical Provider, MD  XARELTO 20 MG TABS tablet Take 20 mg by mouth daily with supper.  09/16/15   Historical Provider, MD   Liver Function Tests  Recent Labs Lab 12/01/15 1003 12/02/15 1600 12/06/15 0947  AST 68* 79* 86*  ALT 62 64* 55  ALKPHOS  232* 255* 308*  BILITOT 0.8 0.7 1.1  PROT 6.5 6.3* 6.3*  ALBUMIN 2.3* 2.3* 2.2*   No results for input(s): LIPASE, AMYLASE in the last 168 hours. CBC  Recent Labs Lab 12/01/15 1003 12/02/15 1600 12/06/15 0947  WBC 7.0 6.2 12.4*  NEUTROABS 5.0 4.5  --   HGB 9.5* 9.3* 9.2*  HCT 29.8* 28.8* 28.3*  MCV 96.4 96.3 96.6  PLT 200 183 741   Basic Metabolic Panel  Recent Labs Lab 12/01/15 1003 12/02/15 1600 12/06/15 0947  NA 134* 134* 132*  K 4.3 4.1 4.7  CL 102 103 100*  CO2 _0 GLUCOSE 112* 111* 144*  BUN 25* 24* 30*  CREATININE 1.41* 1.35* 1.82*  CALCIUM 8.4* 8.5* 8.2*     Filed Vitals:   12/07/15 1512  BP: 107/51  Pulse: 84  Temp: 99.1 F (37.3 C)  TempSrc: Oral  Resp: 18  Height: _0  (1.93 m)  Weight: 122.7 kg (270 lb 8.1 oz)  SpO2: 100%   Exam: Gen pleasant elderly adult male, no distress No rash, cyanosis or gangrene Sclera anicteric, throat clear  No jvd or bruits, flat neck veins Chest clear bilat to bases RRR 2/6 soft SEM no RG Abd soft ntnd +bs, +ascites 2+, liver down 4 cm GU normal male MS no joint effusions or deformity Ext 1+ pitting bilat pretib edema / no wounds or ulcers Neuro is alert, Ox 3 , nf   Na 132  K 4.7  CO2 23  BUN 30  Cr 1.82 (baselien Cr 1.3- 1.5)  Ca 8.2  eGFR 35   Glu 144 alkphos 308  Tbili 1.1  Tprot 6.3  AST 86^  ALT 55 (nl)  Alb 2.2 WBC ^12k  Hb 9.2  plt 207 UA > many bact, orange, neg Hb, trace ket, neg LE/nit, 0-5 wbc/ rbc  Home meds: Lipitor, coreg 6.25 bid, lasix 20-40/ d, prinivil 2.5 mg/d, KCl Keytruda IV chemo, MVI, prilosec Xarelto 20 /d w supper  EKG (independ reviewed) > NSR, no acute changes BP 107/51  RA 100%   T 99.1   HR 84  R 18  Assessment: 1.  Fatigue/ severe DOE - unclear cause, doesn't look vol overloaded or infected. Not hypoxemic, lungs clear on exam.  Hb down a little, creat up, may be vol depleted.  Low grade temps but UA looks fine.  Get CXR.  Has ascites and LE edema (mild) which  may be due to liver mets and/or EF 25%.  Looks a little vol depleted overall. BP's are low. Will admit, hold lasix / ACEi/ coreg and give gentle IVF"s.  Check adrenal function (hx mets).  Get blood and urine cx's, no need for abx at this point.   2.  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  Stage IV bladder cancer - on chemo per Dr Whitney Muse.  Full code per pt.  4.  Hx HTN 5.  DVT on Xarelto- cont 6.  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

## 2015-12-07 NOTE — Progress Notes (Signed)
Name: Harry Franklin    DOB: 1941-02-28  Age: 75 y.o.  MR#: [id number]       PCP:  [name], MD      Insurance: Payor: [employer] MEDICARE / Plan: [place] [employer] / Product Type: *No Product type* /   CC:   No chief complaint on file.   VS Filed Vitals:   12/07/15 1330  Pulse: 86  Height: 6\' 4"  (1.93 m)  Weight: 269 lb (122.018 kg)  SpO2: 94%    Weights Current Weight  12/07/15 269 lb (122.018 kg)  12/06/15 290 lb (131.543 kg)  12/02/15 287 lb (130.182 kg)    Blood Pressure  BP Readings from Last 3 Encounters:  12/06/15 104/73  12/02/15 134/78  12/01/15 120/61     Admit date:  (Not on file) Last encounter with RMR:  06/25/2015   Allergy Neulasta; Rabeprazole; and Vancomycin  Current Outpatient Prescriptions  Medication Sig Dispense Refill  . acetaminophen (TYLENOL) 325 MG tablet Take 650 mg by mouth every 6 (six) hours as needed for mild pain.    Marland Kitchen atorvastatin (LIPITOR) 80 MG tablet Take 1 tablet (80 mg total) by mouth at bedtime. 180 tablet 3  . Calcium Carb-Cholecalciferol 500-600 MG-UNIT TABS Take 500-600 mg by mouth 2 (two) times daily. Reported on 12/06/2015    . carvedilol (COREG) 12.5 MG tablet Take 0.5 tablets (6.25 mg total) by mouth 2 (two) times daily with a meal.    . diphenhydrAMINE (SOMINEX) 25 MG tablet Take 25 mg by mouth at bedtime.     . furosemide (LASIX) 20 MG tablet Take 20 mg by mouth daily.    Marland Kitchen lisinopril (PRINIVIL,ZESTRIL) 2.5 MG tablet Take 1 tablet (2.5 mg total) by mouth daily. 180 tablet 3  . Multiple Vitamins-Minerals (PRESERVISION AREDS 2 PO) Take 1 tablet by mouth 2 (two) times daily.    Marland Kitchen omeprazole (PRILOSEC) 40 MG capsule Take 40 mg by mouth daily.     . Pembrolizumab (KEYTRUDA IV) Inject into the vein every 21 ( twenty-one) days.    . potassium chloride (KLOR-CON) 8 MEQ tablet Take 8 mEq by mouth daily.     . Wheat Dextrin (BENEFIBER DRINK MIX PO) Take by mouth.    Alveda Reasons 20 MG TABS tablet Take 20 mg by mouth daily with  supper.      No current facility-administered medications for this visit.    Discontinued Meds:   There are no discontinued medications.  Patient Active Problem List   Diagnosis Date Noted  . Bone metastases ([place]) 09/21/2015  . Acute pulmonary embolism ([place]) 09/15/2015  . Acute pulmonary edema (HCC)   . Cardiac arrest ([place])   . Respiratory arrest ([place])   . AKI (acute kidney injury) ([place])   . Thrombocytopenia ([place])   . Absolute anemia   . Acute hypoxemic respiratory failure ([place])   . Malignant neoplasm of urinary bladder ([place])   . Recurrent falls 07/21/2015  . NSTEMI (non-ST elevated myocardial infarction) ([place]) 06/09/2015  . Dyspnea 06/08/2015  . Elevated troponin 06/08/2015  . Atrial fibrillation with rapid ventricular response ([place]) 06/08/2015  . Antineoplastic chemotherapy induced pancytopenia ([place]) 06/08/2015  . 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 Abdominal pain 04/09/2015  . Peptic stricture of esophagus   . Hiatal hernia   . Dysphagia, pharyngoesophageal phase   . Reflux esophagitis   . Dysphagia 07/30/2014  . Encounter for screening colonoscopy 07/30/2014    LABS    Component Value Date/Time   NA 132*  12/06/2015 0947   NA 134* 12/02/2015 1600   NA 134* 12/01/2015 1003   K 4.7 12/06/2015 0947   K 4.1 12/02/2015 1600   K 4.3 12/01/2015 1003   CL 100* 12/06/2015 0947   CL 103 12/02/2015 1600   CL 102 12/01/2015 1003   CO2 23 12/06/2015 0947   CO2 26 12/02/2015 1600   CO2 25 12/01/2015 1003   GLUCOSE 144* 12/06/2015 0947   GLUCOSE 111* 12/02/2015 1600   GLUCOSE 112* 12/01/2015 1003   BUN 30* 12/06/2015 0947   BUN 24* 12/02/2015 1600   BUN 25* 12/01/2015 1003   CREATININE 1.82* 12/06/2015 0947   CREATININE 1.35* 12/02/2015 1600   CREATININE 1.41* 12/01/2015 1003   CALCIUM 8.2* 12/06/2015 0947   CALCIUM 8.5* 12/02/2015 1600   CALCIUM 8.4* 12/01/2015 1003   GFRNONAA 35* 12/06/2015 0947   GFRNONAA 51* 12/02/2015 1600   GFRNONAA 48*  12/01/2015 1003   GFRAA 41* 12/06/2015 0947   GFRAA 59* 12/02/2015 1600   GFRAA 56* 12/01/2015 1003   CMP     Component Value Date/Time   NA 132* 12/06/2015 0947   K 4.7 12/06/2015 0947   CL 100* 12/06/2015 0947   CO2 23 12/06/2015 0947   GLUCOSE 144* 12/06/2015 0947   BUN 30* 12/06/2015 0947   CREATININE 1.82* 12/06/2015 0947   CALCIUM 8.2* 12/06/2015 0947   PROT 6.3* 12/06/2015 0947   ALBUMIN 2.2* 12/06/2015 0947   AST 86* 12/06/2015 0947   ALT 55 12/06/2015 0947   ALKPHOS 308* 12/06/2015 0947   BILITOT 1.1 12/06/2015 0947   GFRNONAA 35* 12/06/2015 0947   GFRAA 41* 12/06/2015 0947       Component Value Date/Time   WBC 12.4* 12/06/2015 0947   WBC 6.2 12/02/2015 1600   WBC 7.0 12/01/2015 1003   HGB 9.2* 12/06/2015 0947   HGB 9.3* 12/02/2015 1600   HGB 9.5* 12/01/2015 1003   HCT 28.3* 12/06/2015 0947   HCT 28.8* 12/02/2015 1600   HCT 29.8* 12/01/2015 1003   MCV 96.6 12/06/2015 0947   MCV 96.3 12/02/2015 1600   MCV 96.4 12/01/2015 1003    Lipid Panel     Component Value Date/Time   TRIG 49 08/02/2015 2212    ABG    Component Value Date/Time   PHART 7.455* 08/03/2015 0339   PCO2ART 37.8 08/03/2015 0339   PO2ART 129* 08/03/2015 0339   HCO3 26.2* 08/03/2015 0339   TCO2 27.3 08/03/2015 0339   ACIDBASEDEF 3.3* 08/02/2015 1119   O2SAT 99.1 08/03/2015 0339     Lab Results  Component Value Date   TSH 1.092 12/01/2015   BNP (last 3 results)  Recent Labs  06/08/15 0830 08/02/15 1039  BNP 657.0* 721.0*    ProBNP (last 3 results) No results for input(s): PROBNP in the last 8760 hours.  Cardiac Panel (last 3 results) No results for input(s): CKTOTAL, CKMB, TROPONINI, RELINDX in the last 72 hours.  Iron/TIBC/Ferritin/ %Sat    Component Value Date/Time   IRON 76 07/21/2015 1143   TIBC 326 07/21/2015 1143   FERRITIN 377* 07/21/2015 1143   IRONPCTSAT 23 07/21/2015 1143   IRONPCTSAT TEST NOT PERFORMED 11/23/2012 0928     EKG Orders placed or  performed during the hospital encounter of 12/06/15  . EKG 12-Lead  . EKG 12-Lead     Prior Assessment and Plan Problem List as of 12/07/2015      Cardiovascular and Mediastinum   Atrial fibrillation with rapid ventricular response ([place])  NSTEMI (non-ST elevated myocardial infarction) [facility])   Cardiac arrest ([place])   Acute pulmonary embolism ([place])     Respiratory   Hiatal hernia   Acute pulmonary edema ([place])   Respiratory arrest ([place])   Acute hypoxemic respiratory failure [facility])     Digestive   Dysphagia   Last Assessment & Plan 07/30/2014 Office Visit Written 07/30/2014  2:41 PM by Carlis Stable, NP    Patient with occasional worsenin solid food dysphagia, denies pill dysphagia. Had esophageal dilation sometime after 2006 but doesn't remember exact year or where. History of GERD well controlled on current regimen. No red flag/warning signs/symptoms. Wishes to proceed with repeat EGD and possible dilation while undergoing colonoscopy. No blood thinners other than saily ASA.   Proceed with TCS and EGD +/- dilation with Dr. Gala Romney in near future: the risks, benefits, and alternatives have been discussed with the patient in detail. The patient states understanding and desires to proceed.      Dysphagia, pharyngoesophageal phase   Reflux esophagitis   Last Assessment & Plan 01/26/2015 Office Visit Written 01/29/2015  8:57 AM by Carlis Stable, NP    Asian status post recent endoscopy on 10/02/2014 with [facility] dilation due to GERD and recurrent esophageal dysphagia. 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 Recommended stop omeprazole and start Dexilant. However the patient is intolerant to many PPIs. He stopped taking the Dexilant because he was having adverse effects and is currently on Zantac 150 milligrams twice a day. He did tolerate Prilosec however and felt his symptoms were better controlled on diet and then the Zantac.  Today we will have him continue his  Zantac and restart his omeprazole 20 mg twice a day for better symptomatic control due to his intolerance of Dexilant. We'll have him repeat endoscopy for surveillance as previously recommended and return for follow-up in 3 months.  Proceed with EGD with Dr. Gala Romney in near future: the risks, benefits, and alternatives have been discussed with the patient in detail. The patient states understanding and desires to proceed.  The patient is not currently on any anticoagulants, chronic pain medications, anxiolytics, or antidepressants. Previous procedure completed under conscious sedation without noted conversation. Conscious sedation likely adequate for this procedure as well.      Peptic stricture of esophagus     Musculoskeletal and Integument   Bone metastases ([place])     Genitourinary   Metastatic urothelial carcinoma [facility])   Last Assessment & Plan 12/01/2015 Office Visit Edited 12/01/2015  9:41 PM by Baird Cancer, PA-C    Recurrent metastatic, high grade urothelial cancer having completed 6 cycles of Carboplatin/Gemcitabine on 07/21/2015 excellent response to therapy but unfortunately, recurrence/progression of disease on imaging on 10/11/2015.  Now on immunotherapy consisting of Keytruda beginning on 10/20/2015.  Oncology history is up to date.  Labs today: CBC diff, CMET, TSH.  I personally reviewed and went over laboratory results with the patient.  The results are noted within this dictation.  Although his labs satisfy treatment parameters, based upon his clinical scenario, treatment with be deferred x 1 week.  Continue anticoagulation with Xarelto for PE.  Continue Xgeva for bone metastases.  Next injection is due today, but this too will be deferred for the time being.   Oncology Flowsheet 10/27/2015  denosumab (XGEVA) [place] 120 mg   [place] and unfortunately reported to the ED, diagnosed with UTI, and subsequently admitted for 3 days.  He is  now on PO  antibiotics and his finishes this course on Saturday, 7/15.  He reports not feeling well.  He reports a sudden onset of SOB beginning this AM.  He reports needing multiple breaks during ambulation to the clinic.  This is very abnormal for the patient and the patient's family members confirm this complaint.  I have ordered an EKG and CT angio of chest STAT.  EKG is stable and NSR.  No acute changes compared to his past EKGs. I personally reviewed and went over EKG results with the patient.  The results are noted within this dictation.  CT angio of chest is negative for PE.  HOWEVER, there is enlargement in hepatic metastases.  I personally reviewed and went over laboratory results with the patient.  The results are noted within this dictation.  Given his clinical scenario, treatment will be deferred x 7 days at this time.  We will perform CT abd/pelvis imaging for restaging purposes.  Based upon these results, and how the patient is feeling, further medical oncology recommendations will follow.  Treatment plan is updated accordingly.  Return next week for follow-up after CT abd/pelvis.      AKI (acute kidney injury) ([place])   Malignant neoplasm of urinary bladder ([place])     Hematopoietic and Hemostatic   Antineoplastic chemotherapy induced pancytopenia [facility])     Other   Encounter for screening colonoscopy   Last Assessment & Plan 09/28/2014 Office Visit Written 09/28/2014  9:09 AM by Carlis Stable, NP    75 year old male presents for update H&P prior to procedure. No changes from last visit. Essentially asymptomatic from a GI standpoint. Still has his medication and instructions from his previous visit. Had to delay colonoscopy due to scheduling issues. We'll proceed with previously scheduled colonoscopy, which is said to occur this Friday.      Abdominal pain   Dyspnea   Elevated troponin   Recurrent falls   Thrombocytopenia (HCC)   Absolute anemia       Imaging: Ct Angio Chest Pe W Or Wo  Contrast  12/01/2015  CLINICAL DATA:  Shortness of breath, sudden onset today. History of stage IV urothelial cancer. EXAM: CT ANGIOGRAPHY CHEST WITH CONTRAST TECHNIQUE: Multidetector CT imaging of the chest was performed using the standard protocol during bolus administration of intravenous contrast. Multiplanar CT image reconstructions and MIPs were obtained to evaluate the vascular anatomy. CONTRAST:  100 cc Isovue 370 IV COMPARISON:  10/11/2015 FINDINGS: Cardiovascular: No filling defects in the pulmonary arteries to suggest pulmonary emboli. Respiratory motion obscures the vessels in the lung apices. Dense diffuse coronary artery calcifications. Heart is borderline in size. Aorta is normal caliber with scattered aortic calcifications. Mediastinum/Nodes: Esophagus is fluid-filled and mildly dilated, possibly related to reflux or dysmotility. No mediastinal, hilar, or axillary adenopathy. Lungs/Pleura: Stable 5 mm nodule in the right middle lobe. Small right pleural effusion, increased since prior study. Small nodule at the left lung base measures 6 mm and is stable on image 103. No new or enlarging pulmonary nodules. Biapical scarring noted. Upper Abdomen: Enlarging hepatic masses in the visualized liver. Hepatic lesion on image 143 measures 2.7 cm compared with 6 mm previously. Central right hepatic mass is partially imaged, measuring approximately 6.7 cm this measured up to 3.3 cm previously. These hepatic lesions are difficult to visualize given the arterial timing for the CTA chest. The liver is not imaged in its entirety. Left adrenal enlargement again noted, unchanged. Musculoskeletal: Chest wall soft tissues are unremarkable. No  acute bony abnormality or focal bone lesion. Degenerative changes throughout the thoracic spine. Review of the MIP images confirms the above findings. IMPRESSION: No evidence of pulmonary embolus. Small right pleural effusion, slightly increased since prior study. Partial  visualization of the liver demonstrates enlarging hepatic lesions/metastases. Stable small scattered pulmonary nodules as above. Severe diffuse coronary artery disease. Dilated, fluid-filled esophagus, possibly related to reflux or dysmotility. Electronically Signed   By: Rolm Baptise M.D.   On: 12/01/2015 13:40

## 2015-12-08 ENCOUNTER — Ambulatory Visit (HOSPITAL_COMMUNITY): Admission: RE | Admit: 2015-12-08 | Payer: Medicare HMO | Source: Ambulatory Visit

## 2015-12-08 ENCOUNTER — Inpatient Hospital Stay (HOSPITAL_COMMUNITY): Payer: Medicare HMO

## 2015-12-08 ENCOUNTER — Other Ambulatory Visit (HOSPITAL_COMMUNITY): Payer: Self-pay | Admitting: Oncology

## 2015-12-08 ENCOUNTER — Other Ambulatory Visit (HOSPITAL_COMMUNITY): Payer: Medicare HMO

## 2015-12-08 ENCOUNTER — Ambulatory Visit (HOSPITAL_COMMUNITY): Payer: Medicare HMO | Admitting: Oncology

## 2015-12-08 ENCOUNTER — Ambulatory Visit (HOSPITAL_COMMUNITY): Payer: Medicare HMO

## 2015-12-08 DIAGNOSIS — R0602 Shortness of breath: Secondary | ICD-10-CM

## 2015-12-08 DIAGNOSIS — N179 Acute kidney failure, unspecified: Secondary | ICD-10-CM

## 2015-12-08 DIAGNOSIS — C787 Secondary malignant neoplasm of liver and intrahepatic bile duct: Secondary | ICD-10-CM

## 2015-12-08 DIAGNOSIS — N189 Chronic kidney disease, unspecified: Secondary | ICD-10-CM

## 2015-12-08 DIAGNOSIS — Z86718 Personal history of other venous thrombosis and embolism: Secondary | ICD-10-CM

## 2015-12-08 DIAGNOSIS — Z515 Encounter for palliative care: Secondary | ICD-10-CM | POA: Insufficient documentation

## 2015-12-08 DIAGNOSIS — C7910 Secondary malignant neoplasm of unspecified urinary organs: Secondary | ICD-10-CM

## 2015-12-08 DIAGNOSIS — Z87891 Personal history of nicotine dependence: Secondary | ICD-10-CM

## 2015-12-08 DIAGNOSIS — C7972 Secondary malignant neoplasm of left adrenal gland: Secondary | ICD-10-CM

## 2015-12-08 DIAGNOSIS — R0609 Other forms of dyspnea: Secondary | ICD-10-CM

## 2015-12-08 DIAGNOSIS — R627 Adult failure to thrive: Secondary | ICD-10-CM

## 2015-12-08 DIAGNOSIS — I5022 Chronic systolic (congestive) heart failure: Secondary | ICD-10-CM

## 2015-12-08 DIAGNOSIS — C679 Malignant neoplasm of bladder, unspecified: Secondary | ICD-10-CM | POA: Insufficient documentation

## 2015-12-08 DIAGNOSIS — R188 Other ascites: Secondary | ICD-10-CM

## 2015-12-08 LAB — COMPREHENSIVE METABOLIC PANEL
ALK PHOS: 276 U/L — AB (ref 38–126)
ALT: 44 U/L (ref 17–63)
ANION GAP: 8 (ref 5–15)
AST: 79 U/L — ABNORMAL HIGH (ref 15–41)
Albumin: 2 g/dL — ABNORMAL LOW (ref 3.5–5.0)
BILIRUBIN TOTAL: 1.1 mg/dL (ref 0.3–1.2)
BUN: 29 mg/dL — ABNORMAL HIGH (ref 6–20)
CALCIUM: 7.6 mg/dL — AB (ref 8.9–10.3)
CO2: 24 mmol/L (ref 22–32)
Chloride: 102 mmol/L (ref 101–111)
Creatinine, Ser: 1.58 mg/dL — ABNORMAL HIGH (ref 0.61–1.24)
GFR calc non Af Amer: 42 mL/min — ABNORMAL LOW (ref >60)
GFR, EST AFRICAN AMERICAN: 49 mL/min — AB (ref >60)
Glucose, Bld: 96 mg/dL (ref 65–99)
Potassium: 4 mmol/L (ref 3.5–5.1)
SODIUM: 134 mmol/L — AB (ref 135–145)
TOTAL PROTEIN: 5.6 g/dL — AB (ref 6.5–8.1)

## 2015-12-08 LAB — ECHOCARDIOGRAM COMPLETE
CHL CUP STROKE VOLUME: 62 mL
FS: 14 % — AB (ref 28–44)
Height: 76 in
IVS/LV PW RATIO, ED: 1.25
LA ID, A-P, ES: 38 mm
LA diam end sys: 38 mm
LA vol A4C: 67.7 ml
LA vol index: 27.1 mL/m2
LADIAMINDEX: 1.46 cm/m2
LAVOL: 70.4 mL
LV SIMPSON'S DISK: 47
LV e' LATERAL: 7.94 cm/s
LVDIAVOL: 132 mL (ref 62–150)
LVDIAVOLIN: 51 mL/m2
LVOT VTI: 24.3 cm
LVOT area: 4.52 cm2
LVOT peak grad rest: 4 mmHg
LVOT peak vel: 104 cm/s
LVOTD: 24 mm
LVOTSV: 110 mL
LVSYSVOL: 70 mL — AB (ref 21–61)
LVSYSVOLIN: 27 mL/m2
PW: 9.68 mm — AB (ref 0.6–1.1)
RV LATERAL S' VELOCITY: 14 cm/s
RV TAPSE: 23.9 mm
TDI e' lateral: 7.94
TDI e' medial: 5.55
Weight: 4342.18 oz

## 2015-12-08 LAB — CORTISOL-AM, BLOOD: CORTISOL - AM: 22 ug/dL (ref 6.7–22.6)

## 2015-12-08 LAB — URINE CULTURE

## 2015-12-08 LAB — CBC
HCT: 26.4 % — ABNORMAL LOW (ref 39.0–52.0)
HEMOGLOBIN: 8.5 g/dL — AB (ref 13.0–17.0)
MCH: 31.1 pg (ref 26.0–34.0)
MCHC: 32.2 g/dL (ref 30.0–36.0)
MCV: 96.7 fL (ref 78.0–100.0)
Platelets: 202 10*3/uL (ref 150–400)
RBC: 2.73 MIL/uL — AB (ref 4.22–5.81)
RDW: 14.9 % (ref 11.5–15.5)
WBC: 10.2 10*3/uL (ref 4.0–10.5)

## 2015-12-08 LAB — TSH: TSH: 1.485 u[IU]/mL (ref 0.350–4.500)

## 2015-12-08 MED ORDER — TRAMADOL HCL 50 MG PO TABS
50.0000 mg | ORAL_TABLET | Freq: Four times a day (QID) | ORAL | Status: DC | PRN
  Administered 2015-12-08 – 2015-12-10 (×4): 50 mg via ORAL
  Filled 2015-12-08 (×4): qty 1

## 2015-12-08 MED ORDER — RIVAROXABAN 20 MG PO TABS: 20.0000 mg | ORAL_TABLET | Freq: Every day | ORAL | Status: DC

## 2015-12-08 MED ORDER — DEXAMETHASONE 4 MG PO TABS
ORAL_TABLET | ORAL | Status: AC
  Filled 2015-12-08: qty 1

## 2015-12-08 MED ORDER — DIATRIZOATE MEGLUMINE & SODIUM 66-10 % PO SOLN
ORAL | Status: AC
  Filled 2015-12-08: qty 30

## 2015-12-08 MED ORDER — IOPAMIDOL (ISOVUE-300) INJECTION 61%
100.0000 mL | Freq: Once | INTRAVENOUS | Status: AC | PRN
  Administered 2015-12-08: 80 mL via INTRAVENOUS

## 2015-12-08 MED ORDER — BOOST / RESOURCE BREEZE PO LIQD
1.0000 | Freq: Two times a day (BID) | ORAL | Status: DC
  Administered 2015-12-09: 1 via ORAL

## 2015-12-08 MED ORDER — RIVAROXABAN 20 MG PO TABS
20.0000 mg | ORAL_TABLET | Freq: Every day | ORAL | Status: DC
  Filled 2015-12-08: qty 1

## 2015-12-08 MED ORDER — DEXAMETHASONE 4 MG PO TABS
4.0000 mg | ORAL_TABLET | Freq: Three times a day (TID) | ORAL | Status: DC
  Administered 2015-12-08 – 2015-12-10 (×5): 4 mg via ORAL
  Filled 2015-12-08 (×15): qty 1

## 2015-12-08 NOTE — Progress Notes (Signed)
*  PRELIMINARY RESULTS* Echocardiogram 2D Echocardiogram has been performed.  Harry Franklin 12/08/2015, 4:16 PM

## 2015-12-08 NOTE — Progress Notes (Signed)
Initial Nutrition Assessment  DOCUMENTATION CODES:  Obesity unspecified  INTERVENTION:  Boost Breeze po BID, each supplement provides 250 kcal and 9 grams of protein  If transitions to comfort care/hospice will try to provide any foods he desires  NUTRITION DIAGNOSIS:  Inadequate oral intake related to cancer and cancer related treatments, nausea, poor appetite, profound Fatigue, major taste changes as evidenced by pt report of eating only 1 meal a day recently  GOAL:  Patient will meet greater than or equal to 90% of their needs  MONITOR:  PO intake, Supplement acceptance, Labs ([facility])  REASON FOR ASSESSMENT:  Malnutrition Screening Tool    ASSESSMENT:  75 y/o male PMHx HTN, DVT, CAD, GERD and metastatic bladder cancer diagnosed Nov 2016 s/p 6 cycles chemotherapy. Presents with multiple episodes SOB, DOE, abdominal swelling and fatigue. Seen 2x past week for weakness. Now Admitted for workup of his generalized weakness  Pt has been seen by this RD 6 months ago at which time he reported eating extremely well ("almost too well") and gaining weight.   Today, pt reports that his intake is no longer eating well. He is typically only eating 1 meal a day with an occasional snack. He is currently struggling with numerous cancer/treatment related symptoms, the most significant of which is taste change. "Nothing tastes good". Even the sweet foods which are typically the ones that taste the most normal are affected. He says he tries to eat and drink to stay hydrated but the taste is just too poor. He pointed to his half finished small can of soda and said that was all he has been able to drink today. He cannot tolerate the taste of Ensure. The only recent food he has had that "tasted pretty good" was a [employer], building services.   The other major problem is his profound fatigue and weakness. He is unable to do almost anything without feeling tired. "Morning used to be my best" but now he is tired when  he wakes up. He has had to come to the hospital multiple times recently. He says they give him IVF each admission, but "they dont do anything"  Other problems include nausea (which he says is constant) and constipation. He says he has been instructed to take 120 mg of prednisone if he has diarrhea as instructed by oncology. He says he "bounces off the walls" when he has to do this.   From pt report, he has very poor QOL. He states "I think this is the big decline". He has a lot of questions/frustrations: "I got the one they dont check for" in reference to his routine prostate exams and colonoscopies. The phrase he kept saying after each statement was "I dont know". Palliative should be able to benfeit this patient greatly.   As for nutrition interventions at this time, pt denies having any food requests whatsoever. He was agreeable to trying the Boost breeze that might be better tolerated due to his nausea and taste changes. He did appear to  eat 100% of his dinner yesterday  Per EMR documentation, pt weighed 260-265 lbs in November (reported time of diagnosis).Though his weight is stable, he reports an increase in swelling. Next to his bed is a notepad with daily weights that he keeps "because of my heart failure".   Labs reviewed: Albumin 2.0. WBC: 10.2, Total Pro: 5.6, Anemic   Recent Labs Lab 12/02/15 1600 12/06/15 0947 12/08/15 0500  NA 134* 132* 134*  K 4.1 4.7 4.0  CL 103  100* 102  CO2 26 23 24   BUN 24* 30* 29*  CREATININE 1.35* 1.82* 1.58*  CALCIUM 8.5* 8.2* 7.6*  GLUCOSE 111* 144* 96    Diet Order:  Diet Heart Room service appropriate?: Yes; Fluid consistency:: Thin  Skin:  Reviewed, no issues  Last BM:  7/18  Height:  Ht Readings from Last 1 Encounters:  12/07/15 6\' 4"  (1.93 m)   Weight:  Wt Readings from Last 1 Encounters:  12/08/15 271 lb 6.2 oz (123.1 kg)   Wt Readings from Last 10 Encounters:  12/08/15 271 lb 6.2 oz (123.1 kg)  12/07/15 269 lb (122.018 kg)   12/06/15 290 lb (131.543 kg)  12/02/15 287 lb (130.182 kg)  12/01/15 273 lb 12.8 oz (124.195 kg)  11/11/15 262 lb (118.842 kg)  11/10/15 264 lb (119.75 kg)  10/30/15 260 lb (117.935 kg)  10/27/15 263 lb 4.8 oz (119.432 kg)  10/20/15 266 lb 3.2 oz (120.748 kg)   Ideal Body Weight:  91.82 kg  BMI:  Body mass index is 33.05 kg/(m^2).  Estimated Nutritional Needs:  Kcal:  2300-2500 kcals (25-27 kcal/kg IBW) Protein:  110-130 (1.2-1.4 g/kg IBW) Fluid:  >2 liters  EDUCATION NEEDS:  No education needs identified at this time  Burtis Junes RD, LDN, [place] [facility] Pager: [phone] 12/08/2015 11:46 AM

## 2015-12-08 NOTE — Progress Notes (Addendum)
PROGRESS NOTE        PATIENT DETAILS Name: Harry Franklin Age: 75 y.o. Sex: male Date of Birth: 01/29/1941 Admit Date: 12/07/2015 Admitting Physician Erline Hau, MD [id number] E, MD   Brief Narrative: Patient is a 75 y.o. 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  Subjective: Just feel "lousy". Main complaint is generalized weakness.  Assessment/Plan: Principal Problem: Generalized weakness, failure to thrive syndrome and worsening exertional dyspnea: Unclear etiology without any signs of volume overload, no signs of infection. Suspect this may be just secondary to progression of his underlying stage IV malignancy, anemia of chronic disease. Prior echocardiogram with a profoundly low EF around 25%-but this is in a setting of cardiac arrest-recheck echocardiogram. Await a.m. cortisol levels, urine/blood cultures. Suspect needs to be evaluated by oncology-for prognoses/treatment options-if not felt to be a great candidate then will likely need palliative care evaluation at some point. PT evaluation.  Active Problems: History of chronic systolic heart failure: Looks euvolemic and exam-sleeping flat when I walked in. Stop IV fluids, check echocardiogram. Blood pressure soft-suspect we could hold off on resuming/diuretics for now, follow.  Acute kidney injury on stage III chronic kidney disease: Creatinine improved following IV fluid hydration. Appears euvolemic-old off on starting diuretics-follow I's.  Anemia: Suspect secondary to chronic kidney disease and underlying malignancy. No overt evidence of blood loss. May be contributing to some of his symptoms of generalized weakness and  exertional dyspnea. Follow.  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 Recent CT angiogram chest on 12/01/15 and negative for pulmonary embolism.  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  Hypertension: Blood pressure soft-continue to hold antihypertensives.  History of paroxysmal atrial fibrillation: Chads 2 vascular for at least 2-3, beta blocker currently on hold-continue Xarelto.  GERD: Continue PPI  Dyslipidemia: Continue statin  DVT Prophylaxis: Full dose anticoagulation with Xarelto  Code Status: Full code  Family Communication: None at bedside-patient is awake and alert-and understanding of the above noted plan.  Disposition Plan: Remain obtain PT evaluation-may need short-term SNF placement for subacute rehabilitation  Antimicrobial agents: None  Procedures: None  CONSULTS:  hematology/oncology  Time spent: 25 minutes-Greater than 50% of this time was spent in counseling, explanation of diagnosis, planning of further management, and coordination of care.  MEDICATIONS: Anti-infectives    None      Scheduled Meds: . atorvastatin  80 mg Oral QHS  . diphenhydrAMINE  25 mg Oral QHS  . feeding supplement (ENSURE ENLIVE)  237 mL Oral BID BM  . pantoprazole  40 mg Oral Daily  . rivaroxaban  20 mg Oral Q supper  . sodium chloride flush  3 mL Intravenous Q12H   Continuous Infusions: . sodium chloride 65 mL/hr at 12/07/15 1815   PRN Meds:.acetaminophen **OR** acetaminophen, ondansetron **OR** ondansetron (ZOFRAN) IV, polyethylene glycol   PHYSICAL EXAM: Vital signs: Filed Vitals:   12/07/15 1512 12/07/15 2120 12/08/15 0618  BP: 107/51 100/48   Pulse: 84 82   Temp: 99.1 F (37.3 C) 100.1 F (37.8  C)   TempSrc: Oral Oral   Resp: 18 20   Height: 6\' 4"  (1.93 m)    Weight: 122.7  kg (270 lb 8.1 oz)  123.1 kg (271 lb 6.2 oz)  SpO2: 100% 100%    Filed Weights   12/07/15 1512 12/08/15 0618  Weight: 122.7 kg (270 lb 8.1 oz) 123.1 kg (271 lb 6.2 oz)   Body mass index is 33.05 kg/(m^2).   Gen Exam: Awake and alert with clear speech. Not in any distress  Neck: Supple, No JVD.   Chest: B/L Clear.   CVS: S1 S2 Regular, no murmurs.  Abdomen: soft, BS +, non tender, non distended.  Extremities: no edema, lower extremities warm to touch. Neurologic: Non Focal.   Skin: No Rash or lesions   Wounds: N/A.  LABORATORY DATA: CBC:  Recent Labs Lab 12/01/15 1003 12/02/15 1600 12/06/15 0947 12/08/15 0500  WBC 7.0 6.2 12.4* 10.2  NEUTROABS 5.0 4.5  --   --   HGB 9.5* 9.3* 9.2* 8.5*  HCT 29.8* 28.8* 28.3* 26.4*  MCV 96.4 96.3 96.6 96.7  PLT 200 183 207 [id number]    Basic Metabolic Panel:  Recent Labs Lab 12/01/15 1003 12/02/15 1600 12/06/15 0947 12/08/15 0500  NA 134* 134* 132* 134*  K 4.3 4.1 4.7 4.0  CL 102 103 100* 102  CO2 25 26 23 24   GLUCOSE 112* 111* 144* 96  BUN 25* 24* 30* 29*  CREATININE 1.41* 1.35* 1.82* 1.58*  CALCIUM 8.4* 8.5* 8.2* 7.6*    GFR: Estimated Creatinine Clearance: 60.6 mL/min (by C-G formula based on Cr of 1.58).  Liver Function Tests:  Recent Labs Lab 12/01/15 1003 12/02/15 1600 12/06/15 0947 12/08/15 0500  AST 68* 79* 86* 79*  ALT 62 64* 55 44  ALKPHOS 232* 255* 308* 276*  BILITOT 0.8 0.7 1.1 1.1  PROT 6.5 6.3* 6.3* 5.6*  ALBUMIN 2.3* 2.3* 2.2* 2.0*   No results for input(s): LIPASE, AMYLASE in the last 168 hours. No results for input(s): AMMONIA in the last 168 hours.  Coagulation Profile: No results for input(s): INR, PROTIME in the last 168 hours.  Cardiac Enzymes: No results for input(s): CKTOTAL, CKMB, CKMBINDEX, TROPONINI in the last 168 hours.  BNP (last 3 results) No results for input(s): PROBNP in the last 8760 hours.  HbA1C: No results for input(s): HGBA1C in the last 72 hours.  CBG: No results  for input(s): GLUCAP in the last 168 hours.  Lipid Profile: No results for input(s): CHOL, HDL, LDLCALC, TRIG, CHOLHDL, LDLDIRECT in the last 72 hours.  Thyroid Function Tests: No results for input(s): TSH, T4TOTAL, FREET4, T3FREE, THYROIDAB in the last 72 hours.  Anemia Panel: No results for input(s): VITAMINB12, FOLATE, FERRITIN, TIBC, IRON, RETICCTPCT in the last 72 hours.  Urine analysis:    Component Value Date/Time   COLORURINE ORANGE* 12/06/2015 [place] 12/06/2015 1208   LABSPEC 1.020 12/06/2015 1208   PHURINE 5.0 12/06/2015 1208   GLUCOSEU NEGATIVE 12/06/2015 1208   HGBUR NEGATIVE 12/06/2015 [place] 12/06/2015 1208   KETONESUR TRACE* 12/06/2015 1208   PROTEINUR 30* 12/06/2015 1208   UROBILINOGEN 0.2 06/30/2013 0543   NITRITE NEGATIVE 12/06/2015 1208   LEUKOCYTESUR NEGATIVE 12/06/2015 1208    Sepsis Labs: Lactic Acid, Venous    Component Value Date/Time   LATICACIDVEN 1.5 12/06/2015 1048    MICROBIOLOGY: Recent Results (from the past 240 hour(s))  Blood culture (routine x 2)     Status: None (Preliminary  result)   Collection Time: 12/06/15 10:48 AM  Result Value Ref Range Status   Specimen Description BLOOD RIGHT ANTECUBITAL  Final   Special Requests BOTTLES DRAWN AEROBIC AND ANAEROBIC 4CC  Final   Culture NO GROWTH < 24 HOURS  Final   Report Status PENDING  Incomplete  Blood culture (routine x 2)     Status: None (Preliminary result)   Collection Time: 12/06/15 10:49 AM  Result Value Ref Range Status   Specimen Description BLOOD LEFT WRIST  Final   Special Requests   Final    BOTTLES DRAWN AEROBIC AND ANAEROBIC AEB=6CC ANA=4CC   Culture NO GROWTH < 24 HOURS  Final   Report Status PENDING  Incomplete    RADIOLOGY STUDIES/RESULTS: X-ray Chest Pa And Lateral  12/07/2015  CLINICAL DATA:  Increased shortness of breath, metastatic bladder carcinoma. EXAM: CHEST  2 VIEW COMPARISON:  10/31/2015 FINDINGS: Right IJ power port  catheter tip lower SVC. Stable heart size and vascularity. Chronic right apical dense bandlike scarring. No focal pneumonia, collapse or consolidation. No significant edema pattern, or pneumothorax. Small right effusion evident, better demonstrated on 12/01/2015 CT. IMPRESSION: Stable chest exam with exception of a small right pleural effusion. No other superimposed acute process. Electronically Signed   By: Jerilynn Mages.  Shick M.D.   On: 12/07/2015 17:59   Ct Angio Chest Pe W Or Wo Contrast  12/01/2015  CLINICAL DATA:  Shortness of breath, sudden onset today. History of stage IV urothelial cancer. EXAM: CT ANGIOGRAPHY CHEST WITH CONTRAST TECHNIQUE: Multidetector CT imaging of the chest was performed using the standard protocol during bolus administration of intravenous contrast. Multiplanar CT image reconstructions and MIPs were obtained to evaluate the vascular anatomy. CONTRAST:  100 cc Isovue 370 IV COMPARISON:  10/11/2015 FINDINGS: Cardiovascular: No filling defects in the pulmonary arteries to suggest pulmonary emboli. Respiratory motion obscures the vessels in the lung apices. Dense diffuse coronary artery calcifications. Heart is borderline in size. Aorta is normal caliber with scattered aortic calcifications. Mediastinum/Nodes: Esophagus is fluid-filled and mildly dilated, possibly related to reflux or dysmotility. No mediastinal, hilar, or axillary adenopathy. Lungs/Pleura: Stable 5 mm nodule in the right middle lobe. Small right pleural effusion, increased since prior study. Small nodule at the left lung base measures 6 mm and is stable on image 103. No new or enlarging pulmonary nodules. Biapical scarring noted. Upper Abdomen: Enlarging hepatic masses in the visualized liver. Hepatic lesion on image 143 measures 2.7 cm compared with 6 mm previously. Central right hepatic mass is partially imaged, measuring approximately 6.7 cm this measured up to 3.3 cm previously. These hepatic lesions are difficult to  visualize given the arterial timing for the CTA chest. The liver is not imaged in its entirety. Left adrenal enlargement again noted, unchanged. Musculoskeletal: Chest wall soft tissues are unremarkable. No acute bony abnormality or focal bone lesion. Degenerative changes throughout the thoracic spine. Review of the MIP images confirms the above findings. IMPRESSION: No evidence of pulmonary embolus. Small right pleural effusion, slightly increased since prior study. Partial visualization of the liver demonstrates enlarging hepatic lesions/metastases. Stable small scattered pulmonary nodules as above. Severe diffuse coronary artery disease. Dilated, fluid-filled esophagus, possibly related to reflux or dysmotility. Electronically Signed   By: Rolm Baptise M.D.   On: 12/01/2015 13:40     LOS: 1 day   Oren Binet, MD  [facility] Pager:[phone]  If 7PM-7AM, please contact night-coverage [email or web address] Password [facility] 12/08/2015, 8:17 AM

## 2015-12-08 NOTE — Plan of Care (Signed)
Unable to see today. Will see patient on 7/20. Thank you, Aniceto Boss

## 2015-12-08 NOTE — Care Management Important Message (Signed)
Important Message  Patient Details  Name: Harry Franklin MRN: [id number] Date of Birth: December 16, 1940   Medicare Important Message Given:  Yes    Smitty Ackerley, Chauncey Reading, RN 12/08/2015, 4:37 PM

## 2015-12-08 NOTE — Progress Notes (Signed)
PT Cancellation Note  Patient Details Name: DAMEER LAMON MRN: [id number] DOB: 04-16-1941   Cancelled Treatment:    Reason Eval/Treat Not Completed: Patient declined, no reason specified (Pt expressed that he is not up for therapy today.  "My get up and go has got up and left." Pt requests that PT return tomorrow. )   Eustaquio Maize Delmore Sear, PT, DPT X: [phone]

## 2015-12-08 NOTE — Consult Note (Signed)
[facility] Consultation Oncology  Name: Harry Franklin      MRN: [id number]    Location: A330/A330-01  Date: 12/08/2015 Time:5:27 PM   REFERRING PHYSICIAN:  Oren Binet, MD ([facility])  REASON FOR CONSULT:  Failure to thrive   DIAGNOSIS:  Metastatic urothelial cancer  HISTORY OF PRESENT ILLNESS:   Harry Franklin is a white American, 75 yo who is well known to the [facility] where he is undergoing salvage therapy with immunotherapy Beryle Flock).    Metastatic urothelial carcinoma (HCC) (Resolved)   04/02/2015 Imaging CT abd/pelvis- Innumberable hepatic masses, B/L adrenal mets, abdominopelvic lymphadenopathy, LLL lung lesion 6 mm, Lytic lesion of right L2 vertebral bosy, circumferential wall thickening of lower thoracic esophagus, mild diffuse bladder wall thickening.   04/02/2015 Pathology Results Urine Cytology- cells present suspicious for malignancy.  Suspicious for high-grade urothelial carcinoma.   04/08/2015 Procedure US guided biopsy of hepatic lesion   04/08/2015 Pathology Results Liver, needle/core biopsy, right - METASTATIC HIGH GRADE CARCINOMA,   04/14/2015 - 07/21/2015 Chemotherapy Carboplatin/Gemcitabine Days 1, 8 every 21 days   05/25/2015 Imaging CT with interval response to therapy, improvement in multifocal liver mets, improvement in enlarged abdominal and pelvic adenopahty and bilateral adrenal gland metastatese, stable appearance of lytic lesion in L2   06/02/2015 Miscellaneous Xgeva to reduce the risk of SLE.  120 mg SQ every month.   06/08/2015 - 06/11/2015 Hospital Admission NSTEMI, Afib with RVR, Hypertension, Chemotherapy induced pancyopenia   06/28/2015 Treatment Plan Change Carboplatin dose reduced to AUC of 4 and Gemcitabine dose reduced by 20%   08/02/2015 - 08/09/2015 Hospital Admission Respiratory Arrest, acute bilateral DVT, presumed PE. IVC filter placed secondary to progressive anemia on anticoagulation   08/10/2015 Imaging CT CAP-  Significant interval treatment response.   10/11/2015 Imaging CT CAP- progression of metastatic disease in the liver, with enlargement of the previously noted index lesion in segment 4B, and 2 new lesions adjacent to the gallbladder fossa, as detailed above.    10/11/2015 Progression CT scan demonstrates progression of disease.   10/20/2015 -  Chemotherapy Keytruda   12/01/2015 Imaging CT angio chest- No evidence of pulmonary embolus.  Partial visualization of the liver demonstrates enlarging hepatic lesions/metastases.   12/01/2015 Treatment Plan Change Keytruda deferred x 7 days   Patient is admitted to the hospital for fatigue, DOE, and abdominal swelling.  He was scheduled for an outpatient CT scan today, but this was not performed due to hospitalization.  As a result, an inpatient CT scan of abdomen and pelvis was performed.  I personally reviewed and went over radiographic studies with the patient.  The results are noted within this dictation.  CT imaging demonstrates gross progression of disease as suspected.    We reviewed these results with the patient and family.  Goals of care are discussed and he is clear that he desires quality of life, not quantity.  He wants to be a DNR.  We discussed the natural course of his disease.  At this time, he wants to learn of his options with Hospice at home.    PAST MEDICAL HISTORY:   Past Medical History  Diagnosis Date  . GERD (gastroesophageal reflux disease)   . Hypokalemia   . Hypertension   . Cataract   . Arthritis   . Macular degeneration, age related   . DVT (deep venous thrombosis) ([place])     developed after traveling x 1, developed another after left foot surgery  . Hydrocele in  adult   . Metastatic urothelial carcinoma ([place]) 04/12/2015  . Bone metastases ([place]) 09/21/2015    ALLERGIES: Allergies  Allergen Reactions  . Neulasta [Pegfilgrastim] Hives    Pt reports breaking out in hives less than 24 hours after taking neulasta. Hives appear  on arms and legs.  . Rabeprazole     Other reaction(s): DIARRHEA  . Vancomycin Hives and Rash      MEDICATIONS: I have reviewed the patient's current medications.     PAST SURGICAL HISTORY Past Surgical History  Procedure Laterality Date  . Replacement total knee    . Appendectomy    . Hammer toe surgery Left   . Lapidus procedure    . Hallux fusion    . Colonoscopy  03/31/05  . Colonoscopy N/A 10/02/2014    LNL:GXQJJHE diverticulosis  . Esophagogastroduodenoscopy N/A 10/02/2014    RDE:YCXKG ulcerative reflux/s/p dilation/large HH  . Maloney dilation N/A 10/02/2014    Procedure: Venia Minks DILATION;  Surgeon: Daneil Dolin, MD;  [facility];  Service: Endoscopy;  Laterality: N/A;  . Esophagogastroduodenoscopy N/A 01/29/2015    Procedure: ESOPHAGOGASTRODUODENOSCOPY (EGD);  Surgeon: Daneil Dolin, MD;  Location: [facility];  Service: Endoscopy;  Laterality: N/A;  0800-rescheduled to 9/9 @ 915 [place] notified pt  . Esophageal dilation  01/29/2015    Procedure: ESOPHAGEAL DILATION;  Surgeon: Daneil Dolin, MD;  Location: [facility];  Service: Endoscopy;;  . Portacath placement Left 04/13/15    FAMILY HISTORY: Family History  Problem Relation Age of Onset  . Colon cancer Maternal Uncle   . Heart disease Father     SOCIAL HISTORY:  reports that he quit smoking about 31 years ago. His smoking use included Cigarettes. He has a 31 pack-year smoking history. He has never used smokeless tobacco. He reports that he does not drink alcohol or use illicit drugs.  PERFORMANCE STATUS: The patient's performance status is 3 - Symptomatic, >50% confined to bed  PHYSICAL EXAM: Most Recent Vital Signs: Blood pressure 110/56, pulse 81, temperature 99.1 F (37.3 C), temperature source Oral, resp. rate 20, height '6\' 4"'$  (1.93 m), weight 271 lb 6.2 oz (123.1 kg), SpO2 99 %. General appearance: alert, fatigued, mild distress and many family members at the bedside. Head: Normocephalic,  without obvious abnormality, atraumatic Eyes: negative findings: conjunctivae and sclerae normal Throat: abnormal findings: dentition: edentulous Back: symmetric, no curvature. ROM normal. No CVA tenderness. Abdomen: abnormal findings:  distended Extremities: extremities normal, atraumatic, no cyanosis or edema Skin: Skin color, texture, turgor normal. No rashes or lesions Neurologic: Grossly normal  LABORATORY DATA:  Results for orders placed or performed during the hospital encounter of 12/07/15 (from the past 48 hour(s))  Comprehensive metabolic panel     Status: Abnormal   Collection Time: 12/08/15  5:00 AM  Result Value Ref Range   Sodium 134 (L) 135 - 145 mmol/L   Potassium 4.0 3.5 - 5.1 mmol/L   Chloride 102 101 - 111 mmol/L   CO2 24 22 - 32 mmol/L   Glucose, Bld 96 65 - 99 mg/dL   BUN 29 (H) 6 - 20 mg/dL   Creatinine, Ser 1.58 (H) 0.61 - 1.24 mg/dL   Calcium 7.6 (L) 8.9 - 10.3 mg/dL   Total Protein 5.6 (L) 6.5 - 8.1 g/dL   Albumin 2.0 (L) 3.5 - 5.0 g/dL   AST 79 (H) 15 - 41 U/L   ALT 44 17 - 63 U/L   Alkaline Phosphatase 276 (H) 38 -  126 U/L   Total Bilirubin 1.1 0.3 - 1.2 mg/dL   GFR calc non Af Amer 42 (L) >60 mL/min   GFR calc Af Amer 49 (L) >60 mL/min    Comment: (NOTE) The eGFR has been calculated using the CKD EPI equation. This calculation has not been validated in all clinical situations. eGFR's persistently <60 mL/min signify possible Chronic Kidney Disease.    Anion gap 8 5 - 15  CBC     Status: Abnormal   Collection Time: 12/08/15  5:00 AM  Result Value Ref Range   WBC 10.2 4.0 - 10.5 K/uL   RBC 2.73 (L) 4.22 - 5.81 MIL/uL   Hemoglobin 8.5 (L) 13.0 - 17.0 g/dL   HCT 26.4 (L) 39.0 - 52.0 %   MCV 96.7 78.0 - 100.0 fL   MCH 31.1 26.0 - 34.0 pg   MCHC 32.2 30.0 - 36.0 g/dL   RDW 14.9 11.5 - 15.5 %   Platelets 202 150 - 400 K/uL  Cortisol-am, blood     Status: None   Collection Time: 12/08/15  5:00 AM  Result Value Ref Range   Cortisol - AM 22.0 6.7 -  22.6 ug/dL    Comment: Performed at [facility]: X-ray Chest Pa And Lateral  12/07/2015  CLINICAL DATA:  Increased shortness of breath, metastatic bladder carcinoma. EXAM: CHEST  2 VIEW COMPARISON:  10/31/2015 FINDINGS: Right IJ power port catheter tip lower SVC. Stable heart size and vascularity. Chronic right apical dense bandlike scarring. No focal pneumonia, collapse or consolidation. No significant edema pattern, or pneumothorax. Small right effusion evident, better demonstrated on 12/01/2015 CT. IMPRESSION: Stable chest exam with exception of a small right pleural effusion. No other superimposed acute process. Electronically Signed   By: Jerilynn Mages.  Shick M.D.   On: 12/07/2015 17:59   Ct Abdomen Pelvis W Contrast  12/08/2015  CLINICAL DATA:  Metastatic bladder cancer, abdominal pain EXAM: CT ABDOMEN AND PELVIS WITH CONTRAST TECHNIQUE: Multidetector CT imaging of the abdomen and pelvis was performed using the standard protocol following bolus administration of intravenous contrast. CONTRAST:  59m ISOVUE-300 IOPAMIDOL (ISOVUE-300) INJECTION 61% COMPARISON:  CT abdomen pelvis dated 10/11/2015 FINDINGS: Lower chest:  Small right and trace left pleural effusions. Hepatobiliary: Progression of multifocal hepatic metastases. Index lesions on the current study include: -- 1.8 x 2.1 cm lesion in segment 2 (series 8/image 15) -- 2.9 x 3.3 cm lesion in segment 4A (series 8/image 16) --9.6 x 6.6 cm lesion in segment 8 (series 8/ image 23) --9.4 x 6.3 cm lesion in segment 5 (series 8/ image 32), previously 3.0 x 5.1 cm --6.1 x 8.5 cm lesion in segment 6 (series 8/ image 33) Distended gallbladder with irregular wall thickening and intraluminal soft tissue/mass (series 8/images 28, 34, and 39). This appearance is worrisome for metastatic disease to the gallbladder, less likely primary gallbladder neoplasm. No intrahepatic or extrahepatic ductal dilatation. Pancreas: Within normal limits. Spleen:  Within normal limits. 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 Possible 1.4 cm inferior right adrenal nodule (series 8/ image 28), indeterminate. Kidneys are within normal limits.  No hydronephrosis. Bladder is underdistended. Stomach/Bowel: Stomach is notable for a small to moderate hiatal hernia. No evidence of bowel obstruction. Appendix is not discretely visualized. Vascular/Lymphatic: Atherosclerotic calcifications of the abdominal aorta and branch vessels. No evidence of abdominal aortic aneurysm. Mildly prominent portacaval node measuring 12 mm short axis (series 8/image 28). Otherwise,  no suspicious abdominopelvic lymphadenopathy. Reproductive: Prostate is unremarkable. Other: Moderate abdominopelvic ascites, new. Associated peritoneal disease/ omental caking, particularly beneath the anterior abdominal wall (series 8/ images 29, 38, and 42), with a dominant 2.4 x 1.7 cm implant (series 8/ image 35), new. Additional peritoneal disease in the pelvic cul-de-sac (series 8/ image 81). Musculoskeletal: Degenerative changes of the visualized thoracolumbar spine. Mild superior endplate compression fracture deformities at T12 and L1, unchanged. Moderate central compression fracture deformity at L2. Mild central compression fracture deformity at L3. These findings are unchanged. IMPRESSION: Progression of multifocal hepatic metastases, with index lesions as above, measuring up to 9.6 cm. Suspected metastasis to the gallbladder. Progression of left adrenal metastasis. Possible right adrenal metastasis. Moderate on pelvic ascites, new. Associated peritoneal disease/omental caking, new. Small right and trace left pleural effusions. Additional ancillary findings as above. Electronically Signed   By: Julian Hy M.D.   On: 12/08/2015 14:59       PATHOLOGY:  Nothing new  ASSESSMENT/PLAN:  75 yo white man with metastatic urothelial cancer, now with frank progression  of disease despite immunotherapy, Keytruda, having started on 10/20/2015.  CT abd/pelvis on 12/08/2015 demonstrates significant progression of disease with a large hepatic lesion measuring 9.6 cm, gallbladder metastasis, progression of left adrenal metastasis, possible right adrenal metastasis, moderate pelvic ascites, and peritoneal disease/omental caking that is new.  We discussed goals of care.  He is clear that he wants QOL, not quantity.  He is interested in Hospice at home.  Will consult case management to help with referral to [facility].  This consult MUST be done before the patient is discharged.  No exceptions.  Pain is not well controlled and therefore we will alter pain management.  HOWEVER, he has a history of hypotension with morphine.  As a result, pain medication options are limited.  Will order Tramadol 50 mg every 6 hours PRN pain.  Depending on tolerability and response, we may need to get cardiology opinion regarding antihypertensive regimen.  Abdominal discomfort, secondary to ascites.  Order placed for limited US and US-guided paracentesis.  Xarelto for tonight is discontinued.  Xarelto re-ordered for tomorrow evening after paracentesis.  Order for Dexamethasone for hepatic capsular pain.  Patient MUST not be discharged until pain is better controlled.  Will also ask for a PleurX catheter placement if significant ascites is removed given peritoneal disease/omental caking.  D/C cardiac monitoring, I&O, and weight checks.  Order placed for TSH given patient complaint of" "cold all the time."  DNR  Palliative consult D/C'd  All questions were answered. The patient knows to call the clinic with any problems, questions or concerns. We can certainly see the patient much sooner if necessary.  Patient and plan discussed with Dr. Ancil Linsey and she is in agreement with the aforementioned.   KEFALAS,THOMAS  12/08/2015 6:17 PM   As detailed.  Discussed with patient  and family goals of care. Reviewed imaging in detail and progression through [place].  He had difficulty tolerating front line therapy. Given current PS and difficulties with prior chemotherapy have recommended palliative options. Family and patient are agreeable. Physical exam as detailed. Abdomen is distended but still somewhat soft. Hypoactive BS throughout. Consult hospice. Pain control. DNR. Donald Pore MD

## 2015-12-09 ENCOUNTER — Inpatient Hospital Stay (HOSPITAL_COMMUNITY): Payer: Medicare HMO

## 2015-12-09 ENCOUNTER — Ambulatory Visit (HOSPITAL_COMMUNITY): Payer: Medicare HMO | Admitting: Oncology

## 2015-12-09 DIAGNOSIS — C679 Malignant neoplasm of bladder, unspecified: Principal | ICD-10-CM

## 2015-12-09 LAB — BASIC METABOLIC PANEL
Anion gap: 8 (ref 5–15)
BUN: 24 mg/dL — AB (ref 6–20)
CALCIUM: 7.4 mg/dL — AB (ref 8.9–10.3)
CHLORIDE: 103 mmol/L (ref 101–111)
CO2: 23 mmol/L (ref 22–32)
CREATININE: 1.27 mg/dL — AB (ref 0.61–1.24)
GFR calc non Af Amer: 55 mL/min — ABNORMAL LOW (ref >60)
Glucose, Bld: 121 mg/dL — ABNORMAL HIGH (ref 65–99)
Potassium: 4.3 mmol/L (ref 3.5–5.1)
SODIUM: 134 mmol/L — AB (ref 135–145)

## 2015-12-09 LAB — CBC
HCT: 26.7 % — ABNORMAL LOW (ref 39.0–52.0)
Hemoglobin: 8.7 g/dL — ABNORMAL LOW (ref 13.0–17.0)
MCH: 31.1 pg (ref 26.0–34.0)
MCHC: 32.6 g/dL (ref 30.0–36.0)
MCV: 95.4 fL (ref 78.0–100.0)
PLATELETS: 228 10*3/uL (ref 150–400)
RBC: 2.8 MIL/uL — ABNORMAL LOW (ref 4.22–5.81)
RDW: 14.6 % (ref 11.5–15.5)
WBC: 9.4 10*3/uL (ref 4.0–10.5)

## 2015-12-09 MED ORDER — POTASSIUM CHLORIDE ER 8 MEQ PO TBCR
8.0000 meq | EXTENDED_RELEASE_TABLET | Freq: Every day | ORAL | Status: DC
  Filled 2015-12-09 (×2): qty 1

## 2015-12-09 MED ORDER — FUROSEMIDE 20 MG PO TABS
20.0000 mg | ORAL_TABLET | Freq: Every day | ORAL | Status: DC
  Administered 2015-12-09 – 2015-12-10 (×2): 20 mg via ORAL
  Filled 2015-12-09 (×2): qty 1

## 2015-12-09 NOTE — Care Management (Signed)
[facility] has received referral for patient. Care will be assumed once patient is discharged. Patient may need a pleur-x catheter placed prior to discharge, hospice aware. Will notify [place] when patient is discharged.

## 2015-12-09 NOTE — Progress Notes (Signed)
Nutrition Follow-up  DOCUMENTATION CODES:  Obesity unspecified  INTERVENTION:  D/C Boost breeze as pt is going [facility]/Hospice  RD took pt food requests to promote comfort.   NUTRITION DIAGNOSIS:  Inadequate oral intake   ongoing  GOAL:  Patient will meet greater than or equal to 90% of their needs  MONITOR:  PO intake, Supplement acceptance, Labs (GOC)  ASSESSMENT:  75 y/o male PMHx HTN, DVT, CAD, GERD and metastatic bladder cancer diagnosed Nov 2016 s/p 6 cycles chemotherapy. Presents with multiple episodes SOB, DOE, abdominal swelling and fatigue. Seen 2x past week for weakness. Now Admitted for workup of his generalized weakness  Interval Hx per 7/20: Pt has elected to go hospice care. May be receiving therapeutic paracentesis  RD has brief follow up patient. Asked what comfort foods he desired. He notes that he is very satisfied with the current meals and notes the good meal quality and service.   Labs reviewed: Albumin 2.0. WBC: 9.4,  Anemic   Recent Labs Lab 12/06/15 0947 12/08/15 0500 12/09/15 0516  NA 132* 134* 134*  K 4.7 4.0 4.3  CL 100* 102 103  CO2 23 24 23   BUN 30* 29* 24*  CREATININE 1.82* 1.58* 1.27*  CALCIUM 8.2* 7.6* 7.4*  GLUCOSE 144* 96 121*    Diet Order:  Diet Heart Room service appropriate?: Yes; Fluid consistency:: Thin  Skin:  Reviewed, no issues  Last BM:  7/19  Height:  Ht Readings from Last 1 Encounters:  12/07/15 6\' 4"  (1.93 m)   Weight:  Wt Readings from Last 1 Encounters:  12/09/15 267 lb 9.6 oz (121.383 kg)   Wt Readings from Last 10 Encounters:  12/09/15 267 lb 9.6 oz (121.383 kg)  12/07/15 269 lb (122.018 kg)  12/06/15 290 lb (131.543 kg)  12/02/15 287 lb (130.182 kg)  12/01/15 273 lb 12.8 oz (124.195 kg)  11/11/15 262 lb (118.842 kg)  11/10/15 264 lb (119.75 kg)  10/30/15 260 lb (117.935 kg)  10/27/15 263 lb 4.8 oz (119.432 kg)  10/20/15 266 lb 3.2 oz (120.748 kg)   Ideal Body Weight:  91.82 kg  BMI:  Body mass  index is 32.59 kg/(m^2).  Estimated Nutritional Needs:  Kcal:  2300-2500 kcals (25-27 kcal/kg IBW) Protein:  110-130 (1.2-1.4 g/kg IBW) Fluid:  >2 liters  EDUCATION NEEDS:  No education needs identified at this time  Harry Franklin RD, LDN, [place] [facility] Pager: [phone] 12/09/2015 12:44 PM

## 2015-12-09 NOTE — Progress Notes (Signed)
Urine with pink tinge.  MD made aware, will hold xarelto again this evening.

## 2015-12-09 NOTE — Progress Notes (Signed)
PROGRESS NOTE        PATIENT DETAILS Name: Harry Franklin Age: 75 y.o. Sex: male Date of Birth: [birth year] Admit Date: 12/07/2015 Admitting Physician Erline Hau, MD [id number] E, MD   Brief Narrative: Patient is a 75 y.o. 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 Seen by oncology-now a DO NOT RESUSCITATE, plans are to discharge home with hospice care in the next few days.  Subjective: Pain in his abdomen is better controlled with just tramadol. Continues to complain of generalized weakness.   Assessment/Plan: Principal Problem: Generalized weakness, failure to thrive syndrome and worsening exertional dyspnea: Suspect due to progression of underlying metastatic bladder cancer, and from anemia of chronic disease. There is no signs of volume overload or signs of infection (blood cultures negative). A.m. cortisol levels appear appropriate therefore doubt adrenal insufficiency. Although a prior echocardiogram on 08/03/15 showed EF around 25%, echo on 12/08/15 showed improvement of EF to around 40%. Evaluated by oncology, recommendations are to proceed with home hospice on discharge.  Active Problems: History of chronic systolic heart failure: Looks euvolemic and exam-sleeping flat. Echocardiogram on 7/19 showed improvement of EF to around 40% (prior echo in March EF was 25%). Weight appears stable, blood pressure has improved somewhat, likely could be restarted on diuretics on discharge.   Acute kidney injury on stage III chronic kidney disease: Creatinine improved following IV fluid hydration-thin him back to usual baseline. Likely can restart diuretics on  discharge.  Anemia: Suspect secondary to chronic kidney disease and underlying malignancy. No overt evidence of blood loss. May be contributing to some of his symptoms of generalized weakness and exertional dyspnea. Follow.  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 Recent CT angiogram chest on 12/01/15 and negative for pulmonary embolism.  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 Oncology evaluation completed, recommendations are to discharge with hospice. Patient now a DO NOT RESUSCITATE.   Ascites: Likely malignant-awaiting limited ultrasound abdomen to see if ascites can be drained. .  Hypertension: Blood pressure although soft-better than yesterday-continue to hold antihypertensives.  History of paroxysmal atrial fibrillation: Chads 2 vascular for at least 2-3, beta blocker currently on hold-continue Xarelto.  GERD: Continue PPI  Dyslipidemia: Continue statin  DVT Prophylaxis: Full dose anticoagulation with Xarelto  Code Status: Full code  Family Communication: None at bedside-patient is awake and alert-and understanding of the above noted plan. Offered to speak with family-but he claims that there was a large number of family members at bedside when he discussed care with oncology and does not feel that my call would help at this time.  Disposition Plan: Home with hospice when cleared by oncology   Antimicrobial agents: None  Procedures: None  CONSULTS:  hematology/oncology  Time spent: 25 minutes-Greater than 50% of this time was spent in counseling, explanation of diagnosis, planning of further management, and coordination of care.  MEDICATIONS: Anti-infectives    None      Scheduled Meds: . atorvastatin  80 mg Oral QHS  . dexamethasone  4 mg Oral Q8H  .  diphenhydrAMINE  25 mg Oral QHS  . feeding supplement  1 Container Oral BID BM  . pantoprazole  40 mg Oral Daily  . rivaroxaban  20 mg Oral Daily  . sodium chloride flush  3 mL Intravenous Q12H   Continuous Infusions:   PRN Meds:.acetaminophen **OR** acetaminophen, ondansetron **OR** ondansetron (ZOFRAN) IV, polyethylene glycol, traMADol   PHYSICAL EXAM: Vital signs: Filed Vitals:   12/08/15 1317 12/08/15 1617 12/08/15 2134 12/09/15 0631  BP: 111/52 110/56 111/51 127/64  Pulse: 81 81 81 74  Temp: 99.4 F (37.4 C) 99.1 F (37.3 C) 99.2 F (37.3 C) 97.6 F (36.4 C)  TempSrc: Oral Oral Oral Oral  Resp: 18 20 18 20   Height:      Weight:    121.383 kg (267 lb 9.6 oz)  SpO2: 100% 99% 97% 98%   Filed Weights   12/07/15 1512 12/08/15 0618 12/09/15 0631  Weight: 122.7 kg (270 lb 8.1 oz) 123.1 kg (271 lb 6.2 oz) 121.383 kg (267 lb 9.6 oz)   Body mass index is 32.59 kg/(m^2).   Gen Exam: Awake and alert with clear speech. Not in any distress  Neck: Supple, No JVD.   Chest: B/L Clear.   CVS: S1 S2 Regular, no murmurs.  Abdomen: soft, BS +, non tender, non distended.  Extremities: no edema, lower extremities warm to touch. Neurologic: Non Focal.   Skin: No Rash or lesions   Wounds: N/A.  LABORATORY DATA: CBC:  Recent Labs Lab 12/02/15 1600 12/06/15 0947 12/08/15 0500 12/09/15 0516  WBC 6.2 12.4* 10.2 9.4  NEUTROABS 4.5  --   --   --   HGB 9.3* 9.2* 8.5* 8.7*  HCT 28.8* 28.3* 26.4* 26.7*  MCV 96.3 96.6 96.7 95.4  PLT 183 207 202 [id number]    Basic Metabolic Panel:  Recent Labs Lab 12/02/15 1600 12/06/15 0947 12/08/15 0500 12/09/15 0516  NA 134* 132* 134* 134*  K 4.1 4.7 4.0 4.3  CL 103 100* 102 103  CO2 26 23 24 23   GLUCOSE 111* 144* 96 121*  BUN 24* 30* 29* 24*  CREATININE 1.35* 1.82* 1.58* 1.27*  CALCIUM 8.5* 8.2* 7.6* 7.4*    GFR: Estimated Creatinine Clearance: 74.8 mL/min (by C-G formula based on Cr of 1.27).  Liver Function Tests:  Recent  Labs Lab 12/02/15 1600 12/06/15 0947 12/08/15 0500  AST 79* 86* 79*  ALT 64* 55 44  ALKPHOS 255* 308* 276*  BILITOT 0.7 1.1 1.1  PROT 6.3* 6.3* 5.6*  ALBUMIN 2.3* 2.2* 2.0*   No results for input(s): LIPASE, AMYLASE in the last 168 hours. No results for input(s): AMMONIA in the last 168 hours.  Coagulation Profile: No results for input(s): INR, PROTIME in the last 168 hours.  Cardiac Enzymes: No results for input(s): CKTOTAL, CKMB, CKMBINDEX, TROPONINI in the last 168 hours.  BNP (last 3 results) No results for input(s): PROBNP in the last 8760 hours.  HbA1C: No results for input(s): HGBA1C in the last 72 hours.  CBG: No results for input(s): GLUCAP in the last 168 hours.  Lipid Profile: No results for input(s): CHOL, HDL, LDLCALC, TRIG, CHOLHDL, LDLDIRECT in the last 72 hours.  Thyroid Function Tests:  Recent Labs  12/08/15 1857  TSH 1.485    Anemia Panel: No results for input(s): VITAMINB12, FOLATE, FERRITIN, TIBC, IRON, RETICCTPCT in the last 72 hours.  Urine analysis:    Component Value Date/Time  COLORURINE ORANGE* 12/06/2015 [place] 12/06/2015 1208   LABSPEC 1.020 12/06/2015 1208   PHURINE 5.0 12/06/2015 1208   GLUCOSEU NEGATIVE 12/06/2015 1208   HGBUR NEGATIVE 12/06/2015 1208   BILIRUBINUR NEGATIVE 12/06/2015 1208   KETONESUR TRACE* 12/06/2015 1208   PROTEINUR 30* 12/06/2015 1208   UROBILINOGEN 0.2 06/30/2013 0543   NITRITE NEGATIVE 12/06/2015 1208   LEUKOCYTESUR NEGATIVE 12/06/2015 1208    Sepsis Labs: Lactic Acid, Venous    Component Value Date/Time   LATICACIDVEN 1.5 12/06/2015 1048    MICROBIOLOGY: Recent Results (from the past 240 hour(s))  Blood culture (routine x 2)     Status: None (Preliminary result)   Collection Time: 12/06/15 10:48 AM  Result Value Ref Range Status   Specimen Description BLOOD RIGHT ANTECUBITAL  Final   Special Requests BOTTLES DRAWN AEROBIC AND ANAEROBIC 4CC  Final   Culture NO GROWTH 2  DAYS  Final   Report Status PENDING  Incomplete  Blood culture (routine x 2)     Status: None (Preliminary result)   Collection Time: 12/06/15 10:49 AM  Result Value Ref Range Status   Specimen Description BLOOD LEFT WRIST  Final   Special Requests   Final    BOTTLES DRAWN AEROBIC AND ANAEROBIC AEB=6CC ANA=4CC   Culture NO GROWTH 2 DAYS  Final   Report Status PENDING  Incomplete  Culture, Urine     Status: Abnormal   Collection Time: 12/06/15 12:10 PM  Result Value Ref Range Status   Specimen Description URINE, CLEAN CATCH  Final   Special Requests NONE  Final   Culture MULTIPLE SPECIES PRESENT, SUGGEST RECOLLECTION (A)  Final   Report Status 12/08/2015 FINAL  Final    RADIOLOGY STUDIES/RESULTS: X-ray Chest Pa And Lateral  12/07/2015  CLINICAL DATA:  Increased shortness of breath, metastatic bladder carcinoma. EXAM: CHEST  2 VIEW COMPARISON:  10/31/2015 FINDINGS: Right IJ power port catheter tip lower SVC. Stable heart size and vascularity. Chronic right apical dense bandlike scarring. No focal pneumonia, collapse or consolidation. No significant edema pattern, or pneumothorax. Small right effusion evident, better demonstrated on 12/01/2015 CT. IMPRESSION: Stable chest exam with exception of a small right pleural effusion. No other superimposed acute process. Electronically Signed   By: Jerilynn Mages.  Shick M.D.   On: 12/07/2015 17:59   Ct Angio Chest Pe W Or Wo Contrast  12/01/2015  CLINICAL DATA:  Shortness of breath, sudden onset today. History of stage IV urothelial cancer. EXAM: CT ANGIOGRAPHY CHEST WITH CONTRAST TECHNIQUE: Multidetector CT imaging of the chest was performed using the standard protocol during bolus administration of intravenous contrast. Multiplanar CT image reconstructions and MIPs were obtained to evaluate the vascular anatomy. CONTRAST:  100 cc Isovue 370 IV COMPARISON:  10/11/2015 FINDINGS: Cardiovascular: No filling defects in the pulmonary arteries to suggest pulmonary  emboli. Respiratory motion obscures the vessels in the lung apices. Dense diffuse coronary artery calcifications. Heart is borderline in size. Aorta is normal caliber with scattered aortic calcifications. Mediastinum/Nodes: Esophagus is fluid-filled and mildly dilated, possibly related to reflux or dysmotility. No mediastinal, hilar, or axillary adenopathy. Lungs/Pleura: Stable 5 mm nodule in the right middle lobe. Small right pleural effusion, increased since prior study. Small nodule at the left lung base measures 6 mm and is stable on image 103. No new or enlarging pulmonary nodules. Biapical scarring noted. Upper Abdomen: Enlarging hepatic masses in the visualized liver. Hepatic lesion on image 143 measures 2.7 cm compared with 6 mm previously. Central right hepatic mass  is partially imaged, measuring approximately 6.7 cm this measured up to 3.3 cm previously. These hepatic lesions are difficult to visualize given the arterial timing for the CTA chest. The liver is not imaged in its entirety. Left adrenal enlargement again noted, unchanged. Musculoskeletal: Chest wall soft tissues are unremarkable. No acute bony abnormality or focal bone lesion. Degenerative changes throughout the thoracic spine. Review of the MIP images confirms the above findings. IMPRESSION: No evidence of pulmonary embolus. Small right pleural effusion, slightly increased since prior study. Partial visualization of the liver demonstrates enlarging hepatic lesions/metastases. Stable small scattered pulmonary nodules as above. Severe diffuse coronary artery disease. Dilated, fluid-filled esophagus, possibly related to reflux or dysmotility. Electronically Signed   By: Rolm Baptise M.D.   On: 12/01/2015 13:40   Ct Abdomen Pelvis W Contrast  12/08/2015  CLINICAL DATA:  Metastatic bladder cancer, abdominal pain EXAM: CT ABDOMEN AND PELVIS WITH CONTRAST TECHNIQUE: Multidetector CT imaging of the abdomen and pelvis was performed using the  standard protocol following bolus administration of intravenous contrast. CONTRAST:  34mL ISOVUE-300 IOPAMIDOL (ISOVUE-300) INJECTION 61% COMPARISON:  CT abdomen pelvis dated 10/11/2015 FINDINGS: Lower chest:  Small right and trace left pleural effusions. Hepatobiliary: Progression of multifocal hepatic metastases. Index lesions on the current study include: -- 1.8 x 2.1 cm lesion in segment 2 (series 8/image 15) -- 2.9 x 3.3 cm lesion in segment 4A (series 8/image 16) --9.6 x 6.6 cm lesion in segment 8 (series 8/ image 23) --9.4 x 6.3 cm lesion in segment 5 (series 8/ image 32), previously 3.0 x 5.1 cm --6.1 x 8.5 cm lesion in segment 6 (series 8/ image 33) Distended gallbladder with irregular wall thickening and intraluminal soft tissue/mass (series 8/images 28, 34, and 39). This appearance is worrisome for metastatic disease to the gallbladder, less likely primary gallbladder neoplasm. No intrahepatic or extrahepatic ductal dilatation. Pancreas: Within normal limits. Spleen: Within normal limits. 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 Possible 1.4 cm inferior right adrenal nodule (series 8/ image 28), indeterminate. Kidneys are within normal limits.  No hydronephrosis. Bladder is underdistended. Stomach/Bowel: Stomach is notable for a small to moderate hiatal hernia. No evidence of bowel obstruction. Appendix is not discretely visualized. Vascular/Lymphatic: Atherosclerotic calcifications of the abdominal aorta and branch vessels. No evidence of abdominal aortic aneurysm. Mildly prominent portacaval node measuring 12 mm short axis (series 8/image 28). Otherwise, no suspicious abdominopelvic lymphadenopathy. Reproductive: Prostate is unremarkable. Other: Moderate abdominopelvic ascites, new. Associated peritoneal disease/ omental caking, particularly beneath the anterior abdominal wall (series 8/ images 29, 38, and 42), with a dominant 2.4 x 1.7 cm implant (series 8/  image 35), new. Additional peritoneal disease in the pelvic cul-de-sac (series 8/ image 81). Musculoskeletal: Degenerative changes of the visualized thoracolumbar spine. Mild superior endplate compression fracture deformities at T12 and L1, unchanged. Moderate central compression fracture deformity at L2. Mild central compression fracture deformity at L3. These findings are unchanged. IMPRESSION: Progression of multifocal hepatic metastases, with index lesions as above, measuring up to 9.6 cm. Suspected metastasis to the gallbladder. Progression of left adrenal metastasis. Possible right adrenal metastasis. Moderate on pelvic ascites, new. Associated peritoneal disease/omental caking, new. Small right and trace left pleural effusions. Additional ancillary findings as above. Electronically Signed   By: Julian Hy M.D.   On: 12/08/2015 14:59     LOS: 2 days   Oren Binet, MD  [facility] Pager:[phone]  If 7PM-7AM, please contact night-coverage [email or web address] Password [facility] 12/09/2015,  9:00 AM

## 2015-12-09 NOTE — Progress Notes (Signed)
PT Cancellation Note  Patient Details Name: Harry Franklin MRN: [id number] DOB: [birth year]   Cancelled Treatment:    Reason Eval/Treat Not Completed: Patient declined, no reason specified (Pt politely declined and stated "I've decided to go with hospice."  Per wishes of the pt, will d/c PT orders at this time. )   Eustaquio Maize Shimeka Bacot, PT, DPT X: [phone]

## 2015-12-09 NOTE — Care Management Note (Signed)
Case Management Note  Patient Details  Name: Harry Franklin MRN: [id number] Date of Birth: 11/26/1940   Expected Discharge Date:    12/09/2015              Expected Discharge Plan:     In-House Referral:     Discharge planning Services     Post Acute Care Choice:    Choice offered to:     DME Arranged:    DME Agency:     HH Arranged:    HH Agency:     Status of Service:     If discussed at [employer] of Stay Meetings, dates discussed:    Additional Comments: Per Hospitalist and patient, patient has elected to go home with hospice services. Patient offered list of hospice providers and would like to use [facility]. Palliative Consult pending. Will began faxing referral to [facility] per patient wishes.  Undra Trembath, Chauncey Reading, RN 12/09/2015, 8:55 AM

## 2015-12-10 MED ORDER — HEPARIN SOD (PORK) LOCK FLUSH 100 UNIT/ML IV SOLN
500.0000 [IU] | Freq: Once | INTRAVENOUS | Status: DC
  Filled 2015-12-10: qty 5

## 2015-12-10 MED ORDER — POTASSIUM CHLORIDE CRYS ER 10 MEQ PO TBCR
10.0000 meq | EXTENDED_RELEASE_TABLET | Freq: Two times a day (BID) | ORAL | Status: DC
  Administered 2015-12-10: 10 meq via ORAL
  Filled 2015-12-10: qty 1

## 2015-12-10 MED ORDER — TRAMADOL HCL 50 MG PO TABS: 50.0000 mg | ORAL_TABLET | Freq: Four times a day (QID) | ORAL | Status: AC | PRN

## 2015-12-10 MED ORDER — DEXAMETHASONE 4 MG PO TABS: 4.0000 mg | ORAL_TABLET | Freq: Three times a day (TID) | ORAL | Status: AC

## 2015-12-10 NOTE — Discharge Summary (Signed)
PATIENT DETAILS Name: Harry Franklin Age: 75 y.o. Sex: male Date of Birth: 1941/04/21 MRN: [id number]. Admitting Physician: Erline Hau, MD [id number] E, MD  Admit Date: 12/07/2015 Discharge date: 12/10/2015  Recommendations for Outpatient Follow-up:  1. Being discharged home with hospice care.  2. Suspect we will slowly need to minimize some of his medications in the near future  PRIMARY DISCHARGE DIAGNOSIS:  Principal Problem:   DOE (dyspnea on exertion) Active Problems:   Malignant neoplasm of urinary bladder (HCC)   Fatigue   Volume depletion   History of DVT (deep vein thrombosis)   Systolic CHF, chronic   Acute on chronic renal failure (HCC)   Metastatic carcinoma of the bladder [facility])   Palliative care encounter      PAST MEDICAL HISTORY: Past Medical History  Diagnosis Date  . GERD (gastroesophageal reflux disease)   . Hypokalemia   . Hypertension   . Cataract   . Arthritis   . Macular degeneration, age related   . DVT (deep venous thrombosis) ([place])     developed after traveling x 1, developed another after left foot surgery  . Hydrocele in adult   . 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 Bone metastases ([place]) 09/21/2015    DISCHARGE MEDICATIONS: Current Discharge Medication List    START taking these medications   Details  dexamethasone (DECADRON) 4 MG tablet Take 1 tablet (4 mg total) by mouth every 8 (eight) hours. Qty: 90 tablet, Refills: 0    traMADol (ULTRAM) 50 MG tablet Take 1 tablet (50 mg total) by mouth every 6 (six) hours as needed for moderate pain or severe pain. Qty: 30 tablet, Refills: 0      CONTINUE these medications which have NOT CHANGED   Details  acetaminophen (TYLENOL) 325 MG tablet Take 650 mg by mouth every 6 (six) hours as needed for mild pain.    atorvastatin (LIPITOR) 80 MG tablet Take 1 tablet (80 mg total) by mouth at bedtime. Qty: 180 tablet, Refills: 3    Calcium  Carb-Cholecalciferol 500-600 MG-UNIT TABS Take 500-600 mg by mouth 2 (two) times daily. Reported on 12/06/2015    carvedilol (COREG) 12.5 MG tablet Take 0.5 tablets (6.25 mg total) by mouth 2 (two) times daily with a meal.    diphenhydrAMINE (SOMINEX) 25 MG tablet Take 25 mg by mouth at bedtime.     furosemide (LASIX) 20 MG tablet Take 20 mg by mouth daily.    lisinopril (PRINIVIL,ZESTRIL) 2.5 MG tablet Take 1 tablet (2.5 mg total) by mouth daily. Qty: 180 tablet, Refills: 3    Multiple Vitamins-Minerals (PRESERVISION AREDS 2 PO) Take 1 tablet by mouth 2 (two) times daily.    omeprazole (PRILOSEC) 40 MG capsule Take 40 mg by mouth daily.     Pembrolizumab (KEYTRUDA IV) Inject into the vein every 21 ( twenty-one) days.    potassium chloride (KLOR-CON) 8 MEQ tablet Take 8 mEq by mouth daily.     Wheat Dextrin (BENEFIBER DRINK MIX PO) Take by mouth daily.     XARELTO 20 MG TABS tablet Take 20 mg by mouth daily with supper.         ALLERGIES:   Allergies  Allergen Reactions  . Neulasta [Pegfilgrastim] Hives    Pt reports breaking out in hives less than 24 hours after taking neulasta. Hives appear on arms and legs.  . Rabeprazole     Other reaction(s): DIARRHEA  . Vancomycin Hives and Rash    BRIEF HPI:  See H&P, Labs, Consult and Test reports for all details in brief,Patient is a 75 y.o. male with metastatic bladder cancer being followed by oncology, history of cardiac arrest secondary to DVT/PE (March 0000000), systolic heart failure admitted with exertional dyspnea, generalized weakness, worsening abdominal swelling and ongoing failure to thrive syndrome  CONSULTATIONS:   hematology/oncology  PERTINENT RADIOLOGIC STUDIES: X-ray Chest Pa And Lateral  12/07/2015  CLINICAL DATA:  Increased shortness of breath, metastatic bladder carcinoma. EXAM: CHEST  2 VIEW COMPARISON:  10/31/2015 FINDINGS: Right IJ power port catheter tip lower SVC. Stable heart size and vascularity. Chronic  right apical dense bandlike scarring. No focal pneumonia, collapse or consolidation. No significant edema pattern, or pneumothorax. Small right effusion evident, better demonstrated on 12/01/2015 CT. IMPRESSION: Stable chest exam with exception of a small right pleural effusion. No other superimposed acute process. Electronically Signed   By: Jerilynn Mages.  Shick M.D.   On: 12/07/2015 17:59   Ct Angio Chest Pe W Or Wo Contrast  12/01/2015  CLINICAL DATA:  Shortness of breath, sudden onset today. History of stage IV urothelial cancer. EXAM: CT ANGIOGRAPHY CHEST WITH CONTRAST TECHNIQUE: Multidetector CT imaging of the chest was performed using the standard protocol during bolus administration of intravenous contrast. Multiplanar CT image reconstructions and MIPs were obtained to evaluate the vascular anatomy. CONTRAST:  100 cc Isovue 370 IV COMPARISON:  10/11/2015 FINDINGS: Cardiovascular: No filling defects in the pulmonary arteries to suggest pulmonary emboli. Respiratory motion obscures the vessels in the lung apices. Dense diffuse coronary artery calcifications. Heart is borderline in size. Aorta is normal caliber with scattered aortic calcifications. Mediastinum/Nodes: Esophagus is fluid-filled and mildly dilated, possibly related to reflux or dysmotility. No mediastinal, hilar, or axillary adenopathy. Lungs/Pleura: Stable 5 mm nodule in the right middle lobe. Small right pleural effusion, increased since prior study. Small nodule at the left lung base measures 6 mm and is stable on image 103. No new or enlarging pulmonary nodules. Biapical scarring noted. Upper Abdomen: Enlarging hepatic masses in the visualized liver. Hepatic lesion on image 143 measures 2.7 cm compared with 6 mm previously. Central right hepatic mass is partially imaged, measuring approximately 6.7 cm this measured up to 3.3 cm previously. These hepatic lesions are difficult to visualize given the arterial timing for the CTA chest. The liver is not  imaged in its entirety. Left adrenal enlargement again noted, unchanged. Musculoskeletal: Chest wall soft tissues are unremarkable. No acute bony abnormality or focal bone lesion. Degenerative changes throughout the thoracic spine. Review of the MIP images confirms the above findings. IMPRESSION: No evidence of pulmonary embolus. Small right pleural effusion, slightly increased since prior study. Partial visualization of the liver demonstrates enlarging hepatic lesions/metastases. Stable small scattered pulmonary nodules as above. Severe diffuse coronary artery disease. Dilated, fluid-filled esophagus, possibly related to reflux or dysmotility. Electronically Signed   By: Rolm Baptise M.D.   On: 12/01/2015 13:40   Ct Abdomen Pelvis W Contrast  12/08/2015  CLINICAL DATA:  Metastatic bladder cancer, abdominal pain EXAM: CT ABDOMEN AND PELVIS WITH CONTRAST TECHNIQUE: Multidetector CT imaging of the abdomen and pelvis was performed using the standard protocol following bolus administration of intravenous contrast. CONTRAST:  57mL ISOVUE-300 IOPAMIDOL (ISOVUE-300) INJECTION 61% COMPARISON:  CT abdomen pelvis dated 10/11/2015 FINDINGS: Lower chest:  Small right and trace left pleural effusions. Hepatobiliary: Progression of multifocal hepatic metastases. Index lesions on the current study include: -- 1.8 x 2.1 cm lesion in segment 2 (series 8/image 15) -- 2.9 x 3.3 cm lesion  in segment 4A (series 8/image 16) --9.6 x 6.6 cm lesion in segment 8 (series 8/ image 23) --9.4 x 6.3 cm lesion in segment 5 (series 8/ image 32), previously 3.0 x 5.1 cm --6.1 x 8.5 cm lesion in segment 6 (series 8/ image 33) Distended gallbladder with irregular wall thickening and intraluminal soft tissue/mass (series 8/images 28, 34, and 39). This appearance is worrisome for metastatic disease to the gallbladder, less likely primary gallbladder neoplasm. No intrahepatic or extrahepatic ductal dilatation. Pancreas: Within normal limits. Spleen:  Within normal limits. 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 Possible 1.4 cm inferior right adrenal nodule (series 8/ image 28), indeterminate. Kidneys are within normal limits.  No hydronephrosis. Bladder is underdistended. Stomach/Bowel: Stomach is notable for a small to moderate hiatal hernia. No evidence of bowel obstruction. Appendix is not discretely visualized. Vascular/Lymphatic: Atherosclerotic calcifications of the abdominal aorta and branch vessels. No evidence of abdominal aortic aneurysm. Mildly prominent portacaval node measuring 12 mm short axis (series 8/image 28). Otherwise, no suspicious abdominopelvic lymphadenopathy. Reproductive: Prostate is unremarkable. Other: Moderate abdominopelvic ascites, new. Associated peritoneal disease/ omental caking, particularly beneath the anterior abdominal wall (series 8/ images 29, 38, and 42), with a dominant 2.4 x 1.7 cm implant (series 8/ image 35), new. Additional peritoneal disease in the pelvic cul-de-sac (series 8/ image 81). Musculoskeletal: Degenerative changes of the visualized thoracolumbar spine. Mild superior endplate compression fracture deformities at T12 and L1, unchanged. Moderate central compression fracture deformity at L2. Mild central compression fracture deformity at L3. These findings are unchanged. IMPRESSION: Progression of multifocal hepatic metastases, with index lesions as above, measuring up to 9.6 cm. Suspected metastasis to the gallbladder. Progression of left adrenal metastasis. Possible right adrenal metastasis. Moderate on pelvic ascites, new. Associated peritoneal disease/omental caking, new. Small right and trace left pleural effusions. Additional ancillary findings as above. Electronically Signed   By: Julian Hy M.D.   On: 12/08/2015 14:59   US Abdomen Limited  12/09/2015  CLINICAL DATA:  75 year old male with history of metastatic bladder cancer. Ascites. EXAM:  LIMITED ABDOMEN ULTRASOUND FOR ASCITES TECHNIQUE: Limited ultrasound survey for ascites was performed in all four abdominal quadrants. COMPARISON:  No priors.  CT the abdomen and pelvis 12/08/2015. FINDINGS: Multiple images of the abdomen demonstrated a small volume of ascites, insufficient to safely perform paracentesis. IMPRESSION: 1. Small volume of ascites. Electronically Signed   By: Vinnie Langton M.D.   On: 12/09/2015 12:25     PERTINENT LAB RESULTS: CBC:  Recent Labs  12/08/15 0500 12/09/15 0516  WBC 10.2 9.4  HGB 8.5* 8.7*  HCT 26.4* 26.7*  PLT 202 228   CMET CMP     Component Value Date/Time   NA 134* 12/09/2015 0516   K 4.3 12/09/2015 0516   CL 103 12/09/2015 0516   CO2 23 12/09/2015 0516   GLUCOSE 121* 12/09/2015 0516   BUN 24* 12/09/2015 0516   CREATININE 1.27* 12/09/2015 0516   CALCIUM 7.4* 12/09/2015 0516   PROT 5.6* 12/08/2015 0500   ALBUMIN 2.0* 12/08/2015 0500   AST 79* 12/08/2015 0500   ALT 44 12/08/2015 0500   ALKPHOS 276* 12/08/2015 0500   BILITOT 1.1 12/08/2015 0500   GFRNONAA 55* 12/09/2015 0516   GFRAA >60 12/09/2015 0516    GFR Estimated Creatinine Clearance: 74.8 mL/min (by C-G formula based on Cr of 1.27). No results for input(s): LIPASE, AMYLASE in the last 72 hours. No results for input(s): CKTOTAL, CKMB, CKMBINDEX,  TROPONINI in the last 72 hours. Invalid input(s): POCBNP No results for input(s): DDIMER in the last 72 hours. No results for input(s): HGBA1C in the last 72 hours. No results for input(s): CHOL, HDL, LDLCALC, TRIG, CHOLHDL, LDLDIRECT in the last 72 hours.  Recent Labs  12/08/15 1857  TSH 1.485   No results for input(s): VITAMINB12, FOLATE, FERRITIN, TIBC, IRON, RETICCTPCT in the last 72 hours. Coags: No results for input(s): INR in the last 72 hours.  Invalid input(s): PT Microbiology: Recent Results (from the past 240 hour(s))  Blood culture (routine x 2)     Status: None (Preliminary result)   Collection Time:  12/06/15 10:48 AM  Result Value Ref Range Status   Specimen Description BLOOD RIGHT ANTECUBITAL  Final   Special Requests BOTTLES DRAWN AEROBIC AND ANAEROBIC 4CC  Final   Culture NO GROWTH 3 DAYS  Final   Report Status PENDING  Incomplete  Blood culture (routine x 2)     Status: None (Preliminary result)   Collection Time: 12/06/15 10:49 AM  Result Value Ref Range Status   Specimen Description BLOOD LEFT WRIST  Final   Special Requests   Final    BOTTLES DRAWN AEROBIC AND ANAEROBIC AEB=6CC ANA=4CC   Culture NO GROWTH 3 DAYS  Final   Report Status PENDING  Incomplete  Culture, Urine     Status: Abnormal   Collection Time: 12/06/15 12:10 PM  Result Value Ref Range Status   Specimen Description URINE, CLEAN CATCH  Final   Special Requests NONE  Final   Culture MULTIPLE SPECIES PRESENT, SUGGEST RECOLLECTION (A)  Final   Report Status 12/08/2015 FINAL  Final     BRIEF HOSPITAL COURSE:  Generalized weakness, failure to thrive syndrome and worsening exertional dyspnea: Suspect due to progression of underlying metastatic bladder cancer, and from anemia of chronic disease. There is no signs of volume overload or signs of infection (blood cultures negative). A.m. cortisol levels appear appropriate therefore doubt adrenal insufficiency. Although a prior echocardiogram on 08/03/15 showed EF around 25%, echo on 12/08/15 showed improvement of EF to around 40%. Evaluated by oncology, recommendations are to proceed with home hospice on discharge.  Active Problems: History of chronic systolic heart failure: Looks euvolemic and exam-sleeping flat. Echocardiogram on 7/19 showed improvement of EF to around 40% (prior echo in March EF was 25%). Weight appears stable, blood pressure has improved somewhat, likely could be restarted on diuretics on discharge.   Acute kidney injury on stage III chronic kidney disease: Creatinine improved following IV fluid hydration-thin him back to usual baseline. Restart  diuretics on discharge.  Anemia: Suspect secondary to chronic kidney disease and underlying malignancy. No overt evidence of blood loss. May be contributing to some of his symptoms of generalized weakness and exertional dyspnea. Follow.  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 Recent CT angiogram chest on 12/01/15 and negative for pulmonary embolism.  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 Oncology evaluation completed, recommendations are to discharge with hospice. Patient now a DO NOT RESUSCITATE. Did have some mild pinkish tint to his urine last evening, urine is clear this morning.  Ascites: Likely malignant-ultrasound abdomen done yesterday revealing only minimal ascites-unable to perform paracentesis. Continue to follow, and pursue paracentesis if ascites is worse. awaiting limited ultrasound abdomen to see if ascites can be drained. Marland Kitchen  Hypertension: Blood pressure appears stable-suspect he could be restarted on Coreg and lisinopril.   History of paroxysmal atrial fibrillation: Chads 2 vascular for at least 2-3, continue beta blocker and Xarelto.  GERD: Continue PPI  Dyslipidemia: Continue statin  Palliative care: Unfortunate 75 year old male presenting with failure to thrive symptoms-further valuation with CT abdomen done this admission showed significant progression of his underlying bladder cancer. Seen by oncology, after discussion with patient and family-recommendations are to pursue hospice on discharge. Patient aware of very poor overall prognoses. He did have some right upper quadrant pain on admission, that is significantly better with tramadol and Decadron. His appetite has increased somewhat as well. He is being discharged with hospice care. DO NOT RESUSCITATE in  place.   TODAY-DAY OF DISCHARGE:  Subjective:   Harry Franklin today has no headache,no chest abdominal pain,no new weakness tingling or numbness, feels much better wants to go home today.   Objective:   Blood pressure 118/67, pulse 76, temperature 97.7 F (36.5 C), temperature source Oral, resp. rate 20, height 6\' 4"  (1.93 m), weight 121.383 kg (267 lb 9.6 oz), SpO2 98 %.  Intake/Output Summary (Last 24 hours) at 12/10/15 0900 Last data filed at 12/10/15 0507  Gross per 24 hour  Intake    240 ml  Output   1200 ml  Net   -960 ml   Filed Weights   12/07/15 1512 12/08/15 0618 12/09/15 0631  Weight: 122.7 kg (270 lb 8.1 oz) 123.1 kg (271 lb 6.2 oz) 121.383 kg (267 lb 9.6 oz)    Exam Awake Alert, Oriented *3, No new F.N deficits, Normal affect Elsa.AT,PERRAL Supple Neck,No JVD, No cervical lymphadenopathy appriciated.  Symmetrical Chest wall movement, Good air movement bilaterally, CTAB RRR,No Gallops,Rubs or new Murmurs, No Parasternal Heave +ve B.Sounds, Abd Soft, Non tender, No organomegaly appriciated, No rebound -guarding or rigidity. No Cyanosis, Clubbing or edema, No new Rash or bruise  DISCHARGE CONDITION: Stable  DISPOSITION: Home with home Hospice  DISCHARGE INSTRUCTIONS:    Activity:  As tolerated with Full fall precautions use walker/cane & assistance as needed  Get Medicines reviewed and adjusted: Please take all your medications with you for your next visit with your Primary MD  Please request your Primary MD to go over all hospital tests and procedure/radiological results at the follow up, please ask your Primary MD to get all Hospital records sent to his/her office.  If you experience worsening of your admission symptoms, develop shortness of breath, life threatening emergency, suicidal or homicidal thoughts you must seek medical attention immediately by calling 911 or calling your MD immediately  if symptoms less severe.  You must read complete  instructions/literature along with all the possible adverse reactions/side effects for all the Medicines you take and that have been prescribed to you. Take any new Medicines after you have completely understood and accpet all the possible adverse reactions/side effects.   Do not drive when taking Pain medications.   Do not take more than prescribed Pain, Sleep and Anxiety Medications  Special Instructions: If you have smoked or chewed Tobacco  in the last 2 yrs please stop smoking, stop any regular Alcohol  and or any Recreational drug use.  Wear Seat belts while driving.  Please note  You were cared for by a hospitalist during your hospital stay. Once you are discharged, your primary care physician will handle any further medical issues. Please note that NO REFILLS for any discharge medications will be authorized once you are discharged,  as it is imperative that you return to your primary care physician (or establish a relationship with a primary care physician if you do not have one) for your aftercare needs so that they can reassess your need for medications and monitor your lab values.   Diet recommendation: Heart Healthy diet   Discharge Instructions    (HEART FAILURE PATIENTS) Call MD:  Anytime you have any of the following symptoms: 1) 3 pound weight gain in 24 hours or 5 pounds in 1 week 2) shortness of breath, with or without a dry hacking cough 3) swelling in the hands, feet or stomach 4) if you have to sleep on extra pillows at night in order to breathe.    Complete by:  As directed      Diet - low sodium heart healthy    Complete by:  As directed      Increase activity slowly    Complete by:  As directed            Follow-up Information    Follow up with BOUSKA,DAVID E, MD. Schedule an appointment as soon as possible for a visit in 1 week.   Specialty:  Family Medicine   Contact information:   [address] [place] [address] [phone]        Follow up with Molli Hazard, MD.   Specialties:  Hematology and Oncology, Oncology   Why:  Hospital follow up, Office will call with date/time, If you dont hear from them,please give them a call   Contact information:   [place] [address] [phone]      Total Time spent on discharge equals 25  minutes.  SignedOren Binet 12/10/2015 9:00 AM

## 2015-12-10 NOTE — Care Management Important Message (Signed)
Important Message  Patient Details  Name: NOBEL SHADE MRN: [id number] Date of Birth: June 10, 1940   Medicare Important Message Given:  Yes    Samier Jaco, Chauncey [place], RN 12/10/2015, 9:05 AM

## 2015-12-10 NOTE — Care Management (Signed)
Patient discharging today home with hospice. [place] notified and DC summary faxed.

## 2015-12-10 NOTE — Progress Notes (Signed)
Pt's ride is here. Dc home via wc

## 2015-12-10 NOTE — Progress Notes (Signed)
Pt a&o x4. Vss. Port deaccessed. Rx given. Medication instructions given. Followup appt gone over. Verbalizes understaning. Hospice to see pt in home at 1230 today. Dc home

## 2015-12-11 LAB — CULTURE, BLOOD (ROUTINE X 2)
CULTURE: NO GROWTH
Culture: NO GROWTH

## 2015-12-13 ENCOUNTER — Ambulatory Visit (INDEPENDENT_AMBULATORY_CARE_PROVIDER_SITE_OTHER): Payer: Medicare Other | Admitting: Cardiology

## 2015-12-13 VITALS — BP 102/64 | HR 95 | Ht 76.0 in | Wt 274.0 lb

## 2015-12-13 DIAGNOSIS — I480 Paroxysmal atrial fibrillation: Secondary | ICD-10-CM

## 2015-12-13 DIAGNOSIS — I5022 Chronic systolic (congestive) heart failure: Secondary | ICD-10-CM | POA: Diagnosis not present

## 2015-12-13 DIAGNOSIS — I2510 Atherosclerotic heart disease of native coronary artery without angina pectoris: Secondary | ICD-10-CM

## 2015-12-13 MED ORDER — CARVEDILOL 3.125 MG PO TABS
3.1250 mg | ORAL_TABLET | Freq: Two times a day (BID) | ORAL | 3 refills | Status: AC
Start: 2015-12-13 — End: 2016-03-12

## 2015-12-13 NOTE — Patient Instructions (Signed)
Medication Instructions:  Decrease coreg 3.125 mg two times daily   Labwork: none  Testing/Procedures: none  Follow-Up: Your physician wants you to follow-up in: 6 months .  You will receive a reminder letter in the mail two months in advance. If you don't receive a letter, please call our office to schedule the follow-up appointment.   Any Other Special Instructions Will Be Listed Below (If Applicable).  If you have any issues related to your hear, please do not hesitate to call us     If you need a refill on your cardiac medications before your next appointment, please call your pharmacy.

## 2015-12-13 NOTE — Progress Notes (Signed)
Clinical Summary Harry Franklin is a 75 y.o.male seen today for follow up of the following medical problems.  1. CAD - admit Jan 2017 with NSTEMI with peak trop 8.68, no EKG changes. Echo with LVEF 40-45% with WMAs primarily involving the apex. No cardiopulmonary symptoms. 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 Given his diffusely metastatic bladder cancer and severe pancytopenia due to recent chemotherapy he was managed medically.  - follow up [place] nuclear stres test on 07/06/15 that showed prior myocardial infarction with minimal peri-infarct ischemia in the apex and in the septal/inferoseptal/inferior wall territories  - denies any chest pain since last visit. Stable SOB/DOE. He did not tolerate high dose of coreg due to dizziness and hypotension. Symptoms improved but still having fairly significant orthostatic dizziness with standing.     2. Afib - new diagnosis during recent admit, was not anticoagulated at that time due to severe pancytopenia and metastatic CA.  - recently blood counts have normalized off chemo, with recent diagnosis of DVT/PE as well he has been started on xarelto  - denies any palpitations since last visit. No bleeding issues on xarelto   3. 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 Has upcoming CT scan later this month - CT 10/11/15 shows progression of metatstic disease including the liver and possibly gallbladder.   - recent admit 11/2015 with failure to thrive, symptomatic ascites. CT scan showed progression of cancer elected for home hospice.   4. DVT/Presumed PE - recent admit with SOB. Patient with PEA arrest. Found to have DVT, presumed possible PE as etiology of arrest.  - IVC filter placed 07/2015, removed 09/2015 - remains on xarelto   5. Chronic sysotlic HF - [id number] LVEF down to 25-30%. Had been 40-45% by echo 06/2015 - diuresed 7 liters during recent admission with 13 lbs weight loss. Discharge weight 267  lbs.  - echo during admission LVEF 40%.  - AKI during admit. Cr at discharge 1.3. From chart review ranges from 1.3-1.7 - denies any SOB or LE edema.    Past Medical History:  Diagnosis Date  . Arthritis   . Bone metastases ([place]) 09/21/2015  . Cataract   . DVT (deep venous thrombosis) ([place])    developed after traveling x 1, developed another after left foot surgery  . GERD (gastroesophageal reflux disease)   . Hydrocele in adult   . Hypertension   . Hypokalemia   . Macular degeneration, age related   . Metastatic urothelial carcinoma ([place]) 04/12/2015     Allergies  Allergen Reactions  . Neulasta [Pegfilgrastim] Hives    Pt reports breaking out in hives less than 24 hours after taking neulasta. Hives appear on arms and legs.  . Rabeprazole     Other reaction(s): DIARRHEA  . Vancomycin Hives and Rash     Current Outpatient Prescriptions  Medication Sig Dispense Refill  . acetaminophen (TYLENOL) 325 MG tablet Take 650 mg by mouth every 6 (six) hours as needed for mild pain.    Marland Kitchen atorvastatin (LIPITOR) 80 MG tablet Take 1 tablet (80 mg total) by mouth at bedtime. 180 tablet 3  . Calcium Carb-Cholecalciferol 500-600 MG-UNIT TABS Take 500-600 mg by mouth 2 (two) times daily. Reported on 12/06/2015    . carvedilol (COREG) 12.5 MG tablet Take 0.5 tablets (6.25 mg total) by mouth 2 (two) times daily with a meal.    . dexamethasone (DECADRON) 4 MG tablet Take 1 tablet (4 mg total) by mouth  every 8 (eight) hours. 90 tablet 0  . diphenhydrAMINE (SOMINEX) 25 MG tablet Take 25 mg by mouth at bedtime.     . furosemide (LASIX) 20 MG tablet Take 20 mg by mouth daily.    Marland Kitchen lisinopril (PRINIVIL,ZESTRIL) 2.5 MG tablet Take 1 tablet (2.5 mg total) by mouth daily. 180 tablet 3  . Multiple Vitamins-Minerals (PRESERVISION AREDS 2 PO) Take 1 tablet by mouth 2 (two) times daily.    Marland Kitchen omeprazole (PRILOSEC) 40 MG capsule Take 40 mg by mouth daily.     . Pembrolizumab (KEYTRUDA IV) Inject into the vein  every 21 ( twenty-one) days.    . potassium chloride (KLOR-CON) 8 MEQ tablet Take 8 mEq by mouth daily.     . traMADol (ULTRAM) 50 MG tablet Take 1 tablet (50 mg total) by mouth every 6 (six) hours as needed for moderate pain or severe pain. 30 tablet 0  . Wheat Dextrin (BENEFIBER DRINK MIX PO) Take by mouth daily.     Alveda Reasons 20 MG TABS tablet Take 20 mg by mouth daily with supper.      No current facility-administered medications for this visit.      Past Surgical History:  Procedure Laterality Date  . APPENDECTOMY    . COLONOSCOPY  03/31/05  . COLONOSCOPY N/A 10/02/2014   [id number] diverticulosis  . ESOPHAGEAL DILATION  01/29/2015   Procedure: ESOPHAGEAL DILATION;  Surgeon: Daneil Dolin, MD;  Location: [facility];  Service: Endoscopy;;  . ESOPHAGOGASTRODUODENOSCOPY N/A 10/02/2014   [id number] ulcerative reflux/s/p dilation/large HH  . ESOPHAGOGASTRODUODENOSCOPY N/A 01/29/2015   Procedure: ESOPHAGOGASTRODUODENOSCOPY (EGD);  Surgeon: Daneil Dolin, MD;  Location: [facility];  Service: Endoscopy;  Laterality: N/A;  0800-rescheduled to 9/9 @ 915 [place] notified pt  . hallux fusion    . HAMMER TOE SURGERY Left   . lapidus procedure    . MALONEY DILATION N/A 10/02/2014   Procedure: Venia Minks DILATION;  Surgeon: Daneil Dolin, MD;  Location: [facility];  Service: Endoscopy;  Laterality: N/A;  . PORTACATH PLACEMENT Left 04/13/15  . REPLACEMENT TOTAL KNEE       Allergies  Allergen Reactions  . Neulasta [Pegfilgrastim] Hives    Pt reports breaking out in hives less than 24 hours after taking neulasta. Hives appear on arms and legs.  . Rabeprazole     Other reaction(s): DIARRHEA  . Vancomycin Hives and Rash      Family History  Problem Relation Age of Onset  . Colon cancer Maternal Uncle   . Heart disease Father      Social History Mr. Raga reports that he quit smoking about 31 years ago. His smoking use included Cigarettes. He has a 31.00 pack-year smoking  history. He has never used smokeless tobacco. Mr. Bate reports that he does not drink alcohol.   Review of Systems CONSTITUTIONAL: No weight loss, fever, chills, weakness or fatigue.  HEENT: Eyes: No visual loss, blurred vision, double vision or yellow sclerae.No hearing loss, sneezing, congestion, runny nose or sore throat.  SKIN: No rash or itching.  CARDIOVASCULAR: per HPI RESPIRATORY: No shortness of breath, cough or sputum.  GASTROINTESTINAL: No anorexia, nausea, vomiting or diarrhea. No abdominal pain or blood.  GENITOURINARY: No burning on urination, no polyuria NEUROLOGICAL: +dizziness MUSCULOSKELETAL: No muscle, back pain, joint pain or stiffness.  LYMPHATICS: No enlarged nodes. No history of splenectomy.  PSYCHIATRIC: No history of depression or anxiety.  ENDOCRINOLOGIC: No reports of sweating, cold or heat intolerance. No polyuria  or polydipsia.  Marland Kitchen   Physical Examination Vitals:   12/13/15 1319  BP: 102/64  Pulse: 95   Vitals:   12/13/15 1319  Weight: 274 lb (124.3 kg)  Height: 6\' 4"  (1.93 m)    Gen: resting comfortably, no acute distress HEENT: no scleral icterus, pupils equal round and reactive, no palptable cervical adenopathy,  CV: RRR, no m/r/g, no jvd Resp: Clear to auscultation bilaterally GI: abdomen is soft, non-tender, non-distended, normal bowel sounds, no hepatosplenomegaly MSK: extremities are warm, no edema.  Skin: warm, no rash Neuro:  no focal deficits Psych: appropriate affect   Diagnostic Studies 06/2015 echo - Left ventricle: The cavity size was at the upper limits of normal. Wall thickness was at the upper limits of normal. Systolic function was mildly to moderately reduced. The estimated ejection fraction was in the range of 40% to 45%. There is severe hypokinesis of the mid-apicalanteroseptal, anterior, inferior, and apical myocardium. Features are consistent with a pseudonormal left ventricular filling pattern, with  concomitant abnormal relaxation and increased filling pressure (grade 2 diastolic dysfunction). - Aortic valve: Mildly calcified annulus. Trileaflet; mildly calcified leaflets. - Mitral valve: There was mild regurgitation. - Left atrium: The atrium was mildly dilated. - Right atrium: Central venous pressure (est): 3 mm Hg. - Tricuspid valve: There was trivial regurgitation. - Pulmonary arteries: PA peak pressure: 19 mm Hg (S). - Pericardium, extracardiac: There was no pericardial effusion.  Impressions:  - Upper normal LV wall thickness and chamber size with LVEF approximately 40-45%. No significant improvement noted in wall motion abnormalities described above in comparison to the recent study. Grade 2 diastolic dysfunction with increased filling pressures. Mild left atrial enlargement. Mild mitral regurgitation. Sclerotic aortic valve without stenosis. Trivial tricuspid regurgitation with PASP 19 mmHg.   Jan 2017 echo Study Conclusions  - Left ventricle: Systolic function was mildly to moderately reduced. The estimated ejection fraction was in the range of 40% to 45%. - Regional wall motion abnormality: Hypokinesis of the mid-apical anterior, basal-mid anteroseptal, mid anterolateral, apical septal, apical lateral, and apical myocardium. - Limited study with echocontrast to evaluate LV function and wall motion.  06/2015 Lexiscan MPI  There was no ST segment deviation noted during stress.  Findings consistent with prior myocardial infarction with peri-infarct ischemia in the apex and in the septal/inferoseptal/inferior wall territories. Both defects are primarily scar with fairly mild peri-infarct ischemia.  This is a high risk study. High risk based on low ejection fraction and multiple defects. There is a fairly minimal amount of myocardium currently at jeopardy.  The left ventricular ejection fraction is moderately decreased  (30-44%).    07/2015 LE [place] Summary:  - Findings consistent with acute deep vein thrombosis involving the  right lower extremity. - Findings consistent with acute deep vein thrombosis involving the  left lower extremity. - No evidence of Baker&'s cyst on the right or left.  07/2015 echo  Study Conclusions  - Left ventricle: The cavity size was normal. There was mild  concentric hypertrophy with moderate focal basal hypertrophy.  Systolic function was severely reduced. The estimated ejection  fraction was in the range of 25% to 30%. There is akinesis of the  mid-apicalanteroseptal and inferoseptal myocardium. The study is  not technically sufficient to allow evaluation of LV diastolic  function. - Aortic valve: Trileaflet; mildly thickened, mildly calcified  leaflets. - Left atrium: The atrium was mildly dilated.     Assessment and Plan  1. CAD - continue medical therapy. We have not  pursued cath due to his metastatic bladder cancer and recent pancytopenia on chemotherapy. Recent CT scan shows cancer progressoion, he has elected for home hospice.  - we will continue current meds.   2. Afib - no recent symptoms  CHADS2Vasc score of 4 continue current meds including xarelto.  3. DVT/PE - management per heme/onc   4. Chronic systolic HF -LVEF somewhat improved by last echo, no significant current CHF symptoms - significant orthostatic symptoms. We will decrease coreg to 3.125mg  bid.    Patient has elected for home hospice care due to his progressing cancer. He will contact us with any significant cardiac needs, we will set a f/u in 6 months.       Arnoldo Lenis, M.D.

## 2015-12-14 ENCOUNTER — Encounter: Payer: Self-pay | Admitting: Cardiology

## 2015-12-19 DIAGNOSIS — R188 Other ascites: Secondary | ICD-10-CM

## 2015-12-21 DEATH — deceased

## 2015-12-24 ENCOUNTER — Encounter: Payer: Self-pay | Admitting: Internal Medicine

## 2015-12-30 ENCOUNTER — Telehealth: Payer: Self-pay | Admitting: Internal Medicine

## 2015-12-30 NOTE — Telephone Encounter (Signed)
Pt wife received recall letter and called to let us know that he passed away in 2022-11-29.

## 2015-12-30 NOTE — Telephone Encounter (Signed)
Noted  

## 2015-12-30 NOTE — Telephone Encounter (Signed)
Noted, routing to [facility]

## 2016-02-01 NOTE — Progress Notes (Signed)
DOS 08.24.2016 Left partial third toe amputation

## 2017-11-16 IMAGING — NM NM MYOCAR MULTI W/SPECT W/WALL MOTION & EF
2 series · 12 of 12 positions shown · non-contrast
Comparison: none

[Series 1: rest · 8.28mm/px · 6 of 64 frames shown]
[frame 6/64]
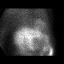
[frame 16/64]
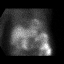
[frame 27/64]
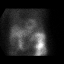
[frame 38/64]
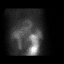
[frame 48/64]
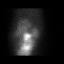
[frame 59/64]
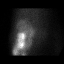

[Series 2: stress gated · 8.28mm/px · 6 of 64 frames shown]
[frame 6/64]
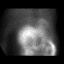
[frame 16/64]
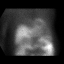
[frame 27/64]
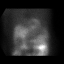
[frame 38/64]
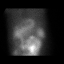
[frame 48/64]
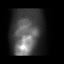
[frame 59/64]
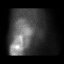

[12 of 12 positions shown; findings below may reference images not displayed]

Canned report from images found in remote index.

Refer to host system for actual result text.

## 2018-03-13 IMAGING — DX DG CHEST 2V
2 series · 2 of 2 positions shown · non-contrast
Comparison: CT of the chest abdomen pelvis dated 10/11/2015

CLINICAL DATA: 74-year-old male with fever. Metastatic urethral
carcinoma. Neri therapy.

EXAM:
CHEST  2 VIEW

[chest pa]
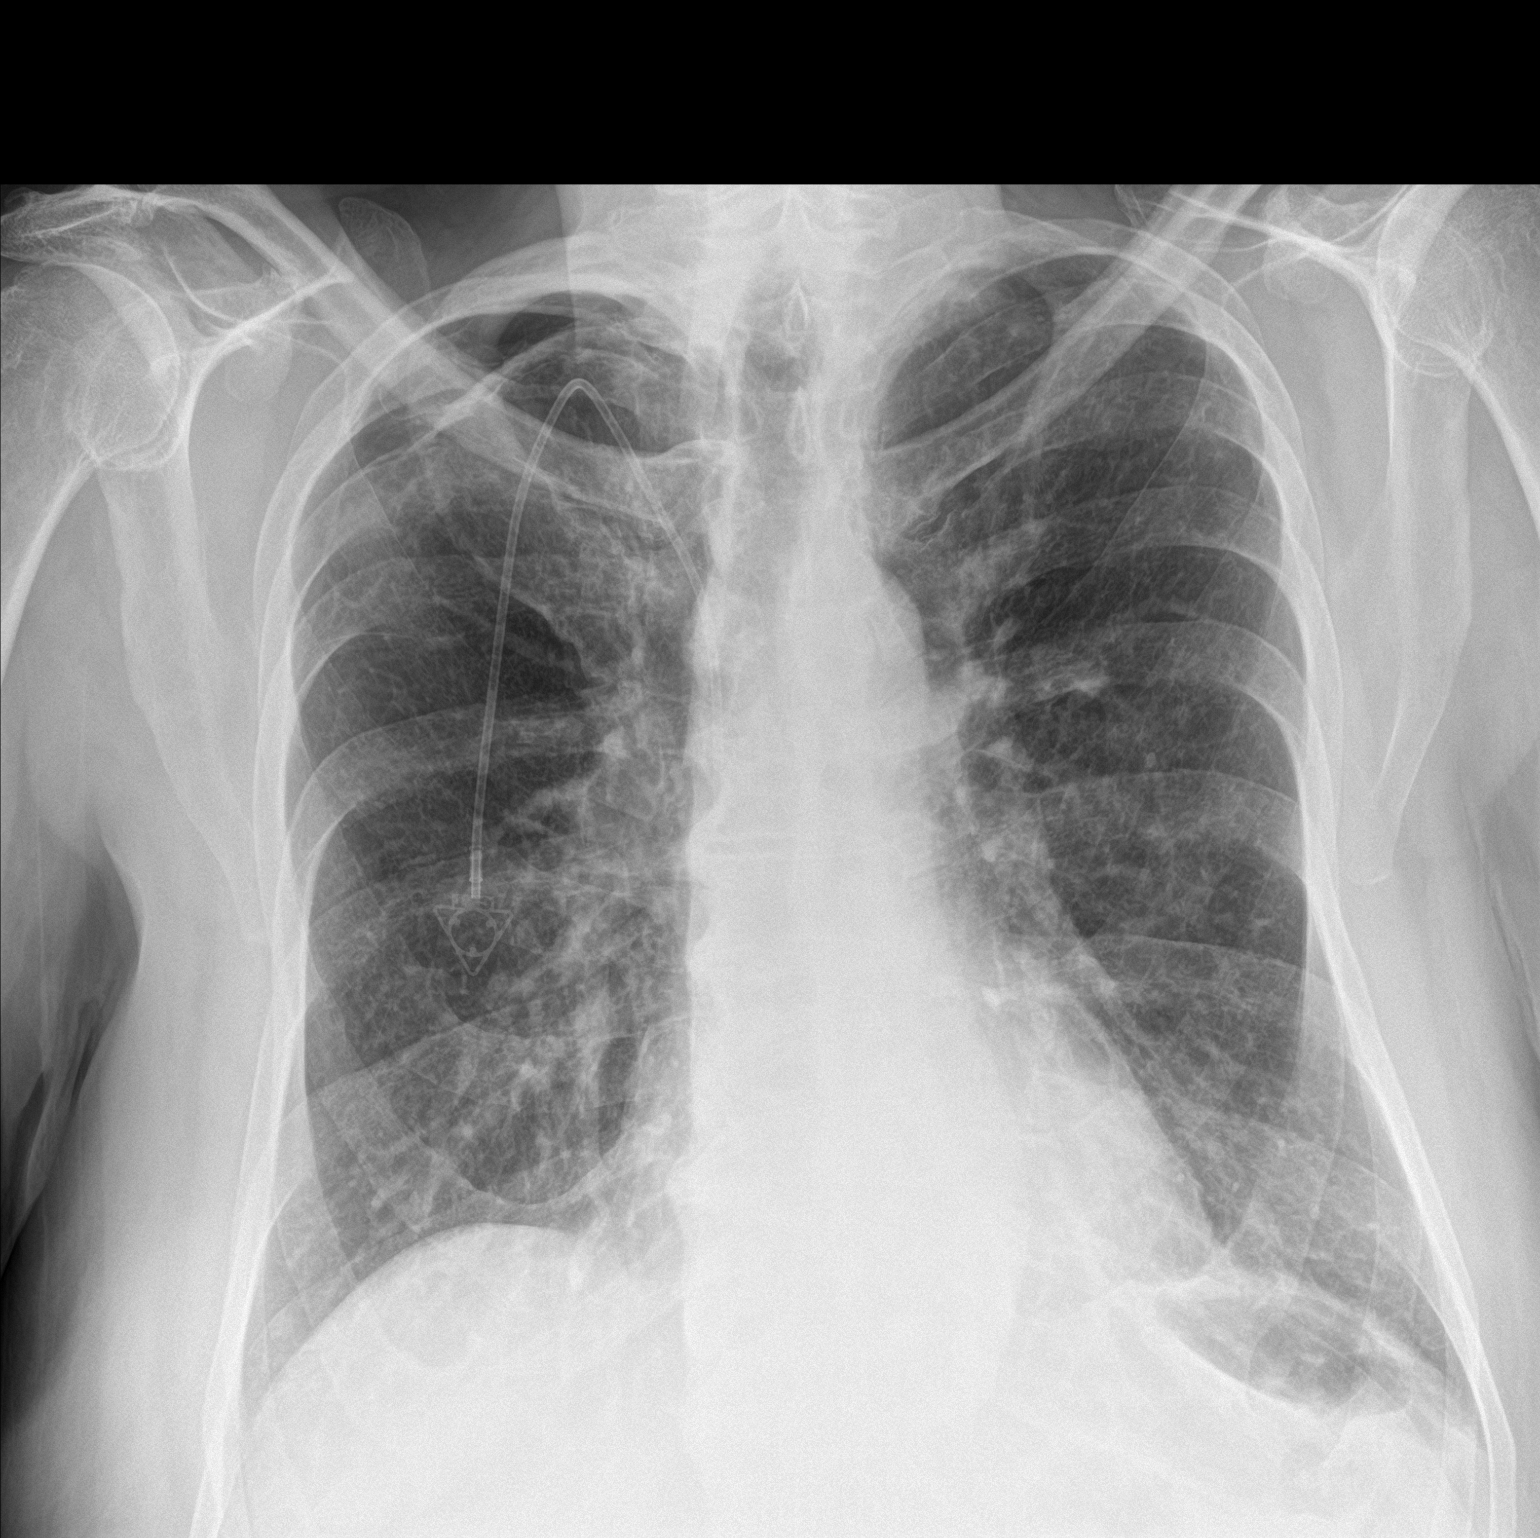

[chest lat]
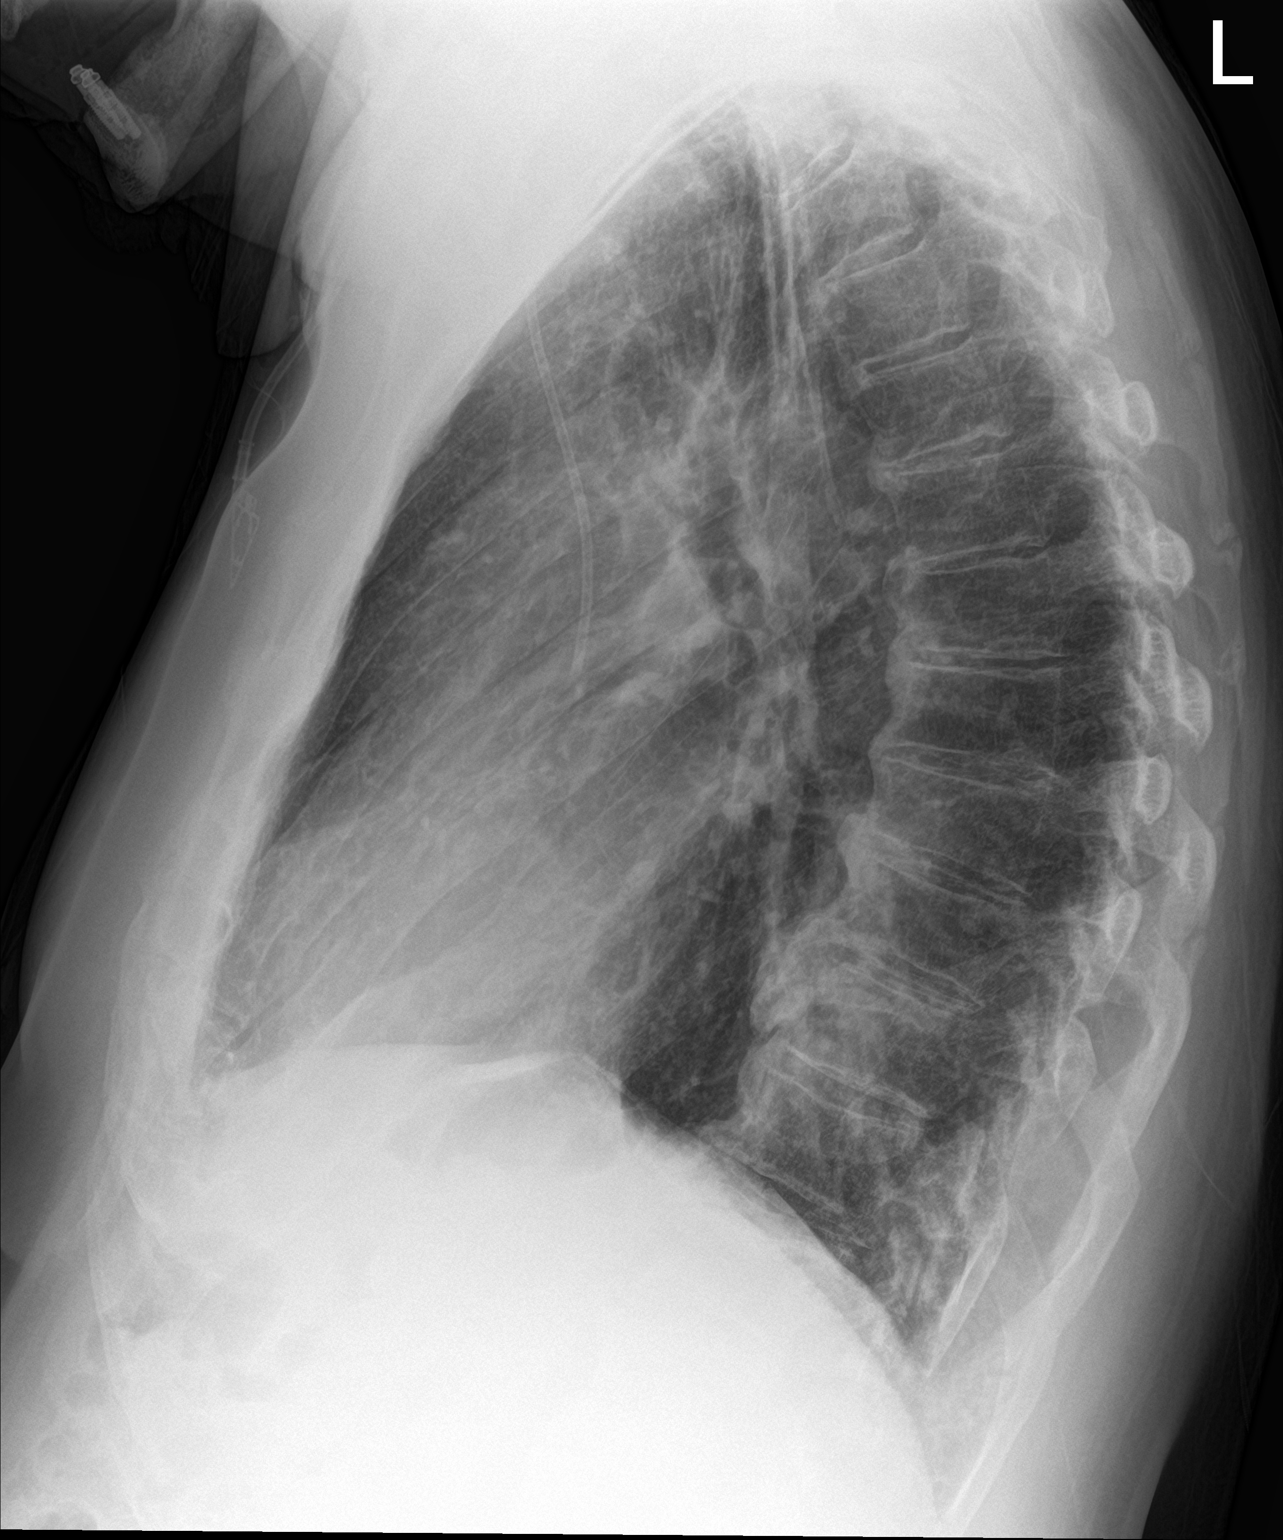

[2 of 2 positions shown; findings below may reference images not displayed]

FINDINGS: Two views of the chest demonstrate mild diffuse interstitial
coarsening, likely chronic. There is no focal consolidation, pleural
effusion, or pneumothorax. Right apical pleural with pleural
parenchymal scarring. Right pectoral Port-A-Cath with tip over the
central SVC. The cardiac silhouette is within normal limits. There
is osteopenia with degenerative changes of the spine. No acute
fracture.
IMPRESSION: No acute cardiopulmonary process.

## 2018-06-21 ENCOUNTER — Other Ambulatory Visit: Payer: Self-pay | Admitting: Nurse Practitioner

## 2018-06-22 ENCOUNTER — Other Ambulatory Visit: Payer: Self-pay | Admitting: Nurse Practitioner

## 2018-06-27 ENCOUNTER — Other Ambulatory Visit: Payer: Self-pay | Admitting: Nurse Practitioner

## 2018-07-06 ENCOUNTER — Other Ambulatory Visit: Payer: Self-pay | Admitting: Nurse Practitioner

## 2021-08-21 ENCOUNTER — Encounter (HOSPITAL_COMMUNITY): Payer: Self-pay | Admitting: Hematology & Oncology

## 2021-09-10 ENCOUNTER — Other Ambulatory Visit: Payer: Self-pay | Admitting: Nurse Practitioner
